# Patient Record
Sex: Female | Born: 1945
Health system: Southern US, Community
[De-identification: ages and names within clinical notes are randomized; demographics above are authoritative.]

## PROBLEM LIST (undated history)

## (undated) DIAGNOSIS — R059 Cough, unspecified: Secondary | ICD-10-CM

## (undated) DIAGNOSIS — Z86718 Personal history of other venous thrombosis and embolism: Secondary | ICD-10-CM

## (undated) DIAGNOSIS — K219 Gastro-esophageal reflux disease without esophagitis: Secondary | ICD-10-CM

## (undated) DIAGNOSIS — I214 Non-ST elevation (NSTEMI) myocardial infarction: Secondary | ICD-10-CM

## (undated) DIAGNOSIS — I2510 Atherosclerotic heart disease of native coronary artery without angina pectoris: Secondary | ICD-10-CM

## (undated) DIAGNOSIS — I200 Unstable angina: Secondary | ICD-10-CM

## (undated) DIAGNOSIS — I10 Essential (primary) hypertension: Secondary | ICD-10-CM

## (undated) DIAGNOSIS — R0600 Dyspnea, unspecified: Secondary | ICD-10-CM

## (undated) DIAGNOSIS — J45909 Unspecified asthma, uncomplicated: Secondary | ICD-10-CM

## (undated) DIAGNOSIS — J449 Chronic obstructive pulmonary disease, unspecified: Secondary | ICD-10-CM

## (undated) DIAGNOSIS — R05 Cough: Secondary | ICD-10-CM

## (undated) HISTORY — PX: ABDOMINAL HYSTERECTOMY: SHX81

---

## 2009-09-26 ENCOUNTER — Emergency Department (HOSPITAL_COMMUNITY): Admission: EM | Admit: 2009-09-26 | Discharge: 2009-09-26 | Payer: Self-pay | Admitting: Emergency Medicine

## 2010-05-15 LAB — CBC
HCT: 46.3 % — ABNORMAL HIGH (ref 36.0–46.0)
Hemoglobin: 15.9 g/dL — ABNORMAL HIGH (ref 12.0–15.0)
MCH: 33.3 pg (ref 26.0–34.0)
MCHC: 34.2 g/dL (ref 30.0–36.0)
MCV: 97.2 fL (ref 78.0–100.0)
Platelets: 217 K/uL (ref 150–400)
RBC: 4.77 MIL/uL (ref 3.87–5.11)
RDW: 13.2 % (ref 11.5–15.5)
WBC: 9.5 10*3/uL (ref 4.0–10.5)

## 2010-05-15 LAB — URINALYSIS, ROUTINE W REFLEX MICROSCOPIC
Bilirubin Urine: NEGATIVE
Glucose, UA: NEGATIVE mg/dL
Nitrite: POSITIVE — AB
Specific Gravity, Urine: >1.03 — ABNORMAL HIGH (ref 1.005–1.030)
Urobilinogen, UA: 0.2 mg/dL (ref 0.0–1.0)
pH: 6 (ref 5.0–8.0)

## 2010-05-15 LAB — DIFFERENTIAL
Basophils Absolute: 0 10*3/uL (ref 0.0–0.1)
Basophils Relative: 0 % (ref 0–1)
Eosinophils Absolute: 0.2 K/uL (ref 0.0–0.7)
Eosinophils Relative: 2 % (ref 0–5)
Lymphocytes Relative: 24 % (ref 12–46)
Lymphs Abs: 2.2 K/uL (ref 0.7–4.0)
Monocytes Absolute: 0.8 10*3/uL (ref 0.1–1.0)
Monocytes Relative: 8 % (ref 3–12)
Neutro Abs: 6.3 K/uL (ref 1.7–7.7)
Neutrophils Relative %: 66 % (ref 43–77)

## 2010-05-15 LAB — POCT I-STAT, CHEM 8
BUN: 14 mg/dL (ref 6–23)
Calcium, Ion: 1.08 mmol/L — ABNORMAL LOW (ref 1.12–1.32)
Chloride: 110 mEq/L (ref 96–112)
Creatinine, Ser: 0.9 mg/dL (ref 0.4–1.2)
Glucose, Bld: 92 mg/dL (ref 70–99)
HCT: 49 % — ABNORMAL HIGH (ref 36.0–46.0)
Hemoglobin: 16.7 g/dL — ABNORMAL HIGH (ref 12.0–15.0)
Potassium: 4 meq/L (ref 3.5–5.1)
Sodium: 143 mEq/L (ref 135–145)
TCO2: 25 mmol/L (ref 0–100)

## 2010-05-15 LAB — URINE CULTURE
Colony Count: >=100000
Culture  Setup Time: 201107302118

## 2010-05-15 LAB — URINE MICROSCOPIC-ADD ON

## 2015-04-20 DIAGNOSIS — Z6828 Body mass index (BMI) 28.0-28.9, adult: Secondary | ICD-10-CM | POA: Diagnosis not present

## 2015-04-20 DIAGNOSIS — K219 Gastro-esophageal reflux disease without esophagitis: Secondary | ICD-10-CM | POA: Diagnosis not present

## 2015-04-20 DIAGNOSIS — Z720 Tobacco use: Secondary | ICD-10-CM | POA: Diagnosis not present

## 2015-04-20 DIAGNOSIS — J45909 Unspecified asthma, uncomplicated: Secondary | ICD-10-CM | POA: Diagnosis not present

## 2015-07-09 DIAGNOSIS — Z7189 Other specified counseling: Secondary | ICD-10-CM | POA: Diagnosis not present

## 2015-07-09 DIAGNOSIS — Z0000 Encounter for general adult medical examination without abnormal findings: Secondary | ICD-10-CM | POA: Diagnosis not present

## 2015-07-09 DIAGNOSIS — F17210 Nicotine dependence, cigarettes, uncomplicated: Secondary | ICD-10-CM | POA: Diagnosis not present

## 2015-07-09 DIAGNOSIS — Z6827 Body mass index (BMI) 27.0-27.9, adult: Secondary | ICD-10-CM | POA: Diagnosis not present

## 2015-07-09 DIAGNOSIS — Z720 Tobacco use: Secondary | ICD-10-CM | POA: Diagnosis not present

## 2015-07-09 DIAGNOSIS — R5383 Other fatigue: Secondary | ICD-10-CM | POA: Diagnosis not present

## 2015-07-09 DIAGNOSIS — Z299 Encounter for prophylactic measures, unspecified: Secondary | ICD-10-CM | POA: Diagnosis not present

## 2015-07-09 DIAGNOSIS — Z1389 Encounter for screening for other disorder: Secondary | ICD-10-CM | POA: Diagnosis not present

## 2015-07-09 DIAGNOSIS — Z79899 Other long term (current) drug therapy: Secondary | ICD-10-CM | POA: Diagnosis not present

## 2015-07-15 DIAGNOSIS — Z299 Encounter for prophylactic measures, unspecified: Secondary | ICD-10-CM | POA: Diagnosis not present

## 2015-07-15 DIAGNOSIS — J449 Chronic obstructive pulmonary disease, unspecified: Secondary | ICD-10-CM | POA: Diagnosis not present

## 2015-07-15 DIAGNOSIS — F17210 Nicotine dependence, cigarettes, uncomplicated: Secondary | ICD-10-CM | POA: Diagnosis not present

## 2015-07-15 DIAGNOSIS — Z720 Tobacco use: Secondary | ICD-10-CM | POA: Diagnosis not present

## 2015-07-15 DIAGNOSIS — E8809 Other disorders of plasma-protein metabolism, not elsewhere classified: Secondary | ICD-10-CM | POA: Diagnosis not present

## 2015-08-17 DIAGNOSIS — J441 Chronic obstructive pulmonary disease with (acute) exacerbation: Secondary | ICD-10-CM | POA: Diagnosis not present

## 2015-08-17 DIAGNOSIS — Z299 Encounter for prophylactic measures, unspecified: Secondary | ICD-10-CM | POA: Diagnosis not present

## 2015-08-17 DIAGNOSIS — B3781 Candidal esophagitis: Secondary | ICD-10-CM | POA: Diagnosis not present

## 2015-08-17 DIAGNOSIS — J449 Chronic obstructive pulmonary disease, unspecified: Secondary | ICD-10-CM | POA: Diagnosis not present

## 2015-08-17 DIAGNOSIS — Z720 Tobacco use: Secondary | ICD-10-CM | POA: Diagnosis not present

## 2015-09-16 DIAGNOSIS — F17210 Nicotine dependence, cigarettes, uncomplicated: Secondary | ICD-10-CM | POA: Diagnosis not present

## 2015-09-16 DIAGNOSIS — Z720 Tobacco use: Secondary | ICD-10-CM | POA: Diagnosis not present

## 2015-09-16 DIAGNOSIS — Z299 Encounter for prophylactic measures, unspecified: Secondary | ICD-10-CM | POA: Diagnosis not present

## 2015-09-16 DIAGNOSIS — J449 Chronic obstructive pulmonary disease, unspecified: Secondary | ICD-10-CM | POA: Diagnosis not present

## 2015-11-27 ENCOUNTER — Encounter (HOSPITAL_COMMUNITY): Payer: Self-pay | Admitting: Emergency Medicine

## 2015-11-27 ENCOUNTER — Emergency Department (HOSPITAL_COMMUNITY)
Admission: EM | Admit: 2015-11-27 | Discharge: 2015-11-27 | Disposition: A | Discharge status: Home / Self Care | Payer: MEDICARE | Attending: Emergency Medicine | Admitting: Emergency Medicine

## 2015-11-27 DIAGNOSIS — T63441A Toxic effect of venom of bees, accidental (unintentional), initial encounter: Secondary | ICD-10-CM | POA: Diagnosis not present

## 2015-11-27 DIAGNOSIS — F17210 Nicotine dependence, cigarettes, uncomplicated: Secondary | ICD-10-CM | POA: Diagnosis not present

## 2015-11-27 DIAGNOSIS — T63481A Toxic effect of venom of other arthropod, accidental (unintentional), initial encounter: Secondary | ICD-10-CM

## 2015-11-27 MED ORDER — HYDROXYZINE HCL 25 MG PO TABS: 25.0000 mg | ORAL_TABLET | Freq: Four times a day (QID) | ORAL | 0 refills | Status: DC | PRN

## 2015-11-27 NOTE — ED Triage Notes (Signed)
Pt states she got an insect bite while working in the garden yesterday. Area was approx quarter size last night. Redness has spread today and pt is c/o lightheadedness and nausea.

## 2015-11-27 NOTE — ED Provider Notes (Signed)
[facility]-EM[facility]Y DEPT Provider Note   CSN: [id number] Arrival date & time: 11/27/15  2300  By signing my name below, I, Linna Darner, attest that this documentation has been prepared under the direction and in the presence of physician practitioner, Gilda Crease, MD. Electronically Signed: Linna Darner, Scribe. 11/27/2015. 11:16 PM.  History   Chief Complaint Chief Complaint  Patient presents with  . Insect Bite    The history is provided by the patient. No language interpreter was used.     HPI Comments: Lisa [name] is a 70 y.o. female who presents to the Emergency Department complaining of insect bite sustained to her right lower abdomen yesterday. Pt reports she was working in her garden and felt an insect bite on her right lower abdomen. She states she did not see the insect. Pt reports she initially had redness that was about the size of a quarter that has spread significantly since onset. She states the area is painful to the touch. Pt reports she has tried benadryl, peroxide, and an antibiotic cream with no relief of her symptoms. She reports a h/o swelling after being stung by yellow jackets in the past but has no other known allergic reactions to insects. Pt denies SOB, numbness, weakness, or any other associated symptoms.  History reviewed. No pertinent past medical history.  There are no active problems to display for this patient.   Past Surgical History:  Procedure Laterality Date  . ABDOMINAL HYSTERECTOMY      OB History    Gravida Para Term Preterm AB Living   7 6 6   1 4    SAB TAB Ectopic Multiple Live Births   1               Home Medications    Prior to Admission medications   Medication Sig Start Date End Date Taking? Authorizing Provider  hydrOXYzine (ATARAX/VISTARIL) 25 MG tablet Take 1 tablet (25 mg total) by mouth every 6 (six) hours as needed for itching. 11/27/15   Gilda Crease, MD    Family History Family History    Problem Relation Age of Onset  . Asthma Mother   . Heart attack Father   . Cancer Other     Social History Social History  Substance Use Topics  . Smoking status: Current Every Day Smoker    Packs/day: 0.50    Types: Cigarettes  . Smokeless tobacco: Never Used  . Alcohol use Yes     Comment: occas     Allergies   Penicillins and Sulfa antibiotics   Review of Systems Review of Systems  Respiratory: Negative for shortness of breath.   Skin: Positive for color change (redness) and wound (insect bite to right abdomen).  Neurological: Negative for weakness and numbness.  All other systems reviewed and are negative.   Physical Exam Updated Vital Signs BP (!) 208/98 (BP Location: Right Arm)   Pulse 68   Temp 97.7 F (36.5 C) (Oral)   Resp 18   Ht 5\' 7"  (1.702 m)   Wt 171 lb (77.6 kg)   SpO2 96%   BMI 26.78 kg/m   Physical Exam  Constitutional: She is oriented to person, place, and time. She appears well-developed and well-nourished. No distress.  HENT:  Head: Normocephalic and atraumatic.  Right Ear: Hearing normal.  Left Ear: Hearing normal.  Nose: Nose normal.  Mouth/Throat: Oropharynx is clear and moist and mucous membranes are normal.  Eyes: Conjunctivae and EOM are normal. Pupils are equal,  round, and reactive to light.  Neck: Normal range of motion. Neck supple.  Cardiovascular: Regular rhythm, S1 normal and S2 normal.  Exam reveals no gallop and no friction rub.   No murmur heard. Pulmonary/Chest: Effort normal and breath sounds normal. No respiratory distress. She exhibits no tenderness.  Abdominal: Soft. Normal appearance and bowel sounds are normal. There is no hepatosplenomegaly. There is no tenderness. There is no rebound, no guarding, no tenderness at McBurney's point and negative Murphy's sign. No hernia.  Musculoskeletal: Normal range of motion.  Neurological: She is alert and oriented to person, place, and time. She has normal strength. No cranial  nerve deficit or sensory deficit. Coordination normal. GCS eye subscore is 4. GCS verbal subscore is 5. GCS motor subscore is 6.  Skin: Skin is warm, dry and intact. No rash noted. No cyanosis.  5 x 8 cm oval-shaped, well circumscribed, non-raised area of erythema and warmth to the right mid-abdomen.  Psychiatric: She has a normal mood and affect. Her speech is normal and behavior is normal. Thought content normal.  Nursing note and vitals reviewed.    ED Treatments / Results  Labs (all labs ordered are listed, but only abnormal results are displayed) Labs Reviewed - No data to display  EKG  EKG Interpretation None       Radiology No results found.  Procedures Procedures (including critical care time)  DIAGNOSTIC STUDIES: Oxygen Saturation is 96% on RA, adequate by my interpretation.    COORDINATION OF CARE: 11:20 PM Discussed treatment plan with pt at bedside and pt agreed to plan.  Medications Ordered in ED Medications - No data to display   Initial Impression / Assessment and Plan / ED Course  I have reviewed the triage vital signs and the nursing notes.  Pertinent labs & imaging results that were available during my care of the patient were reviewed by me and considered in my medical decision making (see chart for details).  Clinical Course   Patient presents to the ER for evaluation of possible bug bite or sting. She reports that she was working in the garden felt a sudden burning pain in her right side. There was a small bump there and immediately but since then she has had expanding redness around the site. Examination reveals some erythema and warmth to the site that is consistent with a local reaction. This does not resemble cellulitis. Patient reassured, topical care and Benadryl or Vistaril for itching.  Patient was noted to be hypertensive here. She reports that her primary doctor is watching this. She is keeping a daily journal of her blood pressures. She  reports that her pressure goes up when she is under stress. She is stressed about this issue today, but also lost her grandson earlier in the week and this is causing her great stress as well. She is not symptomatically therefore no initiation of treatment will be made. She is to follow-up with her primary doctor as scheduled for recheck of her pressures.  I personally performed the services described in this documentation, which was scribed in my presence. The recorded information has been reviewed and is accurate.   Final Clinical Impressions(s) / ED Diagnoses   Final diagnoses:  Insect sting, accidental or unintentional, initial encounter    New Prescriptions New Prescriptions   HYDROXYZINE (ATARAX/VISTARIL) 25 MG TABLET    Take 1 tablet (25 mg total) by mouth every 6 (six) hours as needed for itching.     Gilda Creasehristopher J Jahsir Rama, MD  11/27/15 2334  

## 2015-12-25 DIAGNOSIS — J449 Chronic obstructive pulmonary disease, unspecified: Secondary | ICD-10-CM | POA: Diagnosis not present

## 2015-12-25 DIAGNOSIS — Z23 Encounter for immunization: Secondary | ICD-10-CM | POA: Diagnosis not present

## 2015-12-25 DIAGNOSIS — Z720 Tobacco use: Secondary | ICD-10-CM | POA: Diagnosis not present

## 2015-12-25 DIAGNOSIS — F17200 Nicotine dependence, unspecified, uncomplicated: Secondary | ICD-10-CM | POA: Diagnosis not present

## 2016-01-13 DIAGNOSIS — Z299 Encounter for prophylactic measures, unspecified: Secondary | ICD-10-CM | POA: Diagnosis not present

## 2016-01-13 DIAGNOSIS — J449 Chronic obstructive pulmonary disease, unspecified: Secondary | ICD-10-CM | POA: Diagnosis not present

## 2016-01-13 DIAGNOSIS — J441 Chronic obstructive pulmonary disease with (acute) exacerbation: Secondary | ICD-10-CM | POA: Diagnosis not present

## 2016-08-17 DIAGNOSIS — F17210 Nicotine dependence, cigarettes, uncomplicated: Secondary | ICD-10-CM | POA: Diagnosis not present

## 2016-08-17 DIAGNOSIS — J449 Chronic obstructive pulmonary disease, unspecified: Secondary | ICD-10-CM | POA: Diagnosis not present

## 2016-08-17 DIAGNOSIS — Z6828 Body mass index (BMI) 28.0-28.9, adult: Secondary | ICD-10-CM | POA: Diagnosis not present

## 2016-08-17 DIAGNOSIS — Z299 Encounter for prophylactic measures, unspecified: Secondary | ICD-10-CM | POA: Diagnosis not present

## 2016-08-17 DIAGNOSIS — K589 Irritable bowel syndrome without diarrhea: Secondary | ICD-10-CM | POA: Diagnosis not present

## 2016-08-23 DIAGNOSIS — M549 Dorsalgia, unspecified: Secondary | ICD-10-CM | POA: Diagnosis not present

## 2016-08-23 DIAGNOSIS — F17200 Nicotine dependence, unspecified, uncomplicated: Secondary | ICD-10-CM | POA: Diagnosis not present

## 2016-08-23 DIAGNOSIS — Z79899 Other long term (current) drug therapy: Secondary | ICD-10-CM | POA: Diagnosis not present

## 2016-08-23 DIAGNOSIS — Z801 Family history of malignant neoplasm of trachea, bronchus and lung: Secondary | ICD-10-CM | POA: Diagnosis not present

## 2016-08-23 DIAGNOSIS — K589 Irritable bowel syndrome without diarrhea: Secondary | ICD-10-CM | POA: Diagnosis not present

## 2016-08-23 DIAGNOSIS — R202 Paresthesia of skin: Secondary | ICD-10-CM | POA: Diagnosis not present

## 2016-08-23 DIAGNOSIS — Z880 Allergy status to penicillin: Secondary | ICD-10-CM | POA: Diagnosis not present

## 2016-08-23 DIAGNOSIS — Z883 Allergy status to other anti-infective agents status: Secondary | ICD-10-CM | POA: Diagnosis not present

## 2016-08-23 DIAGNOSIS — R0789 Other chest pain: Secondary | ICD-10-CM | POA: Diagnosis not present

## 2016-08-23 DIAGNOSIS — Z882 Allergy status to sulfonamides status: Secondary | ICD-10-CM | POA: Diagnosis not present

## 2016-08-23 DIAGNOSIS — K219 Gastro-esophageal reflux disease without esophagitis: Secondary | ICD-10-CM | POA: Diagnosis not present

## 2016-08-23 DIAGNOSIS — R079 Chest pain, unspecified: Secondary | ICD-10-CM | POA: Diagnosis not present

## 2016-08-23 DIAGNOSIS — Z8249 Family history of ischemic heart disease and other diseases of the circulatory system: Secondary | ICD-10-CM | POA: Diagnosis not present

## 2016-08-23 DIAGNOSIS — Z299 Encounter for prophylactic measures, unspecified: Secondary | ICD-10-CM | POA: Diagnosis not present

## 2016-08-23 DIAGNOSIS — R61 Generalized hyperhidrosis: Secondary | ICD-10-CM | POA: Diagnosis not present

## 2016-08-23 DIAGNOSIS — J449 Chronic obstructive pulmonary disease, unspecified: Secondary | ICD-10-CM | POA: Diagnosis not present

## 2016-08-23 DIAGNOSIS — R110 Nausea: Secondary | ICD-10-CM | POA: Diagnosis not present

## 2016-08-23 DIAGNOSIS — Z7951 Long term (current) use of inhaled steroids: Secondary | ICD-10-CM | POA: Diagnosis not present

## 2016-08-23 DIAGNOSIS — Z6828 Body mass index (BMI) 28.0-28.9, adult: Secondary | ICD-10-CM | POA: Diagnosis not present

## 2016-08-24 DIAGNOSIS — R0789 Other chest pain: Secondary | ICD-10-CM | POA: Diagnosis not present

## 2016-08-24 DIAGNOSIS — R110 Nausea: Secondary | ICD-10-CM | POA: Diagnosis not present

## 2016-08-24 DIAGNOSIS — R202 Paresthesia of skin: Secondary | ICD-10-CM | POA: Diagnosis not present

## 2016-08-24 DIAGNOSIS — M549 Dorsalgia, unspecified: Secondary | ICD-10-CM | POA: Diagnosis not present

## 2016-08-24 DIAGNOSIS — R61 Generalized hyperhidrosis: Secondary | ICD-10-CM | POA: Diagnosis not present

## 2016-08-24 DIAGNOSIS — F17200 Nicotine dependence, unspecified, uncomplicated: Secondary | ICD-10-CM | POA: Diagnosis not present

## 2016-08-25 DIAGNOSIS — M549 Dorsalgia, unspecified: Secondary | ICD-10-CM | POA: Diagnosis not present

## 2016-08-25 DIAGNOSIS — R202 Paresthesia of skin: Secondary | ICD-10-CM | POA: Diagnosis not present

## 2016-08-25 DIAGNOSIS — R0789 Other chest pain: Secondary | ICD-10-CM | POA: Diagnosis not present

## 2016-08-25 DIAGNOSIS — R110 Nausea: Secondary | ICD-10-CM | POA: Diagnosis not present

## 2016-08-25 DIAGNOSIS — R61 Generalized hyperhidrosis: Secondary | ICD-10-CM | POA: Diagnosis not present

## 2016-08-25 DIAGNOSIS — F17200 Nicotine dependence, unspecified, uncomplicated: Secondary | ICD-10-CM | POA: Diagnosis not present

## 2016-08-28 DIAGNOSIS — I200 Unstable angina: Secondary | ICD-10-CM

## 2016-08-28 HISTORY — DX: Unstable angina: I20.0

## 2016-08-31 ENCOUNTER — Inpatient Hospital Stay (HOSPITAL_COMMUNITY)
Admission: AD | Admit: 2016-08-31 | Discharge: 2016-09-06 | DRG: 247 | Disposition: A | Discharge status: Home / Self Care | Payer: MEDICARE | Source: Other Acute Inpatient Hospital | Attending: Internal Medicine | Admitting: Internal Medicine

## 2016-08-31 DIAGNOSIS — F17210 Nicotine dependence, cigarettes, uncomplicated: Secondary | ICD-10-CM | POA: Diagnosis not present

## 2016-08-31 DIAGNOSIS — Z882 Allergy status to sulfonamides status: Secondary | ICD-10-CM

## 2016-08-31 DIAGNOSIS — F17200 Nicotine dependence, unspecified, uncomplicated: Secondary | ICD-10-CM | POA: Diagnosis not present

## 2016-08-31 DIAGNOSIS — I25110 Atherosclerotic heart disease of native coronary artery with unstable angina pectoris: Secondary | ICD-10-CM | POA: Diagnosis present

## 2016-08-31 DIAGNOSIS — Z720 Tobacco use: Secondary | ICD-10-CM | POA: Diagnosis not present

## 2016-08-31 DIAGNOSIS — I200 Unstable angina: Secondary | ICD-10-CM

## 2016-08-31 DIAGNOSIS — Z955 Presence of coronary angioplasty implant and graft: Secondary | ICD-10-CM

## 2016-08-31 DIAGNOSIS — R053 Chronic cough: Secondary | ICD-10-CM | POA: Diagnosis present

## 2016-08-31 DIAGNOSIS — R0789 Other chest pain: Secondary | ICD-10-CM | POA: Diagnosis not present

## 2016-08-31 DIAGNOSIS — K219 Gastro-esophageal reflux disease without esophagitis: Secondary | ICD-10-CM | POA: Diagnosis not present

## 2016-08-31 DIAGNOSIS — Z7982 Long term (current) use of aspirin: Secondary | ICD-10-CM

## 2016-08-31 DIAGNOSIS — J439 Emphysema, unspecified: Secondary | ICD-10-CM | POA: Diagnosis not present

## 2016-08-31 DIAGNOSIS — R079 Chest pain, unspecified: Secondary | ICD-10-CM

## 2016-08-31 DIAGNOSIS — I214 Non-ST elevation (NSTEMI) myocardial infarction: Secondary | ICD-10-CM | POA: Diagnosis not present

## 2016-08-31 DIAGNOSIS — R0602 Shortness of breath: Secondary | ICD-10-CM | POA: Diagnosis not present

## 2016-08-31 DIAGNOSIS — J449 Chronic obstructive pulmonary disease, unspecified: Secondary | ICD-10-CM | POA: Diagnosis not present

## 2016-08-31 DIAGNOSIS — E876 Hypokalemia: Secondary | ICD-10-CM

## 2016-08-31 DIAGNOSIS — R062 Wheezing: Secondary | ICD-10-CM | POA: Diagnosis not present

## 2016-08-31 DIAGNOSIS — Z79899 Other long term (current) drug therapy: Secondary | ICD-10-CM | POA: Diagnosis not present

## 2016-08-31 DIAGNOSIS — R05 Cough: Secondary | ICD-10-CM | POA: Diagnosis present

## 2016-08-31 DIAGNOSIS — Z880 Allergy status to penicillin: Secondary | ICD-10-CM

## 2016-08-31 DIAGNOSIS — E785 Hyperlipidemia, unspecified: Secondary | ICD-10-CM | POA: Diagnosis not present

## 2016-08-31 HISTORY — DX: Dyspnea, unspecified: R06.00

## 2016-08-31 HISTORY — DX: Cough: R05

## 2016-08-31 HISTORY — DX: Unstable angina: I20.0

## 2016-08-31 HISTORY — DX: Chronic obstructive pulmonary disease, unspecified: J44.9

## 2016-08-31 HISTORY — DX: Non-ST elevation (NSTEMI) myocardial infarction: I21.4

## 2016-08-31 HISTORY — DX: Cough, unspecified: R05.9

## 2016-08-31 HISTORY — DX: Atherosclerotic heart disease of native coronary artery without angina pectoris: I25.10

## 2016-08-31 HISTORY — DX: Gastro-esophageal reflux disease without esophagitis: K21.9

## 2016-08-31 MED ORDER — ASPIRIN EC 81 MG PO TBEC: 81.0000 mg | DELAYED_RELEASE_TABLET | Freq: Every day | ORAL | Status: DC

## 2016-08-31 MED ORDER — ATORVASTATIN CALCIUM 80 MG PO TABS
80.0000 mg | ORAL_TABLET | Freq: Every day | ORAL | Status: DC
  Administered 2016-09-01 – 2016-09-05 (×5): 80 mg via ORAL
  Filled 2016-08-31 (×5): qty 1

## 2016-08-31 MED ORDER — SODIUM CHLORIDE 0.9% FLUSH
3.0000 mL | Freq: Two times a day (BID) | INTRAVENOUS | Status: DC
  Administered 2016-09-01 – 2016-09-04 (×5): 3 mL via INTRAVENOUS

## 2016-08-31 NOTE — H&P (Signed)
CARDIOLOGY INPATIENT HISTORY AND PHYSICAL EXAMINATION NOTE  Patient ID: Lisa [name] MRN: [id number], DOB/AGE: January 25, 1946   Admit date: 08/31/2016   Primary Physician: Ignatius Specking, MD Primary Cardiologist: new  Reason for admission: chest pain  HPI: This is a 72 y.o. Caucasian female with no known history of CAD but has h/o COPD and tobacco use 60 pack years who presented with chest pain to [facility] hospital. Patient was in his usual state of health when she developed chest pain while she was working outside in the year 1 week ago. She was sent by her PCP to ED where she was ruled out for ACS. She was scheduled for stress test on coming Monday however she had 2 more episodes of chest pains at home and today she presented again to [facility]. The stress machine at [facility] is out of work so they transferred the patient to [facility] for stress test in the AM.  CP lasted for 45 minutes.substernal in location, no radiation, 10/10 in intensity, occurred at rest this time right after eating lunch around 2 pm today. Initial trop was negative. ECG showed T wave inversions in the V1-V3. Pt was hemodynamically stable. No h/o PE/DVT, nausea/vomiting, abdominal pain, CHF symptoms.   Problem List: No past medical history on file.  Past Surgical History:  Procedure Laterality Date  . ABDOMINAL HYSTERECTOMY       Allergies:  Allergies  Allergen Reactions  . Penicillins Anaphylaxis  . Sulfa Antibiotics Other (See Comments)    Hands and feet blistered     Home Medications Current Facility-Administered Medications  Medication Dose Route Frequency Provider Last Rate Last Dose  . albuterol (PROVENTIL) (2.5 MG/3ML) 0.083% nebulizer solution 2.5 mg  2.5 mg Nebulization Q4H PRN Timoteo Expose T, MD      . aspirin EC tablet 81 mg  81 mg Oral Daily Timoteo Expose T, MD      . atorvastatin (LIPITOR) tablet 80 mg  80 mg Oral q1800 Timoteo Expose T, MD      . heparin ADULT infusion 100  units/mL (25000 units/234mL sodium chloride 0.45%)  1,000 Units/hr Intravenous Continuous Juliette Mangle, RPH 10 mL/hr at 09/01/16 0019 1,000 Units/hr at 09/01/16 0019  . metoprolol tartrate (LOPRESSOR) tablet 12.5 mg  12.5 mg Oral BID Timoteo Expose T, MD      . sodium chloride flush (NS) 0.9 % injection 3 mL  3 mL Intravenous Q12H Timoteo Expose T, MD   3 mL at 09/01/16 9562     Family History  Problem Relation Age of Onset  . Asthma Mother   . Heart attack Father   . Cancer Other      Social History   Social History  . Marital status: Divorced    Spouse name: N/A  . Number of children: N/A  . Years of education: N/A   Occupational History  . Not on file.   Social History Main Topics  . Smoking status: Current Every Day Smoker    Packs/day: 0.50    Types: Cigarettes  . Smokeless tobacco: Never Used  . Alcohol use Yes     Comment: occas  . Drug use: No  . Sexual activity: Not on file   Other Topics Concern  . Not on file   Social History Narrative  . No narrative on file     Review of Systems: General: negative for chills, fever, night sweats or weight changes.  Cardiovascular: chest pain, dyspnea negative for dyspnea on exertion, edema, orthopnea,  palpitations, paroxysmal nocturnal dyspnea  Dermatological: negative for rash Respiratory: negative for cough or wheezing Urologic: negative for hematuria Abdominal: negative for nausea, vomiting, diarrhea, bright red blood per rectum, melena, or hematemesis Neurologic: negative for visual changes, syncope, or dizziness Endocrine: no diabetes, no hypothyroidism Immunological: no lymph adenopathy Psych: non homicidal/suicidal  Physical Exam: Vitals: BP 137/63 (BP Location: Left Arm)   Pulse (!) 57   Temp 98.2 F (36.8 C) (Oral)   Resp 18   Ht 5\' 7"  (1.702 m)   Wt 79.6 kg (175 lb 9.5 oz)   LMP 09/01/2016   SpO2 91%   BMI 27.50 kg/m  General: not in acute distress Neck: JVP flat, neck supple Heart: regular  rate and rhythm, S1, S2, no murmurs  Lungs: prolonged expiratory phase, barrel shaped chest from COPD changes GI: non tender, non distended, bowel sounds present Extremities: no edema Neuro: AAO x 3  Psych: normal affect, no anxiety   Labs:   Results for orders placed or performed during the hospital encounter of 08/31/16 (from the past 24 hour(s))  Brain natriuretic peptide     Status: None   Collection Time: 09/01/16 12:24 AM  Result Value Ref Range   B Natriuretic Peptide 86.2 0.0 - 100.0 pg/mL  TSH     Status: Abnormal   Collection Time: 09/01/16 12:24 AM  Result Value Ref Range   TSH 4.634 (H) 0.350 - 4.500 uIU/mL  Troponin I     Status: Abnormal   Collection Time: 09/01/16 12:24 AM  Result Value Ref Range   Troponin I 0.05 (HH) <0.03 ng/mL  Lipid panel     Status: None   Collection Time: 09/01/16 12:24 AM  Result Value Ref Range   Cholesterol 148 0 - 200 mg/dL   Triglycerides 88 <086 mg/dL   HDL 41 >57 mg/dL   Total CHOL/HDL Ratio 3.6 RATIO   VLDL 18 0 - 40 mg/dL   LDL Cholesterol 89 0 - 99 mg/dL  Troponin I     Status: Abnormal   Collection Time: 09/01/16  5:36 AM  Result Value Ref Range   Troponin I 0.07 (HH) <0.03 ng/mL  Comprehensive metabolic panel     Status: Abnormal   Collection Time: 09/01/16  5:36 AM  Result Value Ref Range   Sodium 142 135 - 145 mmol/L   Potassium 3.8 3.5 - 5.1 mmol/L   Chloride 108 101 - 111 mmol/L   CO2 26 22 - 32 mmol/L   Glucose, Bld 97 65 - 99 mg/dL   BUN 15 6 - 20 mg/dL   Creatinine, Ser 8.46 0.44 - 1.00 mg/dL   Calcium 8.5 (L) 8.9 - 10.3 mg/dL   Total Protein 6.2 (L) 6.5 - 8.1 g/dL   Albumin 3.4 (L) 3.5 - 5.0 g/dL   AST 23 15 - 41 U/L   ALT 25 14 - 54 U/L   Alkaline Phosphatase 74 38 - 126 U/L   Total Bilirubin 0.8 0.3 - 1.2 mg/dL   GFR calc non Af Amer >60 >60 mL/min   GFR calc Af Amer >60 >60 mL/min   Anion gap 8 5 - 15  CBC     Status: None   Collection Time: 09/01/16  5:36 AM  Result Value Ref Range   WBC 8.2 4.0  - 10.5 K/uL   RBC 4.73 3.87 - 5.11 MIL/uL   Hemoglobin 14.6 12.0 - 15.0 g/dL   HCT 96.2 95.2 - 84.1 %   MCV 97.0 78.0 - 100.0  fL   MCH 30.9 26.0 - 34.0 pg   MCHC 31.8 30.0 - 36.0 g/dL   RDW 16.114.0 09.611.5 - 04.515.5 %   Platelets 184 150 - 400 K/uL  Protime-INR     Status: None   Collection Time: 09/01/16  5:36 AM  Result Value Ref Range   Prothrombin Time 13.9 11.4 - 15.2 seconds   INR 1.07   Heparin level (unfractionated)     Status: None   Collection Time: 09/01/16  8:18 AM  Result Value Ref Range   Heparin Unfractionated 0.33 0.30 - 0.70 IU/mL     Radiology/Studies: No results found.    Medical decision making:  Discussed care with the patient and daughter Discussed care with the ED physician assistant on the phone Reviewed labs and imaging personally Reviewed prior records  ASSESSMENT AND PLAN:  This is a 71 y.o. female without known CAD who presented with chest pain.   Principal Problem:   Unstable angina (HCC) Active Problems:   GERD (gastroesophageal reflux disease)   COPD (chronic obstructive pulmonary disease) (HCC)   Tobacco abuse   Chronic cough   Unstable angina  increasing episodes of chest pain, recent at rest pain, EKG with T wave inversions - admit to telemetry floor  - recommend IV heparin - cycle troponin - echocardiogram in the AM - NPO post midnight - CBC, CMP, INR/PTT - TSH, HbA1c, lipid panel - consider cath vs. Stress test in AM - started on metoprolol 12.5 mg BID    Tobacco abuse Counseled for tobacco use cessation  COPD, not in exacerbation emphysema - inhalers prn not on home inhalers  Chronic cough - treat as needed  GERD - protonix     Signed, Joellyn RuedQURESHI, WAQAS T, MD MS 09/01/2016, 12:19 PM

## 2016-09-01 ENCOUNTER — Observation Stay (HOSPITAL_COMMUNITY): Payer: MEDICARE

## 2016-09-01 ENCOUNTER — Encounter (HOSPITAL_COMMUNITY): Payer: Self-pay | Admitting: General Practice

## 2016-09-01 DIAGNOSIS — E785 Hyperlipidemia, unspecified: Secondary | ICD-10-CM | POA: Diagnosis not present

## 2016-09-01 DIAGNOSIS — Z882 Allergy status to sulfonamides status: Secondary | ICD-10-CM | POA: Diagnosis not present

## 2016-09-01 DIAGNOSIS — Z7982 Long term (current) use of aspirin: Secondary | ICD-10-CM | POA: Diagnosis not present

## 2016-09-01 DIAGNOSIS — I214 Non-ST elevation (NSTEMI) myocardial infarction: Secondary | ICD-10-CM | POA: Diagnosis not present

## 2016-09-01 DIAGNOSIS — J449 Chronic obstructive pulmonary disease, unspecified: Secondary | ICD-10-CM | POA: Diagnosis present

## 2016-09-01 DIAGNOSIS — E876 Hypokalemia: Secondary | ICD-10-CM | POA: Diagnosis not present

## 2016-09-01 DIAGNOSIS — F17210 Nicotine dependence, cigarettes, uncomplicated: Secondary | ICD-10-CM | POA: Diagnosis present

## 2016-09-01 DIAGNOSIS — R079 Chest pain, unspecified: Secondary | ICD-10-CM | POA: Diagnosis not present

## 2016-09-01 DIAGNOSIS — I2510 Atherosclerotic heart disease of native coronary artery without angina pectoris: Secondary | ICD-10-CM | POA: Diagnosis not present

## 2016-09-01 DIAGNOSIS — I200 Unstable angina: Secondary | ICD-10-CM | POA: Diagnosis not present

## 2016-09-01 DIAGNOSIS — I340 Nonrheumatic mitral (valve) insufficiency: Secondary | ICD-10-CM | POA: Diagnosis not present

## 2016-09-01 DIAGNOSIS — Z880 Allergy status to penicillin: Secondary | ICD-10-CM | POA: Diagnosis not present

## 2016-09-01 DIAGNOSIS — I25110 Atherosclerotic heart disease of native coronary artery with unstable angina pectoris: Secondary | ICD-10-CM | POA: Diagnosis not present

## 2016-09-01 DIAGNOSIS — Z720 Tobacco use: Secondary | ICD-10-CM | POA: Diagnosis not present

## 2016-09-01 DIAGNOSIS — K219 Gastro-esophageal reflux disease without esophagitis: Secondary | ICD-10-CM | POA: Diagnosis present

## 2016-09-01 DIAGNOSIS — I2583 Coronary atherosclerosis due to lipid rich plaque: Secondary | ICD-10-CM | POA: Diagnosis not present

## 2016-09-01 HISTORY — DX: Non-ST elevation (NSTEMI) myocardial infarction: I21.4

## 2016-09-01 LAB — CBC
HCT: 45.9 % (ref 36.0–46.0)
Hemoglobin: 14.6 g/dL (ref 12.0–15.0)
MCH: 30.9 pg (ref 26.0–34.0)
MCHC: 31.8 g/dL (ref 30.0–36.0)
MCV: 97 fL (ref 78.0–100.0)
PLATELETS: 184 10*3/uL (ref 150–400)
RBC: 4.73 MIL/uL (ref 3.87–5.11)
RDW: 14 % (ref 11.5–15.5)
WBC: 8.2 10*3/uL (ref 4.0–10.5)

## 2016-09-01 LAB — NM MYOCAR MULTI W/SPECT W/WALL MOTION / EF
CHL CUP RESTING HR STRESS: 57 {beats}/min
CSEPEW: 1 METS
Exercise duration (min): 5 min
Exercise duration (sec): 1 s
Peak HR: 95 {beats}/min

## 2016-09-01 LAB — COMPREHENSIVE METABOLIC PANEL
ALK PHOS: 74 U/L (ref 38–126)
ALT: 25 U/L (ref 14–54)
AST: 23 U/L (ref 15–41)
Albumin: 3.4 g/dL — ABNORMAL LOW (ref 3.5–5.0)
Anion gap: 8 (ref 5–15)
BILIRUBIN TOTAL: 0.8 mg/dL (ref 0.3–1.2)
BUN: 15 mg/dL (ref 6–20)
CALCIUM: 8.5 mg/dL — AB (ref 8.9–10.3)
CO2: 26 mmol/L (ref 22–32)
CREATININE: 0.82 mg/dL (ref 0.44–1.00)
Chloride: 108 mmol/L (ref 101–111)
GFR calc Af Amer: >60 mL/min (ref >60)
Glucose, Bld: 97 mg/dL (ref 65–99)
Potassium: 3.8 mmol/L (ref 3.5–5.1)
Sodium: 142 mmol/L (ref 135–145)
TOTAL PROTEIN: 6.2 g/dL — AB (ref 6.5–8.1)

## 2016-09-01 LAB — HEPARIN LEVEL (UNFRACTIONATED)
HEPARIN UNFRACTIONATED: 0.33 [IU]/mL (ref 0.30–0.70)
Heparin Unfractionated: 0.33 IU/mL (ref 0.30–0.70)

## 2016-09-01 LAB — PROTIME-INR
INR: 1.07
PROTHROMBIN TIME: 13.9 s (ref 11.4–15.2)

## 2016-09-01 LAB — LIPID PANEL
CHOLESTEROL: 148 mg/dL (ref 0–200)
HDL: 41 mg/dL (ref >40)
LDL Cholesterol: 89 mg/dL (ref 0–99)
Total CHOL/HDL Ratio: 3.6 RATIO
Triglycerides: 88 mg/dL (ref <150)
VLDL: 18 mg/dL (ref 0–40)

## 2016-09-01 LAB — TROPONIN I
Troponin I: 0.05 ng/mL (ref <0.03)
Troponin I: 0.07 ng/mL (ref <0.03)
Troponin I: 0.06 ng/mL (ref <0.03)

## 2016-09-01 LAB — BRAIN NATRIURETIC PEPTIDE: B NATRIURETIC PEPTIDE 5: 86.2 pg/mL (ref 0.0–100.0)

## 2016-09-01 LAB — TSH: TSH: 4.634 u[IU]/mL — AB (ref 0.350–4.500)

## 2016-09-01 MED ORDER — SODIUM CHLORIDE 0.9% FLUSH: 3.0000 mL | INTRAVENOUS | Status: DC | PRN

## 2016-09-01 MED ORDER — ASPIRIN 81 MG PO CHEW
81.0000 mg | CHEWABLE_TABLET | ORAL | Status: AC
  Administered 2016-09-02: 81 mg via ORAL
  Filled 2016-09-01: qty 1

## 2016-09-01 MED ORDER — ASPIRIN EC 81 MG PO TBEC
81.0000 mg | DELAYED_RELEASE_TABLET | Freq: Every day | ORAL | Status: DC
  Administered 2016-09-04 – 2016-09-06 (×2): 81 mg via ORAL
  Filled 2016-09-01 (×2): qty 1

## 2016-09-01 MED ORDER — METOPROLOL TARTRATE 12.5 MG HALF TABLET
12.5000 mg | ORAL_TABLET | Freq: Two times a day (BID) | ORAL | Status: DC
  Administered 2016-09-01 – 2016-09-06 (×3): 12.5 mg via ORAL
  Filled 2016-09-01 (×7): qty 1

## 2016-09-01 MED ORDER — SODIUM CHLORIDE 0.9 % WEIGHT BASED INFUSION: 1.0000 mL/kg/h | INTRAVENOUS | Status: DC

## 2016-09-01 MED ORDER — HEPARIN BOLUS VIA INFUSION
4000.0000 [IU] | Freq: Once | INTRAVENOUS | Status: AC
  Administered 2016-09-01: 4000 [IU] via INTRAVENOUS
  Filled 2016-09-01: qty 4000

## 2016-09-01 MED ORDER — PANTOPRAZOLE SODIUM 40 MG PO TBEC
40.0000 mg | DELAYED_RELEASE_TABLET | Freq: Every day | ORAL | Status: DC
  Administered 2016-09-01 – 2016-09-06 (×6): 40 mg via ORAL
  Filled 2016-09-01 (×6): qty 1

## 2016-09-01 MED ORDER — SODIUM CHLORIDE 0.9 % IV SOLN: 250.0000 mL | INTRAVENOUS | Status: DC | PRN

## 2016-09-01 MED ORDER — SODIUM CHLORIDE 0.9% FLUSH: 3.0000 mL | Freq: Two times a day (BID) | INTRAVENOUS | Status: DC

## 2016-09-01 MED ORDER — REGADENOSON 0.4 MG/5ML IV SOLN
INTRAVENOUS | Status: AC
  Filled 2016-09-01: qty 5

## 2016-09-01 MED ORDER — ALBUTEROL SULFATE (2.5 MG/3ML) 0.083% IN NEBU
2.5000 mg | INHALATION_SOLUTION | RESPIRATORY_TRACT | Status: DC | PRN
  Administered 2016-09-01 – 2016-09-02 (×2): 2.5 mg via RESPIRATORY_TRACT
  Filled 2016-09-01 (×3): qty 3

## 2016-09-01 MED ORDER — SODIUM CHLORIDE 0.9 % WEIGHT BASED INFUSION
3.0000 mL/kg/h | INTRAVENOUS | Status: DC
  Administered 2016-09-02: 3 mL/kg/h via INTRAVENOUS

## 2016-09-01 MED ORDER — HEPARIN (PORCINE) IN NACL 100-0.45 UNIT/ML-% IJ SOLN
1000.0000 [IU]/h | INTRAMUSCULAR | Status: DC
  Administered 2016-09-01 (×2): 1000 [IU]/h via INTRAVENOUS
  Filled 2016-09-01 (×2): qty 250

## 2016-09-01 MED ORDER — TECHNETIUM TC 99M TETROFOSMIN IV KIT
30.0000 | PACK | Freq: Once | INTRAVENOUS | Status: AC | PRN
  Administered 2016-09-01: 30 via INTRAVENOUS

## 2016-09-01 MED ORDER — AMINOPHYLLINE 25 MG/ML IV SOLN
INTRAVENOUS | Status: AC
  Filled 2016-09-01: qty 10

## 2016-09-01 MED ORDER — REGADENOSON 0.4 MG/5ML IV SOLN
0.4000 mg | Freq: Once | INTRAVENOUS | Status: AC
  Administered 2016-09-01: 0.4 mg via INTRAVENOUS
  Filled 2016-09-01: qty 5

## 2016-09-01 MED ORDER — TECHNETIUM TC 99M TETROFOSMIN IV KIT
10.0000 | PACK | Freq: Once | INTRAVENOUS | Status: AC | PRN
  Administered 2016-09-01: 10 via INTRAVENOUS

## 2016-09-01 MED ORDER — BENZONATATE 100 MG PO CAPS
100.0000 mg | ORAL_CAPSULE | Freq: Three times a day (TID) | ORAL | Status: DC | PRN
  Administered 2016-09-01: 100 mg via ORAL
  Filled 2016-09-01: qty 1

## 2016-09-01 MED ORDER — MOMETASONE FURO-FORMOTEROL FUM 200-5 MCG/ACT IN AERO
2.0000 | INHALATION_SPRAY | Freq: Two times a day (BID) | RESPIRATORY_TRACT | Status: DC
  Administered 2016-09-01 – 2016-09-06 (×9): 2 via RESPIRATORY_TRACT
  Filled 2016-09-01 (×2): qty 8.8

## 2016-09-01 NOTE — H&P (Signed)
History & Physical    Patient ID: Lisa [name] MRN: [id number], DOB/AGE: Sep 23, 1945   Admit date: 08/31/2016   Primary Physician: Ignatius Specking, MD Primary Cardiologist: new - Dr. Royann Shivers  Patient Profile    Lisa [name] is  71 yo female with a PMH significant for COPD, current tobacco use, and GERD. She presented to [facility] with recurrent chest pain. She was transferred here for further evaluation.   Past Medical History    No past medical history on file.  Past Surgical History:  Procedure Laterality Date  . ABDOMINAL HYSTERECTOMY       Allergies  Allergies  Allergen Reactions  . Penicillins Anaphylaxis  . Sulfa Antibiotics Other (See Comments)    Hands and feet blistered    History of Present Illness    Lisa [name] states she had recurrent chest pain yesterday after eating a fatty meal. The pain was located substernally, in her central chest, and radiated to her back. The chest pain lasted about 30 min and was rated as a 10/10. She denies nausea/vomiting, dizziness, and feelings of syncope. She was originally scheduled for a stress test next week, but reported to the ED for recurrence of her chest pain. Upon arrival, troponins were cycled and she was started on heparin drip. She is currently chest-pain free.  She has a history of smoking and a family history of early-onset heart disease (father and brother died in 66s).  Home Medications    Prior to Admission medications   Medication Sig Start Date End Date Taking? Authorizing Provider  ADVAIR DISKUS 250-50 MCG/DOSE AEPB Inhale 1 puff into the lungs at bedtime as needed (for shortness of breath).  08/11/16  Yes [provider]  Chlorpheniramine Maleate (ALLERGY PO) Take 1 tablet by mouth daily. Equate brand   Yes [provider]  Cholecalciferol (VITAMIN D3) 2000 units TABS Take 2,000 Units by mouth daily.   Yes [provider]  esomeprazole (NEXIUM) 40 MG capsule Take 40 mg  by mouth daily. 08/17/16  Yes [provider]  Glucosamine-Chondroitin (MOVE FREE PO) Take 1 tablet by mouth daily.   Yes [provider]  Omega-3 Fatty Acids (FISH OIL OMEGA-3 PO) Take 1 capsule by mouth daily.   Yes [provider]  Probiotic Product (PROBIOTIC PO) Take 1 capsule by mouth daily.   Yes [provider]  VENTOLIN HFA 108 (90 Base) MCG/ACT inhaler Inhale 2 puffs into the lungs every 6 (six) hours as needed. 08/17/16  Yes [provider]  vitamin B-12 (CYANOCOBALAMIN) 500 MCG tablet Take 500 mcg by mouth daily.   Yes [provider]  hydrOXYzine (ATARAX/VISTARIL) 25 MG tablet Take 1 tablet (25 mg total) by mouth every 6 (six) hours as needed for itching. Patient not taking: Reported on 09/01/2016 11/27/15   Gilda Crease, MD    Family History     Family History  Problem Relation Age of Onset  . Asthma Mother   . Heart attack Father   . Cancer Other     Social History    Social History   Social History  . Marital status: Divorced    Spouse name: N/A  . Number of children: N/A  . Years of education: N/A   Occupational History  . Not on file.   Social History Main Topics  . Smoking status: Current Every Day Smoker    Packs/day: 0.50    Types: Cigarettes  . Smokeless tobacco: Never Used  . Alcohol use  Yes     Comment: occas  . Drug use: No  . Sexual activity: Not on file   Other Topics Concern  . Not on file   Social History Narrative  . No narrative on file     Review of Systems    General:  No chills, fever, night sweats or weight changes.  Cardiovascular:  No chest pain, dyspnea on exertion, edema, orthopnea, palpitations, paroxysmal nocturnal dyspnea. Dermatological: No rash, lesions/masses Respiratory: No cough, dyspnea Urologic: No hematuria, dysuria Abdominal:   No nausea, vomiting, diarrhea, bright red blood per rectum, melena, or hematemesis Neurologic:  No visual changes, wkns,  changes in mental status. All other systems reviewed and are otherwise negative except as noted above.  Physical Exam    Blood pressure 137/63, pulse (!) 57, temperature 98.2 F (36.8 C), temperature source Oral, resp. rate 18, height 5\' 7"  (1.702 m), weight 175 lb 9.5 oz (79.6 kg), SpO2 91 %.  General: Pleasant, NAD Psych: Normal affect. Neuro: Alert and oriented X 3. Moves all extremities spontaneously. HEENT: Normal  Neck: Supple without bruits or JVD. Lungs:  Resp regular and unlabored, CTA. Heart: RRR no s3, s4, or murmurs. Abdomen: Soft, non-tender, non-distended, BS + x 4.  Extremities: No clubbing, cyanosis, no edema. DP/PT/Radials 1+ and equal bilaterally.  Labs    Troponin (Point of Care Test) No results for input(s): TROPIPOC in the last 72 hours.  Recent Labs  09/01/16 0024 09/01/16 0536  TROPONINI 0.05* 0.07*   Lab Results  Component Value Date   WBC 8.2 09/01/2016   HGB 14.6 09/01/2016   HCT 45.9 09/01/2016   MCV 97.0 09/01/2016   PLT 184 09/01/2016    Recent Labs Lab 09/01/16 0536  NA 142  K 3.8  CL 108  CO2 26  BUN 15  CREATININE 0.82  CALCIUM 8.5*  PROT 6.2*  BILITOT 0.8  ALKPHOS 74  ALT 25  AST 23  GLUCOSE 97   Lab Results  Component Value Date   CHOL 148 09/01/2016   HDL 41 09/01/2016   LDLCALC 89 09/01/2016   TRIG 88 09/01/2016   No results found for: [facility] [employer]   Radiology Studies    No results found.  ECG & Cardiac Imaging    EKG 08/31/16:   Echocardiogram pending  Assessment & Plan    1. Unstable angina - troponin 0.05 -> 0.07 - EKG without clear signs of ischemia - her chest pain has typical and atypical features. Will proceed with the original plan and perform a lexiscan myoview to evaluate possible ischemia. Please keep pt NPO. Heparin drip running.   2. Tobacco use - will discuss tobacco cessation   3. COPD and chronic cough - does not appear to be in an exacerbation   4. GERD - protonix   Signed, Marcelino Dusterngela  Nicole Duke, PA-C 09/01/2016, 10:05 AM   I have seen and examined the patient along with Marcelino DusterAngela Nicole Duke, PA-C.  I have reviewed the chart, notes and new data.  I agree with PA's note.  Key new complaints: symptoms are compatible with angina, but were not associated with exertion, but rather occurred postprandially. Key examination changes: normal CV exam Key new findings / data: normal ECG (after resolution of symptoms) and borderline abnormal troponin.  PLAN: Lexiscan Myoview today. If abnormal cardiac cath tomorrow, if normal, evaluate for GI cause of symptoms.  Thurmon FairMihai Jeran Hiltz, MD, [facility] [phone] 09/01/2016, 11:12 AM

## 2016-09-01 NOTE — Progress Notes (Signed)
ANTICOAGULATION CONSULT NOTE - Initial Consult  Pharmacy Consult for heparin Indication: USAP  Allergies  Allergen Reactions  . Penicillins Anaphylaxis  . Sulfa Antibiotics Other (See Comments)    Hands and feet blistered    Patient Measurements: Height: 5\' 7"  (170.2 cm) Weight: 175 lb 9.6 oz (79.7 kg) IBW/kg (Calculated) : 61.6 Heparin Dosing Weight: 75kg  Vital Signs: Temp: 98.2 F (36.8 C) (07/04 2328) Temp Source: Oral (07/04 2328) BP: 138/66 (07/04 2328) Pulse Rate: 57 (07/04 2328)    Assessment: 71yo female tx'd from OSH for stress test, to begin heparin; not started on anticoag at OSH.  Goal of Therapy:  Heparin level 0.3-0.7 units/ml Monitor platelets by anticoagulation protocol: Yes   Plan:  Will give heparin 4000 units IV bolus x1 followed by gtt at 1000 units/hr and monitor heparin levels and CBC.  Vernard GamblesVeronda Yariah Selvey, PharmD, BCPS  09/01/2016,12:04 AM

## 2016-09-01 NOTE — Progress Notes (Signed)
Pt is scheduled for left heart catheterization tomorrow, 09/02/16, at 1630 with Dr. Eldridge DaceVaranasi. NPO at MN, no liquid breakfast - patient may go early (per cath lab).    Roe Rutherfordngela Nicole Martyn Timme, PA-C 09/01/2016, 4:51 PM [phone] [facility]

## 2016-09-01 NOTE — Progress Notes (Signed)
Patient presented for Lexiscan. Tolerated procedure well. Pending final stress imaging result.  

## 2016-09-01 NOTE — Progress Notes (Signed)
ANTICOAGULATION CONSULT NOTE  Pharmacy Consult for heparin Indication: USAP  Allergies  Allergen Reactions  . Penicillins Anaphylaxis  . Sulfa Antibiotics Other (See Comments)    Hands and feet blistered    Patient Measurements: Height: 5\' 7"  (170.2 cm) Weight: 175 lb 9.5 oz (79.6 kg) IBW/kg (Calculated) : 61.6 Heparin Dosing Weight: 75kg  Vital Signs: Temp: 98.2 F (36.8 C) (07/05 0453) Temp Source: Oral (07/05 0453) BP: 159/87 (07/05 1252) Pulse Rate: 57 (07/05 0453)    Assessment: 71yo female tx'd from OSH s/p stress test on heparin. Heparin level is at goal and test results pending  Goal of Therapy:  Heparin level 0.3-0.7 units/ml Monitor platelets by anticoagulation protocol: Yes   Plan:  -No heparin changes needed -Daily heparin level and CBC  Harland GermanAndrew Phillis Thackeray, Pharm D 09/01/2016 3:50 PM

## 2016-09-02 ENCOUNTER — Encounter (HOSPITAL_COMMUNITY)
Admission: AD | Disposition: A | Discharge status: Home / Self Care | Payer: Self-pay | Source: Other Acute Inpatient Hospital | Attending: Internal Medicine

## 2016-09-02 ENCOUNTER — Inpatient Hospital Stay (HOSPITAL_COMMUNITY): Payer: MEDICARE

## 2016-09-02 DIAGNOSIS — I25110 Atherosclerotic heart disease of native coronary artery with unstable angina pectoris: Secondary | ICD-10-CM

## 2016-09-02 DIAGNOSIS — I340 Nonrheumatic mitral (valve) insufficiency: Secondary | ICD-10-CM

## 2016-09-02 HISTORY — PX: LEFT HEART CATH AND CORONARY ANGIOGRAPHY: CATH118249

## 2016-09-02 LAB — CBC
HCT: 44.9 % (ref 36.0–46.0)
Hemoglobin: 14.4 g/dL (ref 12.0–15.0)
MCH: 31.1 pg (ref 26.0–34.0)
MCHC: 32.1 g/dL (ref 30.0–36.0)
MCV: 97 fL (ref 78.0–100.0)
PLATELETS: 185 10*3/uL (ref 150–400)
RBC: 4.63 MIL/uL (ref 3.87–5.11)
RDW: 14 % (ref 11.5–15.5)
WBC: 8.2 10*3/uL (ref 4.0–10.5)

## 2016-09-02 LAB — ECHOCARDIOGRAM COMPLETE
Height: 67 in
WEIGHTICAEL: 2809.54 [oz_av]

## 2016-09-02 LAB — HEMOGLOBIN A1C
Hgb A1c MFr Bld: 5.9 % — ABNORMAL HIGH (ref 4.8–5.6)
MEAN PLASMA GLUCOSE: 123 mg/dL

## 2016-09-02 LAB — HEPARIN LEVEL (UNFRACTIONATED): Heparin Unfractionated: 0.33 IU/mL (ref 0.30–0.70)

## 2016-09-02 SURGERY — LEFT HEART CATH AND CORONARY ANGIOGRAPHY
Anesthesia: LOCAL

## 2016-09-02 MED ORDER — MIDAZOLAM HCL 2 MG/2ML IJ SOLN
INTRAMUSCULAR | Status: AC
  Filled 2016-09-02: qty 2

## 2016-09-02 MED ORDER — DIPHENHYDRAMINE HCL 25 MG PO CAPS
25.0000 mg | ORAL_CAPSULE | Freq: Once | ORAL | Status: AC
  Administered 2016-09-02: 25 mg via ORAL
  Filled 2016-09-02: qty 1

## 2016-09-02 MED ORDER — HEPARIN (PORCINE) IN NACL 2-0.9 UNIT/ML-% IJ SOLN
INTRAMUSCULAR | Status: AC
  Filled 2016-09-02: qty 1000

## 2016-09-02 MED ORDER — ASPIRIN 81 MG PO CHEW
81.0000 mg | CHEWABLE_TABLET | Freq: Every day | ORAL | Status: DC
  Administered 2016-09-03: 81 mg via ORAL
  Filled 2016-09-02: qty 1

## 2016-09-02 MED ORDER — LIDOCAINE HCL (PF) 1 % IJ SOLN
INTRAMUSCULAR | Status: DC | PRN
  Administered 2016-09-02: 2 mL

## 2016-09-02 MED ORDER — NITROGLYCERIN 1 MG/10 ML FOR IR/CATH LAB
INTRA_ARTERIAL | Status: DC | PRN
  Administered 2016-09-02: 200 ug via INTRA_ARTERIAL
  Administered 2016-09-02: 400 ug via INTRA_ARTERIAL

## 2016-09-02 MED ORDER — HEPARIN SODIUM (PORCINE) 1000 UNIT/ML IJ SOLN
INTRAMUSCULAR | Status: DC | PRN
  Administered 2016-09-02: 5000 [IU] via INTRAVENOUS
  Administered 2016-09-02: 4000 [IU] via INTRAVENOUS

## 2016-09-02 MED ORDER — VERAPAMIL HCL 2.5 MG/ML IV SOLN
INTRAVENOUS | Status: DC | PRN
  Administered 2016-09-02: 10 mL via INTRA_ARTERIAL

## 2016-09-02 MED ORDER — HEPARIN (PORCINE) IN NACL 2-0.9 UNIT/ML-% IJ SOLN
INTRAMUSCULAR | Status: AC | PRN
  Administered 2016-09-02: 1000 mL

## 2016-09-02 MED ORDER — TICAGRELOR 90 MG PO TABS
90.0000 mg | ORAL_TABLET | Freq: Two times a day (BID) | ORAL | Status: DC
  Administered 2016-09-03 – 2016-09-06 (×7): 90 mg via ORAL
  Filled 2016-09-02 (×7): qty 1

## 2016-09-02 MED ORDER — FENTANYL CITRATE (PF) 100 MCG/2ML IJ SOLN
INTRAMUSCULAR | Status: DC | PRN
  Administered 2016-09-02: 25 ug via INTRAVENOUS

## 2016-09-02 MED ORDER — ALBUTEROL SULFATE HFA 108 (90 BASE) MCG/ACT IN AERS: 2.0000 | INHALATION_SPRAY | Freq: Four times a day (QID) | RESPIRATORY_TRACT | Status: DC | PRN

## 2016-09-02 MED ORDER — TIROFIBAN (AGGRASTAT) BOLUS VIA INFUSION: INTRAVENOUS | Status: DC | PRN

## 2016-09-02 MED ORDER — TIROFIBAN HCL IN NACL 5-0.9 MG/100ML-% IV SOLN: INTRAVENOUS | Status: DC | PRN

## 2016-09-02 MED ORDER — SODIUM CHLORIDE 0.9% FLUSH: 3.0000 mL | INTRAVENOUS | Status: DC | PRN

## 2016-09-02 MED ORDER — LABETALOL HCL 5 MG/ML IV SOLN: 10.0000 mg | INTRAVENOUS | Status: AC | PRN

## 2016-09-02 MED ORDER — IOPAMIDOL (ISOVUE-370) INJECTION 76%
INTRAVENOUS | Status: DC | PRN
  Administered 2016-09-02: 60 mL via INTRA_ARTERIAL

## 2016-09-02 MED ORDER — HEPARIN SODIUM (PORCINE) 1000 UNIT/ML IJ SOLN
INTRAMUSCULAR | Status: AC
  Filled 2016-09-02: qty 1

## 2016-09-02 MED ORDER — LIDOCAINE HCL 1 % IJ SOLN
INTRAMUSCULAR | Status: AC
  Filled 2016-09-02: qty 20

## 2016-09-02 MED ORDER — VERAPAMIL HCL 2.5 MG/ML IV SOLN
INTRAVENOUS | Status: AC
  Filled 2016-09-02: qty 2

## 2016-09-02 MED ORDER — SODIUM CHLORIDE 0.9 % IV BOLUS (SEPSIS)
500.0000 mL | Freq: Once | INTRAVENOUS | Status: AC
Start: 2016-09-02 — End: 2016-09-02
  Administered 2016-09-02: 500 mL via INTRAVENOUS

## 2016-09-02 MED ORDER — HYDRALAZINE HCL 20 MG/ML IJ SOLN: 5.0000 mg | INTRAMUSCULAR | Status: AC | PRN

## 2016-09-02 MED ORDER — ONDANSETRON HCL 4 MG/2ML IJ SOLN: 4.0000 mg | Freq: Four times a day (QID) | INTRAMUSCULAR | Status: DC | PRN

## 2016-09-02 MED ORDER — HEPARIN (PORCINE) IN NACL 100-0.45 UNIT/ML-% IJ SOLN
1200.0000 [IU]/h | INTRAMUSCULAR | Status: DC
  Administered 2016-09-02: 1100 [IU]/h via INTRAVENOUS
  Administered 2016-09-03 – 2016-09-04 (×2): 1200 [IU]/h via INTRAVENOUS
  Filled 2016-09-02 (×3): qty 250

## 2016-09-02 MED ORDER — IOPAMIDOL (ISOVUE-370) INJECTION 76%
INTRAVENOUS | Status: AC
  Filled 2016-09-02: qty 100

## 2016-09-02 MED ORDER — HYDROXYZINE HCL 25 MG PO TABS
25.0000 mg | ORAL_TABLET | Freq: Four times a day (QID) | ORAL | Status: DC | PRN
  Filled 2016-09-02 (×2): qty 1

## 2016-09-02 MED ORDER — TIROFIBAN HCL IN NACL 5-0.9 MG/100ML-% IV SOLN
INTRAVENOUS | Status: AC
  Filled 2016-09-02: qty 100

## 2016-09-02 MED ORDER — SODIUM CHLORIDE 0.9 % IV SOLN: INTRAVENOUS | Status: AC

## 2016-09-02 MED ORDER — MIDAZOLAM HCL 2 MG/2ML IJ SOLN
INTRAMUSCULAR | Status: DC | PRN
  Administered 2016-09-02: 2 mg via INTRAVENOUS

## 2016-09-02 MED ORDER — MOMETASONE FURO-FORMOTEROL FUM 200-5 MCG/ACT IN AERO: 2.0000 | INHALATION_SPRAY | Freq: Two times a day (BID) | RESPIRATORY_TRACT | Status: DC

## 2016-09-02 MED ORDER — PANTOPRAZOLE SODIUM 40 MG PO TBEC: 40.0000 mg | DELAYED_RELEASE_TABLET | Freq: Every day | ORAL | Status: DC

## 2016-09-02 MED ORDER — TICAGRELOR 90 MG PO TABS
180.0000 mg | ORAL_TABLET | Freq: Once | ORAL | Status: AC
  Administered 2016-09-02: 180 mg via ORAL
  Filled 2016-09-02: qty 2

## 2016-09-02 MED ORDER — ACETAMINOPHEN 325 MG PO TABS
650.0000 mg | ORAL_TABLET | ORAL | Status: DC | PRN
  Administered 2016-09-02: 650 mg via ORAL
  Filled 2016-09-02: qty 2

## 2016-09-02 MED ORDER — SODIUM CHLORIDE 0.9% FLUSH
3.0000 mL | Freq: Two times a day (BID) | INTRAVENOUS | Status: DC
  Administered 2016-09-03 – 2016-09-05 (×3): 3 mL via INTRAVENOUS

## 2016-09-02 MED ORDER — FENTANYL CITRATE (PF) 100 MCG/2ML IJ SOLN
INTRAMUSCULAR | Status: AC
  Filled 2016-09-02: qty 2

## 2016-09-02 MED ORDER — NITROGLYCERIN 1 MG/10 ML FOR IR/CATH LAB
INTRA_ARTERIAL | Status: AC
  Filled 2016-09-02: qty 10

## 2016-09-02 MED ORDER — SODIUM CHLORIDE 0.9 % IV SOLN: 250.0000 mL | INTRAVENOUS | Status: DC | PRN

## 2016-09-02 SURGICAL SUPPLY — 16 items
CATH INFINITI 5 FR JL3.5 (CATHETERS) ×2 IMPLANT
CATH INFINITI JR4 5F (CATHETERS) ×2 IMPLANT
CATH LAUNCHER 6FR EBU 3 (CATHETERS) ×2 IMPLANT
DEVICE RAD COMP TR BAND LRG (VASCULAR PRODUCTS) ×2 IMPLANT
GLIDESHEATH SLEND SS 6F .021 (SHEATH) ×2 IMPLANT
GUIDEWIRE INQWIRE 1.5J.035X260 (WIRE) ×1 IMPLANT
INQWIRE 1.5J .035X260CM (WIRE) ×2
KIT ENCORE 26 ADVANTAGE (KITS) ×2 IMPLANT
KIT HEART LEFT (KITS) ×2 IMPLANT
PACK CARDIAC CATHETERIZATION (CUSTOM PROCEDURE TRAY) ×2 IMPLANT
TRANSDUCER W/STOPCOCK (MISCELLANEOUS) ×2 IMPLANT
TUBING CIL FLEX 10 FLL-RA (TUBING) ×2 IMPLANT
VALVE GUARDIAN II ~~LOC~~ HEMO (MISCELLANEOUS) ×2 IMPLANT
WIRE ASAHI PROWATER 180CM (WIRE) ×2 IMPLANT
WIRE HI TORQ BMW 190CM (WIRE) ×2 IMPLANT
WIRE HI TORQ VERSACORE-J 145CM (WIRE) ×2 IMPLANT

## 2016-09-02 NOTE — Progress Notes (Signed)
ANTICOAGULATION CONSULT NOTE  Pharmacy Consult for heparin Indication: USAP  Allergies  Allergen Reactions  . Penicillins Anaphylaxis  . Sulfa Antibiotics Other (See Comments)    Hands and feet blistered    Patient Measurements: Height: 5\' 7"  (170.2 cm) Weight: 175 lb 9.5 oz (79.6 kg) IBW/kg (Calculated) : 61.6 Heparin Dosing Weight: 75kg  Vital Signs: Temp: 98.3 F (36.8 C) (07/06 0428) Temp Source: Oral (07/06 0428) BP: 138/77 (07/06 1459) Pulse Rate: 56 (07/06 1504)    Assessment: 71yo female tx'd from OSH s/p stress test on heparin. She is now s/p cath with L circ disease and for PCI on Monday. Heparin to resart 6 hours post sheath (removed at ~ 3pm) -last heparin level was 0.33 on heparin 1000 units/hr  Goal of Therapy:  Heparin level 0.3-0.7 units/ml Monitor platelets by anticoagulation protocol: Yes   Plan:  -Restart heparin at 1100 units/hr at 9pm -heparin level and CBC in am  Harland GermanAndrew Catalaya Garr, Pharm D 09/02/2016 3:27 PM

## 2016-09-02 NOTE — Progress Notes (Signed)
Progress Note  Patient Name: Lisa SimsCarolyn [name] Date of Encounter: 09/02/2016  Primary Cardiologist: Cr. Shelitha Magley  Subjective   Patient is feeling well; denies chest pain, SOB, and palpitations. Awaiting heart cath.  Inpatient Medications    Scheduled Meds: . [START ON 09/03/2016] aspirin EC  81 mg Oral Daily  . atorvastatin  80 mg Oral q1800  . metoprolol tartrate  12.5 mg Oral BID  . mometasone-formoterol  2 puff Inhalation BID  . pantoprazole  40 mg Oral Daily  . sodium chloride flush  3 mL Intravenous Q12H  . sodium chloride flush  3 mL Intravenous Q12H   Continuous Infusions: . sodium chloride    . sodium chloride    . heparin 1,000 Units/hr (09/01/16 1717)   PRN Meds: sodium chloride, albuterol, benzonatate, sodium chloride flush   Vital Signs    Vitals:   09/01/16 1252 09/01/16 2227 09/02/16 0428 09/02/16 0910  BP: (!) 159/87 134/72 (!) 123/57   Pulse:  61 (!) 59   Resp:  18 19   Temp:  98.2 F (36.8 C) 98.3 F (36.8 C)   TempSrc:  Oral Oral   SpO2:  92% 92% 92%  Weight:      Height:        Intake/Output Summary (Last 24 hours) at 09/02/16 0949 Last data filed at 09/02/16 0730  Gross per 24 hour  Intake             1262 ml  Output                0 ml  Net             1262 ml   Filed Weights   08/31/16 2328 09/01/16 0453  Weight: 175 lb 9.6 oz (79.7 kg) 175 lb 9.5 oz (79.6 kg)     Physical Exam   General: Well developed, well nourished, female appearing in no acute distress. Head: Normocephalic, atraumatic.  Neck: Supple without bruits, no JVD Lungs:  Resp regular and unlabored, CTA. Heart: RRR, S1, S2, no S3, S4, or murmur; no rub. Abdomen: Soft, non-tender, non-distended with normoactive bowel sounds. No hepatomegaly. No rebound/guarding. No obvious abdominal masses. Extremities: No clubbing, cyanosis, no edema. Distal pedal pulses are 2+ bilaterally. Neuro: Alert and oriented X 3. Moves all extremities spontaneously. Psych: Normal  affect.  Labs    Chemistry Recent Labs Lab 09/01/16 0536  NA 142  K 3.8  CL 108  CO2 26  GLUCOSE 97  BUN 15  CREATININE 0.82  CALCIUM 8.5*  PROT 6.2*  ALBUMIN 3.4*  AST 23  ALT 25  ALKPHOS 74  BILITOT 0.8  GFRNONAA >60  GFRAA >60  ANIONGAP 8     Hematology Recent Labs Lab 09/01/16 0536 09/02/16 0214  WBC 8.2 8.2  RBC 4.73 4.63  HGB 14.6 14.4  HCT 45.9 44.9  MCV 97.0 97.0  MCH 30.9 31.1  MCHC 31.8 32.1  RDW 14.0 14.0  PLT 184 185    Cardiac Enzymes Recent Labs Lab 09/01/16 0024 09/01/16 0536 09/01/16 1428  TROPONINI 0.05* 0.07* 0.06*   No results for input(s): TROPIPOC in the last 168 hours.   BNP Recent Labs Lab 09/01/16 0024  BNP 86.2     DDimer No results for input(s): DDIMER in the last 168 hours.   Radiology    Nm Myocar Multi W/spect W/wall Motion / Ef  Result Date: 09/01/2016  There was no ST segment deviation noted during stress.  Findings consistent with ischemia.  This is an intermediate risk study.  The left ventricular ejection fraction is normal (55-65%).  1. EF 65%, normal wall motion. 2. There was a primarily reversible, medium-sized, mild basal to mid inferolateral perfusion defect.  This is concerning for ischemia. Intermediate risk study.      Telemetry    Sinus rhythm with PVCs - Personally Reviewed  ECG    No new tracings - Personally Reviewed   Cardiac Studies   Left heart catheterization 09/02/16: pending  Myoview 09/01/16:  There was no ST segment deviation noted during stress.  Findings consistent with ischemia.  This is an intermediate risk study.  The left ventricular ejection fraction is normal (55-65%).   1. EF 65%, normal wall motion.  2. There was a primarily reversible, medium-sized, mild basal to mid inferolateral perfusion defect.  This is concerning for ischemia.   Intermediate risk study.    Patient Profile     71 y.o. female with a PMH significant for COPD, current tobacco use, and  GERD. She presented to [facility] with recurrent chest pain. She was transferred here for further evaluation.   Assessment & Plan    1. Chest pain - myoview yesterday with evidence of reversible ischemia - scheduled for left heart cath today - heparin drip running - she denies further chest pain   2. COPD, tobacco use - not in an exacerbation - discussed smoking cessation   Signed, Marcelino Duster , PA-C 9:49 AM 09/02/2016 Pager: [phone]  I have seen and examined the patient along with Marcelino Duster , PA-C.  I have reviewed the chart, notes and new data.  I agree with PA's note.  Key new complaints: no chest pain overnight Key examination changes: no arrhythmia or signs of CHF Key new findings / data: nuclear study suggests single vessel, probably LCX disease.  PLAN: Cardiac cath and possible PCI stent today. This procedure has been fully reviewed with the patient and written informed consent has been obtained.   Thurmon Fair, MD, [facility] [phone] 09/02/2016, 10:21 AM

## 2016-09-02 NOTE — Progress Notes (Signed)
  Echocardiogram 2D Echocardiogram has been performed.  Lisa [name] 09/02/2016, 12:34 PM

## 2016-09-02 NOTE — Interval H&P Note (Signed)
Cath Lab Visit (complete for each Cath Lab visit)  Clinical Evaluation Leading to the Procedure:    ACS: Yes.    Non-ACS:    Anginal Classification: CCS IV  Anti-ischemic medical therapy: Minimal Therapy (1 class of medications)  Non-Invasive Test Results: Intermediate-risk stress test findings: cardiac mortality 1-3%/year  Prior CABG: No previous CABG      History and Physical Interval Note:  09/02/2016 2:13 PM  Lisa [name]  has presented today for surgery, with the diagnosis of cp  The various methods of treatment have been discussed with the patient and family. After consideration of risks, benefits and other options for treatment, the patient has consented to  Procedure(s): Left Heart Cath and Coronary Angiography (N/A) as a surgical intervention .  The patient's history has been reviewed, patient examined, no change in status, stable for surgery.  I have reviewed the patient's chart and labs.  Questions were answered to the patient's satisfaction.     Lance MussJayadeep James Senn

## 2016-09-02 NOTE — Progress Notes (Signed)
Patient left unit to cath lab; left unit via bed, accompanied by two daughters and cath lab transportation technician. Consents signed and in chart. No signs of distress

## 2016-09-02 NOTE — Progress Notes (Signed)
Watched PCI video as ordered. No questions at this time.On call cardiologist fellow aware of need for cough med and inhaler. New orders obtained. Pt inhaler in closet instructed to send home. Groins clipped as ordered also by NA Cordelia.

## 2016-09-02 NOTE — Progress Notes (Signed)
ANTICOAGULATION CONSULT NOTE  Pharmacy Consult for heparin Indication: USAP  Allergies  Allergen Reactions  . Penicillins Anaphylaxis  . Sulfa Antibiotics Other (See Comments)    Hands and feet blistered    Patient Measurements: Height: 5\' 7"  (170.2 cm) Weight: 175 lb 9.5 oz (79.6 kg) IBW/kg (Calculated) : 61.6 Heparin Dosing Weight: 75kg  Vital Signs: Temp: 98.3 F (36.8 C) (07/06 0428) Temp Source: Oral (07/06 0428) BP: 123/57 (07/06 0428) Pulse Rate: 59 (07/06 0428)    Assessment: 71yo female tx'd from OSH s/p stress test on heparin. Stress test showed ischemia and plans for cath today -Heparin level is at gol  Goal of Therapy:  Heparin level 0.3-0.7 units/ml Monitor platelets by anticoagulation protocol: Yes   Plan:  -No heparin changes needed -Will follow plans post cath  Harland GermanAndrew Kammi Hechler, Pharm D 09/02/2016 11:13 AM

## 2016-09-02 NOTE — Progress Notes (Signed)
Patient did not come back to unit, belongings in room bagged and  Left at nurses station. Will send to which ever room patient will be going to, waiting on call.

## 2016-09-02 NOTE — H&P (View-Only) (Signed)
Progress Note  Patient Name: Lisa SimsCarolyn Kirschenbaum Date of Encounter: 09/02/2016  Primary Cardiologist: Cr. Khaliah Barnick  Subjective   Patient is feeling well; denies chest pain, SOB, and palpitations. Awaiting heart cath.  Inpatient Medications    Scheduled Meds: . [START ON 09/03/2016] aspirin EC  81 mg Oral Daily  . atorvastatin  80 mg Oral q1800  . metoprolol tartrate  12.5 mg Oral BID  . mometasone-formoterol  2 puff Inhalation BID  . pantoprazole  40 mg Oral Daily  . sodium chloride flush  3 mL Intravenous Q12H  . sodium chloride flush  3 mL Intravenous Q12H   Continuous Infusions: . sodium chloride    . sodium chloride    . heparin 1,000 Units/hr (09/01/16 1717)   PRN Meds: sodium chloride, albuterol, benzonatate, sodium chloride flush   Vital Signs    Vitals:   09/01/16 1252 09/01/16 2227 09/02/16 0428 09/02/16 0910  BP: (!) 159/87 134/72 (!) 123/57   Pulse:  61 (!) 59   Resp:  18 19   Temp:  98.2 F (36.8 C) 98.3 F (36.8 C)   TempSrc:  Oral Oral   SpO2:  92% 92% 92%  Weight:      Height:        Intake/Output Summary (Last 24 hours) at 09/02/16 0949 Last data filed at 09/02/16 0730  Gross per 24 hour  Intake             1262 ml  Output                0 ml  Net             1262 ml   Filed Weights   08/31/16 2328 09/01/16 0453  Weight: 175 lb 9.6 oz (79.7 kg) 175 lb 9.5 oz (79.6 kg)     Physical Exam   General: Well developed, well nourished, female appearing in no acute distress. Head: Normocephalic, atraumatic.  Neck: Supple without bruits, no JVD Lungs:  Resp regular and unlabored, CTA. Heart: RRR, S1, S2, no S3, S4, or murmur; no rub. Abdomen: Soft, non-tender, non-distended with normoactive bowel sounds. No hepatomegaly. No rebound/guarding. No obvious abdominal masses. Extremities: No clubbing, cyanosis, no edema. Distal pedal pulses are 2+ bilaterally. Neuro: Alert and oriented X 3. Moves all extremities spontaneously. Psych: Normal  affect.  Labs    Chemistry Recent Labs Lab 09/01/16 0536  NA 142  K 3.8  CL 108  CO2 26  GLUCOSE 97  BUN 15  CREATININE 0.82  CALCIUM 8.5*  PROT 6.2*  ALBUMIN 3.4*  AST 23  ALT 25  ALKPHOS 74  BILITOT 0.8  GFRNONAA >60  GFRAA >60  ANIONGAP 8     Hematology Recent Labs Lab 09/01/16 0536 09/02/16 0214  WBC 8.2 8.2  RBC 4.73 4.63  HGB 14.6 14.4  HCT 45.9 44.9  MCV 97.0 97.0  MCH 30.9 31.1  MCHC 31.8 32.1  RDW 14.0 14.0  PLT 184 185    Cardiac Enzymes Recent Labs Lab 09/01/16 0024 09/01/16 0536 09/01/16 1428  TROPONINI 0.05* 0.07* 0.06*   No results for input(s): TROPIPOC in the last 168 hours.   BNP Recent Labs Lab 09/01/16 0024  BNP 86.2     DDimer No results for input(s): DDIMER in the last 168 hours.   Radiology    Nm Myocar Multi W/spect W/wall Motion / Ef  Result Date: 09/01/2016  There was no ST segment deviation noted during stress.  Findings consistent with ischemia.  This is an intermediate risk study.  The left ventricular ejection fraction is normal (55-65%).  1. EF 65%, normal wall motion. 2. There was a primarily reversible, medium-sized, mild basal to mid inferolateral perfusion defect.  This is concerning for ischemia. Intermediate risk study.      Telemetry    Sinus rhythm with PVCs - Personally Reviewed  ECG    No new tracings - Personally Reviewed   Cardiac Studies   Left heart catheterization 09/02/16: pending  Myoview 09/01/16:  There was no ST segment deviation noted during stress.  Findings consistent with ischemia.  This is an intermediate risk study.  The left ventricular ejection fraction is normal (55-65%).   1. EF 65%, normal wall motion.  2. There was a primarily reversible, medium-sized, mild basal to mid inferolateral perfusion defect.  This is concerning for ischemia.   Intermediate risk study.    Patient Profile     71 y.o. female with a PMH significant for COPD, current tobacco use, and  GERD. She presented to [facility] with recurrent chest pain. She was transferred here for further evaluation.   Assessment & Plan    1. Chest pain - myoview yesterday with evidence of reversible ischemia - scheduled for left heart cath today - heparin drip running - she denies further chest pain   2. COPD, tobacco use - not in an exacerbation - discussed smoking cessation   Signed, Marcelino Duster , PA-C 9:49 AM 09/02/2016 Pager: [phone]  I have seen and examined the patient along with Marcelino Duster , PA-C.  I have reviewed the chart, notes and new data.  I agree with PA's note.  Key new complaints: no chest pain overnight Key examination changes: no arrhythmia or signs of CHF Key new findings / data: nuclear study suggests single vessel, probably LCX disease.  PLAN: Cardiac cath and possible PCI stent today. This procedure has been fully reviewed with the patient and written informed consent has been obtained.   Thurmon Fair, MD, [facility] [phone] 09/02/2016, 10:21 AM

## 2016-09-02 NOTE — Progress Notes (Signed)
Pt returned from cath lab. Pt belongings returned. Site stable, level 0, 12 cc of air in band. VSS. Pt assisted to restroom.   Leonidas Rombergaitlin S Bumbledare, RN

## 2016-09-03 LAB — CBC
HEMATOCRIT: 43.1 % (ref 36.0–46.0)
HEMOGLOBIN: 13.9 g/dL (ref 12.0–15.0)
MCH: 30.8 pg (ref 26.0–34.0)
MCHC: 32.3 g/dL (ref 30.0–36.0)
MCV: 95.6 fL (ref 78.0–100.0)
Platelets: 164 10*3/uL (ref 150–400)
RBC: 4.51 MIL/uL (ref 3.87–5.11)
RDW: 14 % (ref 11.5–15.5)
WBC: 6.9 10*3/uL (ref 4.0–10.5)

## 2016-09-03 LAB — HEPARIN LEVEL (UNFRACTIONATED)
HEPARIN UNFRACTIONATED: 0.5 [IU]/mL (ref 0.30–0.70)
Heparin Unfractionated: 0.26 IU/mL — ABNORMAL LOW (ref 0.30–0.70)

## 2016-09-03 LAB — BASIC METABOLIC PANEL
ANION GAP: 8 (ref 5–15)
BUN: 10 mg/dL (ref 6–20)
CO2: 21 mmol/L — AB (ref 22–32)
CREATININE: 0.93 mg/dL (ref 0.44–1.00)
Calcium: 8.6 mg/dL — ABNORMAL LOW (ref 8.9–10.3)
Chloride: 112 mmol/L — ABNORMAL HIGH (ref 101–111)
GFR calc non Af Amer: 60 mL/min — ABNORMAL LOW (ref >60)
Glucose, Bld: 109 mg/dL — ABNORMAL HIGH (ref 65–99)
Potassium: 3.8 mmol/L (ref 3.5–5.1)
SODIUM: 141 mmol/L (ref 135–145)

## 2016-09-03 LAB — GLUCOSE, CAPILLARY: GLUCOSE-CAPILLARY: 111 mg/dL — AB (ref 65–99)

## 2016-09-03 NOTE — Progress Notes (Signed)
Dr. Donnie Ahoilley on floor and made aware of patient heart rate dipping into 30s and currently in 50s on monitor. No new orders received will monitor patient. Fariha Goto, Randall AnKristin Jessup RN

## 2016-09-03 NOTE — Progress Notes (Signed)
500 cc bolus has been given. Pt states she feels a little less lightheaded. HR still going from the 40s to 60s. She wants to try a Benadryl to help get to sleep since she feel restless.  On call cardiologist was informed and ordered a one time benadryl 25 mg oral.  Med has been given.  Will continue to monitor pt.  Harriet Massonavidson, Joseline Mccampbell E, RN

## 2016-09-03 NOTE — Progress Notes (Signed)
Pt's heart rate dropped down to the 40s to 50s. Stated she started feeling lightheaded and "like she was about to faint. BP was normal at 147/80. An EKG was done and said she was in sinus bradycardia with atrial arrhythmia. On call cardiologist informed & came to check on pt. MD believes rhythm a vagal response.  Ordered a 500 cc bolus of saline IV once.  Will continue to monitor pt.  Harriet Massonavidson, Monnie Anspach E, RN

## 2016-09-03 NOTE — Progress Notes (Signed)
ANTICOAGULATION CONSULT NOTE - Follow Up Consult  Pharmacy Consult for Heparin Indication: CAD awaiting PCI  Allergies  Allergen Reactions  . Penicillins Anaphylaxis  . Sulfa Antibiotics Other (See Comments)    Hands and feet blistered    Patient Measurements: Height: 5\' 7"  (170.2 cm) Weight: 176 lb 9.6 oz (80.1 kg) IBW/kg (Calculated) : 61.6  Vital Signs: Temp: 97.9 F (36.6 C) (07/07 0439) Temp Source: Oral (07/07 0439) BP: 137/73 (07/07 0935) Pulse Rate: 58 (07/07 0935)  Labs:  Recent Labs  09/01/16 0024  09/01/16 0536  09/01/16 1428 09/02/16 0214 09/03/16 0146 09/03/16 1207  HGB  --   < > 14.6  --   --  14.4 13.9  --   HCT  --   --  45.9  --   --  44.9 43.1  --   PLT  --   --  184  --   --  185 164  --   LABPROT  --   --  13.9  --   --   --   --   --   INR  --   --  1.07  --   --   --   --   --   HEPARINUNFRC  --   --   --   < > 0.33 0.33 0.26* 0.50  CREATININE  --   --  0.82  --   --   --  0.93  --   TROPONINI 0.05*  --  0.07*  --  0.06*  --   --   --   < > = values in this interval not displayed.  Estimated Creatinine Clearance: 58.7 mL/min (by C-G formula based on SCr of 0.93 mg/dL).   Medications:  Heparin @ 1200 units/hr  Assessment: 71yof continues on heparin for CAD with complex circumflex disease awaiting PCI on Monday. Heparin level is therapeutic at 0.51. CBC stable. No bleeding.  Goal of Therapy:  Heparin level 0.3-0.7 units/ml Monitor platelets by anticoagulation protocol: Yes   Plan:  1) Continue heparin at 1200 units/hr 2) Daily heparin level and CBC  Fredrik RiggerMarkle, Vani Gunner Sue 09/03/2016,12:52 PM

## 2016-09-03 NOTE — Progress Notes (Signed)
CARDIAC REHAB PHASE I   PRE:  Rate/Rhythm: 54SB  BP:   Sitting: 104/70     SaO2: 96% RA  MODE:  Ambulation: 150 ft   POST:  Rate/Rhythm: 66 SR  BP:   Sitting: 114/64      SaO2: 97% RA [phone]   Pt ambulated 18150ft with one person assist and IV pole. Pt maintained a somewhat steady gait and took a 30 second standing rest break. Pt SpO2 remained over 90% with ambulation. Pt denies SOB, chest pain, dizziness with walking. Returned pt to beside with VSS. Discussed smoking cessation, HH diet and risk factors. Pt is plan for cath/PCI on Monday July 9th. Pt is adamant about not smoking.  Nera Haworth D Apostolos Blagg,MS,ACSM-RCEP 09/03/2016 10:30 AM

## 2016-09-03 NOTE — Progress Notes (Signed)
Subjective:  She feels fine this morning but stated that she had lightheadedness and dizziness and shortness of breath last night.  She was treated with a 500 cc saline bolus and feels better now.  Sinus bradycardia and her rate is 30s was noted and it was thought to be vasovagal.  No chest pain or shortness of breath.  Objective:  Vital Signs in the last 24 hours: BP 137/73   Pulse (!) 58   Temp 97.9 F (36.6 C) (Oral)   Resp 20   Ht 5\' 7"  (1.702 m)   Wt 80.1 kg (176 lb 9.6 oz)   LMP 09/01/2016   SpO2 96%   BMI 27.66 kg/m   Physical Exam: Pleasant female in no acute distress Lungs:  Clear Cardiac:  Regular rhythm, normal S1 and S2, no S3 Extremities:  Radial cath site clean and dry with very minimal ecchymoses.    Intake/Output from previous day: 07/06 0701 - 07/07 0700 In: 601.5 [I.V.:601.5] Out: -   Weight Filed Weights   08/31/16 2328 09/01/16 0453 09/03/16 0443  Weight: 79.7 kg (175 lb 9.6 oz) 79.6 kg (175 lb 9.5 oz) 80.1 kg (176 lb 9.6 oz)    Lab Results: Basic Metabolic Panel:  Recent Labs  16/11/9605/05/18 0536 09/03/16 0146  NA 142 141  K 3.8 3.8  CL 108 112*  CO2 26 21*  GLUCOSE 97 109*  BUN 15 10  CREATININE 0.82 0.93   CBC:  Recent Labs  09/02/16 0214 09/03/16 0146  WBC 8.2 6.9  HGB 14.4 13.9  HCT 44.9 43.1  MCV 97.0 95.6  PLT 185 164   Cardiac Panel (last 3 results)  Recent Labs  09/01/16 0024 09/01/16 0536 09/01/16 1428  TROPONINI 0.05* 0.07* 0.06*    Telemetry: Personally reviewed.  Sinus rhythm with some sinus bradycardia noted last night.  Assessment/Plan:  1.  CAD with complex bifurcation disease of the circumflex awaiting stenting from the femoral approach on Monday 2.  COPD 3.  Probable vagal reaction last night  Recommendations:  Plan stenting from the femoral approach on Monday.     Darden PalmerW. Spencer Sherryll Skoczylas, Jr.  MD [facility]  09/03/2016, 9:42 AM

## 2016-09-03 NOTE — Progress Notes (Signed)
ANTICOAGULATION CONSULT NOTE - Follow Up Consult  Pharmacy Consult for Heparin  Indication: CAD awaiting PCI  Allergies  Allergen Reactions  . Penicillins Anaphylaxis  . Sulfa Antibiotics Other (See Comments)    Hands and feet blistered   Patient Measurements: Height: 5\' 7"  (170.2 cm) Weight: 175 lb 9.5 oz (79.6 kg) IBW/kg (Calculated) : 61.6  Vital Signs: Temp: 98.1 F (36.7 C) (07/06 2008) Temp Source: Oral (07/06 2008) BP: 147/80 (07/06 2137) Pulse Rate: 56 (07/06 2137)  Labs:  Recent Labs  09/01/16 0024  09/01/16 0536  09/01/16 1428 09/02/16 0214 09/03/16 0146  HGB  --   < > 14.6  --   --  14.4 13.9  HCT  --   --  45.9  --   --  44.9 43.1  PLT  --   --  184  --   --  185 164  LABPROT  --   --  13.9  --   --   --   --   INR  --   --  1.07  --   --   --   --   HEPARINUNFRC  --   --   --   < > 0.33 0.33 0.26*  CREATININE  --   --  0.82  --   --   --  0.93  TROPONINI 0.05*  --  0.07*  --  0.06*  --   --   < > = values in this interval not displayed.  Estimated Creatinine Clearance: 58.5 mL/min (by C-G formula based on SCr of 0.93 mg/dL).  Assessment: Sub-therapeutic heparin level after heparin re-start s/p cath, plan for cath/PCI on Monday, no issues per RN.   Goal of Therapy:  Heparin level 0.3-0.7 units/ml Monitor platelets by anticoagulation protocol: Yes   Plan:  -Inc heparin to 1200 units/hr -1200 HL  Dezzie Badilla 09/03/2016,3:56 AM

## 2016-09-03 NOTE — Progress Notes (Signed)
Pt with heart rate dipping into 30s with pauses. Patient currently 5252. Sitting in bed eating lunch will monitor patient. Meryl Hubers, Randall AnKristin Jessup RN

## 2016-09-04 LAB — CBC
HCT: 44.5 % (ref 36.0–46.0)
HEMOGLOBIN: 14.4 g/dL (ref 12.0–15.0)
MCH: 31 pg (ref 26.0–34.0)
MCHC: 32.4 g/dL (ref 30.0–36.0)
MCV: 95.9 fL (ref 78.0–100.0)
PLATELETS: 173 10*3/uL (ref 150–400)
RBC: 4.64 MIL/uL (ref 3.87–5.11)
RDW: 14 % (ref 11.5–15.5)
WBC: 7.3 10*3/uL (ref 4.0–10.5)

## 2016-09-04 LAB — HEPARIN LEVEL (UNFRACTIONATED): Heparin Unfractionated: 0.63 IU/mL (ref 0.30–0.70)

## 2016-09-04 LAB — GLUCOSE, CAPILLARY: GLUCOSE-CAPILLARY: 94 mg/dL (ref 65–99)

## 2016-09-04 MED ORDER — ASPIRIN 81 MG PO CHEW
81.0000 mg | CHEWABLE_TABLET | ORAL | Status: AC
  Administered 2016-09-05: 81 mg via ORAL
  Filled 2016-09-04: qty 1

## 2016-09-04 MED ORDER — SODIUM CHLORIDE 0.9% FLUSH: 3.0000 mL | INTRAVENOUS | Status: DC | PRN

## 2016-09-04 MED ORDER — SODIUM CHLORIDE 0.9 % IV SOLN: 250.0000 mL | INTRAVENOUS | Status: DC | PRN

## 2016-09-04 MED ORDER — SODIUM CHLORIDE 0.9% FLUSH
3.0000 mL | Freq: Two times a day (BID) | INTRAVENOUS | Status: DC
  Administered 2016-09-05: 3 mL via INTRAVENOUS

## 2016-09-04 MED ORDER — SODIUM CHLORIDE 0.9 % WEIGHT BASED INFUSION
3.0000 mL/kg/h | INTRAVENOUS | Status: DC
  Administered 2016-09-05: 3 mL/kg/h via INTRAVENOUS

## 2016-09-04 MED ORDER — SODIUM CHLORIDE 0.9 % WEIGHT BASED INFUSION
1.0000 mL/kg/h | INTRAVENOUS | Status: DC
  Administered 2016-09-05: 1 mL/kg/h via INTRAVENOUS

## 2016-09-04 MED ORDER — NICOTINE 21 MG/24HR TD PT24
21.0000 mg | MEDICATED_PATCH | Freq: Every day | TRANSDERMAL | Status: DC
  Administered 2016-09-04: 21 mg via TRANSDERMAL
  Filled 2016-09-04 (×2): qty 1

## 2016-09-04 NOTE — Progress Notes (Signed)
ANTICOAGULATION CONSULT NOTE - Follow Up Consult  Pharmacy Consult for Heparin Indication: CAD awaiting PCI  Allergies  Allergen Reactions  . Penicillins Anaphylaxis  . Sulfa Antibiotics Other (See Comments)    Hands and feet blistered    Patient Measurements: Height: 5\' 7"  (170.2 cm) Weight: 175 lb 9.6 oz (79.7 kg) IBW/kg (Calculated) : 61.6  Vital Signs: Temp: 98 F (36.7 C) (07/08 0530) Temp Source: Oral (07/08 0530) BP: 133/73 (07/08 0927) Pulse Rate: 55 (07/08 0927)  Labs:  Recent Labs  09/01/16 1428  09/02/16 0214 09/03/16 0146 09/03/16 1207 09/04/16 0211  HGB  --   < > 14.4 13.9  --  14.4  HCT  --   --  44.9 43.1  --  44.5  PLT  --   --  185 164  --  173  HEPARINUNFRC 0.33  --  0.33 0.26* 0.50 0.63  CREATININE  --   --   --  0.93  --   --   TROPONINI 0.06*  --   --   --   --   --   < > = values in this interval not displayed.  Estimated Creatinine Clearance: 58.5 mL/min (by C-G formula based on SCr of 0.93 mg/dL).   Medications:  Heparin @ 1200 units/hr  Assessment: 71yof continues on heparin for CAD with complex circumflex disease awaiting PCI tomorrow. Heparin level is therapeutic at 0.63. CBC stable. No bleeding.  Goal of Therapy:  Heparin level 0.3-0.7 units/ml Monitor platelets by anticoagulation protocol: Yes   Plan:  1) Continue heparin at 1200 units/hr 2) Daily heparin level and CBC  Lisa [name], Lisa [name] 09/04/2016,11:04 AM

## 2016-09-04 NOTE — Progress Notes (Signed)
Patient complained of itching on left upper arm at site of nicotine patch. Skin appeared red under the patch. Patch removed and sited cleaned with cool water. Pt reported improvement in itching.

## 2016-09-04 NOTE — Progress Notes (Signed)
Subjective:  Had some mild sinus bradycardia yesterday.  I stopped her beta blocker and she is feeling better today.  Plan for cath through femoral approach tomorrow.  Questions answered about her cath.  Objective:  Vital Signs in the last 24 hours: BP 133/73   Pulse (!) 55   Temp 98 F (36.7 C) (Oral)   Resp 20   Ht 5\' 7"  (1.702 m)   Wt 79.7 kg (175 lb 9.6 oz)   LMP 09/01/2016   SpO2 91%   BMI 27.50 kg/m   Physical Exam: Pleasant female in no acute distress Lungs:  Clear Cardiac:  Regular rhythm, normal S1 and S2, no S3 Extremities:  Radial cath site clean and dry with very minimal ecchymoses.    Intake/Output from previous day: 07/07 0701 - 07/08 0700 In: 61.2 [I.V.:61.2] Out: -   Weight Filed Weights   09/01/16 0453 09/03/16 0443 09/04/16 0530  Weight: 79.6 kg (175 lb 9.5 oz) 80.1 kg (176 lb 9.6 oz) 79.7 kg (175 lb 9.6 oz)    Lab Results: Basic Metabolic Panel:  Recent Labs  16/11/9605/07/18 0146  NA 141  K 3.8  CL 112*  CO2 21*  GLUCOSE 109*  BUN 10  CREATININE 0.93   CBC:  Recent Labs  09/03/16 0146 09/04/16 0211  WBC 6.9 7.3  HGB 13.9 14.4  HCT 43.1 44.5  MCV 95.6 95.9  PLT 164 173   Cardiac Panel (last 3 results)  Recent Labs  09/01/16 1428  TROPONINI 0.06*    Telemetry: Personally reviewed.  Sinus rhythm with some sinus bradycardia noted last night.  Assessment/Plan:  1.  CAD with complex bifurcation disease of the circumflex awaiting stenting from the femoral approach on Monday 2.  COPD 3.  Sinus bradycardia off of beta blockers now  Recommendations:  Plan stenting from the femoral approach on Monday.  Questions answered regarding procedure.     Darden PalmerW. Spencer Meyah Corle, Jr.  MD [facility]  09/04/2016, 11:38 AM

## 2016-09-05 ENCOUNTER — Encounter (HOSPITAL_COMMUNITY): Payer: Self-pay | Admitting: Interventional Cardiology

## 2016-09-05 ENCOUNTER — Inpatient Hospital Stay (HOSPITAL_COMMUNITY)
Admission: AD | Disposition: A | Discharge status: Home / Self Care | Payer: Self-pay | Source: Other Acute Inpatient Hospital | Attending: Internal Medicine

## 2016-09-05 DIAGNOSIS — I2583 Coronary atherosclerosis due to lipid rich plaque: Secondary | ICD-10-CM

## 2016-09-05 DIAGNOSIS — I214 Non-ST elevation (NSTEMI) myocardial infarction: Principal | ICD-10-CM

## 2016-09-05 DIAGNOSIS — I2510 Atherosclerotic heart disease of native coronary artery without angina pectoris: Secondary | ICD-10-CM

## 2016-09-05 DIAGNOSIS — E785 Hyperlipidemia, unspecified: Secondary | ICD-10-CM

## 2016-09-05 HISTORY — PX: CORONARY STENT INTERVENTION: CATH118234

## 2016-09-05 LAB — CBC
HCT: 43.4 % (ref 36.0–46.0)
HEMOGLOBIN: 13.9 g/dL (ref 12.0–15.0)
MCH: 30.8 pg (ref 26.0–34.0)
MCHC: 32 g/dL (ref 30.0–36.0)
MCV: 96.2 fL (ref 78.0–100.0)
PLATELETS: 174 10*3/uL (ref 150–400)
RBC: 4.51 MIL/uL (ref 3.87–5.11)
RDW: 13.9 % (ref 11.5–15.5)
WBC: 7.8 10*3/uL (ref 4.0–10.5)

## 2016-09-05 LAB — GLUCOSE, CAPILLARY: Glucose-Capillary: 99 mg/dL (ref 65–99)

## 2016-09-05 LAB — POCT ACTIVATED CLOTTING TIME
ACTIVATED CLOTTING TIME: 296 s
ACTIVATED CLOTTING TIME: 445 s

## 2016-09-05 LAB — HEPARIN LEVEL (UNFRACTIONATED): HEPARIN UNFRACTIONATED: 0.54 [IU]/mL (ref 0.30–0.70)

## 2016-09-05 SURGERY — CORONARY STENT INTERVENTION
Anesthesia: LOCAL

## 2016-09-05 MED ORDER — IOPAMIDOL (ISOVUE-370) INJECTION 76%
INTRAVENOUS | Status: AC
  Filled 2016-09-05: qty 50

## 2016-09-05 MED ORDER — BIVALIRUDIN TRIFLUOROACETATE 250 MG IV SOLR
INTRAVENOUS | Status: AC
  Filled 2016-09-05: qty 250

## 2016-09-05 MED ORDER — SODIUM CHLORIDE 0.9 % IV SOLN
INTRAVENOUS | Status: DC | PRN
  Administered 2016-09-05: 1.75 mg/kg/h via INTRAVENOUS

## 2016-09-05 MED ORDER — ANGIOPLASTY BOOK
Freq: Once | Status: AC
  Administered 2016-09-05: 21:00:00
  Filled 2016-09-05 (×2): qty 1

## 2016-09-05 MED ORDER — SODIUM CHLORIDE 0.9 % IV SOLN
INTRAVENOUS | Status: AC | PRN
  Administered 2016-09-05: 75 mL/h via INTRAVENOUS

## 2016-09-05 MED ORDER — NITROGLYCERIN 1 MG/10 ML FOR IR/CATH LAB
INTRA_ARTERIAL | Status: AC
  Filled 2016-09-05: qty 10

## 2016-09-05 MED ORDER — IOPAMIDOL (ISOVUE-370) INJECTION 76%
INTRAVENOUS | Status: DC | PRN
  Administered 2016-09-05: 100 mL via INTRA_ARTERIAL

## 2016-09-05 MED ORDER — MIDAZOLAM HCL 2 MG/2ML IJ SOLN
INTRAMUSCULAR | Status: AC
  Filled 2016-09-05: qty 2

## 2016-09-05 MED ORDER — LABETALOL HCL 5 MG/ML IV SOLN: 10.0000 mg | INTRAVENOUS | Status: AC | PRN

## 2016-09-05 MED ORDER — NITROGLYCERIN 1 MG/10 ML FOR IR/CATH LAB
INTRA_ARTERIAL | Status: DC | PRN
  Administered 2016-09-05 (×2): 150 ug via INTRACORONARY
  Administered 2016-09-05: 200 ug via INTRACORONARY

## 2016-09-05 MED ORDER — FENTANYL CITRATE (PF) 100 MCG/2ML IJ SOLN
INTRAMUSCULAR | Status: AC
Start: 2016-09-05 — End: 2016-09-05
  Filled 2016-09-05: qty 2

## 2016-09-05 MED ORDER — SODIUM CHLORIDE 0.9 % WEIGHT BASED INFUSION: 1.0000 mL/kg/h | INTRAVENOUS | Status: AC

## 2016-09-05 MED ORDER — SODIUM CHLORIDE 0.9% FLUSH: 3.0000 mL | INTRAVENOUS | Status: DC | PRN

## 2016-09-05 MED ORDER — HYDRALAZINE HCL 20 MG/ML IJ SOLN: 5.0000 mg | INTRAMUSCULAR | Status: AC | PRN

## 2016-09-05 MED ORDER — ACTIVE PARTNERSHIP FOR HEALTH OF YOUR HEART BOOK
Freq: Once | Status: AC
  Administered 2016-09-05: 21:00:00
  Filled 2016-09-05 (×2): qty 1

## 2016-09-05 MED ORDER — SODIUM CHLORIDE 0.9 % IV SOLN: 250.0000 mL | INTRAVENOUS | Status: DC | PRN

## 2016-09-05 MED ORDER — HYDRALAZINE HCL 20 MG/ML IJ SOLN
INTRAMUSCULAR | Status: DC | PRN
  Administered 2016-09-05: 10 mg via INTRAVENOUS

## 2016-09-05 MED ORDER — BIVALIRUDIN BOLUS VIA INFUSION - CUPID
INTRAVENOUS | Status: DC | PRN
  Administered 2016-09-05: 59.775 mg via INTRAVENOUS

## 2016-09-05 MED ORDER — LIDOCAINE HCL (PF) 1 % IJ SOLN
INTRAMUSCULAR | Status: AC
  Filled 2016-09-05: qty 30

## 2016-09-05 MED ORDER — DIPHENHYDRAMINE HCL 25 MG PO CAPS
25.0000 mg | ORAL_CAPSULE | ORAL | Status: AC
  Administered 2016-09-06: 25 mg via ORAL
  Filled 2016-09-05: qty 1

## 2016-09-05 MED ORDER — HEART ATTACK BOUNCING BOOK
Freq: Once | Status: DC
  Filled 2016-09-05 (×2): qty 1

## 2016-09-05 MED ORDER — HYDRALAZINE HCL 20 MG/ML IJ SOLN
INTRAMUSCULAR | Status: AC
  Filled 2016-09-05: qty 1

## 2016-09-05 MED ORDER — FENTANYL CITRATE (PF) 100 MCG/2ML IJ SOLN
INTRAMUSCULAR | Status: DC | PRN
  Administered 2016-09-05 (×2): 25 ug via INTRAVENOUS

## 2016-09-05 MED ORDER — IOPAMIDOL (ISOVUE-370) INJECTION 76%
INTRAVENOUS | Status: AC
  Filled 2016-09-05: qty 100

## 2016-09-05 MED ORDER — MIDAZOLAM HCL 2 MG/2ML IJ SOLN
INTRAMUSCULAR | Status: DC | PRN
  Administered 2016-09-05: 2 mg via INTRAVENOUS
  Administered 2016-09-05: 1 mg via INTRAVENOUS

## 2016-09-05 MED ORDER — SODIUM CHLORIDE 0.9% FLUSH
3.0000 mL | Freq: Two times a day (BID) | INTRAVENOUS | Status: DC
  Administered 2016-09-06: 3 mL via INTRAVENOUS

## 2016-09-05 MED ORDER — HEPARIN (PORCINE) IN NACL 2-0.9 UNIT/ML-% IJ SOLN
INTRAMUSCULAR | Status: AC
  Filled 2016-09-05: qty 1000

## 2016-09-05 MED ORDER — LIDOCAINE HCL (PF) 1 % IJ SOLN
INTRAMUSCULAR | Status: DC | PRN
  Administered 2016-09-05: 15 mL via INTRADERMAL

## 2016-09-05 SURGICAL SUPPLY — 20 items
BALLN EMERGE MR 2.0X12 (BALLOONS) ×2
BALLN SAPPHIRE 2.5X12 (BALLOONS) ×2
BALLN SAPPHIRE ~~LOC~~ 2.75X12 (BALLOONS) ×2 IMPLANT
BALLOON EMERGE MR 2.0X12 (BALLOONS) ×1 IMPLANT
BALLOON SAPPHIRE 2.5X12 (BALLOONS) ×1 IMPLANT
CATH LAUNCHER 6FR EBU3.5 (CATHETERS) ×2 IMPLANT
COVER PRB 48X5XTLSCP FOLD TPE (BAG) ×1 IMPLANT
COVER PROBE 5X48 (BAG) ×1
DEVICE CLOSURE PERCLS PRGLD 6F (VASCULAR PRODUCTS) ×1 IMPLANT
KIT ENCORE 26 ADVANTAGE (KITS) ×4 IMPLANT
KIT HEART LEFT (KITS) ×2 IMPLANT
PACK CARDIAC CATHETERIZATION (CUSTOM PROCEDURE TRAY) ×2 IMPLANT
PERCLOSE PROGLIDE 6F (VASCULAR PRODUCTS) ×2
SHEATH PINNACLE 6F 10CM (SHEATH) ×2 IMPLANT
STENT SYNERGY DES 2.75X16 (Permanent Stent) ×2 IMPLANT
TRANSDUCER W/STOPCOCK (MISCELLANEOUS) ×2 IMPLANT
TUBING CIL FLEX 10 FLL-RA (TUBING) ×2 IMPLANT
WIRE COUGAR XT STRL 190CM (WIRE) ×2 IMPLANT
WIRE EMERALD 3MM-J .035X150CM (WIRE) ×2 IMPLANT
WIRE HI TORQ WHISPER MS 190CM (WIRE) ×2 IMPLANT

## 2016-09-05 NOTE — Care Management Note (Signed)
Case Management Note  Patient Details  Name: Lisa [name] MRN: [id number] Date of Birth: [birth year]  Subjective/Objective:  From home with spouse, pta indep, s/p coronary stent intervention, will be on brilinta, co pay is 3.70.  [facility] will go to [facility] [place], and they do have in stock.  NCM gave patient the 30 day savings card for brilinta.                    Action/Plan: NCM will follow for dc needs.   Expected Discharge Date:                  Expected Discharge Plan:  Home/Self Care  In-House Referral:     Discharge planning Services  CM Consult  Post Acute Care Choice:    Choice offered to:     DME Arranged:    DME Agency:     HH Arranged:    HH Agency:     Status of Service:  Completed, signed off  If discussed at [employer] Length of Stay Meetings, dates discussed:    Additional Comments:  Leone Havenaylor, Harvest Deist Clinton, RN 09/05/2016, 4:03 PM

## 2016-09-05 NOTE — Progress Notes (Signed)
Per insurance check for Brilinta # 1. S/W [employer] @ [employer] # [id number]   BRILINTA 90 MG BID   COVER- YES  CO-PAY- $ 3.70  TIER- 3 DRUG  PRIOR APPROVAL- NO   PREFERRED PHARMACY : [facility]   PATIENT ALSO HAS MEDICAID EFF-DATE 10-30-2015  CO-PAY- $ 3.70 FOR EACH RX

## 2016-09-05 NOTE — Progress Notes (Signed)
CARDIAC REHAB PHASE I   PRE:  Rate/Rhythm: 62 SR    BP: sitting 140/80    SaO2:   MODE:  Ambulation: 350 ft   POST:  Rate/Rhythm: 67 SR    BP: sitting 136/86     SaO2: 95 RA  Tolerated well. Had to rest x1 due to DOE. Slow pace. Pt is resolved to quit smoking. Will f/u tomorrow after stent. [id number]   Lisa [name] CES, ACSM 09/05/2016 9:51 AM

## 2016-09-05 NOTE — Progress Notes (Signed)
ANTICOAGULATION CONSULT NOTE - Follow Up Consult  Pharmacy Consult for Heparin Indication: CAD awaiting PCI  Allergies  Allergen Reactions  . Penicillins Anaphylaxis  . Sulfa Antibiotics Other (See Comments)    Hands and feet blistered    Patient Measurements: Height: 5\' 7"  (170.2 cm) Weight: 175 lb 9.6 oz (79.7 kg) IBW/kg (Calculated) : 61.6  Vital Signs: Temp: 98.4 F (36.9 C) (07/09 0623) Temp Source: Oral (07/09 0623) BP: 133/66 (07/09 0623) Pulse Rate: 58 (07/09 0623)  Labs:  Recent Labs  09/03/16 0146 09/03/16 1207 09/04/16 0211 09/05/16 0126  HGB 13.9  --  14.4 13.9  HCT 43.1  --  44.5 43.4  PLT 164  --  173 174  HEPARINUNFRC 0.26* 0.50 0.63 0.54  CREATININE 0.93  --   --   --     Estimated Creatinine Clearance: 58.5 mL/min (by C-G formula based on SCr of 0.93 mg/dL).   Medications:  Heparin @ 1200 units/hr  Assessment: 71yof continues on heparin for CAD with complex circumflex disease awaiting PCI later today. Heparin level is therapeutic at 0.54. CBC stable. No bleeding.  Goal of Therapy:  Heparin level 0.3-0.7 units/ml Monitor platelets by anticoagulation protocol: Yes   Plan:  1) Continue heparin at 1200 units/hr 2) Daily heparin level and CBC  Pollyann SamplesAndy Amee Boothe, PharmD, BCPS 09/05/2016, 8:51 AM

## 2016-09-05 NOTE — H&P (View-Only) (Signed)
 Progress Note  [name] Date of Encounter: 09/05/2016  Primary Cardiologist: Dr. Croitoru  Subjective   Patient doing well and denies any chest pain or SOB.  Inpatient Medications    Scheduled Meds: . aspirin EC  81 mg Oral Daily  . atorvastatin  80 mg Oral q1800  . metoprolol tartrate  12.5 mg Oral BID  . mometasone-formoterol  2 puff Inhalation BID  . nicotine  21 mg Transdermal Daily  . pantoprazole  40 mg Oral Daily  . sodium chloride flush  3 mL Intravenous Q12H  . sodium chloride flush  3 mL Intravenous Q12H  . sodium chloride flush  3 mL Intravenous Q12H  . ticagrelor  90 mg Oral BID   Continuous Infusions: . sodium chloride    . sodium chloride    . sodium chloride 1 mL/kg/hr (09/05/16 0701)  . heparin 1,200 Units/hr (09/04/16 1734)   PRN Meds: sodium chloride, sodium chloride, acetaminophen, albuterol, benzonatate, hydrOXYzine, ondansetron (ZOFRAN) IV, sodium chloride flush, sodium chloride flush   Vital Signs    Vitals:   09/04/16 1935 09/04/16 1941 09/05/16 0623 09/05/16 0832  BP:  (!) 142/79 133/66   Pulse:  (!) 51 (!) 58   Resp:  18 17   Temp:  98.4 F (36.9 C) 98.4 F (36.9 C)   TempSrc:  Oral Oral   SpO2: 95% 96% 98% 93%  Weight:      Height:        Intake/Output Summary (Last 24 hours) at 09/05/16 0849 Last data filed at 09/04/16 1700  Gross per 24 hour  Intake              240 ml  Output                0 ml  Net              240 ml   Filed Weights   09/01/16 0453 09/03/16 0443 09/04/16 0530  Weight: 175 lb 9.5 oz (79.6 kg) 176 lb 9.6 oz (80.1 kg) 175 lb 9.6 oz (79.7 kg)    Telemetry    NSR - Personally Reviewed  ECG    NSR - Personally Reviewed  Physical Exam   GEN: No acute distress.   Neck: No JVD Cardiac: RRR, no murmurs, rubs, or gallops.  Respiratory: Clear to auscultation bilaterally. GI: Soft, nontender, non-distended  MS: No edema; No deformity. Neuro:  Nonfocal  Psych: Normal affect    Labs    Chemistry Recent Labs Lab 09/01/16 0536 09/03/16 0146  NA 142 141  K 3.8 3.8  CL 108 112*  CO2 26 21*  GLUCOSE 97 109*  BUN 15 10  CREATININE 0.82 0.93  CALCIUM 8.5* 8.6*  PROT 6.2*  --   ALBUMIN 3.4*  --   AST 23  --   ALT 25  --   ALKPHOS 74  --   BILITOT 0.8  --   GFRNONAA >60 60*  GFRAA >60 >60  ANIONGAP 8 8     Hematology Recent Labs Lab 09/03/16 0146 09/04/16 0211 09/05/16 0126  WBC 6.9 7.3 7.8  RBC 4.51 4.64 4.51  HGB 13.9 14.4 13.9  HCT 43.1 44.5 43.4  MCV 95.6 95.9 96.2  MCH 30.8 31.0 30.8  MCHC 32.3 32.4 32.0  RDW 14.0 14.0 13.9  PLT 164 173 174    Cardiac Enzymes Recent Labs Lab 09/01/16 0024 09/01/16 0536 09/01/16 1428  TROPONINI 0.05* 0.07* 0.06*   No results   for input(s): TROPIPOC in the last 168 hours.   BNP Recent Labs Lab 09/01/16 0024  BNP 86.2     DDimer No results for input(s): DDIMER in the last 168 hours.   Radiology    No results found.  Cardiac Studies   2D echo 09/02/2016 Study Conclusions  - Left ventricle: The cavity size was normal. Wall thickness was   increased in a pattern of mild LVH. Systolic function was normal.   The estimated ejection fraction was in the range of 60% to 65%.   Doppler parameters are consistent with abnormal left ventricular   relaxation (grade 1 diastolic dysfunction). - Mitral valve: There was mild regurgitation. - Pulmonary arteries: PA peak pressure: 37 mm Hg (S).  Cath Conclusion 09/02/2016    Prox RCA to Mid RCA lesion, 25 %stenosed.  Ost 2nd Mrg to 2nd Mrg lesion, 90 %stenosed. Dist Cx lesion, 90 %stenosed. Complex bifurcation lesion.  The left ventricular systolic function is normal.  LV end diastolic pressure is normal.  The left ventricular ejection fraction is 55-65% by visual estimate.  There is no aortic valve stenosis.       Patient Profile     71 y.o. female with a PMH significant for COPD, current tobacco use, and GERD. She presented to  [facility] with recurrent chest pain. She was transferred here for further evaluation  Assessment & Plan    1.  ASCAD with NSTEMI - mildly elevated trop (peak 0.07) and myoview with reversible ischemia.  Cath with severe single vessel ASCAD with 90% OM2 with complex bifurcation lesion.   - Plan for PCI today.  - continue ASA/IV Heparin gtt/BB/Brilinta and statin.  2.  COPD with tobacco use - discussed smoking cessation.    3.  Hyperlipidemia - LDL goal < 70 and LDL this admit 89. - now on high dose statin.  - will need FLP and ALT in 6 weeks.   Signed, Baker Kogler, MD  09/05/2016, 8:49 AM   

## 2016-09-05 NOTE — Interval H&P Note (Signed)
Cath Lab Visit (complete for each Cath Lab visit)  Clinical Evaluation Leading to the Procedure:   ACS: Yes.    Non-ACS:    Anginal Classification: CCS IV  Anti-ischemic medical therapy: Minimal Therapy (1 class of medications)  Non-Invasive Test Results: No non-invasive testing performed  Prior CABG: No previous CABG  History and Physical Interval Note:  09/05/2016 1:31 PM  Dora Simsarolyn Mclouth  has presented today for surgery, with the diagnosis of CAD  The various methods of treatment have been discussed with the patient and family. After consideration of risks, benefits and other options for treatment, the patient has consented to  Procedure(s): Coronary Stent Intervention (N/A) as a surgical intervention .  The patient's history has been reviewed, patient examined, no change in status, stable for surgery.  I have reviewed the patient's chart and labs.  Questions were answered to the patient's satisfaction.     Tonny Bollmanooper, Maris Abascal

## 2016-09-05 NOTE — Progress Notes (Signed)
Progress Note  [name] Date of Encounter: 09/05/2016  Primary Cardiologist: Dr. Royann Shivers  Subjective   Patient doing well and denies any chest pain or SOB.  Inpatient Medications    Scheduled Meds: . aspirin EC  81 mg Oral Daily  . atorvastatin  80 mg Oral q1800  . metoprolol tartrate  12.5 mg Oral BID  . mometasone-formoterol  2 puff Inhalation BID  . nicotine  21 mg Transdermal Daily  . pantoprazole  40 mg Oral Daily  . sodium chloride flush  3 mL Intravenous Q12H  . sodium chloride flush  3 mL Intravenous Q12H  . sodium chloride flush  3 mL Intravenous Q12H  . ticagrelor  90 mg Oral BID   Continuous Infusions: . sodium chloride    . sodium chloride    . sodium chloride 1 mL/kg/hr (09/05/16 0701)  . heparin 1,200 Units/hr (09/04/16 1734)   PRN Meds: sodium chloride, sodium chloride, acetaminophen, albuterol, benzonatate, hydrOXYzine, ondansetron (ZOFRAN) IV, sodium chloride flush, sodium chloride flush   Vital Signs    Vitals:   09/04/16 1935 09/04/16 1941 09/05/16 0623 09/05/16 0832  BP:  (!) 142/79 133/66   Pulse:  (!) 51 (!) 58   Resp:  18 17   Temp:  98.4 F (36.9 C) 98.4 F (36.9 C)   TempSrc:  Oral Oral   SpO2: 95% 96% 98% 93%  Weight:      Height:        Intake/Output Summary (Last 24 hours) at 09/05/16 0849 Last data filed at 09/04/16 1700  Gross per 24 hour  Intake              240 ml  Output                0 ml  Net              240 ml   Filed Weights   09/01/16 0453 09/03/16 0443 09/04/16 0530  Weight: 175 lb 9.5 oz (79.6 kg) 176 lb 9.6 oz (80.1 kg) 175 lb 9.6 oz (79.7 kg)    Telemetry    NSR - Personally Reviewed  ECG    NSR - Personally Reviewed  Physical Exam   GEN: No acute distress.   Neck: No JVD Cardiac: RRR, no murmurs, rubs, or gallops.  Respiratory: Clear to auscultation bilaterally. GI: Soft, nontender, non-distended  MS: No edema; No deformity. Neuro:  Nonfocal  Psych: Normal affect    Labs    Chemistry Recent Labs Lab 09/01/16 0536 09/03/16 0146  NA 142 141  K 3.8 3.8  CL 108 112*  CO2 26 21*  GLUCOSE 97 109*  BUN 15 10  CREATININE 0.82 0.93  CALCIUM 8.5* 8.6*  PROT 6.2*  --   ALBUMIN 3.4*  --   AST 23  --   ALT 25  --   ALKPHOS 74  --   BILITOT 0.8  --   GFRNONAA >60 60*  GFRAA >60 >60  ANIONGAP 8 8     Hematology Recent Labs Lab 09/03/16 0146 09/04/16 0211 09/05/16 0126  WBC 6.9 7.3 7.8  RBC 4.51 4.64 4.51  HGB 13.9 14.4 13.9  HCT 43.1 44.5 43.4  MCV 95.6 95.9 96.2  MCH 30.8 31.0 30.8  MCHC 32.3 32.4 32.0  RDW 14.0 14.0 13.9  PLT 164 173 174    Cardiac Enzymes Recent Labs Lab 09/01/16 0024 09/01/16 0536 09/01/16 1428  TROPONINI 0.05* 0.07* 0.06*   No results  for input(s): TROPIPOC in the last 168 hours.   BNP Recent Labs Lab 09/01/16 0024  BNP 86.2     DDimer No results for input(s): DDIMER in the last 168 hours.   Radiology    No results found.  Cardiac Studies   2D echo 09/02/2016 Study Conclusions  - Left ventricle: The cavity size was normal. Wall thickness was   increased in a pattern of mild LVH. Systolic function was normal.   The estimated ejection fraction was in the range of 60% to 65%.   Doppler parameters are consistent with abnormal left ventricular   relaxation (grade 1 diastolic dysfunction). - Mitral valve: There was mild regurgitation. - Pulmonary arteries: PA peak pressure: 37 mm Hg (S).  Cath Conclusion 09/02/2016    Prox RCA to Mid RCA lesion, 25 %stenosed.  Ost 2nd Mrg to 2nd Mrg lesion, 90 %stenosed. Dist Cx lesion, 90 %stenosed. Complex bifurcation lesion.  The left ventricular systolic function is normal.  LV end diastolic pressure is normal.  The left ventricular ejection fraction is 55-65% by visual estimate.  There is no aortic valve stenosis.       Patient Profile     71 y.o. female with a PMH significant for COPD, current tobacco use, and GERD. She presented to  [facility] with recurrent chest pain. She was transferred here for further evaluation  Assessment & Plan    1.  ASCAD with NSTEMI - mildly elevated trop (peak 0.07) and myoview with reversible ischemia.  Cath with severe single vessel ASCAD with 90% OM2 with complex bifurcation lesion.   - Plan for PCI today.  - continue ASA/IV Heparin gtt/BB/Brilinta and statin.  2.  COPD with tobacco use - discussed smoking cessation.    3.  Hyperlipidemia - LDL goal < 70 and LDL this admit 89. - now on high dose statin.  - will need FLP and ALT in 6 weeks.   Signed, Armanda Magicraci Marysue Fait, MD  09/05/2016, 8:49 AM

## 2016-09-06 ENCOUNTER — Encounter (HOSPITAL_COMMUNITY): Payer: Self-pay | Admitting: Cardiovascular Disease

## 2016-09-06 DIAGNOSIS — I25110 Atherosclerotic heart disease of native coronary artery with unstable angina pectoris: Secondary | ICD-10-CM

## 2016-09-06 DIAGNOSIS — I214 Non-ST elevation (NSTEMI) myocardial infarction: Secondary | ICD-10-CM

## 2016-09-06 DIAGNOSIS — E785 Hyperlipidemia, unspecified: Secondary | ICD-10-CM

## 2016-09-06 DIAGNOSIS — E876 Hypokalemia: Secondary | ICD-10-CM

## 2016-09-06 LAB — BASIC METABOLIC PANEL
Anion gap: 6 (ref 5–15)
BUN: 11 mg/dL (ref 6–20)
CALCIUM: 9.1 mg/dL (ref 8.9–10.3)
CO2: 27 mmol/L (ref 22–32)
CREATININE: 0.79 mg/dL (ref 0.44–1.00)
Chloride: 109 mmol/L (ref 101–111)
Glucose, Bld: 112 mg/dL — ABNORMAL HIGH (ref 65–99)
Potassium: 3.4 mmol/L — ABNORMAL LOW (ref 3.5–5.1)
SODIUM: 142 mmol/L (ref 135–145)

## 2016-09-06 LAB — CBC
HCT: 43.9 % (ref 36.0–46.0)
Hemoglobin: 13.9 g/dL (ref 12.0–15.0)
MCH: 30.5 pg (ref 26.0–34.0)
MCHC: 31.7 g/dL (ref 30.0–36.0)
MCV: 96.3 fL (ref 78.0–100.0)
Platelets: 158 10*3/uL (ref 150–400)
RBC: 4.56 MIL/uL (ref 3.87–5.11)
RDW: 14.2 % (ref 11.5–15.5)
WBC: 7.4 10*3/uL (ref 4.0–10.5)

## 2016-09-06 MED ORDER — ASPIRIN 81 MG PO TBEC: 81.0000 mg | DELAYED_RELEASE_TABLET | Freq: Every day | ORAL | 3 refills | Status: DC

## 2016-09-06 MED ORDER — NITROGLYCERIN 0.4 MG SL SUBL: 0.4000 mg | SUBLINGUAL_TABLET | SUBLINGUAL | Status: DC | PRN

## 2016-09-06 MED ORDER — POTASSIUM CHLORIDE CRYS ER 20 MEQ PO TBCR
40.0000 meq | EXTENDED_RELEASE_TABLET | Freq: Once | ORAL | Status: AC
  Administered 2016-09-06: 40 meq via ORAL
  Filled 2016-09-06: qty 2

## 2016-09-06 MED ORDER — TICAGRELOR 90 MG PO TABS: 90.0000 mg | ORAL_TABLET | Freq: Two times a day (BID) | ORAL | 3 refills | Status: DC

## 2016-09-06 MED ORDER — NITROGLYCERIN 0.4 MG SL SUBL: 0.4000 mg | SUBLINGUAL_TABLET | SUBLINGUAL | 1 refills | Status: DC | PRN

## 2016-09-06 MED ORDER — ATORVASTATIN CALCIUM 80 MG PO TABS: 80.0000 mg | ORAL_TABLET | Freq: Every day | ORAL | 3 refills | Status: DC

## 2016-09-06 MED ORDER — TICAGRELOR 90 MG PO TABS: 90.0000 mg | ORAL_TABLET | Freq: Two times a day (BID) | ORAL | 0 refills | Status: DC

## 2016-09-06 NOTE — Care Management Important Message (Signed)
Important Message  Patient Details  Name: Lisa [name] MRN: [id number] Date of Birth: 12/12/1945   Medicare Important Message Given:  Yes    Lisa [name] 09/06/2016, 1:29 PM

## 2016-09-06 NOTE — Progress Notes (Signed)
CARDIAC REHAB PHASE I   PRE:  Rate/Rhythm: 63 SR  BP:  Supine:   Sitting: 159/79  Standing:    SaO2: 95%RA  MODE:  Ambulation: 500 ft   POST:  Rate/Rhythm: 80 SR  BP:  Supine:   Sitting: 142/57  Standing:    SaO2: 95%RA 0750-0900 Pt walked 500 ft with slow steady gait. Stated breathing better. No CP. Education completed re risk factors, heart healthy diet, ex ed, NTG use, not smoking and restrictions. Gave pt smoking cessation handout and she stated she had quit cold [place]. Since question as if pt had very small MI or not, gave MI booklet for information. Discussed CRP 2 and [facility]. Discussed importance of brilinta with stent. Pt stated she had brilinta card.   Lisa Nuttingharlene Cable [name], RN BSN  09/06/2016 8:59 AM

## 2016-09-06 NOTE — Progress Notes (Signed)
Progress Note  Patient Name: Lisa SimsCarolyn [name] Date of Encounter: 09/06/2016  Primary Cardiologist: Dr. Tenny Crawoss  Subjective   No complaints of CP or SOB  Inpatient Medications    Scheduled Meds: . aspirin EC  81 mg Oral Daily  . atorvastatin  80 mg Oral q1800  . heart attack bouncing book   Does not apply Once  . metoprolol tartrate  12.5 mg Oral BID  . mometasone-formoterol  2 puff Inhalation BID  . nicotine  21 mg Transdermal Daily  . pantoprazole  40 mg Oral Daily  . sodium chloride flush  3 mL Intravenous Q12H  . sodium chloride flush  3 mL Intravenous Q12H  . sodium chloride flush  3 mL Intravenous Q12H  . ticagrelor  90 mg Oral BID   Continuous Infusions: . sodium chloride    . sodium chloride     PRN Meds: sodium chloride, sodium chloride, acetaminophen, albuterol, benzonatate, hydrOXYzine, ondansetron (ZOFRAN) IV, sodium chloride flush, sodium chloride flush   Vital Signs    Vitals:   09/05/16 1530 09/05/16 1954 09/05/16 2000 09/06/16 0605  BP: (!) 149/81 (!) 110/58  (!) 143/59  Pulse: (!) 53 (!) 56  (!) 50  Resp: 17 17 14 17   Temp: 97.6 F (36.4 C) 98.1 F (36.7 C)  (!) 97.5 F (36.4 C)  TempSrc: Axillary Oral  Oral  SpO2: 98% 96%  96%  Weight:    171 lb 15.3 oz (78 kg)  Height:        Intake/Output Summary (Last 24 hours) at 09/06/16 0825 Last data filed at 09/06/16 0100  Gross per 24 hour  Intake           737.23 ml  Output              600 ml  Net           137.23 ml   Filed Weights   09/03/16 0443 09/04/16 0530 09/06/16 0605  Weight: 176 lb 9.6 oz (80.1 kg) 175 lb 9.6 oz (79.7 kg) 171 lb 15.3 oz (78 kg)    Telemetry    NSR - Personally Reviewed  ECG    NSR - Personally Reviewed  Physical Exam   GEN: No acute distress.   Neck: No JVD Cardiac: RRR, no murmurs, rubs, or gallops.  Respiratory: Clear to auscultation bilaterally. GI: Soft, nontender, non-distended  MS: No edema; No deformity. Neuro:  Nonfocal  Psych: Normal affect     Labs    Chemistry Recent Labs Lab 09/01/16 0536 09/03/16 0146 09/06/16 0221  NA 142 141 142  K 3.8 3.8 3.4*  CL 108 112* 109  CO2 26 21* 27  GLUCOSE 97 109* 112*  BUN 15 10 11   CREATININE 0.82 0.93 0.79  CALCIUM 8.5* 8.6* 9.1  PROT 6.2*  --   --   ALBUMIN 3.4*  --   --   AST 23  --   --   ALT 25  --   --   ALKPHOS 74  --   --   BILITOT 0.8  --   --   GFRNONAA >60 60* >60  GFRAA >60 >60 >60  ANIONGAP 8 8 6      Hematology Recent Labs Lab 09/04/16 0211 09/05/16 0126 09/06/16 0221  WBC 7.3 7.8 7.4  RBC 4.64 4.51 4.56  HGB 14.4 13.9 13.9  HCT 44.5 43.4 43.9  MCV 95.9 96.2 96.3  MCH 31.0 30.8 30.5  MCHC 32.4 32.0 31.7  RDW 14.0 13.9  14.2  PLT 173 174 158    Cardiac Enzymes Recent Labs Lab 09/01/16 0024 09/01/16 0536 09/01/16 1428  TROPONINI 0.05* 0.07* 0.06*   No results for input(s): TROPIPOC in the last 168 hours.   BNP Recent Labs Lab 09/01/16 0024  BNP 86.2     DDimer No results for input(s): DDIMER in the last 168 hours.   Radiology    No results found.  Cardiac Studies   2D echo 09/02/2016 Study Conclusions  - Left ventricle: The cavity size was normal. Wall thickness was increased in a pattern of mild LVH. Systolic function was normal. The estimated ejection fraction was in the range of 60% to 65%. Doppler parameters are consistent with abnormal left ventricular relaxation (grade 1 diastolic dysfunction). - Mitral valve: There was mild regurgitation. - Pulmonary arteries: PA peak pressure: 37 mm Hg (S).  Cath Conclusion 09/02/2016    Prox RCA to Mid RCA lesion, 25 %stenosed.  Ost 2nd Mrg to 2nd Mrg lesion, 90 %stenosed. Dist Cx lesion, 90 %stenosed. Complex bifurcation lesion.  The left ventricular systolic function is normal.  LV end diastolic pressure is normal.  The left ventricular ejection fraction is 55-65% by visual estimate.  There is no aortic valve stenosis.     Patient Profile     71 y.o. female  with a PMH significant for COPD, current tobacco use, and GERD. She presented to [facility] with recurrent chest pain. She was transferred here for further evaluation  Assessment & Plan    1.  ASCAD with NSTEMI - mildly elevated trop (peak 0.07) and myoview with reversible ischemia.  Cath with severe single vessel ASCAD with 90% OM2 with complex bifurcation lesion.   - s/p PCI of LCx/OM2 yesterday.   - creatinine stable post cath at 0.79 - continue ASA/BB/Brilinta and statin.  2.  COPD with tobacco use - discussed smoking cessation.    3.  Hyperlipidemia - LDL goal < 70 and LDL this admit 89. - now on high dose statin.  - will need FLP and ALT in 6 weeks.   4.  Hypokalemia  - replete with Kdur this am  Patient is stable from a cardiac standpoint post PCI.  He is stable for discharge today. He will have TOC followup in 7-10 days.   Signed, Armanda Magic, MD  09/06/2016, 8:25 AM

## 2016-09-06 NOTE — Discharge Summary (Signed)
Discharge Summary    Patient ID: Lisa [name],  MRN: [id number], DOB/AGE: [birth year] 71 y.o.  Admit date: 08/31/2016 Discharge date: 09/06/2016   Primary Care Provider: Ignatius Specking Primary Cardiologist: Dr. Tenny Craw  Discharge Diagnoses    Principal Problem:   Non-ST elevation (NSTEMI) myocardial infarction [facility]) Active Problems:   Unstable angina (HCC)   GERD (gastroesophageal reflux disease)   COPD (chronic obstructive pulmonary disease) (HCC)   Tobacco abuse   Chronic cough   Coronary artery disease involving native coronary artery of native heart with unstable angina pectoris (HCC)   Hyperlipidemia LDL goal <70   Hypokalemia   Allergies Allergies  Allergen Reactions  . Penicillins Anaphylaxis  . Sulfa Antibiotics Other (See Comments)    Hands and feet blistered     History of Present Illness    Lisa [name] is a 71 y.o. female with a PMH significant for COPD, current tobacco use, and GERD. She presented to [facility] with recurrent chest pain. She was transferred to [facility] for further evaluation  Ms. Dowdy states she had recurrent chest pain yesterday 08/31/16 after eating a fatty meal. The pain was located substernally, in her central chest, and radiated to her back. The chest pain lasted about 30 min and was rated as a 10/10. She denied nausea/vomiting, dizziness, and feelings of syncope. She was originally scheduled for a stress test next week, but reported to the ED for recurrence of her chest pain. Upon arrival, troponins were cycled and she was started on heparin drip. She was chest pain-free on exam.  She has a history of smoking and a family history of early-onset heart disease (father and brother died in 59s).  Hospital Course     Consultants: None  NSTEMI Ms Potenza was admitted and underwent a stress myoview that showed reversible ischemia. She subsequently underwent heart catheterization which showed 90% occlusion of the circumflex/OM  bifurcation requiring complex PCI and stent placement. She tolerated the procedures well. Overnight, following second cath, she had a 3 sec pause and bradycardia in the 30s while sleeping. She was sleeping and asymptomatic. Lopressor was held at discharge for this nocturnal bradycardia. She is currently chest pain-free.  HLD Discharged on high dose statin. Will repeat lipid panel and LFTs in 6 weeks  Hypokalemia K 3.4, replaced with Kdur PO  Patient seen and examined by Dr. Mayford Knife today and was stable for discharge. All follow up has been arranged.  _____________  Discharge Vitals Blood pressure (!) 156/72, pulse 62, temperature 97.8 F (36.6 C), temperature source Oral, resp. rate (!) 22, height 5\' 7"  (1.702 m), weight 171 lb 15.3 oz (78 kg), last menstrual period 09/01/2016, SpO2 93 %.  Filed Weights   09/03/16 0443 09/04/16 0530 09/06/16 0605  Weight: 176 lb 9.6 oz (80.1 kg) 175 lb 9.6 oz (79.7 kg) 171 lb 15.3 oz (78 kg)    Labs & Radiologic Studies    CBC  Recent Labs  09/05/16 0126 09/06/16 0221  WBC 7.8 7.4  HGB 13.9 13.9  HCT 43.4 43.9  MCV 96.2 96.3  PLT 174 158   Basic Metabolic Panel  Recent Labs  09/06/16 0221  NA 142  K 3.4*  CL 109  CO2 27  GLUCOSE 112*  BUN 11  CREATININE 0.79  CALCIUM 9.1   Liver Function Tests No results for input(s): AST, ALT, ALKPHOS, BILITOT, PROT, ALBUMIN in the last 72 hours. No results for input(s): LIPASE, AMYLASE in the last 72 hours. Cardiac Enzymes  No results for input(s): CKTOTAL, CKMB, CKMBINDEX, TROPONINI in the last 72 hours. BNP Invalid input(s): POCBNP D-Dimer No results for input(s): DDIMER in the last 72 hours. Hemoglobin A1C No results for input(s): HGBA1C in the last 72 hours. Fasting Lipid Panel No results for input(s): CHOL, HDL, LDLCALC, TRIG, CHOLHDL, LDLDIRECT in the last 72 hours. Thyroid Function Tests No results for input(s): TSH, T4TOTAL, T3FREE, THYROIDAB in the last 72 hours.  Invalid  input(s): FREET3 _____________  Nm Myocar Multi W/spect W/wall Motion / Ef  Result Date: 09/01/2016  There was no ST segment deviation noted during stress.  Findings consistent with ischemia.  This is an intermediate risk study.  The left ventricular ejection fraction is normal (55-65%).  1. EF 65%, normal wall motion. 2. There was a primarily reversible, medium-sized, mild basal to mid inferolateral perfusion defect.  This is concerning for ischemia. Intermediate risk study.      Diagnostic Studies/Procedures    Heart catheterization 09/05/16: Successful complex PCI of the circumflex/OM bifurcation using a Synergy DES  Recommend: ASA/Brilinta at least 12 months. Should be ok for discharge tomorrow if no complications arise.   Heart catheterization 09/02/16:  Prox RCA to Mid RCA lesion, 25 %stenosed.  Ost 2nd Mrg to 2nd Mrg lesion, 90 %stenosed. Dist Cx lesion, 90 %stenosed. Complex bifurcation lesion.  The left ventricular systolic function is normal.  LV end diastolic pressure is normal.  The left ventricular ejection fraction is 55-65% by visual estimate.  There is no aortic valve stenosis.   Unable to pass 6 Fr catheter through radial approach due to anatomic reasons.    Patient loaded with Brilinta.  Plan for cath/PCI via femoral approach on Monday.     Diagnostic Diagram          Myoview 09/01/16:  There was no ST segment deviation noted during stress.  Findings consistent with ischemia.  This is an intermediate risk study.  The left ventricular ejection fraction is normal (55-65%).   1. EF 65%, normal wall motion.  2. There was a primarily reversible, medium-sized, mild basal to mid inferolateral perfusion defect.  This is concerning for ischemia.   Intermediate risk study.      Echocardiogram 09/02/16: Study Conclusions - Left ventricle: The cavity size was normal. Wall thickness was   increased in a pattern of mild LVH. Systolic function was  normal.   The estimated ejection fraction was in the range of 60% to 65%.   Doppler parameters are consistent with abnormal left ventricular   relaxation (grade 1 diastolic dysfunction). - Mitral valve: There was mild regurgitation. - Pulmonary arteries: PA peak pressure: 37 mm Hg (S).   Disposition   Pt is being discharged home today in good condition.  Follow-up Plans & Appointments    Follow-up Information    [place], Sharrell KuBhavinkumar, [place] Follow up on 09/13/2016.   Specialty:  Cardiology Why:  11:00 am for TCM and hospital follow up NSTEMI Contact information: [address] [place] [address] [phone]          Discharge Instructions    Amb Referral to Cardiac Rehabilitation    Complete by:  As directed    Diagnosis:  Coronary Stents   Diet - low sodium heart healthy    Complete by:  As directed    Discharge instructions    Complete by:  As directed    No driving for 1 week. No lifting over 10 lbs for 2 weeks. No sexual activity for  2 weeks.  Keep procedure site clean & dry. If you notice increased pain, swelling, bleeding or pus, call/return!  You may shower, but no soaking baths/hot tubs/pools for 1 week.   Increase activity slowly    Complete by:  As directed       Discharge Medications   Current Discharge Medication List    START taking these medications   Details  aspirin EC 81 MG EC tablet Take 1 tablet (81 mg total) by mouth daily. Qty: 90 tablet, Refills: 3    atorvastatin (LIPITOR) 80 MG tablet Take 1 tablet (80 mg total) by mouth daily at 6 PM. Qty: 30 tablet, Refills: 3    nitroGLYCERIN (NITROSTAT) 0.4 MG SL tablet Place 1 tablet (0.4 mg total) under the tongue every 5 (five) minutes as needed for chest pain. Qty: 25 tablet, Refills: 1    ticagrelor (BRILINTA) 90 MG TABS tablet Take 1 tablet (90 mg total) by mouth 2 (two) times daily. Qty: 180 tablet, Refills: 3      CONTINUE these medications which have NOT CHANGED   Details   ADVAIR DISKUS 250-50 MCG/DOSE AEPB Inhale 1 puff into the lungs at bedtime as needed (for shortness of breath).     Chlorpheniramine Maleate (ALLERGY PO) Take 1 tablet by mouth daily. Equate brand    Cholecalciferol (VITAMIN D3) 2000 units TABS Take 2,000 Units by mouth daily.    esomeprazole (NEXIUM) 40 MG capsule Take 40 mg by mouth daily.    Glucosamine-Chondroitin (MOVE FREE PO) Take 1 tablet by mouth daily.    Omega-3 Fatty Acids (FISH OIL OMEGA-3 PO) Take 1 capsule by mouth daily.    Probiotic Product (PROBIOTIC PO) Take 1 capsule by mouth daily.    VENTOLIN HFA 108 (90 Base) MCG/ACT inhaler Inhale 2 puffs into the lungs every 6 (six) hours as needed.    vitamin B-12 (CYANOCOBALAMIN) 500 MCG tablet Take 500 mcg by mouth daily.    hydrOXYzine (ATARAX/VISTARIL) 25 MG tablet Take 1 tablet (25 mg total) by mouth every 6 (six) hours as needed for itching. Qty: 12 tablet, Refills: 0         Aspirin prescribed at discharge?  Yes High Intensity Statin Prescribed? (Lipitor 40-80mg  or Crestor 20-40mg ): Yes Beta Blocker Prescribed? No: bradycardia For EF <40%, was ACEI/ARB Prescribed? No: EF > 40% ADP Receptor Inhibitor Prescribed? (i.e. Plavix etc.-Includes Medically Managed Patients): Yes For EF <40%, Aldosterone Inhibitor Prescribed? No: EF > 40% Was EF assessed during THIS hospitalization? Yes Was Cardiac Rehab II ordered? (Included Medically managed Patients): Yes   Outstanding Labs/Studies   Lipid panel and LFT in 6 weeks. BMP for inpatient hypokalemia  Duration of Discharge Encounter   Greater than 30 minutes including physician time.  Signed, Roe Rutherford Coumba Kellison PA-C 09/06/2016, 11:56 AM

## 2016-09-07 MED FILL — Heparin Sodium (Porcine) 2 Unit/ML in Sodium Chloride 0.9%: INTRAMUSCULAR | Qty: 1000 | Status: AC

## 2016-09-11 NOTE — Progress Notes (Signed)
Cardiology Office Note    Date:  09/13/2016   ID:  Lisa SimsCarolyn Stutz, DOB January 16, 1946, MRN [id number]  PCP:  Ignatius SpeckingVyas, Dhruv B, MD  Cardiologist:  Dr. Tenny Crawoss  Chief Complaint: Hospital follow up for NSTEMI  History of Present Illness:   Lisa [name] is a 71 y.o. female COPD, current tobacco use, GERD and new dx of CAD presents for follow up.   Admitted 08/2016 with NSTEMI. She underwent a stress myoview that showed reversible ischemia. She subsequently underwent heart catheterization which showed 90% occlusion of the circumflex/OM bifurcation requiring complex PCI and stent placement.  Overnight, following second cath, she had a 3 sec pause and bradycardia in the 30s while sleeping. She was sleeping and asymptomatic. Lopressor was held at discharge for this nocturnal bradycardia.  Patient is here for follow-up. Walking without angina. No orthopnea, PND, syncope, palpitations, lower extremity edema, melena or blood in her stool or urine. She has intermittent shortness of breath, lasts for a few seconds. This has been improved significantly. She as sore throat and mild cough. No fever or chills. Salt water gargle helps.  Past Medical History:  Diagnosis Date  . COPD (chronic obstructive pulmonary disease) (HCC)   . Coronary artery disease   . Cough    " ALL MY LIFE "  . Dyspnea   . GERD (gastroesophageal reflux disease)   . NSTEMI (non-ST elevated myocardial infarction) (HCC) 09/01/2016   DES to Cx/OM bifurcation  . Unstable angina (HCC) 08/2016    Past Surgical History:  Procedure Laterality Date  . ABDOMINAL HYSTERECTOMY    . CORONARY STENT INTERVENTION  09/05/2016   Successful complex PCI of the circumflex/OM bifurcation using a Synergy DES  . CORONARY STENT INTERVENTION N/A 09/05/2016   Procedure: Coronary Stent Intervention;  Surgeon: Tonny Bollmanooper, Michael, MD;  [facility];  Service: Cardiovascular;  Laterality: N/A;  . LEFT HEART CATH AND CORONARY ANGIOGRAPHY N/A  09/02/2016   Procedure: Left Heart Cath and Coronary Angiography;  Surgeon: Corky CraftsVaranasi, Jayadeep S, MD;  [facility];  Service: Cardiovascular;  Laterality: N/A;    Current Medications: Prior to Admission medications   Medication Sig Start Date End Date Taking? Authorizing Provider  ADVAIR DISKUS 250-50 MCG/DOSE AEPB Inhale 1 puff into the lungs at bedtime as needed (for shortness of breath).  08/11/16   [provider]  aspirin EC 81 MG EC tablet Take 1 tablet (81 mg total) by mouth daily. 09/07/16   Duke, Roe RutherfordAngela Nicole, PA  atorvastatin (LIPITOR) 80 MG tablet Take 1 tablet (80 mg total) by mouth daily at 6 PM. 09/06/16   [facility]uke, Roe RutherfordAngela Nicole, PA  Chlorpheniramine Maleate (ALLERGY PO) Take 1 tablet by mouth daily. Equate brand    [provider]  Cholecalciferol (VITAMIN D3) 2000 units TABS Take 2,000 Units by mouth daily.    [provider]  esomeprazole (NEXIUM) 40 MG capsule Take 40 mg by mouth daily. 08/17/16   [provider]  Glucosamine-Chondroitin (MOVE FREE PO) Take 1 tablet by mouth daily.    [provider]  hydrOXYzine (ATARAX/VISTARIL) 25 MG tablet Take 1 tablet (25 mg total) by mouth every 6 (six) hours as needed for itching. Patient not taking: Reported on 09/01/2016 11/27/15   Gilda CreasePollina, Christopher J, MD  nitroGLYCERIN (NITROSTAT) 0.4 MG SL tablet Place 1 tablet (0.4 mg total) under the tongue every 5 (five) minutes as needed for chest pain. 09/06/16   Marcelino Dusteruke, Angela Nicole, PA  Omega-3 Fatty Acids (FISH OIL  OMEGA-3 PO) Take 1 capsule by mouth daily.    [provider]  Probiotic Product (PROBIOTIC PO) Take 1 capsule by mouth daily.    [provider]  ticagrelor (BRILINTA) 90 MG TABS tablet Take 1 tablet (90 mg total) by mouth 2 (two) times daily. 09/06/16   Duke, Roe Rutherford, PA  VENTOLIN HFA 108 (90 Base) MCG/ACT inhaler Inhale 2 puffs into the lungs every 6 (six) hours as needed. 08/17/16   [provider]  vitamin B-12 (CYANOCOBALAMIN) 500 MCG tablet Take 500 mcg by mouth daily.    [provider]    Allergies:   Penicillins and Sulfa antibiotics   Social History   Social History  . Marital status: Divorced    Spouse name: N/A  . Number of children: N/A  . Years of education: N/A   Social History Main Topics  . Smoking status: Former Smoker    Packs/day: 0.50    Years: 50.00    Types: Cigarettes    Quit date: 08/31/2016  . Smokeless tobacco: Never Used  . Alcohol use Yes     Comment: occas  . Drug use: No  . Sexual activity: Not Asked   Other Topics Concern  . None   Social History Narrative  . None     Family History:  The patient's family history includes Asthma in her mother; Cancer in her other; Heart attack in her father.   ROS:   Please see the history of present illness.    ROS All other systems reviewed and are negative.   PHYSICAL EXAM:   VS:  BP 128/78   Pulse 72   Ht 5' 6.5" (1.689 m)   Wt 176 lb (79.8 kg)   LMP 09/01/2016   SpO2 94%   BMI 27.98 kg/m    GEN: Well nourished, well developed, in no acute distress  HEENT: redness on throat Neck: no JVD, carotid bruits, or masses Cardiac:RRR; no murmurs, rubs, or gallops,no edema  Respiratory:  clear to auscultation bilaterally, normal work of breathing GI: soft, nontender, nondistended, + BS MS: no deformity or atrophy  Skin: warm and dry, no rash Neuro:  Alert and Oriented x 3, Strength and sensation are intact Psych: euthymic mood, full affect  Wt Readings from Last 3 Encounters:  09/13/16 176 lb (79.8 kg)  09/06/16 171 lb 15.3 oz (78 kg)  11/27/15 171 lb (77.6 kg)      Studies/Labs Reviewed:   EKG:  EKG is not  ordered today.   Recent Labs: 09/01/2016: ALT 25; B Natriuretic Peptide 86.2; TSH 4.634 09/06/2016: BUN 11; Creatinine, Ser 0.79; Hemoglobin 13.9; Platelets 158; Potassium 3.4; Sodium 142   Lipid Panel    Component Value Date/Time   CHOL 148 09/01/2016 0024   TRIG  88 09/01/2016 0024   HDL 41 09/01/2016 0024   CHOLHDL 3.6 09/01/2016 0024   VLDL 18 09/01/2016 0024   LDLCALC 89 09/01/2016 0024    Additional studies/ records that were reviewed today include:   Heart catheterization 09/05/16: Successful complex PCI of the circumflex/OM bifurcation using a Synergy DES  Recommend: ASA/Brilinta at least 12 months. Should be ok for discharge tomorrow if no complications arise.   Heart catheterization 09/02/16:  Prox RCA to Mid RCA lesion, 25 %stenosed.  Ost 2nd Mrg to 2nd Mrg lesion, 90 %stenosed. Dist Cx lesion, 90 %stenosed. Complex bifurcation lesion.  The left ventricular systolic function is normal.  LV end diastolic pressure is normal.  The left ventricular  ejection fraction is 55-65% by visual estimate.  There is no aortic valve stenosis.  Unable to pass 6 Fr catheter through radial approach due to anatomic reasons.   Patient loaded with Brilinta. Plan for cath/PCI via femoral approach on Monday.    Diagnostic Diagram          Myoview 09/01/16:  There was no ST segment deviation noted during stress.  Findings consistent with ischemia.  This is an intermediate risk study.  The left ventricular ejection fraction is normal (55-65%).  1. EF 65%, normal wall motion.  2. There was a primarily reversible, medium-sized, mild basal to mid inferolateral perfusion defect. This is concerning for ischemia.   Intermediate risk study.     Echocardiogram 09/02/16: Study Conclusions - Left ventricle: The cavity size was normal. Wall thickness was increased in a pattern of mild LVH. Systolic function was normal. The estimated ejection fraction was in the range of 60% to 65%. Doppler parameters are consistent with abnormal left ventricular relaxation (grade 1 diastolic dysfunction). - Mitral valve: There was mild regurgitation. - Pulmonary arteries: PA peak pressure: 37 mm Hg (S).      ASSESSMENT & PLAN:     1. CAD s/p DES to Cx/OM bifurcation - No BB due to noctural bradycardia with 3sec pause. Continue ASA, Brillinta and statin. No angina. Her intermittent shortness of breath is likely due to Brilinta. This has been improved significantly. She will give [place] a call if worsened symptoms or no improvement.  2. HLD - 09/01/2016: Cholesterol 148; HDL 41; LDL Cholesterol 89; Triglycerides 88; VLDL 18  - Continue statin. Lipid panel and LFT in 4 weeks.   3. Tobacco smoking - She has quit after 52 years. Congratulated.   4. Post nasal drainage - Continue salt water gargles. If no improvement, follow-up with PCP.  Medication Adjustments/Labs and Tests Ordered: Current medicines are reviewed at length with the patient today.  Concerns regarding medicines are outlined above.  Medication changes, Labs and Tests ordered today are listed in the Patient Instructions below. Patient Instructions  Medication Instructions:   Your physician recommends that you continue on your current medications as directed. Please refer to the Current Medication list given to you today.   If you need a refill on your cardiac medications before your next appointment, please call your pharmacy.  Labwork: RETURN IN 6 WEEKS FOR FASTING LIPID AND LFT    Testing/Procedures: NONE ORDERED  TODAY    Follow-Up:  WITH DR ROSS IN 3 TO 4 MONTHS    Any Other Special Instructions Will Be Listed Below (If Applicable).                                                                                                                                                      Lorelei Pont, PA  09/13/2016 11:20  AM    [facility] [place], [place], [place]  [address] Phone: [phone]; Fax: [phone]

## 2016-09-13 ENCOUNTER — Ambulatory Visit (INDEPENDENT_AMBULATORY_CARE_PROVIDER_SITE_OTHER): Payer: MEDICARE | Admitting: Physician Assistant

## 2016-09-13 ENCOUNTER — Encounter: Payer: Self-pay | Admitting: Physician Assistant

## 2016-09-13 VITALS — BP 128/78 | HR 72 | Ht 66.5 in | Wt 176.0 lb

## 2016-09-13 DIAGNOSIS — E785 Hyperlipidemia, unspecified: Secondary | ICD-10-CM | POA: Diagnosis not present

## 2016-09-13 DIAGNOSIS — I2510 Atherosclerotic heart disease of native coronary artery without angina pectoris: Secondary | ICD-10-CM | POA: Diagnosis not present

## 2016-09-13 DIAGNOSIS — I200 Unstable angina: Secondary | ICD-10-CM

## 2016-09-13 DIAGNOSIS — R0982 Postnasal drip: Secondary | ICD-10-CM

## 2016-09-13 DIAGNOSIS — I214 Non-ST elevation (NSTEMI) myocardial infarction: Secondary | ICD-10-CM

## 2016-09-13 NOTE — Patient Instructions (Signed)
Medication Instructions:   Your physician recommends that you continue on your current medications as directed. Please refer to the Current Medication list given to you today.   If you need a refill on your cardiac medications before your next appointment, please call your pharmacy.  Labwork: RETURN IN 6 WEEKS FOR FASTING LIPID AND LFT    Testing/Procedures: NONE ORDERED  TODAY    Follow-Up:  WITH DR ROSS IN 3 TO 4 MONTHS    Any Other Special Instructions Will Be Listed Below (If Applicable).

## 2016-09-14 DIAGNOSIS — I2510 Atherosclerotic heart disease of native coronary artery without angina pectoris: Secondary | ICD-10-CM | POA: Diagnosis not present

## 2016-09-14 DIAGNOSIS — K219 Gastro-esophageal reflux disease without esophagitis: Secondary | ICD-10-CM | POA: Diagnosis not present

## 2016-09-14 DIAGNOSIS — J449 Chronic obstructive pulmonary disease, unspecified: Secondary | ICD-10-CM | POA: Diagnosis not present

## 2016-09-14 DIAGNOSIS — Z6828 Body mass index (BMI) 28.0-28.9, adult: Secondary | ICD-10-CM | POA: Diagnosis not present

## 2016-09-14 DIAGNOSIS — Z299 Encounter for prophylactic measures, unspecified: Secondary | ICD-10-CM | POA: Diagnosis not present

## 2016-09-14 DIAGNOSIS — E7800 Pure hypercholesterolemia, unspecified: Secondary | ICD-10-CM | POA: Diagnosis not present

## 2016-09-14 DIAGNOSIS — K1379 Other lesions of oral mucosa: Secondary | ICD-10-CM | POA: Diagnosis not present

## 2016-09-15 ENCOUNTER — Emergency Department (HOSPITAL_COMMUNITY)
Admission: EM | Admit: 2016-09-15 | Discharge: 2016-09-15 | Disposition: A | Discharge status: Home / Self Care | Payer: MEDICARE | Attending: Emergency Medicine | Admitting: Emergency Medicine

## 2016-09-15 ENCOUNTER — Emergency Department (HOSPITAL_COMMUNITY): Payer: MEDICARE

## 2016-09-15 ENCOUNTER — Encounter (HOSPITAL_COMMUNITY): Payer: Self-pay | Admitting: *Deleted

## 2016-09-15 ENCOUNTER — Telehealth: Payer: Self-pay | Admitting: Internal Medicine

## 2016-09-15 DIAGNOSIS — R1030 Lower abdominal pain, unspecified: Secondary | ICD-10-CM

## 2016-09-15 DIAGNOSIS — I2510 Atherosclerotic heart disease of native coronary artery without angina pectoris: Secondary | ICD-10-CM | POA: Insufficient documentation

## 2016-09-15 DIAGNOSIS — Z955 Presence of coronary angioplasty implant and graft: Secondary | ICD-10-CM | POA: Insufficient documentation

## 2016-09-15 DIAGNOSIS — R1031 Right lower quadrant pain: Secondary | ICD-10-CM | POA: Diagnosis not present

## 2016-09-15 DIAGNOSIS — K5732 Diverticulitis of large intestine without perforation or abscess without bleeding: Secondary | ICD-10-CM | POA: Diagnosis not present

## 2016-09-15 DIAGNOSIS — R05 Cough: Secondary | ICD-10-CM | POA: Diagnosis not present

## 2016-09-15 DIAGNOSIS — Z87891 Personal history of nicotine dependence: Secondary | ICD-10-CM | POA: Insufficient documentation

## 2016-09-15 DIAGNOSIS — E785 Hyperlipidemia, unspecified: Secondary | ICD-10-CM

## 2016-09-15 DIAGNOSIS — J449 Chronic obstructive pulmonary disease, unspecified: Secondary | ICD-10-CM | POA: Diagnosis not present

## 2016-09-15 LAB — COMPREHENSIVE METABOLIC PANEL
ALT: 22 U/L (ref 14–54)
ANION GAP: 8 (ref 5–15)
AST: 21 U/L (ref 15–41)
Albumin: 3.7 g/dL (ref 3.5–5.0)
Alkaline Phosphatase: 78 U/L (ref 38–126)
BUN: 13 mg/dL (ref 6–20)
CHLORIDE: 107 mmol/L (ref 101–111)
CO2: 24 mmol/L (ref 22–32)
Calcium: 8.8 mg/dL — ABNORMAL LOW (ref 8.9–10.3)
Creatinine, Ser: 0.81 mg/dL (ref 0.44–1.00)
Glucose, Bld: 96 mg/dL (ref 65–99)
POTASSIUM: 3.5 mmol/L (ref 3.5–5.1)
Sodium: 139 mmol/L (ref 135–145)
Total Bilirubin: 0.9 mg/dL (ref 0.3–1.2)
Total Protein: 7.5 g/dL (ref 6.5–8.1)

## 2016-09-15 LAB — CBC WITH DIFFERENTIAL/PLATELET
BASOS ABS: 0 10*3/uL (ref 0.0–0.1)
Basophils Relative: 0 %
Eosinophils Absolute: 0.2 10*3/uL (ref 0.0–0.7)
Eosinophils Relative: 2 %
HCT: 44.3 % (ref 36.0–46.0)
HEMOGLOBIN: 14.6 g/dL (ref 12.0–15.0)
LYMPHS ABS: 1.4 10*3/uL (ref 0.7–4.0)
LYMPHS PCT: 15 %
MCH: 31.2 pg (ref 26.0–34.0)
MCHC: 33 g/dL (ref 30.0–36.0)
MCV: 94.7 fL (ref 78.0–100.0)
Monocytes Absolute: 0.7 10*3/uL (ref 0.1–1.0)
Monocytes Relative: 8 %
NEUTROS PCT: 75 %
Neutro Abs: 6.8 10*3/uL (ref 1.7–7.7)
Platelets: 231 10*3/uL (ref 150–400)
RBC: 4.68 MIL/uL (ref 3.87–5.11)
RDW: 13.8 % (ref 11.5–15.5)
WBC: 9.1 10*3/uL (ref 4.0–10.5)

## 2016-09-15 LAB — LIPASE, BLOOD: LIPASE: 24 U/L (ref 11–51)

## 2016-09-15 LAB — URINALYSIS, ROUTINE W REFLEX MICROSCOPIC
Bilirubin Urine: NEGATIVE
Glucose, UA: NEGATIVE mg/dL
Hgb urine dipstick: NEGATIVE
Ketones, ur: NEGATIVE mg/dL
LEUKOCYTES UA: NEGATIVE
NITRITE: NEGATIVE
PROTEIN: NEGATIVE mg/dL
SPECIFIC GRAVITY, URINE: 1.02 (ref 1.005–1.030)
pH: 6 (ref 5.0–8.0)

## 2016-09-15 MED ORDER — SODIUM CHLORIDE 0.9 % IV BOLUS (SEPSIS)
500.0000 mL | Freq: Once | INTRAVENOUS | Status: AC
  Administered 2016-09-15: 500 mL via INTRAVENOUS

## 2016-09-15 MED ORDER — IOPAMIDOL (ISOVUE-300) INJECTION 61%
100.0000 mL | Freq: Once | INTRAVENOUS | Status: AC | PRN
  Administered 2016-09-15: 100 mL via INTRAVENOUS

## 2016-09-15 MED ORDER — KETOROLAC TROMETHAMINE 30 MG/ML IJ SOLN
30.0000 mg | Freq: Once | INTRAMUSCULAR | Status: AC
  Administered 2016-09-15: 30 mg via INTRAVENOUS
  Filled 2016-09-15: qty 1

## 2016-09-15 MED ORDER — METRONIDAZOLE 500 MG PO TABS
500.0000 mg | ORAL_TABLET | Freq: Once | ORAL | Status: AC
  Administered 2016-09-15: 500 mg via ORAL
  Filled 2016-09-15: qty 1

## 2016-09-15 MED ORDER — CIPROFLOXACIN HCL 500 MG PO TABS: 500.0000 mg | ORAL_TABLET | Freq: Two times a day (BID) | ORAL | 0 refills | Status: AC

## 2016-09-15 MED ORDER — MORPHINE SULFATE (PF) 2 MG/ML IV SOLN
2.0000 mg | INTRAVENOUS | Status: DC | PRN
  Filled 2016-09-15: qty 1

## 2016-09-15 MED ORDER — METRONIDAZOLE 500 MG PO TABS: 500.0000 mg | ORAL_TABLET | Freq: Two times a day (BID) | ORAL | 0 refills | Status: DC

## 2016-09-15 MED ORDER — CIPROFLOXACIN HCL 250 MG PO TABS
500.0000 mg | ORAL_TABLET | Freq: Once | ORAL | Status: AC
  Administered 2016-09-15: 500 mg via ORAL
  Filled 2016-09-15: qty 2

## 2016-09-15 NOTE — Telephone Encounter (Signed)
New message      Pt want to start seeing Dr Tenny Crawoss in [place] because it is closer.  I rescheduled [place] appt [place] to [place] but she is due to have labs drawn 10-25-16.  Please transfer the order to  hosp and she will go have labs drawn there on 10-25-16.

## 2016-09-15 NOTE — ED Triage Notes (Signed)
Right lower quadrant pain 

## 2016-09-15 NOTE — ED Provider Notes (Signed)
Emergency Department Provider Note   I have reviewed the triage vital signs and the nursing notes.   HISTORY  Chief Complaint Abdominal Pain   HPI Lisa [name] is a 71 y.o. female with PMH of COPD, GERD, CAD s/p DES placement during recent hospitalization presents to the emergency department for evaluation of intermittent right lower quadrant abdominal pain has worsened significantly over the past several days. Patient reports 3 total weeks of intermittent pain. No radiation of pain. No exacerbating or alleviating factors. Patient had diverticulitis on the left side that had a similar presentation. She reports some subjective fever today but notes some associated cough and runny nose. She's had some intermittent dyspnea since placement of DES during recent hospitalization and was told that some of her medications may cause this. She denies any nausea, vomiting, diarrhea. No blood in the stool. No dysuria, hesitancy, or urgency.   Past Medical History:  Diagnosis Date  . COPD (chronic obstructive pulmonary disease) (HCC)   . Coronary artery disease   . Cough    " ALL MY LIFE "  . Dyspnea   . GERD (gastroesophageal reflux disease)   . NSTEMI (non-ST elevated myocardial infarction) (HCC) 09/01/2016   DES to Cx/OM bifurcation  . Unstable angina (HCC) 08/2016    Patient Active Problem List   Diagnosis Date Noted  . Non-ST elevation (NSTEMI) myocardial infarction (HCC) 09/06/2016  . Coronary artery disease involving native coronary artery of native heart with unstable angina pectoris (HCC)   . Hyperlipidemia LDL goal <70   . Hypokalemia   . Unstable angina (HCC) 08/31/2016  . GERD (gastroesophageal reflux disease) 08/31/2016  . COPD (chronic obstructive pulmonary disease) (HCC) 08/31/2016  . Tobacco abuse 08/31/2016  . Chronic cough 08/31/2016    Past Surgical History:  Procedure Laterality Date  . ABDOMINAL HYSTERECTOMY    . CORONARY STENT INTERVENTION  09/05/2016   Successful complex PCI of the circumflex/OM bifurcation using a Synergy DES  . CORONARY STENT INTERVENTION N/A 09/05/2016   Procedure: Coronary Stent Intervention;  Surgeon: Tonny Bollman, MD;  Location: [facility] INVASIVE CV LAB;  Service: Cardiovascular;  Laterality: N/A;  . LEFT HEART CATH AND CORONARY ANGIOGRAPHY N/A 09/02/2016   Procedure: Left Heart Cath and Coronary Angiography;  Surgeon: Corky Crafts, MD;  Location: [facility] INVASIVE CV LAB;  Service: Cardiovascular;  Laterality: N/A;    Current Outpatient Rx  . Order #: [id number] Class: Historical Med  . Order #: [id number] Class: Normal  . Order #: [id number] Class: Normal  . Order #: [id number] Class: Historical Med  . Order #: [id number] Class: Historical Med  . Order #: [id number] Class: Historical Med  . Order #: [id number] Class: Historical Med  . Order #: [id number] Class: Normal  . Order #: [id number] Class: Historical Med  . Order #: [id number] Class: Historical Med  . Order #: [id number] Class: Normal  . Order #: [id number] Class: Historical Med  . Order #: [id number] Class: Historical Med  . Order #: [id number] Class: Historical Med  . Order #: [id number] Class: Print  . Order #: [id number] Class: Print  . Order #: [id number] Class: Print    Allergies Penicillins and Sulfa antibiotics  Family History  Problem Relation Age of Onset  . Asthma Mother   . Heart attack Father   . Cancer Other     Social History Social History  Substance Use Topics  . Smoking status: Former Smoker    Packs/day: 0.50    Years: 50.00    Types: Cigarettes    Quit date: 08/31/2016  . Smokeless tobacco: Never Used  .  Alcohol use Yes     Comment: occas    Review of Systems  Constitutional: No chills. Positive subjective fever.  Eyes: No visual changes. ENT: No sore throat. Cardiovascular: Denies chest pain. Respiratory: Denies shortness of breath. Gastrointestinal: Positive RLQ abdominal pain.  No nausea, no vomiting.  No diarrhea.  No  constipation. Genitourinary: Negative for dysuria. Musculoskeletal: Negative for back pain. Skin: Negative for rash. Neurological: Negative for headaches, focal weakness or numbness.  10-point ROS otherwise negative.  ____________________________________________   PHYSICAL EXAM:  VITAL SIGNS: ED Triage Vitals  Enc Vitals Group     BP 09/15/16 1653 (!) 151/77     Pulse Rate 09/15/16 1653 81     Resp 09/15/16 1653 18     Temp 09/15/16 1653 98.3 F (36.8 C)     Temp Source 09/15/16 1653 Oral     SpO2 09/15/16 1653 94 %     Pain Score 09/15/16 1652 7   Constitutional: Alert and oriented. Well appearing and in no acute distress. Eyes: Conjunctivae are normal.  Head: Atraumatic. Nose: No congestion/rhinnorhea. Mouth/Throat: Mucous membranes are moist. Neck: No stridor.   Cardiovascular: Normal rate, regular rhythm. Good peripheral circulation. Grossly normal heart sounds.   Respiratory: Normal respiratory effort.  No retractions. Lungs CTAB. Gastrointestinal: Patient with focal RLQ abdominal pain with some guarding. No rebound tenderness. No distention.  Musculoskeletal: No lower extremity tenderness nor edema. No gross deformities of extremities. Neurologic:  Normal speech and language. No gross focal neurologic deficits are appreciated.  Skin:  Skin is warm, dry and intact. No rash noted.  ____________________________________________   LABS (all labs ordered are listed, but only abnormal results are displayed)  Labs Reviewed  COMPREHENSIVE METABOLIC PANEL - Abnormal; Notable for the following:       Result Value   Calcium 8.8 (*)    All other components within normal limits  LIPASE, BLOOD  URINALYSIS, ROUTINE W REFLEX MICROSCOPIC  CBC WITH DIFFERENTIAL/PLATELET   ____________________________________________  RADIOLOGY  Dg Chest 2 View  Result Date: 09/15/2016 CLINICAL DATA:  Patient states cough and congestion since today. She was discharged from the hospital  recently after 7 days post cardiac stent placement. Hx of COPD, coronary artery disease, NSTEMI, unstable angina, left heart cath, and former smoker. EXAM: CHEST  2 VIEW COMPARISON:  08/31/2016 FINDINGS: The cardiac silhouette is normal in size and configuration. No mediastinal or hilar masses. No evidence of adenopathy. Lungs are clear.  No pleural effusion or pneumothorax. Skeletal structures are demineralized but intact. IMPRESSION: No active cardiopulmonary disease. Electronically Signed   By: Amie Portland M.D.   On: 09/15/2016 19:30   Ct Abdomen Pelvis W Contrast  Result Date: 09/15/2016 CLINICAL DATA:  Right lower quadrant pain. History of diverticulitis and hysterectomy. EXAM: CT ABDOMEN AND PELVIS WITH CONTRAST TECHNIQUE: Multidetector CT imaging of the abdomen and pelvis was performed using the standard protocol following bolus administration of intravenous contrast. CONTRAST:  ISOVUE-300 IOPAMIDOL (ISOVUE-300) INJECTION 61% COMPARISON:  09/26/2009 CT FINDINGS: Lower chest: The included heart is normal in size without pericardial effusion. Dependent bibasilar atelectasis. 4 mm stable right middle lobe nodule. Hepatobiliary: Mild hepatic steatosis. Normal appearing gallbladder. No biliary dilatation. Pancreas: Normal Spleen: Normal Adrenals/Urinary Tract: Normal bilateral adrenal glands. 3 mm interpolar left renal calculus without obstruction. Simple appearing left interpolar renal cyst measuring 16 x 12 x 10 mm. No hydroureteronephrosis. Bladder is unremarkable. Stomach/Bowel: Acute sigmoid diverticulitis without bowel obstruction, free air or abscess. Pericolonic inflammation is noted along the  mid sigmoid. Circular muscle hypertrophy and diverticulosis is otherwise noted of the distal descending through sigmoid colon. Stomach, small intestine and appendix are nonacute. Vascular/Lymphatic: Aortoiliac atherosclerosis without aneurysm. Retroaortic left renal vein. Reproductive: Status post  hysterectomy. No adnexal masses. Other: No abdominal wall hernia or abnormality. No abdominopelvic ascites. Musculoskeletal: Degenerative disc disease from L1 through L5 consistent with lumbar spondylosis. No acute nor suspicious osseous lesions. IMPRESSION: 1. Acute uncomplicated sigmoid diverticulitis. 2. Nonobstructing 3 mm left interpolar renal calculus adjacent to a simple 16 x 12 x 10 mm simple renal cyst. 3. Aortoiliac atherosclerosis. 4. Stable 4 mm right middle lobe noncalcified pulmonary nodule dating back to 2011 consistent with a benign finding. Electronically Signed   By: Tollie Eth M.D.   On: 09/15/2016 20:39    ____________________________________________   PROCEDURES  Procedure(s) performed:   Procedures  None ____________________________________________   INITIAL IMPRESSION / ASSESSMENT AND PLAN / ED COURSE  Pertinent labs & imaging results that were available during my care of the patient were reviewed by me and considered in my medical decision making (see chart for details).  Patient resents to the emergency department for evaluation of intermittent right lower quadrant pain that has become constant and more severe. No associated bloody diarrhea, nausea, vomiting. Patient does have focal right lower quadrant tenderness with no rebound. She does have some guarding in the area. Plan for labs, CT of the abdomen and pelvis. Patient also describing some subjective fevers and mild dyspnea with cough and runny nose. We'll obtain chest x-ray for further evaluation and to rule out pneumonia.  Patient with uncomplicated sigmoid diverticulitis. No right lower quadrant pathology on CT. Suspect that the diverticulitis is pain referred to the right lower quadrant. Patient is overall well-appearing. Plan for treatment of diverticulitis with Cipro and Flagyl with primary care physician follow-up. Discussed importance of follow-up colonoscopy and to discuss this with the primary care  physician especially considering the patient has never had a colonoscopy.  At this time, I do not feel there is any life-threatening condition present. I have reviewed and discussed all results (EKG, imaging, lab, urine as appropriate), exam findings with patient. I have reviewed nursing notes and appropriate previous records.  I feel the patient is safe to be discharged home without further emergent workup. Discussed usual and customary return precautions. Patient and family (if present) verbalize understanding and are comfortable with this plan.  Patient will follow-up with their primary care provider. If they do not have a primary care provider, information for follow-up has been provided to them. All questions have been answered.  ____________________________________________  FINAL CLINICAL IMPRESSION(S) / ED DIAGNOSES  Final diagnoses:  Sigmoid diverticulitis  Lower abdominal pain     MEDICATIONS GIVEN DURING THIS VISIT:  Medications  sodium chloride 0.9 % bolus 500 mL (0 mLs Intravenous Stopped 09/15/16 2116)  iopamidol (ISOVUE-300) 61 % injection 100 mL (100 mLs Intravenous Contrast Given 09/15/16 2019)  ketorolac (TORADOL) 30 MG/ML injection 30 mg (30 mg Intravenous Given 09/15/16 1927)  metroNIDAZOLE (FLAGYL) tablet 500 mg (500 mg Oral Given 09/15/16 2119)  ciprofloxacin (CIPRO) tablet 500 mg (500 mg Oral Given 09/15/16 2120)     NEW OUTPATIENT MEDICATIONS STARTED DURING THIS VISIT:  Discharge Medication List as of 09/15/2016  9:06 PM    START taking these medications   Details  ciprofloxacin (CIPRO) 500 MG tablet Take 1 tablet (500 mg total) by mouth 2 (two) times daily., Starting Thu 09/15/2016, Until Thu 09/22/2016, Print    metroNIDAZOLE (  FLAGYL) 500 MG tablet Take 1 tablet (500 mg total) by mouth 2 (two) times daily., Starting Thu 09/15/2016, Print         Note:  This document was prepared using Dragon voice recognition software and may include unintentional dictation  errors.  Alona BeneJoshua Long, MD Emergency Medicine    Long, Arlyss RepressJoshua G, MD 09/15/16 2147

## 2016-09-15 NOTE — Discharge Instructions (Signed)
We believe your symptoms are caused by diverticulitis.  Most of the time this condition (please read through the included information) can be cured with outpatient antibiotics.  Please take the full course of prescribed medication(s) and follow up with the doctors recommended above. ° °Return to the ED if your abdominal pain worsens or fails to improve, you develop bloody vomiting, bloody diarrhea, you are unable to tolerate fluids due to vomiting, fever greater than 101, or other symptoms that concern you. °  °Diverticulitis °Diverticulitis is inflammation or infection of small pouches in your colon that form when you have a condition called diverticulosis. The pouches in your colon are called diverticula. Your colon, or large intestine, is where water is absorbed and stool is formed. °Complications of diverticulitis can include: °Bleeding. °Severe infection. °Severe pain. °Perforation of your colon. °Obstruction of your colon. °CAUSES  °Diverticulitis is caused by bacteria. °Diverticulitis happens when stool becomes trapped in diverticula. This allows bacteria to grow in the diverticula, which can lead to inflammation and infection. °RISK FACTORS °People with diverticulosis are at risk for diverticulitis. Eating a diet that does not include enough fiber from fruits and vegetables may make diverticulitis more likely to develop. °SYMPTOMS  °Symptoms of diverticulitis may include: °Abdominal pain and tenderness. The pain is normally located on the left side of the abdomen, but may occur in other areas. °Fever and chills. °Bloating. °Cramping. °Nausea. °Vomiting. °Constipation. °Diarrhea. °Blood in your stool. °DIAGNOSIS  °Your health care provider will ask you about your medical history and do a physical exam. You may need to have tests done because many medical conditions can cause the same symptoms as diverticulitis. Tests may include: °Blood tests. °Urine tests. °Imaging tests of the abdomen, including X-rays and  CT scans. °When your condition is under control, your health care provider may recommend that you have a colonoscopy. A colonoscopy can show how severe your diverticula are and whether something else is causing your symptoms. °TREATMENT  °Most cases of diverticulitis are mild and can be treated at home. Treatment may include: °Taking over-the-counter pain medicines. °Following a clear liquid diet. °Taking antibiotic medicines by mouth for 7-10 days. °More severe cases may be treated at a hospital. Treatment may include: °Not eating or drinking. °Taking prescription pain medicine. °Receiving antibiotic medicines through an IV tube. °Receiving fluids and nutrition through an IV tube. °Surgery. °HOME CARE INSTRUCTIONS  °Follow your health care provider's instructions carefully. °Follow a full liquid diet or other diet as directed by your health care provider. After your symptoms improve, your health care provider may tell you to change your diet. He or she may recommend you eat a high-fiber diet. Fruits and vegetables are good sources of fiber. Fiber makes it easier to pass stool. °Take fiber supplements or probiotics as directed by your health care provider. °Only take medicines as directed by your health care provider. °Keep all your follow-up appointments. °SEEK MEDICAL CARE IF:  °Your pain does not improve. °You have a hard time eating food. °Your bowel movements do not return to normal. °SEEK IMMEDIATE MEDICAL CARE IF:  °Your pain becomes worse. °Your symptoms do not get better. °Your symptoms suddenly get worse. °You have a fever. °You have repeated vomiting. °You have bloody or black, tarry stools. °MAKE SURE YOU:  °Understand these instructions. °Will watch your condition. °Will get help right away if you are not doing well or get worse. °Document Released: 11/24/2004 Document Revised: 02/19/2013 Document Reviewed: 01/09/2013 °[employer]® Patient Information ©2015 [employer].   This information is not intended  to replace advice given to you by your health care provider. Make sure you discuss any questions you have with your health care provider. ° ° °

## 2016-09-15 NOTE — ED Notes (Signed)
Pt states she recently had stent placement and insertion site was to R groin. Denies new bruising or bleeding from site. Hx of diverticulitis. Has had this pain intermittently for 3-4 weeks. Normal BM at 1100 today. Denies blood in stool or any V/.

## 2016-09-15 NOTE — Telephone Encounter (Signed)
Spoke with pt and she will go to [facility] to have labs drawn in August.  Advised pt I will send requisition sheets to her in the mail to take with her to have labs drawn.  Pt appreciative for call.

## 2016-09-20 DIAGNOSIS — K5732 Diverticulitis of large intestine without perforation or abscess without bleeding: Secondary | ICD-10-CM | POA: Diagnosis not present

## 2016-09-20 DIAGNOSIS — R1030 Lower abdominal pain, unspecified: Secondary | ICD-10-CM | POA: Diagnosis not present

## 2016-09-30 ENCOUNTER — Encounter (HOSPITAL_COMMUNITY): Payer: Self-pay

## 2016-09-30 ENCOUNTER — Observation Stay (HOSPITAL_COMMUNITY)
Admission: EM | Admit: 2016-09-30 | Discharge: 2016-10-01 | Disposition: A | Discharge status: Home / Self Care | Payer: MEDICARE | Attending: Family Medicine | Admitting: Family Medicine

## 2016-09-30 ENCOUNTER — Emergency Department (HOSPITAL_COMMUNITY): Payer: MEDICARE

## 2016-09-30 DIAGNOSIS — I214 Non-ST elevation (NSTEMI) myocardial infarction: Secondary | ICD-10-CM | POA: Diagnosis not present

## 2016-09-30 DIAGNOSIS — I25119 Atherosclerotic heart disease of native coronary artery with unspecified angina pectoris: Secondary | ICD-10-CM | POA: Diagnosis not present

## 2016-09-30 DIAGNOSIS — I2510 Atherosclerotic heart disease of native coronary artery without angina pectoris: Secondary | ICD-10-CM | POA: Diagnosis not present

## 2016-09-30 DIAGNOSIS — R079 Chest pain, unspecified: Principal | ICD-10-CM | POA: Diagnosis present

## 2016-09-30 DIAGNOSIS — Z87891 Personal history of nicotine dependence: Secondary | ICD-10-CM | POA: Diagnosis not present

## 2016-09-30 DIAGNOSIS — Z79899 Other long term (current) drug therapy: Secondary | ICD-10-CM | POA: Diagnosis not present

## 2016-09-30 DIAGNOSIS — J449 Chronic obstructive pulmonary disease, unspecified: Secondary | ICD-10-CM | POA: Diagnosis not present

## 2016-09-30 DIAGNOSIS — Z7982 Long term (current) use of aspirin: Secondary | ICD-10-CM | POA: Diagnosis not present

## 2016-09-30 LAB — BASIC METABOLIC PANEL
Anion gap: 10 (ref 5–15)
BUN: 13 mg/dL (ref 6–20)
CALCIUM: 9.6 mg/dL (ref 8.9–10.3)
CO2: 26 mmol/L (ref 22–32)
Chloride: 103 mmol/L (ref 101–111)
Creatinine, Ser: 0.72 mg/dL (ref 0.44–1.00)
GLUCOSE: 117 mg/dL — AB (ref 65–99)
POTASSIUM: 3.7 mmol/L (ref 3.5–5.1)
SODIUM: 139 mmol/L (ref 135–145)

## 2016-09-30 LAB — BRAIN NATRIURETIC PEPTIDE: B NATRIURETIC PEPTIDE 5: 72 pg/mL (ref 0.0–100.0)

## 2016-09-30 LAB — CBC
HEMATOCRIT: 47.3 % — AB (ref 36.0–46.0)
HEMOGLOBIN: 15.6 g/dL — AB (ref 12.0–15.0)
MCH: 31.3 pg (ref 26.0–34.0)
MCHC: 33 g/dL (ref 30.0–36.0)
MCV: 94.8 fL (ref 78.0–100.0)
Platelets: 274 10*3/uL (ref 150–400)
RBC: 4.99 MIL/uL (ref 3.87–5.11)
RDW: 13.9 % (ref 11.5–15.5)
WBC: 10.2 10*3/uL (ref 4.0–10.5)

## 2016-09-30 LAB — I-STAT TROPONIN, ED: TROPONIN I, POC: 0.01 ng/mL (ref 0.00–0.08)

## 2016-09-30 LAB — TROPONIN I

## 2016-09-30 LAB — MAGNESIUM: Magnesium: 1.9 mg/dL (ref 1.7–2.4)

## 2016-09-30 MED ORDER — TICAGRELOR 90 MG PO TABS
90.0000 mg | ORAL_TABLET | Freq: Two times a day (BID) | ORAL | Status: DC
  Administered 2016-09-30 – 2016-10-01 (×2): 90 mg via ORAL
  Filled 2016-09-30 (×2): qty 1

## 2016-09-30 MED ORDER — ATORVASTATIN CALCIUM 40 MG PO TABS
80.0000 mg | ORAL_TABLET | Freq: Every day | ORAL | Status: DC
  Filled 2016-09-30: qty 2

## 2016-09-30 MED ORDER — MOMETASONE FURO-FORMOTEROL FUM 200-5 MCG/ACT IN AERO
2.0000 | INHALATION_SPRAY | Freq: Two times a day (BID) | RESPIRATORY_TRACT | Status: DC
  Administered 2016-09-30 – 2016-10-01 (×2): 2 via RESPIRATORY_TRACT
  Filled 2016-09-30: qty 8.8

## 2016-09-30 MED ORDER — FENTANYL CITRATE (PF) 100 MCG/2ML IJ SOLN
50.0000 ug | Freq: Once | INTRAMUSCULAR | Status: AC
  Administered 2016-09-30: 50 ug via INTRAVENOUS
  Filled 2016-09-30: qty 2

## 2016-09-30 MED ORDER — MORPHINE SULFATE (PF) 2 MG/ML IV SOLN: 2.0000 mg | INTRAVENOUS | Status: DC | PRN

## 2016-09-30 MED ORDER — GI COCKTAIL ~~LOC~~: 30.0000 mL | Freq: Four times a day (QID) | ORAL | Status: DC | PRN

## 2016-09-30 MED ORDER — ACETAMINOPHEN 325 MG PO TABS: 650.0000 mg | ORAL_TABLET | Freq: Four times a day (QID) | ORAL | Status: DC | PRN

## 2016-09-30 MED ORDER — NITROGLYCERIN 0.4 MG SL SUBL
0.4000 mg | SUBLINGUAL_TABLET | SUBLINGUAL | Status: DC | PRN
  Administered 2016-09-30: 0.4 mg via SUBLINGUAL
  Filled 2016-09-30: qty 1

## 2016-09-30 MED ORDER — ASPIRIN 81 MG PO CHEW
81.0000 mg | CHEWABLE_TABLET | Freq: Every day | ORAL | Status: DC
  Administered 2016-10-01: 81 mg via ORAL
  Filled 2016-09-30: qty 1

## 2016-09-30 MED ORDER — ASPIRIN 81 MG PO CHEW
324.0000 mg | CHEWABLE_TABLET | Freq: Once | ORAL | Status: AC
  Administered 2016-09-30: 324 mg via ORAL
  Filled 2016-09-30: qty 4

## 2016-09-30 MED ORDER — PANTOPRAZOLE SODIUM 40 MG PO TBEC
40.0000 mg | DELAYED_RELEASE_TABLET | Freq: Every day | ORAL | Status: DC
  Administered 2016-10-01: 40 mg via ORAL
  Filled 2016-09-30: qty 1

## 2016-09-30 NOTE — ED Notes (Signed)
a 

## 2016-09-30 NOTE — ED Notes (Signed)
Patient is resting comfortably. 

## 2016-09-30 NOTE — ED Notes (Signed)
Alerted lab of BNP

## 2016-09-30 NOTE — ED Triage Notes (Signed)
Pt is complaining of centralized chest pain that started last night. Rates pain 3/10. Took 2 Nitroglycerin before coming without relief. Pt had stent placed on September 05, 2016 due to NSTEMI

## 2016-09-30 NOTE — ED Provider Notes (Signed)
[facility]-EM[facility]Y DEPT Provider Note   CSN: [id number] Arrival date & time: 09/30/16  1050     History   Chief Complaint Chief Complaint  Patient presents with  . Chest Pain    HPI Lisa SimsCarolyn [name] is a 71 y.o. female.   Chest Pain   This is a new problem. The current episode started yesterday. The problem occurs hourly. The problem has been gradually worsening. The pain is associated with exertion and walking. The pain is present in the substernal region. The pain is at a severity of 3/10. The pain is moderate. The quality of the pain is described as pressure-like and brief. The pain does not radiate. Associated symptoms include cough, exertional chest pressure and shortness of breath (seems to be improving over last month). Pertinent negatives include no diaphoresis and no dizziness. She has tried nitroglycerin for the symptoms. The treatment provided moderate relief. Risk factors include being elderly, smoking/tobacco exposure and lack of exercise.  Her past medical history is significant for CAD.    Past Medical History:  Diagnosis Date  . COPD (chronic obstructive pulmonary disease) (HCC)   . Coronary artery disease   . Cough    " ALL MY LIFE "  . Dyspnea   . GERD (gastroesophageal reflux disease)   . NSTEMI (non-ST elevated myocardial infarction) (HCC) 09/01/2016   DES to Cx/OM bifurcation  . Unstable angina (HCC) 08/2016    Patient Active Problem List   Diagnosis Date Noted  . Non-ST elevation (NSTEMI) myocardial infarction (HCC) 09/06/2016  . Coronary artery disease involving native coronary artery of native heart with unstable angina pectoris (HCC)   . Hyperlipidemia LDL goal <70   . Hypokalemia   . Unstable angina (HCC) 08/31/2016  . GERD (gastroesophageal reflux disease) 08/31/2016  . COPD (chronic obstructive pulmonary disease) (HCC) 08/31/2016  . Tobacco abuse 08/31/2016  . Chronic cough 08/31/2016    Past Surgical History:  Procedure Laterality Date  .  ABDOMINAL HYSTERECTOMY    . CORONARY STENT INTERVENTION  09/05/2016   Successful complex PCI of the circumflex/OM bifurcation using a Synergy DES  . CORONARY STENT INTERVENTION N/A 09/05/2016   Procedure: Coronary Stent Intervention;  Surgeon: Tonny Bollmanooper, Michael, MD;  [facility];  Service: Cardiovascular;  Laterality: N/A;  . LEFT HEART CATH AND CORONARY ANGIOGRAPHY N/A 09/02/2016   Procedure: Left Heart Cath and Coronary Angiography;  Surgeon: Corky CraftsVaranasi, Jayadeep S, MD;  [facility];  Service: Cardiovascular;  Laterality: N/A;    OB History    Gravida Para Term Preterm AB Living   7 6 6   1 4    SAB TAB Ectopic Multiple Live Births   1               Home Medications    Prior to Admission medications   Medication Sig Start Date End Date Taking? Authorizing Provider  acetaminophen (TYLENOL) 500 MG tablet Take 1,000 mg by mouth every 6 (six) hours as needed for moderate pain.   Yes [provider]  ADVAIR DISKUS 250-50 MCG/DOSE AEPB Inhale 1 puff into the lungs at bedtime as needed (for shortness of breath).  08/11/16  Yes [provider]  aspirin EC 81 MG EC tablet Take 1 tablet (81 mg total) by mouth daily. 09/07/16  Yes Duke, Roe RutherfordAngela Nicole, PA  atorvastatin (LIPITOR) 80 MG tablet Take 1 tablet (80 mg total) by mouth daily at 6 PM. 09/06/16  Yes Duke, Roe RutherfordAngela Nicole, PA  Chlorpheniramine Maleate (ALLERGY PO)  Take 1 tablet by mouth daily. Equate brand   Yes [provider]  Cholecalciferol (VITAMIN D3) 2000 units TABS Take 2,000 Units by mouth daily.   Yes [provider]  Cinnamon 500 MG capsule Take 1,000 mg by mouth daily.   Yes [provider]  esomeprazole (NEXIUM) 40 MG capsule Take 40 mg by mouth daily. 08/17/16  Yes [provider]  Glucosamine-Chondroitin (MOVE FREE PO) Take 1 tablet by mouth daily.   Yes [provider]  nitroGLYCERIN (NITROSTAT) 0.4 MG SL tablet Place 1 tablet (0.4 mg total)  under the tongue every 5 (five) minutes as needed for chest pain. 09/06/16  Yes Duke, Roe Rutherford, PA  Omega-3 Fatty Acids (FISH OIL OMEGA-3 PO) Take 1 capsule by mouth daily.   Yes [provider]  Probiotic Product (PROBIOTIC PO) Take 1 capsule by mouth daily.   Yes [provider]  ticagrelor (BRILINTA) 90 MG TABS tablet Take 1 tablet (90 mg total) by mouth 2 (two) times daily. 09/06/16  Yes Duke, Roe Rutherford, PA  VENTOLIN HFA 108 (90 Base) MCG/ACT inhaler Inhale 2 puffs into the lungs every 6 (six) hours as needed for wheezing or shortness of breath.  08/17/16  Yes [provider]  vitamin B-12 (CYANOCOBALAMIN) 500 MCG tablet Take 500 mcg by mouth daily.   Yes [provider]  vitamin C (ASCORBIC ACID) 500 MG tablet Take 500 mg by mouth daily.   Yes [provider]  ciprofloxacin (CIPRO) 500 MG tablet Take 500 mg by mouth 2 (two) times daily.    [provider]  metroNIDAZOLE (FLAGYL) 500 MG tablet Take 1 tablet (500 mg total) by mouth 2 (two) times daily. Patient not taking: Reported on 09/30/2016 09/15/16   Long, Arlyss Repress, MD    Family History Family History  Problem Relation Age of Onset  . Asthma Mother   . Heart attack Father   . Cancer Other     Social History Social History  Substance Use Topics  . Smoking status: Former Smoker    Packs/day: 0.50    Years: 50.00    Types: Cigarettes    Quit date: 08/31/2016  . Smokeless tobacco: Never Used  . Alcohol use Yes     Comment: occas     Allergies   Penicillins and Sulfa antibiotics   Review of Systems Review of Systems  Constitutional: Negative for diaphoresis.  Respiratory: Positive for cough and shortness of breath (seems to be improving over last month).   Cardiovascular: Positive for chest pain.  Neurological: Negative for dizziness.  All other systems reviewed and are negative.    Physical Exam Updated Vital Signs BP 111/68 (BP Location: Left Arm)   Pulse  84   Resp 18   Ht 5\' 6"  (1.676 m)   Wt 78 kg (172 lb)   LMP 09/01/2016   SpO2 98%   BMI 27.76 kg/m   Physical Exam  Constitutional: She is oriented to person, place, and time. She appears well-developed and well-nourished.  HENT:  Head: Normocephalic and atraumatic.  Eyes: Conjunctivae and EOM are normal.  Neck: Normal range of motion.  Cardiovascular: Normal rate and regular rhythm.   Pulmonary/Chest: No stridor. No respiratory distress.  Abdominal: Soft. Bowel sounds are normal. She exhibits no distension.  Neurological: She is alert and oriented to person, place, and time. No cranial nerve deficit. Coordination normal.  Skin: Skin is warm and dry.  Nursing note and vitals reviewed.    ED Treatments /  Results  Labs (all labs ordered are listed, but only abnormal results are displayed) Labs Reviewed  BASIC METABOLIC PANEL - Abnormal; Notable for the following:       Result Value   Glucose, Bld 117 (*)    All other components within normal limits  CBC - Abnormal; Notable for the following:    Hemoglobin 15.6 (*)    HCT 47.3 (*)    All other components within normal limits  BRAIN NATRIURETIC PEPTIDE  I-STAT TROPONIN, ED    EKG  EKG Interpretation  Date/Time:  Friday September 30 2016 11:01:44 EDT Ventricular Rate:  63 PR Interval:    QRS Duration: 96 QT Interval:  428 QTC Calculation: 439 R Axis:   8 Text Interpretation:  Sinus rhythm Low voltage, precordial leads ST depression in V5/6 appears new from july 10 Confirmed by Marily MemosMesner, Sherryll Skoczylas [phone]([id number]) on 09/30/2016 12:15:24 PM       Radiology Dg Chest 2 View  Result Date: 09/30/2016 CLINICAL DATA:  Chest pain since last night. EXAM: CHEST  2 VIEW COMPARISON:  09/15/2016 FINDINGS: Normal heart size. Linear and patchy densities at the left base compatible with atelectasis versus airspace disease. No pneumothorax or pleural effusion. IMPRESSION: Left basilar atelectasis versus airspace disease. Followup PA and lateral chest  X-ray is recommended in 3-4 weeks following trial of antibiotic therapy to ensure resolution and exclude underlying malignancy. Electronically Signed   By: Jolaine ClickArthur  Hoss M.D.   On: 09/30/2016 11:28    Procedures Procedures (including critical care time)  Medications Ordered in ED Medications  nitroGLYCERIN (NITROSTAT) SL tablet 0.4 mg (not administered)  fentaNYL (SUBLIMAZE) injection 50 mcg (not administered)     Initial Impression / Assessment and Plan / ED Course  I have reviewed the triage vital signs and the nursing notes.  Pertinent labs & imaging results that were available during my care of the patient were reviewed by me and considered in my medical decision making (see chart for details).  Recently admitted for NSTEMI and stent placement, on brillinta with good compliance. Pain similar to NSTEMI pain. Worse with exertion.  High risk for ACS. Will need hospital rule out, will d/w cardiology about same.   Clinical Course as of Oct 01 1315  Fri Sep 30, 2016  1248 Cxr w/ questionable opacity. Patient without fever, productive cough, leukocytosis. Will hold on antibiotics until I discuss with medicine regarding ACS rule out.   [JM]  1250 Not 0.  Troponin i, poc: 0.01 [JM]  1255 Discussed with Dr. Tenny Crawoss. Ok with admission here for ACS rule out.   [JM]  1316 Discussed with Dr. Irene LimboGoodrich with medicine for observation/tele for ACS rule out. Will continue to hold on antibiotics until further delineated. Stable for admission.  [JM]    Clinical Course User Index [JM] Jerita Wimbush, Barbara CowerJason, MD      Final Clinical Impressions(s) / ED Diagnoses   Final diagnoses:  Nonspecific chest pain     Leon Goodnow, Barbara CowerJason, MD 09/30/16 1318

## 2016-09-30 NOTE — Consult Note (Signed)
Cardiology Consultation:   Patient ID: Lisa [name]; [id number]; May 22, 1945   Admit date: 09/30/2016 Date of Consult: 09/30/2016  Primary Care Provider: Ignatius Specking, MD Primary Cardiologist: Tenny Craw  Patient Profile:   Lisa [name] is a 71 y.o. female with a hx of CAD with recent hospitalization at [facility] for NSTEMI, cath on 09/02/2016 with complex  PCI to the CX/OM bifurcation with DES,  who is being seen today for the evaluation of chest pain  at the request of Dr. Irene Limbo, Hospitalist.   History of Present Illness:   [name] presented to the ER with recurrent chest pain. Recent stent placement on 09/02/2016 in the setting of NSTEMI. She states she awakened last night with substernal chest pain described as pressure, went away on his own and she was apical back to sleep. She awoke this morning around 6:30 and was in her kitchen doing minimal activity, when she again began to feel substernal chest pressure nonradiating and not associated with diaphoresis dizziness or dyspnea. She states the pain was like the pain she had prior to stent placement. The patient stated prior to her stent placement she did not have any associated symptoms or radiation. She took two SL NTG tablets without resolution of symptoms.   She states she's been having worsening dyspnea usually associated with taking Brilinta but that has begun to improve over the last week. However she did not have worsening dyspnea with chest pressure. She did just take her Brilinta earlier before experiencing the discomfort this morning. Of note, the patient was not sent home on beta blocker after recent discharge due to nocturnal bradycardia.  On arrival to ER, BP 129/75, HR 63 bpm, O2 Sat 93%. She was afebrile. Review of pertinent labs demonstrated potassium of 3.7, glucose of 117. Hgb of 15.6 and Hct 47.3. EKG revealed NSR with no acute ST T wave abnormalities. Troponin negative X 1. She was treated with NTG and Fentanyl along with  ASA. Pain subsided.   Past Medical History:  Diagnosis Date  . COPD (chronic obstructive pulmonary disease) (HCC)   . Coronary artery disease   . Cough    " ALL MY LIFE "  . Dyspnea   . GERD (gastroesophageal reflux disease)   . NSTEMI (non-ST elevated myocardial infarction) (HCC) 09/01/2016   DES to Cx/OM bifurcation  . Unstable angina (HCC) 08/2016    Past Surgical History:  Procedure Laterality Date  . ABDOMINAL HYSTERECTOMY    . CORONARY STENT INTERVENTION  09/05/2016   Successful complex PCI of the circumflex/OM bifurcation using a Synergy DES  . CORONARY STENT INTERVENTION N/A 09/05/2016   Procedure: Coronary Stent Intervention;  Surgeon: Tonny Bollman, MD;  [facility] INVASIVE CV LAB;  Service: Cardiovascular;  Laterality: N/A;  . LEFT HEART CATH AND CORONARY ANGIOGRAPHY N/A 09/02/2016   Procedure: Left Heart Cath and Coronary Angiography;  Surgeon: Corky Crafts, MD;  Location: [facility] INVASIVE CV LAB;  Service: Cardiovascular;  Laterality: N/A;     Inpatient Medications: Scheduled Meds: . [START ON 10/01/2016] aspirin  81 mg Oral Daily  . atorvastatin  80 mg Oral q1800  . mometasone-formoterol  2 puff Inhalation BID  . [START ON 10/01/2016] pantoprazole  40 mg Oral Daily  . ticagrelor  90 mg Oral BID   Continuous Infusions:  PRN Meds: acetaminophen, gi cocktail, morphine injection, nitroGLYCERIN  Allergies:    Allergies  Allergen Reactions  . Penicillins Anaphylaxis    Has patient had a PCN reaction causing immediate rash, facial/tongue/throat  swelling, SOB or lightheadedness with hypotension: Yes Has patient had a PCN reaction causing severe rash involving mucus membranes or skin necrosis: yes Has patient had a PCN reaction that required hospitalization: no Has patient had a PCN reaction occurring within the last 10 years: no If all of the above answers are "NO", then may proceed with Cephalosporin use.   . Sulfa Antibiotics Other (See Comments)    Hands and  feet blistered    Social History:   Social History   Social History  . Marital status: Divorced    Spouse name: N/A  . Number of children: N/A  . Years of education: N/A   Occupational History  . Not on file.   Social History Main Topics  . Smoking status: Former Smoker    Packs/day: 0.50    Years: 50.00    Types: Cigarettes    Quit date: 08/31/2016  . Smokeless tobacco: Never Used  . Alcohol use Yes     Comment: occas  . Drug use: No  . Sexual activity: Yes   Other Topics Concern  . Not on file   Social History Narrative  . No narrative on file    Family History:   Family History  Problem Relation Age of Onset  . Asthma Mother   . Heart attack Father [id number]  . Cancer Other   . Heart attack Son 48     ROS:  Please see the history of present illness.  ROS All other ROS reviewed and negative.     Physical Exam/Data:   Vitals:   09/30/16 1230 09/30/16 1300 09/30/16 1400 09/30/16 1503  BP: 122/80 122/71 (!) 141/94 (!) 149/98  Pulse: (!) 59 (!) 57 (!) 57 60  Resp: 16 20 (!) 23 18  Temp:    97.9 F (36.6 C)  TempSrc:    Oral  SpO2: 94% 92% 93% 99%  Weight:    172 lb 1.6 oz (78.1 kg)  Height:    5' 6.5" (1.689 m)   No intake or output data in the 24 hours ending 09/30/16 1652 Filed Weights   09/30/16 1105 09/30/16 1503  Weight: 172 lb (78 kg) 172 lb 1.6 oz (78.1 kg)   Body mass index is 27.36 kg/m.  General:  Well nourished, well developed, in no acute distress HEENT: normal Lymph: no adenopathy Neck: no JVD Endocrine:  No thryomegaly Vascular: No carotid bruits; FA pulses 2+ bilaterally without bruits  Cardiac:  normal S1, S2; RRR; no murmur  Lungs:  clear to auscultation bilaterally, no wheezing, rhonchi or rales  Abd: soft, nontender, no hepatomegaly  Ext: no edema Musculoskeletal:  No deformities, BUE and BLE strength normal and equal Skin: warm and dry  Neuro:  CNs 2-12 intact, no focal abnormalities noted Psych:  Normal affect   EKG:  The EKG  was personally reviewed and demonstrates: Normal sinus rhythm without acute ST-T wave changes. Telemetry:  Telemetry was personally reviewed and demonstrates:  normal sinus rhythm sinus bradycardia.  Relevant CV Studies:  Cardiac Cath 09/02/2016  Conclusion     Prox RCA to Mid RCA lesion, 25 %stenosed.  Ost 2nd Mrg to 2nd Mrg lesion, 90 %stenosed. Dist Cx lesion, 90 %stenosed. Complex bifurcation lesion.  The left ventricular systolic function is normal.  LV end diastolic pressure is normal.  The left ventricular ejection fraction is 55-65% by visual estimate.  There is no aortic valve stenosis.     PCI Data 09/05/2016  Successful complex PCI of the circumflex/OM  bifurcation using a Synergy DES  Recommend: ASA/Brilinta at least 12 months. Should be ok for discharge tomorrow if no complications arise.    Echocardiogram 09/02/2016  - Left ventricle: The cavity size was normal. Wall thickness was   increased in a pattern of mild LVH. Systolic function was normal.   The estimated ejection fraction was in the range of 60% to 65%.   Doppler parameters are consistent with abnormal left ventricular   relaxation (grade 1 diastolic dysfunction). - Mitral valve: There was mild regurgitation. - Pulmonary arteries: PA peak pressure: 37 mm Hg (S).  Laboratory Data:  Chemistry Recent Labs Lab 09/30/16 1100  NA 139  K 3.7  CL 103  CO2 26  GLUCOSE 117*  BUN 13  CREATININE 0.72  CALCIUM 9.6  GFRNONAA >60  GFRAA >60  ANIONGAP 10    Hematology Recent Labs Lab 09/30/16 1100  WBC 10.2  RBC 4.99  HGB 15.6*  HCT 47.3*  MCV 94.8  MCH 31.3  MCHC 33.0  RDW 13.9  PLT 274   Cardiac EnzymesNo results for input(s): TROPONINI in the last 168 hours.  Recent Labs Lab 09/30/16 1115  TROPIPOC 0.01    BNP Recent Labs Lab 09/30/16 1100  BNP 72.0    Radiology/Studies:  Dg Chest 2 View  Result Date: 09/30/2016 CLINICAL DATA:  Chest pain since last night. EXAM: CHEST  2 VIEW  COMPARISON:  09/15/2016 FINDINGS: Normal heart size. Linear and patchy densities at the left base compatible with atelectasis versus airspace disease. No pneumothorax or pleural effusion. IMPRESSION: Left basilar atelectasis versus airspace disease. Followup PA and lateral chest X-ray is recommended in 3-4 weeks following trial of antibiotic therapy to ensure resolution and exclude underlying malignancy. Electronically Signed   By: Jolaine ClickArthur  Hoss M.D.   On: 09/30/2016 11:28    Assessment and Plan:   1.Chest Pain: Recent complex stenting of the bifurcation of the Cx and OM approximately one month ago.She's had 2 episodes, one which awakened her in middle night, and one which occurred first thing this morning when she was in her kitchen doing minimal exertional activities.  She  states symptoms are just like what she experienced prior to her recent stenting. She  did not have associated diaphoresis dyspnea or radiation prior to having stent placement. She describes it as heaviness substernal. After treatment in the emergency room with fentanyl nitroglycerin and aspirin she has not had any further complaints.   With recent stent placement she does not appear to have stent closure  Unusual to have instent restenosis at this point  It was a difficlt intervention to distal LCx and branch  Always a risk for problmes    Currently comfortable  No CP  EKG never had changes  I would recomm following  Continue home meds  Follow troponin    If continued pain or signif bump in trop would consider transfer for relook catheterization    2. CAD; As above  .I did tell patient to watch salt intake  Fluid retention could exacerbate some of chest symptoms though overall fluid appears to be OK  Pt admits to eating extra salt    3. Hx of Pneumonia: No treatment at this time.   4. GERD: On PPI. Will check magnesium level.   5  HL  COntinue statin  6  Tob abuse  Congratulated on cessation    Signed, Joni ReiningKathryn  Lawrence, NP  09/30/2016 4:52 PM   Pt seen and examined   I have  amended not above by Harriet Pho to reflect my findings   ON exam:  Pt comfortable lying in bed   Neck  JVP normal  Lungs mild rhonchi  Cardiac   RRR  No S3  No murmurs  Ext withoug edema EKG without acute changes    Continue to follow on home meds  Follow enzymes  If OK and enzymes neg Ambulate in AM and consider D/C home   If positive or if continued symptoms would consider relook caht   Follow BP    Dietrich Pates

## 2016-09-30 NOTE — H&P (Signed)
History and Physical  Lisa SimsCarolyn [name] WUJ:[id number] DOB: 12-16-1945 DOA: 09/30/2016  PCP: Ignatius SpeckingVyas, Dhruv B, MD  Patient coming from: home  Chief Complaint: chest pain  HPI:  71 year old woman PMH NSTEMI 90% occlusion of the circumflex/OM bifurcation requiring complex PCI and stent placement 08/2016 who presented to the emergency department with chest pain. Pain was relieved in the emergency department, initial troponin was negative and EKG nonacute. She was referred for observation after telephone discussion with Dr. Tenny Crawoss.  Patient felt well yesterday with no complaints. Some time last night she awoke with central chest pain, this resolved and she was able to go back to sleep. This morning she got up and went about her usual daily activities when she developed severe 8/10 chest pain, located centrally, similar to previous NSTEMI, associated with shortness of breath, partially relieved with nitroglycerin. Fully relieved in the emergency department. Briefly 2-3 hours.  ED Course: Afebrile, vital signs stable, treated with aspirin, nitroglycerin and fentanyl   Review of Systems:  Negative for fever, new visual changes, sore throat, rash, new muscle aches, dysuria, bleeding, vomiting, abdominal pain   Past Medical History:  Diagnosis Date  . COPD (chronic obstructive pulmonary disease) (HCC)   . Coronary artery disease   . Cough    " ALL MY LIFE "  . Dyspnea   . GERD (gastroesophageal reflux disease)   . NSTEMI (non-ST elevated myocardial infarction) (HCC) 09/01/2016   DES to Cx/OM bifurcation  . Unstable angina (HCC) 08/2016    Past Surgical History:  Procedure Laterality Date  . ABDOMINAL HYSTERECTOMY    . CORONARY STENT INTERVENTION  09/05/2016   Successful complex PCI of the circumflex/OM bifurcation using a Synergy DES  . CORONARY STENT INTERVENTION N/A 09/05/2016   Procedure: Coronary Stent Intervention;  Surgeon: Tonny Bollmanooper, Michael, MD;  [facility];  Service:  Cardiovascular;  Laterality: N/A;  . LEFT HEART CATH AND CORONARY ANGIOGRAPHY N/A 09/02/2016   Procedure: Left Heart Cath and Coronary Angiography;  Surgeon: Corky CraftsVaranasi, Jayadeep S, MD;  [facility];  Service: Cardiovascular;  Laterality: N/A;     reports that she quit smoking about 4 weeks ago. Her smoking use included Cigarettes. She has a 25.00 pack-year smoking history. She has never used smokeless tobacco. She reports that she drinks alcohol. She reports that she does not use drugs. Mobility: Ambulatory  Allergies  Allergen Reactions  . Penicillins Anaphylaxis    Has patient had a PCN reaction causing immediate rash, facial/tongue/throat swelling, SOB or lightheadedness with hypotension: Yes Has patient had a PCN reaction causing severe rash involving mucus membranes or skin necrosis: yes Has patient had a PCN reaction that required hospitalization: no Has patient had a PCN reaction occurring within the last 10 years: no If all of the above answers are "NO", then may proceed with Cephalosporin use.   . Sulfa Antibiotics Other (See Comments)    Hands and feet blistered    Family History  Problem Relation Age of Onset  . Asthma Mother   . Heart attack Father 4045  . Cancer Other   . Heart attack Son 48     Prior to Admission medications   Medication Sig Start Date End Date Taking? Authorizing Provider  acetaminophen (TYLENOL) 500 MG tablet Take 1,000 mg by mouth every 6 (six) hours as needed for moderate pain.   Yes [provider]  ADVAIR DISKUS 250-50 MCG/DOSE AEPB Inhale 1 puff into the lungs at bedtime as needed (for shortness of  breath).  08/11/16  Yes [provider]  aspirin EC 81 MG EC tablet Take 1 tablet (81 mg total) by mouth daily. 09/07/16  Yes Duke, Roe Rutherford, PA  atorvastatin (LIPITOR) 80 MG tablet Take 1 tablet (80 mg total) by mouth daily at 6 PM. 09/06/16  Yes Duke, Roe Rutherford, PA  Chlorpheniramine Maleate (ALLERGY PO) Take 1  tablet by mouth daily. Equate brand   Yes [provider]  Cholecalciferol (VITAMIN D3) 2000 units TABS Take 2,000 Units by mouth daily.   Yes [provider]  Cinnamon 500 MG capsule Take 1,000 mg by mouth daily.   Yes [provider]  esomeprazole (NEXIUM) 40 MG capsule Take 40 mg by mouth daily. 08/17/16  Yes [provider]  Glucosamine-Chondroitin (MOVE FREE PO) Take 1 tablet by mouth daily.   Yes [provider]  nitroGLYCERIN (NITROSTAT) 0.4 MG SL tablet Place 1 tablet (0.4 mg total) under the tongue every 5 (five) minutes as needed for chest pain. 09/06/16  Yes Duke, Roe Rutherford, PA  Omega-3 Fatty Acids (FISH OIL OMEGA-3 PO) Take 1 capsule by mouth daily.   Yes [provider]  Probiotic Product (PROBIOTIC PO) Take 1 capsule by mouth daily.   Yes [provider]  ticagrelor (BRILINTA) 90 MG TABS tablet Take 1 tablet (90 mg total) by mouth 2 (two) times daily. 09/06/16  Yes Duke, Roe Rutherford, PA  VENTOLIN HFA 108 (90 Base) MCG/ACT inhaler Inhale 2 puffs into the lungs every 6 (six) hours as needed for wheezing or shortness of breath.  08/17/16  Yes [provider]  vitamin B-12 (CYANOCOBALAMIN) 500 MCG tablet Take 500 mcg by mouth daily.   Yes [provider]  vitamin C (ASCORBIC ACID) 500 MG tablet Take 500 mg by mouth daily.   Yes [provider]  ciprofloxacin (CIPRO) 500 MG tablet Take 500 mg by mouth 2 (two) times daily.    [provider]  metroNIDAZOLE (FLAGYL) 500 MG tablet Take 1 tablet (500 mg total) by mouth 2 (two) times daily. Patient not taking: Reported on 09/30/2016 09/15/16   Maia Plan, MD    Physical Exam: 97.9, 18, 60, 149/98, 99% on room air  Constitutional. Appears calm, comfortable.   Eyes. Pupils, irises, lids appear unremarkable.  ENT. Grossly normal hearing, lips and tongue.  Neck. No lymphadenopathy or masses. No thyromegaly.  Cardiovascular. Regular rate  and rhythm. No murmur, rub or gallop. No lower extremity edema.  Respiratory. Clear to auscultation bilaterally. No wheezes, rales or rhonchi. Normal respiratory effort.  Abdomen. Soft, nontender, nondistended. No hepatomegaly.  Skin. No rash or induration noted. Nontender to palpation.  Musculoskeletal. Grossly normal tone and strength all extremities. Digits and nails of the upper extremity is appear unremarkable.  Psychiatric. Grossly normal mood and affect. Speech fluent and appropriate.  Wt Readings from Last 3 Encounters:  09/30/16 78.1 kg (172 lb 1.6 oz)  09/13/16 79.8 kg (176 lb)  09/06/16 78 kg (171 lb 15.3 oz)    I have personally reviewed following labs and imaging studies  Labs:    Basic metabolic panel, BNP, troponin, CBC unremarkable.  Imaging studies:   Chest x-ray independently reviewed, atelectasis left base.  Medical tests:   EKG and apparently reviewed. Sinus rhythm, no acute changes.  Test discussed with performing physician:    Decision to obtain old records:     Review and summation of old records:   Cardiology outpatient visit 7/17: Coronary artery disease, status post stent  placement. No beta blocker secondary to nocturnal bradycardia. Continue aspirin, statin, and Brillinta.   Active Problems:   Chest pain   Assessment/Plan Chest pain with some typical features. Currently pain free. Initial EKG nonacute and troponin negative. -Trend troponin, monitor on telemetry -Continue aspirin, Brillinta, statin -If troponins negative and no recurrent pain, likely discharge 8/4. If condition changes, we'll discuss with cardiology.  CAD, status post NSTEMI and stent placement 08/2016 -Plan as above  Severity of Illness: The appropriate patient status for this patient is OBSERVATION. Observation status is judged to be reasonable and necessary in order to provide the required intensity of service to ensure the patient's safety. The patient's  presenting symptoms, physical exam findings, and initial radiographic and laboratory data in the context of their medical condition is felt to place them at decreased risk for further clinical deterioration. Furthermore, it is anticipated that the patient will be medically stable for discharge from the hospital within 2 midnights of admission. The following factors support the patient status of observation.   DVT prophylaxis: early ambulation Code Status: full Family Communication: fiance    Time spent: 5550 minutes  Brendia Sacksaniel Goodrich, MD  [facility] Direct contact: [phone] --Via amion app OR  --[email or web address]; password [email or web address]  7PM-7AM contact night coverage as above  09/30/2016, 4:08 PM

## 2016-10-01 DIAGNOSIS — I2510 Atherosclerotic heart disease of native coronary artery without angina pectoris: Secondary | ICD-10-CM

## 2016-10-01 DIAGNOSIS — R079 Chest pain, unspecified: Secondary | ICD-10-CM | POA: Diagnosis not present

## 2016-10-01 LAB — TROPONIN I: Troponin I: <0.03 ng/mL (ref <0.03)

## 2016-10-01 NOTE — Discharge Summary (Signed)
Physician Discharge Summary  Lisa SimsCarolyn [name] WJX:[id number] DOB: 06-26-1945 DOA: 09/30/2016  PCP: Ignatius SpeckingVyas, Dhruv B, MD  Admit date: 09/30/2016 Discharge date: 10/01/2016  Recommendations for Outpatient Follow-up:  1. Chest pain. Defer timing of follow-up to her primary cardiologist Dr. Tenny Crawoss. I have sent her a message.   Follow-up Information    Vyas, Angelina Pihhruv B, MD Follow up.   Specialty:  Internal Medicine Why:  as needed Contact information: [address] [place] [id number] [phone]            Discharge Diagnoses:  1. Chest pain 2. CAD  Discharge Condition: improved Disposition: home  Diet recommendation: heart healthy  Filed Weights   09/30/16 1105 09/30/16 1503  Weight: 78 kg (172 lb) 78.1 kg (172 lb 1.6 oz)    History of present illness:  71 year old woman PMH NSTEMI 90% occlusion of the circumflex/OM bifurcation requiring complex PCI and stent placement 08/2016 who presented to the emergency department with chest pain. Pain was relieved in the emergency department, initial troponin was negative and EKG nonacute. She was referred for observation after telephone discussion with Dr. Tenny Crawoss.  Hospital Course:  Patient was observed overnight, no further chest pain or shortness of breath. Troponins were negative, telemetry unremarkable. ACS ruled out. She was seen by cardiology who recommended continuing current medications and discharge home if enzymes are negative. She'll follow-up with her primary cardiologist as an outpatient.  Consultants: Cardiology. Procedures: None.  Today's assessment: S: Feels well. No chest pain or shortness of breath. O: Vitals: Afebrile, 97.8, 18, 66, 1:30/69, 94% on room air.   Constitutional. Appears calm, comfortable.  Cardiovascular. Regular rate and rhythm. No murmur, rub or gallop. No lower extremity edema.  Respiratory. Clear to auscultation bilaterally. No wheezes, rales or rhonchi. Normal respiratory effort.  Psychiatric.  Grossly normal mood and affect. Speech fluent and appropriate.   Troponins negative. Magnesium within normal limits.  EKG 8/4 independently reviewed. Sinus rhythm, no acute changes.  Discharge Instructions  Discharge Instructions    Activity as tolerated - No restrictions    Complete by:  As directed    Diet - low sodium heart healthy    Complete by:  As directed    Discharge instructions    Complete by:  As directed    Call your physician or seek immediate medical attention for chest pain, shortness of breath or worsening of condition.     Allergies as of 10/01/2016      Reactions   Penicillins Anaphylaxis   Has patient had a PCN reaction causing immediate rash, facial/tongue/throat swelling, SOB or lightheadedness with hypotension: Yes Has patient had a PCN reaction causing severe rash involving mucus membranes or skin necrosis: yes Has patient had a PCN reaction that required hospitalization: no Has patient had a PCN reaction occurring within the last 10 years: no If all of the above answers are "NO", then may proceed with Cephalosporin use.   Sulfa Antibiotics Other (See Comments)   Hands and feet blistered      Medication List    STOP taking these medications   ciprofloxacin 500 MG tablet Commonly known as:  CIPRO   metroNIDAZOLE 500 MG tablet Commonly known as:  FLAGYL     TAKE these medications   acetaminophen 500 MG tablet Commonly known as:  TYLENOL Take 1,000 mg by mouth every 6 (six) hours as needed for moderate pain.   ADVAIR DISKUS 250-50 MCG/DOSE Aepb Generic drug:  Fluticasone-Salmeterol Inhale 1 puff into the lungs at bedtime  as needed (for shortness of breath).   ALLERGY PO Take 1 tablet by mouth daily. Equate brand   aspirin 81 MG EC tablet Take 1 tablet (81 mg total) by mouth daily.   atorvastatin 80 MG tablet Commonly known as:  LIPITOR Take 1 tablet (80 mg total) by mouth daily at 6 PM.   Cinnamon 500 MG capsule Take 1,000 mg by mouth  daily.   esomeprazole 40 MG capsule Commonly known as:  NEXIUM Take 40 mg by mouth daily.   FISH OIL OMEGA-3 PO Take 1 capsule by mouth daily.   MOVE FREE PO Take 1 tablet by mouth daily.   nitroGLYCERIN 0.4 MG SL tablet Commonly known as:  NITROSTAT Place 1 tablet (0.4 mg total) under the tongue every 5 (five) minutes as needed for chest pain.   PROBIOTIC PO Take 1 capsule by mouth daily.   ticagrelor 90 MG Tabs tablet Commonly known as:  BRILINTA Take 1 tablet (90 mg total) by mouth 2 (two) times daily.   VENTOLIN HFA 108 (90 Base) MCG/ACT inhaler Generic drug:  albuterol Inhale 2 puffs into the lungs every 6 (six) hours as needed for wheezing or shortness of breath.   vitamin B-12 500 MCG tablet Commonly known as:  CYANOCOBALAMIN Take 500 mcg by mouth daily.   vitamin C 500 MG tablet Commonly known as:  ASCORBIC ACID Take 500 mg by mouth daily.   Vitamin D3 2000 units Tabs Take 2,000 Units by mouth daily.      Allergies  Allergen Reactions  . Penicillins Anaphylaxis    Has patient had a PCN reaction causing immediate rash, facial/tongue/throat swelling, SOB or lightheadedness with hypotension: Yes Has patient had a PCN reaction causing severe rash involving mucus membranes or skin necrosis: yes Has patient had a PCN reaction that required hospitalization: no Has patient had a PCN reaction occurring within the last 10 years: no If all of the above answers are "NO", then may proceed with Cephalosporin use.   . Sulfa Antibiotics Other (See Comments)    Hands and feet blistered    The results of significant diagnostics from this hospitalization (including imaging, microbiology, ancillary and laboratory) are listed below for reference.    Significant Diagnostic Studies: Dg Chest 2 View  Result Date: 09/30/2016 CLINICAL DATA:  Chest pain since last night. EXAM: CHEST  2 VIEW COMPARISON:  09/15/2016 FINDINGS: Normal heart size. Linear and patchy densities at the  left base compatible with atelectasis versus airspace disease. No pneumothorax or pleural effusion. IMPRESSION: Left basilar atelectasis versus airspace disease. Followup PA and lateral chest X-ray is recommended in 3-4 weeks following trial of antibiotic therapy to ensure resolution and exclude underlying malignancy. Electronically Signed   By: Jolaine ClickArthur  Hoss M.D.   On: 09/30/2016 11:28   Labs: Basic Metabolic Panel:  Recent Labs Lab 09/30/16 1100 09/30/16 1843  NA 139  --   K 3.7  --   CL 103  --   CO2 26  --   GLUCOSE 117*  --   BUN 13  --   CREATININE 0.72  --   CALCIUM 9.6  --   MG  --  1.9   CBC:  Recent Labs Lab 09/30/16 1100  WBC 10.2  HGB 15.6*  HCT 47.3*  MCV 94.8  PLT 274   Cardiac Enzymes:  Recent Labs Lab 09/30/16 1843 10/01/16 0004 10/01/16 0613  TROPONINI <0.03 <0.03 <0.03     Recent Labs  09/01/16 0024 09/30/16 1100  BNP  86.2 72.0    Active Problems:   Chest pain   Time coordinating discharge: 20 minutes  Signed:  Brendia Sacks, MD [facility] 10/01/2016, 12:16 PM

## 2016-10-01 NOTE — Progress Notes (Signed)
Discharge instructions given, verbalized understanding, out in stable condition via w/c with staff. 

## 2016-10-04 DIAGNOSIS — I2510 Atherosclerotic heart disease of native coronary artery without angina pectoris: Secondary | ICD-10-CM | POA: Diagnosis not present

## 2016-10-04 DIAGNOSIS — Z90710 Acquired absence of both cervix and uterus: Secondary | ICD-10-CM | POA: Diagnosis not present

## 2016-10-04 DIAGNOSIS — E7800 Pure hypercholesterolemia, unspecified: Secondary | ICD-10-CM | POA: Diagnosis not present

## 2016-10-04 DIAGNOSIS — B353 Tinea pedis: Secondary | ICD-10-CM | POA: Diagnosis not present

## 2016-10-04 DIAGNOSIS — Z6827 Body mass index (BMI) 27.0-27.9, adult: Secondary | ICD-10-CM | POA: Diagnosis not present

## 2016-10-04 DIAGNOSIS — J441 Chronic obstructive pulmonary disease with (acute) exacerbation: Secondary | ICD-10-CM | POA: Diagnosis not present

## 2016-10-04 DIAGNOSIS — J449 Chronic obstructive pulmonary disease, unspecified: Secondary | ICD-10-CM | POA: Diagnosis not present

## 2016-10-04 DIAGNOSIS — Z299 Encounter for prophylactic measures, unspecified: Secondary | ICD-10-CM | POA: Diagnosis not present

## 2016-10-04 DIAGNOSIS — Z713 Dietary counseling and surveillance: Secondary | ICD-10-CM | POA: Diagnosis not present

## 2016-10-06 ENCOUNTER — Telehealth: Payer: Self-pay | Admitting: Internal Medicine

## 2016-10-06 MED ORDER — CLOPIDOGREL BISULFATE 75 MG PO TABS: 300.0000 mg | ORAL_TABLET | Freq: Once | ORAL | 3 refills | Status: AC

## 2016-10-06 NOTE — Telephone Encounter (Signed)
Pt has recently been started on Brilinta. She states that since starting on it she has had trouble breathing. She said it has gotten worse over the past few nights, to where she cannot breathe at all while lying down. She has contacted her PCP, and was told it was probably the medication and she needed to contact us to get switched to a new medication. Dr. Tenny Crawoss is on vacation, so I will forward to DOD.

## 2016-10-06 NOTE — Telephone Encounter (Signed)
Patient informed and verbalized understanding of plan. 

## 2016-10-06 NOTE — Addendum Note (Signed)
Addended by: Eustace MooreANDERSON, Myeshia Fojtik M on: 10/06/2016 04:59 PM   Modules accepted: Orders

## 2016-10-06 NOTE — Telephone Encounter (Signed)
SOB is a common side effect of brillinta. We will change her to plavix. Take 300mg  x day 1, then 75mg  daily. Do not stop brillinta until she has the plavix prescription and is ready to take it. There should not be a day where she does not take either brillinta or plavix.   Dominga FerryJ Leam Madero MD

## 2016-10-11 ENCOUNTER — Emergency Department (HOSPITAL_COMMUNITY): Payer: MEDICARE

## 2016-10-11 ENCOUNTER — Encounter (HOSPITAL_COMMUNITY): Payer: Self-pay | Admitting: *Deleted

## 2016-10-11 ENCOUNTER — Observation Stay (HOSPITAL_COMMUNITY)
Admission: EM | Admit: 2016-10-11 | Discharge: 2016-10-12 | Disposition: A | Discharge status: Home / Self Care | Payer: MEDICARE | Attending: Internal Medicine | Admitting: Internal Medicine

## 2016-10-11 DIAGNOSIS — J449 Chronic obstructive pulmonary disease, unspecified: Secondary | ICD-10-CM | POA: Diagnosis not present

## 2016-10-11 DIAGNOSIS — K5792 Diverticulitis of intestine, part unspecified, without perforation or abscess without bleeding: Secondary | ICD-10-CM | POA: Diagnosis not present

## 2016-10-11 DIAGNOSIS — R109 Unspecified abdominal pain: Secondary | ICD-10-CM | POA: Diagnosis not present

## 2016-10-11 DIAGNOSIS — R1032 Left lower quadrant pain: Secondary | ICD-10-CM | POA: Diagnosis present

## 2016-10-11 DIAGNOSIS — Z7982 Long term (current) use of aspirin: Secondary | ICD-10-CM | POA: Insufficient documentation

## 2016-10-11 DIAGNOSIS — I2510 Atherosclerotic heart disease of native coronary artery without angina pectoris: Secondary | ICD-10-CM | POA: Diagnosis not present

## 2016-10-11 DIAGNOSIS — E785 Hyperlipidemia, unspecified: Secondary | ICD-10-CM | POA: Diagnosis present

## 2016-10-11 DIAGNOSIS — Z955 Presence of coronary angioplasty implant and graft: Secondary | ICD-10-CM | POA: Insufficient documentation

## 2016-10-11 DIAGNOSIS — Z79899 Other long term (current) drug therapy: Secondary | ICD-10-CM | POA: Insufficient documentation

## 2016-10-11 DIAGNOSIS — K219 Gastro-esophageal reflux disease without esophagitis: Secondary | ICD-10-CM | POA: Diagnosis present

## 2016-10-11 DIAGNOSIS — Z87891 Personal history of nicotine dependence: Secondary | ICD-10-CM | POA: Diagnosis not present

## 2016-10-11 DIAGNOSIS — K5732 Diverticulitis of large intestine without perforation or abscess without bleeding: Principal | ICD-10-CM | POA: Insufficient documentation

## 2016-10-11 DIAGNOSIS — Z720 Tobacco use: Secondary | ICD-10-CM | POA: Diagnosis present

## 2016-10-11 LAB — CBC WITH DIFFERENTIAL/PLATELET
Basophils Absolute: 0 10*3/uL (ref 0.0–0.1)
Basophils Relative: 0 %
EOS PCT: 2 %
Eosinophils Absolute: 0.2 10*3/uL (ref 0.0–0.7)
HCT: 42.7 % (ref 36.0–46.0)
Hemoglobin: 14.1 g/dL (ref 12.0–15.0)
LYMPHS ABS: 2.1 10*3/uL (ref 0.7–4.0)
LYMPHS PCT: 20 %
MCH: 31.2 pg (ref 26.0–34.0)
MCHC: 33 g/dL (ref 30.0–36.0)
MCV: 94.5 fL (ref 78.0–100.0)
MONO ABS: 0.9 10*3/uL (ref 0.1–1.0)
MONOS PCT: 9 %
Neutro Abs: 7.3 10*3/uL (ref 1.7–7.7)
Neutrophils Relative %: 69 %
PLATELETS: 190 10*3/uL (ref 150–400)
RBC: 4.52 MIL/uL (ref 3.87–5.11)
RDW: 13.9 % (ref 11.5–15.5)
WBC: 10.5 10*3/uL (ref 4.0–10.5)

## 2016-10-11 LAB — COMPREHENSIVE METABOLIC PANEL
ALT: 22 U/L (ref 14–54)
AST: 20 U/L (ref 15–41)
Albumin: 3.7 g/dL (ref 3.5–5.0)
Alkaline Phosphatase: 111 U/L (ref 38–126)
Anion gap: 7 (ref 5–15)
BUN: 15 mg/dL (ref 6–20)
CHLORIDE: 107 mmol/L (ref 101–111)
CO2: 25 mmol/L (ref 22–32)
CREATININE: 0.66 mg/dL (ref 0.44–1.00)
Calcium: 8.8 mg/dL — ABNORMAL LOW (ref 8.9–10.3)
GFR calc Af Amer: >60 mL/min (ref >60)
GFR calc non Af Amer: >60 mL/min (ref >60)
GLUCOSE: 104 mg/dL — AB (ref 65–99)
Potassium: 4 mmol/L (ref 3.5–5.1)
SODIUM: 139 mmol/L (ref 135–145)
Total Bilirubin: 0.5 mg/dL (ref 0.3–1.2)
Total Protein: 7.2 g/dL (ref 6.5–8.1)

## 2016-10-11 MED ORDER — MOMETASONE FURO-FORMOTEROL FUM 200-5 MCG/ACT IN AERO
2.0000 | INHALATION_SPRAY | Freq: Two times a day (BID) | RESPIRATORY_TRACT | Status: DC
  Administered 2016-10-11 – 2016-10-12 (×2): 2 via RESPIRATORY_TRACT
  Filled 2016-10-11: qty 8.8

## 2016-10-11 MED ORDER — ASPIRIN EC 81 MG PO TBEC
81.0000 mg | DELAYED_RELEASE_TABLET | Freq: Every day | ORAL | Status: DC
  Administered 2016-10-11 – 2016-10-12 (×2): 81 mg via ORAL
  Filled 2016-10-11 (×2): qty 1

## 2016-10-11 MED ORDER — ONDANSETRON HCL 4 MG PO TABS: 4.0000 mg | ORAL_TABLET | Freq: Four times a day (QID) | ORAL | Status: DC | PRN

## 2016-10-11 MED ORDER — OMEGA-3-ACID ETHYL ESTERS 1 G PO CAPS
1.0000 | ORAL_CAPSULE | Freq: Every day | ORAL | Status: DC
  Administered 2016-10-11 – 2016-10-12 (×2): 1 g via ORAL
  Filled 2016-10-11 (×2): qty 1

## 2016-10-11 MED ORDER — ONDANSETRON HCL 4 MG/2ML IJ SOLN
4.0000 mg | Freq: Once | INTRAMUSCULAR | Status: AC
  Administered 2016-10-11: 4 mg via INTRAVENOUS
  Filled 2016-10-11: qty 2

## 2016-10-11 MED ORDER — MORPHINE SULFATE (PF) 4 MG/ML IV SOLN
4.0000 mg | Freq: Once | INTRAVENOUS | Status: AC
  Administered 2016-10-11: 4 mg via INTRAVENOUS
  Filled 2016-10-11: qty 1

## 2016-10-11 MED ORDER — IOPAMIDOL (ISOVUE-300) INJECTION 61%
100.0000 mL | Freq: Once | INTRAVENOUS | Status: AC | PRN
  Administered 2016-10-11: 100 mL via INTRAVENOUS

## 2016-10-11 MED ORDER — CLOPIDOGREL BISULFATE 75 MG PO TABS
75.0000 mg | ORAL_TABLET | Freq: Every day | ORAL | Status: DC
  Administered 2016-10-11 – 2016-10-12 (×2): 75 mg via ORAL
  Filled 2016-10-11 (×2): qty 1

## 2016-10-11 MED ORDER — ATORVASTATIN CALCIUM 40 MG PO TABS
80.0000 mg | ORAL_TABLET | Freq: Every day | ORAL | Status: DC
  Administered 2016-10-11: 80 mg via ORAL
  Filled 2016-10-11: qty 2

## 2016-10-11 MED ORDER — ENOXAPARIN SODIUM 40 MG/0.4ML ~~LOC~~ SOLN
40.0000 mg | SUBCUTANEOUS | Status: DC
  Administered 2016-10-11: 40 mg via SUBCUTANEOUS
  Filled 2016-10-11: qty 0.4

## 2016-10-11 MED ORDER — ALBUTEROL SULFATE (2.5 MG/3ML) 0.083% IN NEBU: 2.5000 mg | INHALATION_SOLUTION | Freq: Four times a day (QID) | RESPIRATORY_TRACT | Status: DC | PRN

## 2016-10-11 MED ORDER — PANTOPRAZOLE SODIUM 40 MG PO TBEC
80.0000 mg | DELAYED_RELEASE_TABLET | Freq: Every day | ORAL | Status: DC
  Administered 2016-10-11: 80 mg via ORAL
  Filled 2016-10-11: qty 2

## 2016-10-11 MED ORDER — ACETAMINOPHEN 325 MG PO TABS: 650.0000 mg | ORAL_TABLET | Freq: Four times a day (QID) | ORAL | Status: DC | PRN

## 2016-10-11 MED ORDER — ACETAMINOPHEN 650 MG RE SUPP: 650.0000 mg | Freq: Four times a day (QID) | RECTAL | Status: DC | PRN

## 2016-10-11 MED ORDER — SODIUM CHLORIDE 0.9 % IV SOLN
1.0000 g | Freq: Three times a day (TID) | INTRAVENOUS | Status: DC
  Administered 2016-10-11 – 2016-10-12 (×3): 1 g via INTRAVENOUS
  Filled 2016-10-11 (×7): qty 1

## 2016-10-11 MED ORDER — ONDANSETRON HCL 4 MG/2ML IJ SOLN: 4.0000 mg | Freq: Four times a day (QID) | INTRAMUSCULAR | Status: DC | PRN

## 2016-10-11 MED ORDER — OXYCODONE HCL 5 MG PO TABS
5.0000 mg | ORAL_TABLET | ORAL | Status: DC | PRN
  Administered 2016-10-11 – 2016-10-12 (×3): 5 mg via ORAL
  Filled 2016-10-11 (×3): qty 1

## 2016-10-11 MED ORDER — DOCUSATE SODIUM 100 MG PO CAPS
100.0000 mg | ORAL_CAPSULE | Freq: Two times a day (BID) | ORAL | Status: DC
  Administered 2016-10-11 – 2016-10-12 (×3): 100 mg via ORAL
  Filled 2016-10-11 (×3): qty 1

## 2016-10-11 MED ORDER — NITROGLYCERIN 0.4 MG SL SUBL: 0.4000 mg | SUBLINGUAL_TABLET | SUBLINGUAL | Status: DC | PRN

## 2016-10-11 MED ORDER — SODIUM CHLORIDE 0.9 % IV SOLN
INTRAVENOUS | Status: DC
  Administered 2016-10-11: 12:00:00 via INTRAVENOUS

## 2016-10-11 NOTE — ED Provider Notes (Signed)
[facility] DEPT Provider Note   CSN: [id number] Arrival date & time: 10/11/16  0423     History   Chief Complaint Chief Complaint  Patient presents with  . Abdominal Pain    HPI Lisa [name] is a 71 y.o. female.  HPI  This is a 71 year old female with a history of coronary artery disease, COPD, and recent history of diverticulitis who presents with left lower quadrant abdominal pain. Patient reports that she was treated for diverticulitis approximately 3 weeks ago. She finished a full course of Cipro and Flagyl. She states "I don't think it never really cleared up." She's had waxing and waning left lower quadrant pain that tonight she had worsening left lower quadrant pain. It is sharp and nonradiating. Currently 7 out of 10. She denies any vomiting, diarrhea, bloody stools. She denies any fevers. Of note, patient recently had a stent placed as well. She denies any chest pain or shortness of breath.  Past Medical History:  Diagnosis Date  . COPD (chronic obstructive pulmonary disease) (HCC)   . Coronary artery disease   . Cough    " ALL MY LIFE "  . Dyspnea   . GERD (gastroesophageal reflux disease)   . NSTEMI (non-ST elevated myocardial infarction) (HCC) 09/01/2016   DES to Cx/OM bifurcation  . Unstable angina (HCC) 08/2016    Patient Active Problem List   Diagnosis Date Noted  . Coronary artery disease involving native coronary artery of native heart without angina pectoris   . Chest pain 09/30/2016  . Non-ST elevation (NSTEMI) myocardial infarction (HCC) 09/06/2016  . Coronary artery disease involving native coronary artery of native heart with unstable angina pectoris (HCC)   . Hyperlipidemia LDL goal <70   . Hypokalemia   . Unstable angina (HCC) 08/31/2016  . GERD (gastroesophageal reflux disease) 08/31/2016  . COPD (chronic obstructive pulmonary disease) (HCC) 08/31/2016  . Tobacco abuse 08/31/2016  . Chronic cough 08/31/2016    Past Surgical History:    Procedure Laterality Date  . ABDOMINAL HYSTERECTOMY    . CORONARY STENT INTERVENTION  09/05/2016   Successful complex PCI of the circumflex/OM bifurcation using a Synergy DES  . CORONARY STENT INTERVENTION N/A 09/05/2016   Procedure: Coronary Stent Intervention;  Surgeon: Tonny Bollman, MD;  Location: [facility] INVASIVE CV LAB;  Service: Cardiovascular;  Laterality: N/A;  . LEFT HEART CATH AND CORONARY ANGIOGRAPHY N/A 09/02/2016   Procedure: Left Heart Cath and Coronary Angiography;  Surgeon: Corky Crafts, MD;  Location: [facility] INVASIVE CV LAB;  Service: Cardiovascular;  Laterality: N/A;    OB History    Gravida Para Term Preterm AB Living   7 6 6   1 4    SAB TAB Ectopic Multiple Live Births   1               Home Medications    Prior to Admission medications   Medication Sig Start Date End Date Taking? Authorizing Provider  acetaminophen (TYLENOL) 500 MG tablet Take 1,000 mg by mouth every 6 (six) hours as needed for moderate pain.    [provider]  ADVAIR DISKUS 250-50 MCG/DOSE AEPB Inhale 1 puff into the lungs at bedtime as needed (for shortness of breath).  08/11/16   [provider]  aspirin EC 81 MG EC tablet Take 1 tablet (81 mg total) by mouth daily. 09/07/16   [facility], Roe Rutherford, PA  atorvastatin (LIPITOR) 80 MG tablet Take 1 tablet (80 mg total) by mouth daily at 6 PM. 09/06/16  [facility], Roe Rutherford, PA  Chlorpheniramine Maleate (ALLERGY PO) Take 1 tablet by mouth daily. Equate brand    [provider]  Cholecalciferol (VITAMIN D3) 2000 units TABS Take 2,000 Units by mouth daily.    [provider]  Cinnamon 500 MG capsule Take 1,000 mg by mouth daily.    [provider]  esomeprazole (NEXIUM) 40 MG capsule Take 40 mg by mouth daily. 08/17/16   [provider]  Glucosamine-Chondroitin (MOVE FREE PO) Take 1 tablet by mouth daily.    [provider]  nitroGLYCERIN (NITROSTAT) 0.4 MG SL tablet Place 1 tablet (0.4 mg  total) under the tongue every 5 (five) minutes as needed for chest pain. 09/06/16   [facility], Roe Rutherford, PA  Omega-3 Fatty Acids (FISH OIL OMEGA-3 PO) Take 1 capsule by mouth daily.    [provider]  Probiotic Product (PROBIOTIC PO) Take 1 capsule by mouth daily.    [provider]  VENTOLIN HFA 108 (90 Base) MCG/ACT inhaler Inhale 2 puffs into the lungs every 6 (six) hours as needed for wheezing or shortness of breath.  08/17/16   [provider]  vitamin B-12 (CYANOCOBALAMIN) 500 MCG tablet Take 500 mcg by mouth daily.    [provider]  vitamin C (ASCORBIC ACID) 500 MG tablet Take 500 mg by mouth daily.    [provider]    Family History Family History  Problem Relation Age of Onset  . Asthma Mother   . Heart attack Father 36  . Cancer Other   . Heart attack Son 75    Social History Social History  Substance Use Topics  . Smoking status: Former Smoker    Packs/day: 0.50    Years: 50.00    Types: Cigarettes    Quit date: 08/31/2016  . Smokeless tobacco: Never Used  . Alcohol use Yes     Comment: occas     Allergies   Penicillins and Sulfa antibiotics   Review of Systems Review of Systems  Constitutional: Negative for fever.  Respiratory: Negative for shortness of breath.   Cardiovascular: Negative for chest pain.  Gastrointestinal: Positive for abdominal pain. Negative for blood in stool, constipation, diarrhea and vomiting.  Genitourinary: Negative for dysuria.  All other systems reviewed and are negative.    Physical Exam Updated Vital Signs BP 117/72   Pulse 63   Temp 98.7 F (37.1 C)   Resp 19   Ht 5' 6.5" (1.689 m)   Wt 78 kg (172 lb)   LMP 09/01/2016   SpO2 96%   BMI 27.35 kg/m   Physical Exam  Constitutional: She is oriented to person, place, and time. She appears well-developed and well-nourished. No distress.  HENT:  Head: Normocephalic and atraumatic.  Neck: Neck supple.  Cardiovascular: Normal  rate, regular rhythm and normal heart sounds.   Pulmonary/Chest: Effort normal and breath sounds normal. No respiratory distress. She has no wheezes.  Abdominal: Soft. Bowel sounds are normal. There is tenderness.  Left lower quadrant and suprapubic tenderness to palpation, voluntary guarding, no mass or rebound  Neurological: She is alert and oriented to person, place, and time.  Skin: Skin is warm and dry.  Psychiatric: She has a normal mood and affect.  Nursing note and vitals reviewed.    ED Treatments / Results  Labs (all labs ordered are listed, but only abnormal results are displayed) Labs Reviewed  COMPREHENSIVE METABOLIC PANEL - Abnormal; Notable for the following:  Result Value   Glucose, Bld 104 (*)    Calcium 8.8 (*)    All other components within normal limits  CBC WITH DIFFERENTIAL/PLATELET  URINALYSIS, ROUTINE W REFLEX MICROSCOPIC    EKG  EKG Interpretation None       Radiology Ct Abdomen Pelvis W Contrast  Result Date: 10/11/2016 CLINICAL DATA:  Acute onset of left-sided abdominal pain. Initial encounter. EXAM: CT ABDOMEN AND PELVIS WITH CONTRAST TECHNIQUE: Multidetector CT imaging of the abdomen and pelvis was performed using the standard protocol following bolus administration of intravenous contrast. CONTRAST:  100mL ISOVUE-300 IOPAMIDOL (ISOVUE-300) INJECTION 61% COMPARISON:  CT of the abdomen and pelvis performed 09/15/2016 FINDINGS: Lower chest: Mild left basilar opacity likely reflects atelectasis. The visualized portions of the mediastinum are unremarkable. Hepatobiliary: Scattered small hypodensities within the liver measure up to 8 mm in size, nonspecific in appearance but likely benign. The gallbladder is unremarkable in appearance. The common bile duct remains normal in caliber. Pancreas: The pancreas is within normal limits. Spleen: The spleen is unremarkable in appearance. Adrenals/Urinary Tract: The adrenal glands are unremarkable in appearance. A  small left renal cyst is noted. There is no evidence of hydronephrosis. No renal or ureteral stones are identified. No perinephric stranding is seen. Stomach/Bowel: The stomach is unremarkable in appearance. The small bowel is within normal limits. The appendix is normal in caliber, without evidence of appendicitis. The colon is unremarkable in appearance. Mild soft tissue inflammation is noted about an inflamed diverticulum at the distal descending colon, with trace associated free fluid, compatible with acute diverticulitis. There is no evidence of perforation or abscess formation at this time. Scattered diverticulosis is noted along the descending and sigmoid colon. Vascular/Lymphatic: Scattered calcification is seen along the abdominal aorta and its branches. The abdominal aorta is otherwise grossly unremarkable. The inferior vena cava is grossly unremarkable. A retroaortic left renal vein is noted; part of the left renal vein empties into the left common iliac vein. No retroperitoneal lymphadenopathy is seen. No pelvic sidewall lymphadenopathy is identified. Reproductive: The bladder is mildly distended and grossly unremarkable. Trace air within the bladder may reflect recent Foley catheter placement. The patient is status post hysterectomy. No suspicious adnexal masses are seen. Other: No additional soft tissue abnormalities are seen. Musculoskeletal: No acute osseous abnormalities are identified. Multilevel vacuum phenomenon is noted along the lumbar spine, with endplate sclerotic change noted at the upper lumbar spine. The visualized musculature is unremarkable in appearance. IMPRESSION: 1. Mild acute diverticulitis at the distal descending colon, with mild soft tissue inflammation, inflamed diverticulum and trace associated free fluid. No evidence of perforation or abscess formation at this time. 2. Scattered diverticulosis along the descending and sigmoid colon. 3. Scattered aortic atherosclerosis. 4.  Small hypodensities within the liver measure up to 8 mm in size, nonspecific in appearance but likely benign. 5. Mild left basilar airspace opacity likely reflects atelectasis. 6. Mild degenerative change along the lumbar spine. Electronically Signed   By: Roanna RaiderJeffery  Chang M.D.   On: 10/11/2016 06:34    Procedures Procedures (including critical care time)  Medications Ordered in ED Medications  morphine 4 MG/ML injection 4 mg (4 mg Intravenous Given 10/11/16 0504)  ondansetron (ZOFRAN) injection 4 mg (4 mg Intravenous Given 10/11/16 0504)  iopamidol (ISOVUE-300) 61 % injection 100 mL (100 mLs Intravenous Contrast Given 10/11/16 [id number])     Initial Impression / Assessment and Plan / ED Course  I have reviewed the triage vital signs and the nursing notes.  Pertinent labs &  imaging results that were available during my care of the patient were reviewed by me and considered in my medical decision making (see chart for details).     Patient presents with worsening left lower quadrant pain. She finished a full course of antibiotics for diverticulitis several weeks ago but reports that her symptoms have waxed and waned and acutely worsened last night. She is afebrile and otherwise nontoxic. She does have some voluntary guarding on exam. Basic labwork obtained and patient given pain and nausea medication. Given abdominal exam findings, CT was ordered to rule out perforation or abscess. CT scan does not show any complications of diverticulitis but does show persistent inflammation consistent with diverticulitis. Given patient's continued and worsening symptoms, I would treat this as a failure of outpatient antibiotics. Patient will need admission for IV antibiotics. I discussed this with the admitting hospitalist. Ideally, patient will be placed on a different regimen; however, she has an anaphylactic reaction to penicillins and therefore Zosyn is not a good choice. I attempted to consult the pharmacy but they  are not open at this point. Will defer antibiotics choice to the inpatient team. Patient is stable for admission at this time.    Final Clinical Impressions(s) / ED Diagnoses   Final diagnoses:  Diverticulitis    New Prescriptions New Prescriptions   No medications on file     Shon Baton, MD 10/11/16 [phone]

## 2016-10-11 NOTE — H&P (Signed)
History and Physical    Lisa [name] ZOX:[id number] DOB: 1945-05-31 DOA: 10/11/2016  Referring MD/NP/PA: Ross Marcus, EDP PCP: Kirstie Peri, MD  Patient coming from: Home  Chief Complaint: Abdominal pain  HPI: Lisa [name] is a 71 y.o. female with history significant for coronary artery disease status post stenting in July 2018, she has also had multiple episodes of acute diverticulitis. Most recently about one month ago for which she completed a seven-day course of Cipro and Flagyl. She presents to the hospital today with recurrent severe 10 out of 10 pain of her left lower quadrant, denies having nausea and vomiting. Pain was sharp and crampy at the same time. She has been afebrile. No leukocytosis on exam. CT abdomen and pelvis shows multiple acute diverticulitis at the distal descending colon without perforation or abscess. Given multiple presentations for the same, EDP thought it prudent to admit her to the hospital for IV antibiotics and admission was requested.  Past Medical/Surgical History: Past Medical History:  Diagnosis Date  . COPD (chronic obstructive pulmonary disease) (HCC)   . Coronary artery disease   . Cough    " ALL MY LIFE "  . Dyspnea   . GERD (gastroesophageal reflux disease)   . NSTEMI (non-ST elevated myocardial infarction) (HCC) 09/01/2016   DES to Cx/OM bifurcation  . Unstable angina (HCC) 08/2016    Past Surgical History:  Procedure Laterality Date  . ABDOMINAL HYSTERECTOMY    . CORONARY STENT INTERVENTION  09/05/2016   Successful complex PCI of the circumflex/OM bifurcation using a Synergy DES  . CORONARY STENT INTERVENTION N/A 09/05/2016   Procedure: Coronary Stent Intervention;  Surgeon: Tonny Bollman, MD;  Location: [facility] INVASIVE CV LAB;  Service: Cardiovascular;  Laterality: N/A;  . LEFT HEART CATH AND CORONARY ANGIOGRAPHY N/A 09/02/2016   Procedure: Left Heart Cath and Coronary Angiography;  Surgeon: Corky Crafts, MD;  Location: [facility]  INVASIVE CV LAB;  Service: Cardiovascular;  Laterality: N/A;    Social History:  reports that she quit smoking about 5 weeks ago. Her smoking use included Cigarettes. She has a 25.00 pack-year smoking history. She has never used smokeless tobacco. She reports that she drinks alcohol. She reports that she does not use drugs.  Allergies: Allergies  Allergen Reactions  . Brilinta [Ticagrelor] Shortness Of Breath  . Penicillins Anaphylaxis    Has patient had a PCN reaction causing immediate rash, facial/tongue/throat swelling, SOB or lightheadedness with hypotension: Yes Has patient had a PCN reaction causing severe rash involving mucus membranes or skin necrosis: yes Has patient had a PCN reaction that required hospitalization: no Has patient had a PCN reaction occurring within the last 10 years: no If all of the above answers are "NO", then may proceed with Cephalosporin use.   . Sulfa Antibiotics Other (See Comments)    Hands and feet blistered    Family History:  Family History  Problem Relation Age of Onset  . Asthma Mother   . Heart attack Father 45  . Cancer Other   . Heart attack Son 48    Prior to Admission medications   Medication Sig Start Date End Date Taking? Authorizing Provider  acetaminophen (TYLENOL) 500 MG tablet Take 1,000 mg by mouth every 6 (six) hours as needed for moderate pain.   Yes [provider]  ADVAIR DISKUS 250-50 MCG/DOSE AEPB Inhale 1 puff into the lungs at bedtime as needed (for shortness of breath).  08/11/16  Yes [provider]  aspirin EC 81 MG EC tablet  Take 1 tablet (81 mg total) by mouth daily. 09/07/16  Yes Duke, Roe Rutherford, PA  atorvastatin (LIPITOR) 80 MG tablet Take 1 tablet (80 mg total) by mouth daily at 6 PM. 09/06/16  Yes Duke, Roe Rutherford, PA  Chlorpheniramine Maleate (ALLERGY PO) Take 1 tablet by mouth daily. Equate brand   Yes [provider]  Cholecalciferol (VITAMIN D3) 2000 units TABS Take 2,000 Units  by mouth daily.   Yes [provider]  Cinnamon 500 MG capsule Take 1,000 mg by mouth daily.   Yes [provider]  clopidogrel (PLAVIX) 75 MG tablet Take 75 mg by mouth daily.   Yes [provider]  esomeprazole (NEXIUM) 40 MG capsule Take 40 mg by mouth daily. 08/17/16  Yes [provider]  Glucosamine-Chondroitin (MOVE FREE PO) Take 1 tablet by mouth daily.   Yes [provider]  nitroGLYCERIN (NITROSTAT) 0.4 MG SL tablet Place 1 tablet (0.4 mg total) under the tongue every 5 (five) minutes as needed for chest pain. 09/06/16  Yes Duke, Roe Rutherford, PA  Probiotic Product (PROBIOTIC PO) Take 1 capsule by mouth daily.   Yes [provider]  vitamin B-12 (CYANOCOBALAMIN) 500 MCG tablet Take 500 mcg by mouth daily.   Yes [provider]  vitamin C (ASCORBIC ACID) 500 MG tablet Take 500 mg by mouth daily.   Yes [provider]  Omega-3 Fatty Acids (FISH OIL OMEGA-3 PO) Take 1 capsule by mouth daily.    [provider]  VENTOLIN HFA 108 (90 Base) MCG/ACT inhaler Inhale 2 puffs into the lungs every 6 (six) hours as needed for wheezing or shortness of breath.  08/17/16   [provider]    Review of Systems:  Constitutional: Denies fever, chills, diaphoresis, appetite change and fatigue.  HEENT: Denies photophobia, eye pain, redness, hearing loss, ear pain, congestion, sore throat, rhinorrhea, sneezing, mouth sores, trouble swallowing, neck pain, neck stiffness and tinnitus.   Respiratory: Denies SOB, DOE, cough, chest tightness,  and wheezing.   Cardiovascular: Denies chest pain, palpitations and leg swelling.  Gastrointestinal: Denies nausea, vomiting,  diarrhea, constipation, blood in stool and abdominal distention.  Genitourinary: Denies dysuria, urgency, frequency, hematuria, flank pain and difficulty urinating.  Endocrine: Denies: hot or cold intolerance, sweats, changes in hair or nails, polyuria,  polydipsia. Musculoskeletal: Denies myalgias, back pain, joint swelling, arthralgias and gait problem.  Skin: Denies pallor, rash and wound.  Neurological: Denies dizziness, seizures, syncope, weakness, light-headedness, numbness and headaches.  Hematological: Denies adenopathy. Easy bruising, personal or family bleeding history  Psychiatric/Behavioral: Denies suicidal ideation, mood changes, confusion, nervousness, sleep disturbance and agitation    Physical Exam: Vitals:   10/11/16 0700 10/11/16 0730 10/11/16 0800 10/11/16 0826  BP: 125/69 117/72 105/62 136/71  Pulse: 65 63 61 81  Resp:    18  Temp:    98.2 F (36.8 C)  TempSrc:    Oral  SpO2: 98% 96% 96% (!) 73%  Weight:    78.7 kg (173 lb 9.6 oz)  Height:    5' 6.5" (1.689 m)     Constitutional: NAD, calm, comfortable Eyes: PERRL, lids and conjunctivae normal ENMT: Mucous membranes are moist. Posterior pharynx clear of any exudate or lesions.Normal dentition.  Neck: normal, supple, no masses, no thyromegaly Respiratory: clear to auscultation bilaterally, no wheezing, no crackles. Normal respiratory effort. No accessory muscle use.  Cardiovascular: Regular rate and rhythm, no murmurs / rubs / gallops. No extremity edema. 2+ pedal pulses. No carotid bruits.  Abdomen: Tenderness to palpation of the left lower quadrant no masses palpated. No hepatosplenomegaly. Bowel sounds positive.  Musculoskeletal: no clubbing / cyanosis. No joint deformity upper and lower extremities. Good ROM, no contractures. Normal muscle tone.  Skin: no rashes, lesions, ulcers. No induration Neurologic: CN 2-12 grossly intact. Sensation intact, DTR normal. Strength 5/5 in all 4.  Psychiatric: Normal judgment and insight. Alert and oriented x 3. Normal mood.    Labs on Admission: I have personally reviewed the following labs and imaging studies  CBC:  Recent Labs Lab 10/11/16 0437  WBC 10.5  NEUTROABS 7.3  HGB 14.1  HCT 42.7  MCV 94.5  PLT 190     Basic Metabolic Panel:  Recent Labs Lab 10/11/16 0437  NA 139  K 4.0  CL 107  CO2 25  GLUCOSE 104*  BUN 15  CREATININE 0.66  CALCIUM 8.8*   GFR: Estimated Creatinine Clearance: 67 mL/min (by C-G formula based on SCr of 0.66 mg/dL). Liver Function Tests:  Recent Labs Lab 10/11/16 0437  AST 20  ALT 22  ALKPHOS 111  BILITOT 0.5  PROT 7.2  ALBUMIN 3.7   No results for input(s): LIPASE, AMYLASE in the last 168 hours. No results for input(s): AMMONIA in the last 168 hours. Coagulation Profile: No results for input(s): INR, PROTIME in the last 168 hours. Cardiac Enzymes: No results for input(s): CKTOTAL, CKMB, CKMBINDEX, TROPONINI in the last 168 hours. BNP (last 3 results) No results for input(s): PROBNP in the last 8760 hours. HbA1C: No results for input(s): HGBA1C in the last 72 hours. CBG: No results for input(s): GLUCAP in the last 168 hours. Lipid Profile: No results for input(s): CHOL, HDL, LDLCALC, TRIG, CHOLHDL, LDLDIRECT in the last 72 hours. Thyroid Function Tests: No results for input(s): TSH, T4TOTAL, FREET4, T3FREE, THYROIDAB in the last 72 hours. Anemia Panel: No results for input(s): VITAMINB12, FOLATE, FERRITIN, TIBC, IRON, RETICCTPCT in the last 72 hours. Urine analysis:    Component Value Date/Time   COLORURINE YELLOW 09/15/2016 1657   APPEARANCEUR CLEAR 09/15/2016 1657   LABSPEC 1.020 09/15/2016 1657   PHURINE 6.0 09/15/2016 1657   GLUCOSEU NEGATIVE 09/15/2016 1657   HGBUR NEGATIVE 09/15/2016 1657   BILIRUBINUR NEGATIVE 09/15/2016 1657   KETONESUR NEGATIVE 09/15/2016 1657   PROTEINUR NEGATIVE 09/15/2016 1657   UROBILINOGEN 0.2 09/26/2009 1632   NITRITE NEGATIVE 09/15/2016 1657   LEUKOCYTESUR NEGATIVE 09/15/2016 1657   Sepsis Labs: @LABRCNTIP (procalcitonin:4,lacticidven:4) )No results found for this or any previous visit (from the past 240 hour(s)).   Radiological Exams on Admission: Ct Abdomen Pelvis W Contrast  Result Date:  10/11/2016 CLINICAL DATA:  Acute onset of left-sided abdominal pain. Initial encounter. EXAM: CT ABDOMEN AND PELVIS WITH CONTRAST TECHNIQUE: Multidetector CT imaging of the abdomen and pelvis was performed using the standard protocol following bolus administration of intravenous contrast. CONTRAST:  ISOVUE-300 IOPAMIDOL (ISOVUE-300) INJECTION 61% COMPARISON:  CT of the abdomen and pelvis performed 09/15/2016 FINDINGS: Lower chest: Mild left basilar opacity likely reflects atelectasis. The visualized portions of the mediastinum are unremarkable. Hepatobiliary: Scattered small hypodensities within the liver measure up to 8 mm in size, nonspecific in appearance but likely benign. The gallbladder is unremarkable in appearance. The common bile duct remains normal in caliber. Pancreas: The pancreas is within normal limits. Spleen: The spleen is unremarkable in appearance. Adrenals/Urinary Tract: The adrenal glands are unremarkable in appearance. A small left renal cyst is noted. There is no evidence of hydronephrosis. No renal or ureteral stones are identified.  No perinephric stranding is seen. Stomach/Bowel: The stomach is unremarkable in appearance. The small bowel is within normal limits. The appendix is normal in caliber, without evidence of appendicitis. The colon is unremarkable in appearance. Mild soft tissue inflammation is noted about an inflamed diverticulum at the distal descending colon, with trace associated free fluid, compatible with acute diverticulitis. There is no evidence of perforation or abscess formation at this time. Scattered diverticulosis is noted along the descending and sigmoid colon. Vascular/Lymphatic: Scattered calcification is seen along the abdominal aorta and its branches. The abdominal aorta is otherwise grossly unremarkable. The inferior vena cava is grossly unremarkable. A retroaortic left renal vein is noted; part of the left renal vein empties into the left common iliac vein.  No retroperitoneal lymphadenopathy is seen. No pelvic sidewall lymphadenopathy is identified. Reproductive: The bladder is mildly distended and grossly unremarkable. Trace air within the bladder may reflect recent Foley catheter placement. The patient is status post hysterectomy. No suspicious adnexal masses are seen. Other: No additional soft tissue abnormalities are seen. Musculoskeletal: No acute osseous abnormalities are identified. Multilevel vacuum phenomenon is noted along the lumbar spine, with endplate sclerotic change noted at the upper lumbar spine. The visualized musculature is unremarkable in appearance. IMPRESSION: 1. Mild acute diverticulitis at the distal descending colon, with mild soft tissue inflammation, inflamed diverticulum and trace associated free fluid. No evidence of perforation or abscess formation at this time. 2. Scattered diverticulosis along the descending and sigmoid colon. 3. Scattered aortic atherosclerosis. 4. Small hypodensities within the liver measure up to 8 mm in size, nonspecific in appearance but likely benign. 5. Mild left basilar airspace opacity likely reflects atelectasis. 6. Mild degenerative change along the lumbar spine. Electronically Signed   By: Roanna RaiderJeffery  Chang M.D.   On: 10/11/2016 06:34    EKG: Independently reviewed. None obtained in the ED  Assessment/Plan Principal Problem:   Diverticulitis Active Problems:   GERD (gastroesophageal reflux disease)   COPD (chronic obstructive pulmonary disease) (HCC)   Tobacco abuse   Hyperlipidemia LDL goal <70   Coronary artery disease involving native coronary artery of native heart without angina pectoris    Acute uncomplicated descending colon diverticulitis -Given multiple hospitalizations for the same, EDP thought it prudent for admission for IV antibiotics. -As she recently completed a course of Cipro and Flagyl 2 weeks ago, will elect to change antibiotics and place her on meropenem. Anaphylactic  reaction to penicillin precludes use of Zosyn. -Clear liquid diet. Advance as tolerated. -Given her multiple episodes at least 4-5, I think it is time she be referred as an outpatient to general surgery to discuss possible partial colectomy. This will probably have to be delayed for now given her recent cardiac stenting.  Hyperlipidemia -Continue statin  Coronary artery disease -Stable, no chest pain. -Continue aspirin, Plavix.  GERD -Continue PPI.   DVT prophylaxis: Lovenox  Code Status: Full code  Family Communication: Husband at bedside updated on plan of care and questions answered  Disposition Plan: Anticipate discharge home in 24-48 hours  Consults called: None  Admission status: Observation    Time Spent: 85 minutes  Chaya JanHERNANDEZ ACOSTA,ESTELA MD [facility] Pager [phone]  If 7PM-7AM, please contact night-coverage [email or web address] Password [email or web address]  10/11/2016, 11:57 AM

## 2016-10-11 NOTE — ED Notes (Signed)
Checked on patient for urine sample,patient stated can't go right now,will try back in 30 minutes.

## 2016-10-11 NOTE — Progress Notes (Signed)
Pharmacy Antibiotic Note  Lisa SimsCarolyn [name] is [name] 71 y.o. female admitted on 10/11/2016 with intra-abdominal infection not improved with Flagyl and Cipro.  Pharmacy has been consulted for MEROPENEM dosing.  Penicillin allergy noted.   Plan: Meropenem 1gm IV q8h Monitor labs, progress, c/s  Height: 5' 6.5" (168.9 cm) Weight: 173 lb 9.6 oz (78.7 kg) IBW/kg (Calculated) : 60.45  Temp (24hrs), Avg:98.5 F (36.9 C), Min:98.2 F (36.8 C), Max:98.7 F (37.1 C)   Recent Labs Lab 10/11/16 0437  WBC 10.5  CREATININE 0.66    Estimated Creatinine Clearance: 67 mL/min (by C-G formula based on SCr of 0.66 mg/dL).    Allergies  Allergen Reactions  . Brilinta [Ticagrelor] Shortness Of Breath  . Penicillins Anaphylaxis    Has patient had [name] PCN reaction causing immediate rash, facial/tongue/throat swelling, SOB or lightheadedness with hypotension: Yes Has patient had [name] PCN reaction causing severe rash involving mucus membranes or skin necrosis: yes Has patient had [name] PCN reaction that required hospitalization: no Has patient had [name] PCN reaction occurring within the last 10 years: no If all of the above answers are "NO", then may proceed with Cephalosporin use.   . Sulfa Antibiotics Other (See Comments)    Hands and feet blistered   Antimicrobials this admission: Meropenem 8/14 >>  Dose adjustments this admission:  Thank you for allowing pharmacy to be [name] part of this patient's care.  Lisa [name], Lisa [name] 10/11/2016 12:58 PM

## 2016-10-11 NOTE — ED Triage Notes (Signed)
Pt c/o left sided abdominal pain that started 2 hours pta; pt states she thinks it is her diverticulitis flaring up; pt denies any n/v/d

## 2016-10-11 NOTE — Care Management Obs Status (Signed)
MEDICARE OBSERVATION STATUS NOTIFICATION   Patient Details  Name: Lisa [name] MRN: [id number] Date of Birth: [birth year]   Medicare Observation Status Notification Given:  Yes    [name], Renee Erb Demske, RN 10/11/2016, 11:42 AM

## 2016-10-11 NOTE — Care Management Note (Signed)
Case Management Note  Patient Details  Name: Lisa [name] MRN: [id number] Date of Birth: December 27, 1945  Subjective/Objective:                  Pt admitted with diverticulitis. She is from home with spouse, ind with ADL's. She is wearing oxygen acutely, "due to getting too much pain medication". Has CPC, transportation and insurance with drug coverage. She has no HH or DME needs. She communicates no needs or concerns about DC.   Action/Plan: DC home with self care. Will need to wean from oxygen.   Expected Discharge Date:     10/12/2016            Expected Discharge Plan:  Home/Self Care  In-House Referral:  NA  Discharge planning Services  CM Consult  Post Acute Care Choice:  NA Choice offered to:  NA  Status of Service:  Completed, signed off  [name], [name], RN 10/11/2016, 1:12 PM

## 2016-10-12 DIAGNOSIS — I2510 Atherosclerotic heart disease of native coronary artery without angina pectoris: Secondary | ICD-10-CM | POA: Diagnosis not present

## 2016-10-12 DIAGNOSIS — K5792 Diverticulitis of intestine, part unspecified, without perforation or abscess without bleeding: Secondary | ICD-10-CM | POA: Diagnosis not present

## 2016-10-12 DIAGNOSIS — K219 Gastro-esophageal reflux disease without esophagitis: Secondary | ICD-10-CM | POA: Diagnosis not present

## 2016-10-12 DIAGNOSIS — K5732 Diverticulitis of large intestine without perforation or abscess without bleeding: Secondary | ICD-10-CM | POA: Diagnosis not present

## 2016-10-12 LAB — BASIC METABOLIC PANEL
Anion gap: 6 (ref 5–15)
BUN: 9 mg/dL (ref 6–20)
CHLORIDE: 106 mmol/L (ref 101–111)
CO2: 30 mmol/L (ref 22–32)
Calcium: 8.5 mg/dL — ABNORMAL LOW (ref 8.9–10.3)
Creatinine, Ser: 0.69 mg/dL (ref 0.44–1.00)
GFR calc Af Amer: >60 mL/min (ref >60)
GFR calc non Af Amer: >60 mL/min (ref >60)
GLUCOSE: 95 mg/dL (ref 65–99)
POTASSIUM: 3.8 mmol/L (ref 3.5–5.1)
Sodium: 142 mmol/L (ref 135–145)

## 2016-10-12 LAB — CBC
HEMATOCRIT: 41.3 % (ref 36.0–46.0)
Hemoglobin: 13.3 g/dL (ref 12.0–15.0)
MCH: 31.1 pg (ref 26.0–34.0)
MCHC: 32.2 g/dL (ref 30.0–36.0)
MCV: 96.7 fL (ref 78.0–100.0)
Platelets: 184 10*3/uL (ref 150–400)
RBC: 4.27 MIL/uL (ref 3.87–5.11)
RDW: 14.2 % (ref 11.5–15.5)
WBC: 8.3 10*3/uL (ref 4.0–10.5)

## 2016-10-12 MED ORDER — TRAMADOL HCL 50 MG PO TABS: 50.0000 mg | ORAL_TABLET | Freq: Four times a day (QID) | ORAL | 0 refills | Status: DC | PRN

## 2016-10-12 MED ORDER — METRONIDAZOLE 500 MG PO TABS: 500.0000 mg | ORAL_TABLET | Freq: Three times a day (TID) | ORAL | 0 refills | Status: AC

## 2016-10-12 MED ORDER — CIPROFLOXACIN HCL 500 MG PO TABS: 500.0000 mg | ORAL_TABLET | Freq: Two times a day (BID) | ORAL | 0 refills | Status: AC

## 2016-10-12 NOTE — Discharge Summary (Signed)
Physician Discharge Summary  Lisa SimsCarolyn Sanpedro ZOX:[id number] DOB: 11/07/1945 DOA: 10/11/2016  PCP: Kirstie PeriShah, Ashish, MD  Admit date: 10/11/2016 Discharge date: 10/12/2016  Admitted From: (Home Disposition:  (Home  Recommendations for Outpatient Follow-up:  1. Follow up with PCP in 1-2 weeks   Home Health:NO) Equipment/Devices: NO  Discharge Condition:Stable CODE STATUS:(FULL,  Diet recommendation: Heart Healthy  Brief/Interim Summary:   HPI: Lisa [name] is a 71 y.o. female with history significant for coronary artery disease status DE/stenting in July 2018, she has also had 2 episodes of diverticulitis IN THE PAST . Most recently about one month ago for which she completed a seven-day course of Cipro and Flagyl. She presents to the hospital with recurrent severe 10 out of 10 pain of her left lower quadrant Was found to have another episode of Mild acute diverticulitis at the distal descending colon by the CAT scan report, she was admitted on IV meropenem with pain control within 24 hours her symptoms improved I advised her to continue course for 14 days with oral Cipro and Flagyl follow-up with GI for colonoscopy, to rule out any other etiology behind her recurrent symptoms. The patient verbalized understanding and she will follow up with her own she said she will be discharged to follow-up with outpatient. Discharge Diagnoses:   Acute RECURRENT  uncomplicated descending colon diverticulitis Hyperlipidemia Coronary artery disease GERD   Discharge Instructions  Discharge Instructions    Diet - low sodium heart healthy    Complete by:  As directed    SOFT DIET.   Increase activity slowly    Complete by:  As directed      Allergies as of 10/12/2016      Reactions   Brilinta [ticagrelor] Shortness Of Breath   Penicillins Anaphylaxis   Has patient had a PCN reaction causing immediate rash, facial/tongue/throat swelling, SOB or lightheadedness with hypotension: Yes Has patient  had a PCN reaction causing severe rash involving mucus membranes or skin necrosis: yes Has patient had a PCN reaction that required hospitalization: no Has patient had a PCN reaction occurring within the last 10 years: no If all of the above answers are "NO", then may proceed with Cephalosporin use.   Sulfa Antibiotics Other (See Comments)   Hands and feet blistered      Medication List    TAKE these medications   acetaminophen 500 MG tablet Commonly known as:  TYLENOL Take 1,000 mg by mouth every 6 (six) hours as needed for moderate pain.   ADVAIR DISKUS 250-50 MCG/DOSE Aepb Generic drug:  Fluticasone-Salmeterol Inhale 1 puff into the lungs at bedtime as needed (for shortness of breath).   ALLERGY PO Take 1 tablet by mouth daily. Equate brand   aspirin 81 MG EC tablet Take 1 tablet (81 mg total) by mouth daily.   atorvastatin 80 MG tablet Commonly known as:  LIPITOR Take 1 tablet (80 mg total) by mouth daily at 6 PM.   Cinnamon 500 MG capsule Take 1,000 mg by mouth daily.   ciprofloxacin 500 MG tablet Commonly known as:  CIPRO Take 1 tablet (500 mg total) by mouth 2 (two) times daily.   clopidogrel 75 MG tablet Commonly known as:  PLAVIX Take 75 mg by mouth daily.   esomeprazole 40 MG capsule Commonly known as:  NEXIUM Take 40 mg by mouth daily.   FISH OIL OMEGA-3 PO Take 1 capsule by mouth daily.   metroNIDAZOLE 500 MG tablet Commonly known as:  FLAGYL Take 1 tablet (500 mg total)  by mouth 3 (three) times daily.   MOVE FREE PO Take 1 tablet by mouth daily.   nitroGLYCERIN 0.4 MG SL tablet Commonly known as:  NITROSTAT Place 1 tablet (0.4 mg total) under the tongue every 5 (five) minutes as needed for chest pain.   PROBIOTIC PO Take 1 capsule by mouth daily.   traMADol 50 MG tablet Commonly known as:  ULTRAM Take 1 tablet (50 mg total) by mouth every 6 (six) hours as needed.   VENTOLIN HFA 108 (90 Base) MCG/ACT inhaler Generic drug:   albuterol Inhale 2 puffs into the lungs every 6 (six) hours as needed for wheezing or shortness of breath.   vitamin B-12 500 MCG tablet Commonly known as:  CYANOCOBALAMIN Take 500 mcg by mouth daily.   vitamin C 500 MG tablet Commonly known as:  ASCORBIC ACID Take 500 mg by mouth daily.   Vitamin D3 2000 units Tabs Take 2,000 Units by mouth daily.       Allergies  Allergen Reactions  . Brilinta [Ticagrelor] Shortness Of Breath  . Penicillins Anaphylaxis    Has patient had a PCN reaction causing immediate rash, facial/tongue/throat swelling, SOB or lightheadedness with hypotension: Yes Has patient had a PCN reaction causing severe rash involving mucus membranes or skin necrosis: yes Has patient had a PCN reaction that required hospitalization: no Has patient had a PCN reaction occurring within the last 10 years: no If all of the above answers are "NO", then may proceed with Cephalosporin use.   . Sulfa Antibiotics Other (See Comments)    Hands and feet blistered    Consultations:  NONE      Procedures/Studies: Dg Chest 2 View  Result Date: 09/30/2016 CLINICAL DATA:  Chest pain since last night. EXAM: CHEST  2 VIEW COMPARISON:  09/15/2016 FINDINGS: Normal heart size. Linear and patchy densities at the left base compatible with atelectasis versus airspace disease. No pneumothorax or pleural effusion. IMPRESSION: Left basilar atelectasis versus airspace disease. Followup PA and lateral chest X-ray is recommended in 3-4 weeks following trial of antibiotic therapy to ensure resolution and exclude underlying malignancy. Electronically Signed   By: Jolaine Click M.D.   On: 09/30/2016 11:28   Dg Chest 2 View  Result Date: 09/15/2016 CLINICAL DATA:  Patient states cough and congestion since today. She was discharged from the hospital recently after 7 days post cardiac stent placement. Hx of COPD, coronary artery disease, NSTEMI, unstable angina, left heart cath, and former smoker.  EXAM: CHEST  2 VIEW COMPARISON:  08/31/2016 FINDINGS: The cardiac silhouette is normal in size and configuration. No mediastinal or hilar masses. No evidence of adenopathy. Lungs are clear.  No pleural effusion or pneumothorax. Skeletal structures are demineralized but intact. IMPRESSION: No active cardiopulmonary disease. Electronically Signed   By: Amie Portland M.D.   On: 09/15/2016 19:30   Ct Abdomen Pelvis W Contrast  Result Date: 10/11/2016 CLINICAL DATA:  Acute onset of left-sided abdominal pain. Initial encounter. EXAM: CT ABDOMEN AND PELVIS WITH CONTRAST TECHNIQUE: Multidetector CT imaging of the abdomen and pelvis was performed using the standard protocol following bolus administration of intravenous contrast. CONTRAST:  ISOVUE-300 IOPAMIDOL (ISOVUE-300) INJECTION 61% COMPARISON:  CT of the abdomen and pelvis performed 09/15/2016 FINDINGS: Lower chest: Mild left basilar opacity likely reflects atelectasis. The visualized portions of the mediastinum are unremarkable. Hepatobiliary: Scattered small hypodensities within the liver measure up to 8 mm in size, nonspecific in appearance but likely benign. The gallbladder is unremarkable in appearance. The  common bile duct remains normal in caliber. Pancreas: The pancreas is within normal limits. Spleen: The spleen is unremarkable in appearance. Adrenals/Urinary Tract: The adrenal glands are unremarkable in appearance. A small left renal cyst is noted. There is no evidence of hydronephrosis. No renal or ureteral stones are identified. No perinephric stranding is seen. Stomach/Bowel: The stomach is unremarkable in appearance. The small bowel is within normal limits. The appendix is normal in caliber, without evidence of appendicitis. The colon is unremarkable in appearance. Mild soft tissue inflammation is noted about an inflamed diverticulum at the distal descending colon, with trace associated free fluid, compatible with acute diverticulitis. There is  no evidence of perforation or abscess formation at this time. Scattered diverticulosis is noted along the descending and sigmoid colon. Vascular/Lymphatic: Scattered calcification is seen along the abdominal aorta and its branches. The abdominal aorta is otherwise grossly unremarkable. The inferior vena cava is grossly unremarkable. A retroaortic left renal vein is noted; part of the left renal vein empties into the left common iliac vein. No retroperitoneal lymphadenopathy is seen. No pelvic sidewall lymphadenopathy is identified. Reproductive: The bladder is mildly distended and grossly unremarkable. Trace air within the bladder may reflect recent Foley catheter placement. The patient is status post hysterectomy. No suspicious adnexal masses are seen. Other: No additional soft tissue abnormalities are seen. Musculoskeletal: No acute osseous abnormalities are identified. Multilevel vacuum phenomenon is noted along the lumbar spine, with endplate sclerotic change noted at the upper lumbar spine. The visualized musculature is unremarkable in appearance. IMPRESSION: 1. Mild acute diverticulitis at the distal descending colon, with mild soft tissue inflammation, inflamed diverticulum and trace associated free fluid. No evidence of perforation or abscess formation at this time. 2. Scattered diverticulosis along the descending and sigmoid colon. 3. Scattered aortic atherosclerosis. 4. Small hypodensities within the liver measure up to 8 mm in size, nonspecific in appearance but likely benign. 5. Mild left basilar airspace opacity likely reflects atelectasis. 6. Mild degenerative change along the lumbar spine. Electronically Signed   By: Roanna Raider M.D.   On: 10/11/2016 06:34   Ct Abdomen Pelvis W Contrast  Result Date: 09/15/2016 CLINICAL DATA:  Right lower quadrant pain. History of diverticulitis and hysterectomy. EXAM: CT ABDOMEN AND PELVIS WITH CONTRAST TECHNIQUE: Multidetector CT imaging of the abdomen and  pelvis was performed using the standard protocol following bolus administration of intravenous contrast. CONTRAST:  ISOVUE-300 IOPAMIDOL (ISOVUE-300) INJECTION 61% COMPARISON:  09/26/2009 CT FINDINGS: Lower chest: The included heart is normal in size without pericardial effusion. Dependent bibasilar atelectasis. 4 mm stable right middle lobe nodule. Hepatobiliary: Mild hepatic steatosis. Normal appearing gallbladder. No biliary dilatation. Pancreas: Normal Spleen: Normal Adrenals/Urinary Tract: Normal bilateral adrenal glands. 3 mm interpolar left renal calculus without obstruction. Simple appearing left interpolar renal cyst measuring 16 x 12 x 10 mm. No hydroureteronephrosis. Bladder is unremarkable. Stomach/Bowel: Acute sigmoid diverticulitis without bowel obstruction, free air or abscess. Pericolonic inflammation is noted along the mid sigmoid. Circular muscle hypertrophy and diverticulosis is otherwise noted of the distal descending through sigmoid colon. Stomach, small intestine and appendix are nonacute. Vascular/Lymphatic: Aortoiliac atherosclerosis without aneurysm. Retroaortic left renal vein. Reproductive: Status post hysterectomy. No adnexal masses. Other: No abdominal wall hernia or abnormality. No abdominopelvic ascites. Musculoskeletal: Degenerative disc disease from L1 through L5 consistent with lumbar spondylosis. No acute nor suspicious osseous lesions. IMPRESSION: 1. Acute uncomplicated sigmoid diverticulitis. 2. Nonobstructing 3 mm left interpolar renal calculus adjacent to a simple 16 x 12 x 10 mm simple  renal cyst. 3. Aortoiliac atherosclerosis. 4. Stable 4 mm right middle lobe noncalcified pulmonary nodule dating back to 2011 consistent with a benign finding. Electronically Signed   By: Tollie Eth M.D.   On: 09/15/2016 20:39    (Echo, Carotid, EGD, Colonoscopy, ERCP)    Subjective:   Discharge Exam: Vitals:   10/12/16 0603 10/12/16 0803  BP: 104/64   Pulse: 69   Resp: 16    Temp: 99.2 F (37.3 C)   SpO2:  94%   Vitals:   10/11/16 1942 10/11/16 2138 10/12/16 0603 10/12/16 0803  BP:  (!) 100/50 104/64   Pulse:  65 69   Resp:  15 16   Temp:  98.9 F (37.2 C) 99.2 F (37.3 C)   TempSrc:  Oral    SpO2: 95% 92%  94%  Weight:      Height:        General: Pt is alert, awake, not in acute distress Cardiovascular: RRR, S1/S2 +, no rubs, no gallops Respiratory: CTA bilaterally, no wheezing, no rhonchi Abdominal: Soft, NT, ND, bowel sounds + Extremities: no edema, no cyanosis    The results of significant diagnostics from this hospitalization (including imaging, microbiology, ancillary and laboratory) are listed below for reference.     Microbiology: No results found for this or any previous visit (from the past 240 hour(s)).   Labs: BNP (last 3 results)  Recent Labs  09/01/16 0024 09/30/16 1100  BNP 86.2 72.0   Basic Metabolic Panel:  Recent Labs Lab 10/11/16 0437 10/12/16 0609  NA 139 142  K 4.0 3.8  CL 107 106  CO2 25 30  GLUCOSE 104* 95  BUN 15 9  CREATININE 0.66 0.69  CALCIUM 8.8* 8.5*   Liver Function Tests:  Recent Labs Lab 10/11/16 0437  AST 20  ALT 22  ALKPHOS 111  BILITOT 0.5  PROT 7.2  ALBUMIN 3.7   No results for input(s): LIPASE, AMYLASE in the last 168 hours. No results for input(s): AMMONIA in the last 168 hours. CBC:  Recent Labs Lab 10/11/16 0437 10/12/16 0609  WBC 10.5 8.3  NEUTROABS 7.3  --   HGB 14.1 13.3  HCT 42.7 41.3  MCV 94.5 96.7  PLT 190 184   Cardiac Enzymes: No results for input(s): CKTOTAL, CKMB, CKMBINDEX, TROPONINI in the last 168 hours. BNP: Invalid input(s): POCBNP CBG: No results for input(s): GLUCAP in the last 168 hours. D-Dimer No results for input(s): DDIMER in the last 72 hours. Hgb A1c No results for input(s): HGBA1C in the last 72 hours. Lipid Profile No results for input(s): CHOL, HDL, LDLCALC, TRIG, CHOLHDL, LDLDIRECT in the last 72 hours. Thyroid function  studies No results for input(s): TSH, T4TOTAL, T3FREE, THYROIDAB in the last 72 hours.  Invalid input(s): FREET3 Anemia work up No results for input(s): VITAMINB12, FOLATE, FERRITIN, TIBC, IRON, RETICCTPCT in the last 72 hours. Urinalysis    Component Value Date/Time   COLORURINE YELLOW 09/15/2016 1657   APPEARANCEUR CLEAR 09/15/2016 1657   LABSPEC 1.020 09/15/2016 1657   PHURINE 6.0 09/15/2016 1657   GLUCOSEU NEGATIVE 09/15/2016 1657   HGBUR NEGATIVE 09/15/2016 1657   BILIRUBINUR NEGATIVE 09/15/2016 1657   KETONESUR NEGATIVE 09/15/2016 1657   PROTEINUR NEGATIVE 09/15/2016 1657   UROBILINOGEN 0.2 09/26/2009 1632   NITRITE NEGATIVE 09/15/2016 1657   LEUKOCYTESUR NEGATIVE 09/15/2016 1657   Sepsis Labs Invalid input(s): PROCALCITONIN,  WBC,  LACTICIDVEN Microbiology No results found for this or any previous visit (from the past 240 hour(s)).  Time coordinating discharge: Over 30 minutes  SIGNED:   Efrain Sella, MD  [facility] 10/12/2016, 11:49 AM Pager   If 7PM-7AM, please contact night-coverage [email or web address] Password [email or web address]

## 2016-10-12 NOTE — Progress Notes (Signed)
Patient IV removed, tolerated well. Patient and family given discharge instructions at bedside.  

## 2016-10-21 DIAGNOSIS — R3915 Urgency of urination: Secondary | ICD-10-CM | POA: Diagnosis not present

## 2016-10-21 DIAGNOSIS — Z713 Dietary counseling and surveillance: Secondary | ICD-10-CM | POA: Diagnosis not present

## 2016-10-21 DIAGNOSIS — J449 Chronic obstructive pulmonary disease, unspecified: Secondary | ICD-10-CM | POA: Diagnosis not present

## 2016-10-21 DIAGNOSIS — B373 Candidiasis of vulva and vagina: Secondary | ICD-10-CM | POA: Diagnosis not present

## 2016-10-21 DIAGNOSIS — Z6827 Body mass index (BMI) 27.0-27.9, adult: Secondary | ICD-10-CM | POA: Diagnosis not present

## 2016-10-21 DIAGNOSIS — E7800 Pure hypercholesterolemia, unspecified: Secondary | ICD-10-CM | POA: Diagnosis not present

## 2016-10-21 DIAGNOSIS — I2510 Atherosclerotic heart disease of native coronary artery without angina pectoris: Secondary | ICD-10-CM | POA: Diagnosis not present

## 2016-10-21 DIAGNOSIS — B3781 Candidal esophagitis: Secondary | ICD-10-CM | POA: Diagnosis not present

## 2016-10-21 DIAGNOSIS — Z299 Encounter for prophylactic measures, unspecified: Secondary | ICD-10-CM | POA: Diagnosis not present

## 2016-10-25 ENCOUNTER — Other Ambulatory Visit: Payer: MEDICARE

## 2016-10-25 ENCOUNTER — Other Ambulatory Visit (HOSPITAL_COMMUNITY)
Admission: RE | Admit: 2016-10-25 | Discharge: 2016-10-25 | Disposition: A | Discharge status: Home / Self Care | Payer: MEDICARE | Source: Ambulatory Visit | Attending: Internal Medicine | Admitting: Internal Medicine

## 2016-10-25 DIAGNOSIS — E785 Hyperlipidemia, unspecified: Secondary | ICD-10-CM | POA: Insufficient documentation

## 2016-10-25 LAB — HEPATIC FUNCTION PANEL
ALBUMIN: 3.9 g/dL (ref 3.5–5.0)
ALT: 18 U/L (ref 14–54)
AST: 22 U/L (ref 15–41)
Alkaline Phosphatase: 86 U/L (ref 38–126)
Bilirubin, Direct: 0.1 mg/dL (ref 0.1–0.5)
Indirect Bilirubin: 0.4 mg/dL (ref 0.3–0.9)
TOTAL PROTEIN: 7.4 g/dL (ref 6.5–8.1)
Total Bilirubin: 0.5 mg/dL (ref 0.3–1.2)

## 2016-10-25 LAB — LIPID PANEL
CHOL/HDL RATIO: 2.5 ratio
CHOLESTEROL: 76 mg/dL (ref 0–200)
HDL: 30 mg/dL — AB (ref >40)
LDL CALC: 30 mg/dL (ref 0–99)
TRIGLYCERIDES: 81 mg/dL (ref <150)
VLDL: 16 mg/dL (ref 0–40)

## 2016-11-04 ENCOUNTER — Encounter: Payer: Self-pay | Admitting: Adult Health

## 2016-11-04 ENCOUNTER — Ambulatory Visit (INDEPENDENT_AMBULATORY_CARE_PROVIDER_SITE_OTHER): Payer: MEDICARE | Admitting: Adult Health

## 2016-11-04 VITALS — BP 114/72 | HR 73 | Ht 66.5 in | Wt 175.0 lb

## 2016-11-04 DIAGNOSIS — I200 Unstable angina: Secondary | ICD-10-CM

## 2016-11-04 DIAGNOSIS — E7800 Pure hypercholesterolemia, unspecified: Secondary | ICD-10-CM

## 2016-11-04 DIAGNOSIS — I2510 Atherosclerotic heart disease of native coronary artery without angina pectoris: Secondary | ICD-10-CM | POA: Diagnosis not present

## 2016-11-04 NOTE — Progress Notes (Signed)
Cardiology Office Note   Date:  11/04/2016   ID:  Lisa SimsCarolyn [name], DOB Oct 13, 1945, MRN [id number]  PCP:  Kirstie PeriShah, Ashish, MD  Cardiologist:  Tenny Crawoss  Chief Complaint  Patient presents with  . Coronary Artery Disease     History of Present Illness: Lisa [name] is a 71 y.o. female who presents for posthospitalization follow-up after admission for recurrent descending colon diverticulitis,. She has a history of coronary artery disease with most recent hospitalization in July 2018 in the setting of a non-STEMI. She has a history of PCI to the circumflex and OM bifurcation with drug-eluting stent. I discharged from hospitalization on 10/12/2016 no medications were changed she was continued on dual antiplatelet therapy.  She comes today without any complaints. She denies recurrent dyspnea, chest pain, or fatigue. She is continued to be followed by primary care in GI for diverticulitis and recurrent UTIs. She is medically compliant.  Past Medical History:  Diagnosis Date  . COPD (chronic obstructive pulmonary disease) (HCC)   . Coronary artery disease   . Cough    " ALL MY LIFE "  . Dyspnea   . GERD (gastroesophageal reflux disease)   . NSTEMI (non-ST elevated myocardial infarction) (HCC) 09/01/2016   DES to Cx/OM bifurcation  . Unstable angina (HCC) 08/2016    Past Surgical History:  Procedure Laterality Date  . ABDOMINAL HYSTERECTOMY    . CORONARY STENT INTERVENTION  09/05/2016   Successful complex PCI of the circumflex/OM bifurcation using a Synergy DES  . CORONARY STENT INTERVENTION N/A 09/05/2016   Procedure: Coronary Stent Intervention;  Surgeon: Tonny Bollmanooper, Michael, MD;  [facility];  Service: Cardiovascular;  Laterality: N/A;  . LEFT HEART CATH AND CORONARY ANGIOGRAPHY N/A 09/02/2016   Procedure: Left Heart Cath and Coronary Angiography;  Surgeon: Corky CraftsVaranasi, Jayadeep S, MD;  [facility];  Service: Cardiovascular;  Laterality: N/A;     Current  Outpatient Prescriptions  Medication Sig Dispense Refill  . acetaminophen (TYLENOL) 500 MG tablet Take 1,000 mg by mouth every 6 (six) hours as needed for moderate pain.    Marland Kitchen. ADVAIR DISKUS 250-50 MCG/DOSE AEPB Inhale 1 puff into the lungs at bedtime as needed (for shortness of breath).     Marland Kitchen. aspirin EC 81 MG EC tablet Take 1 tablet (81 mg total) by mouth daily. 90 tablet 3  . atorvastatin (LIPITOR) 80 MG tablet Take 1 tablet (80 mg total) by mouth daily at 6 PM. 30 tablet 3  . Chlorpheniramine Maleate (ALLERGY PO) Take 1 tablet by mouth daily. Equate brand    . Cholecalciferol (VITAMIN D3) 2000 units TABS Take 2,000 Units by mouth daily.    . Cinnamon 500 MG capsule Take 1,000 mg by mouth daily.    . clopidogrel (PLAVIX) 75 MG tablet Take 75 mg by mouth daily.    Marland Kitchen. esomeprazole (NEXIUM) 40 MG capsule Take 40 mg by mouth daily.    . Glucosamine-Chondroitin (MOVE FREE PO) Take 1 tablet by mouth daily.    . nitroGLYCERIN (NITROSTAT) 0.4 MG SL tablet Place 1 tablet (0.4 mg total) under the tongue every 5 (five) minutes as needed for chest pain. 25 tablet 1  . Omega-3 Fatty Acids (FISH OIL OMEGA-3 PO) Take 1 capsule by mouth daily.    . Probiotic Product (PROBIOTIC PO) Take 1 capsule by mouth daily.    . traMADol (ULTRAM) 50 MG tablet Take 1 tablet (50 mg total) by mouth every 6 (six) hours as needed. 20 tablet 0  .  VENTOLIN HFA 108 (90 Base) MCG/ACT inhaler Inhale 2 puffs into the lungs every 6 (six) hours as needed for wheezing or shortness of breath.     . vitamin B-12 (CYANOCOBALAMIN) 500 MCG tablet Take 500 mcg by mouth daily.    . vitamin C (ASCORBIC ACID) 500 MG tablet Take 500 mg by mouth daily.     No current facility-administered medications for this visit.     Allergies:   Brilinta [ticagrelor]; Penicillins; and Sulfa antibiotics    Social History:  The patient  reports that she quit smoking about 2 months ago. Her smoking use included Cigarettes. She has a 25.00 pack-year smoking  history. She has never used smokeless tobacco. She reports that she drinks alcohol. She reports that she does not use drugs.   Family History:  The patient's family history includes Asthma in her mother; Cancer in her other; Heart attack (age of onset: 88) in her father; Heart attack (age of onset: 71) in her son.    ROS: All other systems are reviewed and negative. Unless otherwise mentioned in H&P    PHYSICAL EXAM: VS:  BP 114/72   Pulse 73   Ht 5' 6.5" (1.689 m)   Wt 175 lb (79.4 kg)   LMP 09/01/2016   SpO2 94%   BMI 27.82 kg/m  , BMI Body mass index is 27.82 kg/m. GEN: Well nourished, well developed, in no acute distress  HEENT: normal  Neck: no JVD, carotid bruits, or masses Cardiac: RRR; no murmurs, rubs, or gallops,no edema  Respiratory:  clear to auscultation bilaterally, normal work of breathing GI: soft, nontender, nondistended, + BS MS: no deformity or atrophy  Skin: warm and dry, no rash Neuro:  Strength and sensation are intact Psych: euthymic mood, full affect   Recent Labs: 09/01/2016: TSH 4.634 09/30/2016: B Natriuretic Peptide 72.0; Magnesium 1.9 10/12/2016: BUN 9; Creatinine, Ser 0.69; Hemoglobin 13.3; Platelets 184; Potassium 3.8; Sodium 142 10/25/2016: ALT 18    Lipid Panel    Component Value Date/Time   CHOL 76 10/25/2016 0818   TRIG 81 10/25/2016 0818   HDL 30 (L) 10/25/2016 0818   CHOLHDL 2.5 10/25/2016 0818   VLDL 16 10/25/2016 0818   LDLCALC 30 10/25/2016 0818      Wt Readings from Last 3 Encounters:  11/04/16 175 lb (79.4 kg)  10/11/16 173 lb 9.6 oz (78.7 kg)  09/30/16 172 lb 1.6 oz (78.1 kg)      Other studies Reviewed: Echocardiogram 2016-09-23 Left ventricle: The cavity size was normal. Wall thickness was   increased in a pattern of mild LVH. Systolic function was normal.   The estimated ejection fraction was in the range of 60% to 65%.   Doppler parameters are consistent with abnormal left ventricular   relaxation (grade 1 diastolic  dysfunction). - Mitral valve: There was mild regurgitation. - Pulmonary arteries: PA peak pressure: 37 mm Hg (S).  Cardiac Cath Conclusion     Prox RCA to Mid RCA lesion, 25 %stenosed.  Ost 2nd Mrg to 2nd Mrg lesion, 90 %stenosed. Dist Cx lesion, 90 %stenosed. Complex bifurcation lesion.  The left ventricular systolic function is normal.  LV end diastolic pressure is normal.  The left ventricular ejection fraction is 55-65% by visual estimate.  There is no aortic valve stenosis.   PCI Data 09/05/2016  Successful complex PCI of the circumflex/OM bifurcation using a Synergy DES  Recommend: ASA/Brilinta at least 12 months. Should be ok for discharge tomorrow if no complications arise.  ASSESSMENT AND PLAN:  1. Coronary artery disease: The patient offers no complaints of cardiac symptoms. She remains active. She is medically compliant. We'll continue her on her current medication regimen.  2. Hypercholesterolemia: Recent labs completed this month, total cholesterol 76, HDL 30, LDL 30, triglycerides 81, VLDL 16. Per Dr. Charlott Rakes note, the patient will be taken off of high-dose atorvastatin. Follow-up labs will be completed in 6 months.  3. Chronic diverticulitis: Patient was recently hospitalized and is continued to recover. She has been on multiple antibiotics, and has had treatment for UTI and vaginal yeast infection as well as. She will continue to follow with GI and primary care.   Current medicines are reviewed at length with the patient today.    Labs/ tests ordered today include:  Lisa [name], Lisa [name], AACC   11/04/2016 1:12 PM    [facility] [address] [place], [place] [address] Phone: [phone]; Fax: [phone]

## 2016-11-04 NOTE — Patient Instructions (Signed)
Medication Instructions:  Your physician has recommended you make the following change in your medication: STOP Taking Atorvastatin   Labwork: NONE  Testing/Procedures: NONE  Follow-Up: Your physician wants you to follow-up in: 6 Months with Dr. Tenny Crawoss. You will receive a reminder letter in the mail two months in advance. If you don't receive a letter, please call our office to schedule the follow-up appointment.   Any Other Special Instructions Will Be Listed Below (If Applicable).     If you need a refill on your cardiac medications before your next appointment, please call your pharmacy. Thank you for choosing [facility]!

## 2016-11-24 ENCOUNTER — Encounter (HOSPITAL_COMMUNITY): Payer: Self-pay | Admitting: *Deleted

## 2016-11-24 ENCOUNTER — Emergency Department (HOSPITAL_COMMUNITY): Payer: MEDICARE

## 2016-11-24 ENCOUNTER — Observation Stay (HOSPITAL_COMMUNITY)
Admission: EM | Admit: 2016-11-24 | Discharge: 2016-11-25 | Disposition: A | Discharge status: Home / Self Care | Payer: MEDICARE | Attending: Internal Medicine | Admitting: Internal Medicine

## 2016-11-24 DIAGNOSIS — Z720 Tobacco use: Secondary | ICD-10-CM | POA: Diagnosis present

## 2016-11-24 DIAGNOSIS — Z79899 Other long term (current) drug therapy: Secondary | ICD-10-CM | POA: Diagnosis not present

## 2016-11-24 DIAGNOSIS — I252 Old myocardial infarction: Secondary | ICD-10-CM

## 2016-11-24 DIAGNOSIS — I208 Other forms of angina pectoris: Secondary | ICD-10-CM

## 2016-11-24 DIAGNOSIS — E782 Mixed hyperlipidemia: Secondary | ICD-10-CM | POA: Diagnosis not present

## 2016-11-24 DIAGNOSIS — H409 Unspecified glaucoma: Secondary | ICD-10-CM | POA: Diagnosis not present

## 2016-11-24 DIAGNOSIS — Z87891 Personal history of nicotine dependence: Secondary | ICD-10-CM | POA: Diagnosis not present

## 2016-11-24 DIAGNOSIS — E785 Hyperlipidemia, unspecified: Secondary | ICD-10-CM | POA: Diagnosis present

## 2016-11-24 DIAGNOSIS — Z7901 Long term (current) use of anticoagulants: Secondary | ICD-10-CM | POA: Insufficient documentation

## 2016-11-24 DIAGNOSIS — R0989 Other specified symptoms and signs involving the circulatory and respiratory systems: Secondary | ICD-10-CM

## 2016-11-24 DIAGNOSIS — R600 Localized edema: Secondary | ICD-10-CM

## 2016-11-24 DIAGNOSIS — I10 Essential (primary) hypertension: Secondary | ICD-10-CM | POA: Diagnosis not present

## 2016-11-24 DIAGNOSIS — I200 Unstable angina: Secondary | ICD-10-CM

## 2016-11-24 DIAGNOSIS — R079 Chest pain, unspecified: Secondary | ICD-10-CM | POA: Diagnosis present

## 2016-11-24 DIAGNOSIS — K219 Gastro-esophageal reflux disease without esophagitis: Secondary | ICD-10-CM | POA: Insufficient documentation

## 2016-11-24 DIAGNOSIS — Z955 Presence of coronary angioplasty implant and graft: Secondary | ICD-10-CM

## 2016-11-24 DIAGNOSIS — I25110 Atherosclerotic heart disease of native coronary artery with unstable angina pectoris: Principal | ICD-10-CM | POA: Insufficient documentation

## 2016-11-24 DIAGNOSIS — I25118 Atherosclerotic heart disease of native coronary artery with other forms of angina pectoris: Secondary | ICD-10-CM | POA: Diagnosis not present

## 2016-11-24 DIAGNOSIS — Z23 Encounter for immunization: Secondary | ICD-10-CM | POA: Insufficient documentation

## 2016-11-24 DIAGNOSIS — J439 Emphysema, unspecified: Secondary | ICD-10-CM

## 2016-11-24 DIAGNOSIS — J449 Chronic obstructive pulmonary disease, unspecified: Secondary | ICD-10-CM | POA: Diagnosis not present

## 2016-11-24 LAB — BASIC METABOLIC PANEL
ANION GAP: 9 (ref 5–15)
BUN: 10 mg/dL (ref 6–20)
CALCIUM: 9 mg/dL (ref 8.9–10.3)
CO2: 25 mmol/L (ref 22–32)
Chloride: 107 mmol/L (ref 101–111)
Creatinine, Ser: 0.73 mg/dL (ref 0.44–1.00)
GFR calc Af Amer: >60 mL/min (ref >60)
Glucose, Bld: 112 mg/dL — ABNORMAL HIGH (ref 65–99)
Potassium: 3.7 mmol/L (ref 3.5–5.1)
Sodium: 141 mmol/L (ref 135–145)

## 2016-11-24 LAB — TROPONIN I

## 2016-11-24 LAB — CBC WITH DIFFERENTIAL/PLATELET
BASOS ABS: 0 10*3/uL (ref 0.0–0.1)
BASOS PCT: 1 %
Eosinophils Absolute: 0.2 10*3/uL (ref 0.0–0.7)
Eosinophils Relative: 2 %
HEMATOCRIT: 43.4 % (ref 36.0–46.0)
HEMOGLOBIN: 14.1 g/dL (ref 12.0–15.0)
Lymphocytes Relative: 28 %
Lymphs Abs: 1.9 10*3/uL (ref 0.7–4.0)
MCH: 31.5 pg (ref 26.0–34.0)
MCHC: 32.5 g/dL (ref 30.0–36.0)
MCV: 96.9 fL (ref 78.0–100.0)
Monocytes Absolute: 0.5 10*3/uL (ref 0.1–1.0)
Monocytes Relative: 7 %
NEUTROS ABS: 4.4 10*3/uL (ref 1.7–7.7)
Neutrophils Relative %: 62 %
Platelets: 237 10*3/uL (ref 150–400)
RBC: 4.48 MIL/uL (ref 3.87–5.11)
RDW: 14.3 % (ref 11.5–15.5)
WBC: 6.9 10*3/uL (ref 4.0–10.5)

## 2016-11-24 MED ORDER — GI COCKTAIL ~~LOC~~: 30.0000 mL | Freq: Four times a day (QID) | ORAL | Status: DC | PRN

## 2016-11-24 MED ORDER — ATORVASTATIN CALCIUM 40 MG PO TABS: 40.0000 mg | ORAL_TABLET | Freq: Every day | ORAL | Status: DC

## 2016-11-24 MED ORDER — CLOPIDOGREL BISULFATE 75 MG PO TABS
75.0000 mg | ORAL_TABLET | Freq: Every day | ORAL | Status: DC
  Administered 2016-11-25: 75 mg via ORAL
  Filled 2016-11-24: qty 1

## 2016-11-24 MED ORDER — ONDANSETRON HCL 4 MG/2ML IJ SOLN: 4.0000 mg | Freq: Four times a day (QID) | INTRAMUSCULAR | Status: DC | PRN

## 2016-11-24 MED ORDER — NITROGLYCERIN IN D5W 200-5 MCG/ML-% IV SOLN
5.0000 ug/min | INTRAVENOUS | Status: DC
  Administered 2016-11-24: 5 ug/min via INTRAVENOUS
  Filled 2016-11-24: qty 250

## 2016-11-24 MED ORDER — ASPIRIN EC 81 MG PO TBEC
81.0000 mg | DELAYED_RELEASE_TABLET | Freq: Every day | ORAL | Status: DC
  Administered 2016-11-25: 81 mg via ORAL
  Filled 2016-11-24: qty 1

## 2016-11-24 MED ORDER — ENOXAPARIN SODIUM 40 MG/0.4ML ~~LOC~~ SOLN
40.0000 mg | SUBCUTANEOUS | Status: DC
  Administered 2016-11-24: 40 mg via SUBCUTANEOUS
  Filled 2016-11-24: qty 0.4

## 2016-11-24 MED ORDER — MOMETASONE FURO-FORMOTEROL FUM 200-5 MCG/ACT IN AERO
2.0000 | INHALATION_SPRAY | Freq: Two times a day (BID) | RESPIRATORY_TRACT | Status: DC
  Administered 2016-11-24 – 2016-11-25 (×2): 2 via RESPIRATORY_TRACT
  Filled 2016-11-24 (×2): qty 8.8

## 2016-11-24 MED ORDER — MORPHINE SULFATE (PF) 2 MG/ML IV SOLN
2.0000 mg | INTRAVENOUS | Status: DC | PRN
Start: 2016-11-24 — End: 2016-11-25

## 2016-11-24 MED ORDER — ACETAMINOPHEN 325 MG PO TABS: 650.0000 mg | ORAL_TABLET | ORAL | Status: DC | PRN

## 2016-11-24 MED ORDER — ASPIRIN 81 MG PO CHEW
243.0000 mg | CHEWABLE_TABLET | Freq: Once | ORAL | Status: AC
  Administered 2016-11-24: 243 mg via ORAL
  Filled 2016-11-24: qty 3

## 2016-11-24 MED ORDER — INFLUENZA VAC SPLIT HIGH-DOSE 0.5 ML IM SUSY
0.5000 mL | PREFILLED_SYRINGE | INTRAMUSCULAR | Status: AC
  Administered 2016-11-25: 0.5 mL via INTRAMUSCULAR
  Filled 2016-11-24: qty 0.5

## 2016-11-24 MED ORDER — ATORVASTATIN CALCIUM 80 MG PO TABS: 80.0000 mg | ORAL_TABLET | Freq: Every day | ORAL | Status: DC

## 2016-11-24 MED ORDER — ACETAMINOPHEN 500 MG PO TABS: 1000.0000 mg | ORAL_TABLET | Freq: Four times a day (QID) | ORAL | Status: DC | PRN

## 2016-11-24 NOTE — ED Notes (Signed)
Report given to [facility], RN 300 at this time.

## 2016-11-24 NOTE — H&P (Signed)
History and Physical  Lisa [name] ZOX:[id number] DOB: 09/03/1945 DOA: 11/24/2016  Referring physician: Doug Sou, ER MD  PCP: Kirstie Peri, MD  Outpatient Specialists: [facility] heartcare Patient coming from: Home & is able to ambulate without assistance  Chief Complaint: Chest discomfort   HPI: Lisa [name] is a 71 y.o. female with medical history significant for CAD status post drug eluding stent placement in June as well as ongoing tobacco abuse who was in her usual state of health when she woke up early this morning complaining of chest discomfort and she described this as a heavy pressure-like sensation across the anterior aspect of her chest radiating all the way to her back. No associated dizziness, nausea or vomiting or shortness of breath. Symptoms persisted regardless of activity or rest. No relief with positioning or with taking aspirin. She came into the emergency room for further evaluation. ED Course: In the emergency room, EKG, enzymes and other lab work unremarkable. Patient given morphine and put on nitroglycerin drip. Pain eventually subsided after several hours. Hospitalists were called for further evaluation and admission.  Review of Systems: Patient seen in the ER . Patient denies any current complaints  Pt denies any headache, vision changes, dysphagia, chest pain currently, palpitations, shortness of breath, wheeze, cough, abdominal pain, hematuria, dysuria, constipation, diarrhea, focal extremity numbness weakness or pain  .  Review of systems are otherwise negative   Past Medical History:  Diagnosis Date  . COPD (chronic obstructive pulmonary disease) (HCC)   . Coronary artery disease   . Cough    " ALL MY LIFE "  . Dyspnea   . GERD (gastroesophageal reflux disease)   . NSTEMI (non-ST elevated myocardial infarction) (HCC) 09/01/2016   DES to Cx/OM bifurcation  . Unstable angina (HCC) 08/2016   Past Surgical History:  Procedure Laterality Date  .  ABDOMINAL HYSTERECTOMY    . CORONARY STENT INTERVENTION  09/05/2016   Successful complex PCI of the circumflex/OM bifurcation using a Synergy DES  . CORONARY STENT INTERVENTION N/A 09/05/2016   Procedure: Coronary Stent Intervention;  Surgeon: Tonny Bollman, MD;  Location: [facility] INVASIVE CV LAB;  Service: Cardiovascular;  Laterality: N/A;  . LEFT HEART CATH AND CORONARY ANGIOGRAPHY N/A 09/02/2016   Procedure: Left Heart Cath and Coronary Angiography;  Surgeon: Corky Crafts, MD;  Location: [facility] INVASIVE CV LAB;  Service: Cardiovascular;  Laterality: N/A;    Social History:  reports that she quit smoking about 2 months ago. Her smoking use included Cigarettes. She has a 25.00 pack-year smoking history. She has never used smokeless tobacco. She reports that she drinks alcohol. She reports that she does not use drugs. Ambulates without assistance   Allergies  Allergen Reactions  . Brilinta [Ticagrelor] Shortness Of Breath  . Penicillins Anaphylaxis    Has patient had a PCN reaction causing immediate rash, facial/tongue/throat swelling, SOB or lightheadedness with hypotension: Yes Has patient had a PCN reaction causing severe rash involving mucus membranes or skin necrosis: yes Has patient had a PCN reaction that required hospitalization: no Has patient had a PCN reaction occurring within the last 10 years: no If all of the above answers are "NO", then may proceed with Cephalosporin use.   . Sulfa Antibiotics Other (See Comments)    Hands and feet blistered    Family History  Problem Relation Age of Onset  . Asthma Mother   . Heart attack Father 74  . Cancer Other   . Heart attack Son 32  Prior to Admission medications   Medication Sig Start Date End Date Taking? Authorizing Provider  acetaminophen (TYLENOL) 500 MG tablet Take 1,000 mg by mouth every 6 (six) hours as needed for moderate pain.   Yes [provider]  ADVAIR DISKUS 250-50 MCG/DOSE AEPB Inhale 1 puff into  the lungs at bedtime as needed (for shortness of breath).  08/11/16  Yes [provider]  aspirin EC 81 MG EC tablet Take 1 tablet (81 mg total) by mouth daily. 09/07/16  Yes Duke, Roe Rutherford, PA  Chlorpheniramine Maleate (ALLERGY PO) Take 1 tablet by mouth daily. Equate brand   Yes [provider]  Cholecalciferol (VITAMIN D3) 2000 units TABS Take 2,000 Units by mouth daily.   Yes [provider]  Cinnamon 500 MG capsule Take 1,000 mg by mouth daily.   Yes [provider]  clopidogrel (PLAVIX) 75 MG tablet Take 75 mg by mouth daily.   Yes [provider]  esomeprazole (NEXIUM) 40 MG capsule Take 40 mg by mouth daily. 08/17/16  Yes [provider]  Glucosamine-Chondroitin (MOVE FREE PO) Take 1 tablet by mouth daily.   Yes [provider]  nitroGLYCERIN (NITROSTAT) 0.4 MG SL tablet Place 1 tablet (0.4 mg total) under the tongue every 5 (five) minutes as needed for chest pain. 09/06/16  Yes Duke, Roe Rutherford, PA  Omega-3 Fatty Acids (FISH OIL OMEGA-3 PO) Take 1 capsule by mouth daily.   Yes [provider]  Probiotic Product (PROBIOTIC PO) Take 1 capsule by mouth daily.   Yes [provider]  traMADol (ULTRAM) 50 MG tablet Take 1 tablet (50 mg total) by mouth every 6 (six) hours as needed. 10/12/16 10/12/17 Yes Efrain Sella, MD  VENTOLIN HFA 108 (90 Base) MCG/ACT inhaler Inhale 2 puffs into the lungs every 6 (six) hours as needed for wheezing or shortness of breath.  08/17/16  Yes [provider]  vitamin B-12 (CYANOCOBALAMIN) 500 MCG tablet Take 500 mcg by mouth daily.   Yes [provider]  vitamin C (ASCORBIC ACID) 500 MG tablet Take 500 mg by mouth daily.   Yes [provider]    Physical Exam: BP 107/67   Pulse 62   Temp 97.9 F (36.6 C)   Resp 18   Ht 5' 0.5" (1.537 m)   Wt 78.9 kg (174 lb)   LMP 09/01/2016   SpO2 92%   BMI 33.42 kg/m   General:  Alert & oriented x 3, NAD  Eyes:  Sclera nonicteric, extra ocular movements intact  ENT: Normocephalic, atraumatic, mucous membranes are moist  Neck: Supple. No JVD  Cardiovascular: RRR S1S2, II/VI SEM   Respiratory: CTA bilaterally  Abdomen: Soft, NT, non-distended, +BS  Skin: No skin breaks, tears or lesions  Musculoskeletal: No clubbing, cyanosis or edema  Psychiatric: Appropriate, no Evidence of psychoses  Neurologic: No focal deficits           Labs on Admission:  Basic Metabolic Panel:  Recent Labs Lab 11/24/16 1245  NA 141  K 3.7  CL 107  CO2 25  GLUCOSE 112*  BUN 10  CREATININE 0.73  CALCIUM 9.0   Liver Function Tests: No results for input(s): AST, ALT, ALKPHOS, BILITOT, PROT, ALBUMIN in the last 168 hours. No results for input(s): LIPASE, AMYLASE in the last 168 hours. No results for input(s): AMMONIA in the last 168 hours. CBC:  Recent Labs Lab 11/24/16 1245  WBC 6.9  NEUTROABS 4.4  HGB 14.1  HCT 43.4  MCV 96.9  PLT 237   Cardiac Enzymes:  Recent Labs Lab 11/24/16 1245  TROPONINI <0.03    BNP (last 3 results)  Recent Labs  09/01/16 0024 09/30/16 1100  BNP 86.2 72.0    ProBNP (last 3 results) No results for input(s): PROBNP in the last 8760 hours.  CBG: No results for input(s): GLUCAP in the last 168 hours.  Radiological Exams on Admission: Dg Chest Port 1 View  Result Date: 11/24/2016 CLINICAL DATA:  Chest pain EXAM: PORTABLE CHEST 1 VIEW COMPARISON:  Chest radiograph 09/30/2016 FINDINGS: Normal cardiomediastinal contours. Hazy interstitial opacities bilaterally no focal consolidation. No pleural effusion or pneumothorax. Bibasilar atelectasis. IMPRESSION: Bibasilar atelectasis and mild pulmonary vascular congestion. Electronically Signed   By: Deatra Robinson M.D.   On: 11/24/2016 13:38    EKG: Independently reviewed. Sinus rhythm, low voltage   Assessment/Plan Present on Admission:  . GERD (gastroesophageal reflux disease): Continue PPI  . COPD (chronic  obstructive pulmonary disease) (HCC): Stable  . Tobacco abuse: Counseled patient. She says she quit and then recently restarted. Declines nicotine patch.  . Coronary artery disease involving native coronary artery of native heart with unstable angina pectoris (HCC) With unstable angina: Cycle enzymes. Cardiology following. At this time, no acute evidence of ACS. We'll continue to follow.  . Hyperlipidemia LDL goal <70: Intolerant of statin.  . Glaucoma: Continue drops  Principal Problem:   Chest pain Active Problems:   Unstable angina (HCC)   GERD (gastroesophageal reflux disease)   COPD (chronic obstructive pulmonary disease) (HCC)   Tobacco abuse   Coronary artery disease involving native coronary artery of native heart with unstable angina pectoris (HCC)   Hyperlipidemia LDL goal <70   Glaucoma   DVT prophylaxis: lovenox   Code Status: Limited Code, (no intubation)   Family Communication: Husband at the bedside  Disposition Plan: If enzymes clear, likely DC in am  Consults called: Cardiology   Admission status: Given anticipation of DC tomorrow, place under observation     Hollice Espy MD [facility] Pager [phone]  If 7PM-7AM, please contact night-coverage [email or web address] Password [facility]  11/24/2016, 4:54 PM

## 2016-11-24 NOTE — ED Triage Notes (Signed)
Chest pain relieved by NTG

## 2016-11-24 NOTE — Consult Note (Signed)
Cardiology Consultation:   Patient ID: Lisa [name]; [id number]; Mar 23, 1945   Admit date: 11/24/2016 Date of Consult: 11/24/2016  Primary Care Provider: Kirstie Peri, MD Primary Cardiologist: Lisa [name]   Patient Profile:   Lisa [name] is a 71 y.o. female with a hx of CAD and PCI who is being seen today for the evaluation of chest pain at the request of Lisa [name].  History of Present Illness:   Lisa [name] is a 71 yr old woman with a history of complex PCI of the circumflex/OM bifurcation with a drug eluting stent on 09/05/16. The proximal to mid RCA had a 25% stenosis.  Of note, pre-PCI ECG was normal at that time.  She was initially started on Brilinta but then developed persistent shortness of breath and it was switched to Plavix.  Echocardiogram on 09/02/16 demonstrated normal left ventricular systolic function, LVEF 60-65%, with grade 1 diastolic dysfunction, mild LVH, and mild mitral regurgitation.  She was readmitted with chest pain in early August but troponins and ECG were unremarkable.  She was subsequently hospitalized 2 weeks later with diverticulitis.  She saw Lisa [name] in the office on 11/04/16 and was doing well.  Early this morning at 3:30 am she felt restless. She woke up, had some coffee, watched some TV, and fell back asleep.   She then awoke at around 8 am and had retrosternal chest pain radiating to the back. She took one SL nitro which did not alleviate her symptoms. She took a second nitro and there was some relief of pain but it was incomplete. She took a third nitro on the way to the ED but there was still incomplete relief of the pain.  In the ED she was initially started on a nitroglycerin infusion and she had pain relief. It has subsequently been discontinued and she is free of symptoms altogether.  She had some leg and feet swelling bilaterally yesterday but when she awoke this morning she had none.  She denies associated shortness of  breath and palpitations.  ECG performed today showed sinus rhythm and is without ischemic ST-T abnormalities, nor are there any arrhythmias.  Initial troponin is normal.     Past Medical History:  Diagnosis Date  . COPD (chronic obstructive pulmonary disease) (HCC)   . Coronary artery disease   . Cough    " ALL MY LIFE "  . Dyspnea   . GERD (gastroesophageal reflux disease)   . NSTEMI (non-ST elevated myocardial infarction) (HCC) 09/01/2016   DES to Cx/OM bifurcation  . Unstable angina (HCC) 08/2016    Past Surgical History:  Procedure Laterality Date  . ABDOMINAL HYSTERECTOMY    . CORONARY STENT INTERVENTION  09/05/2016   Successful complex PCI of the circumflex/OM bifurcation using a Synergy DES  . CORONARY STENT INTERVENTION N/A 09/05/2016   Procedure: Coronary Stent Intervention;  Surgeon: Lisa Bollman, MD;  Location: [facility] INVASIVE CV LAB;  Service: Cardiovascular;  Laterality: N/A;  . LEFT HEART CATH AND CORONARY ANGIOGRAPHY N/A 09/02/2016   Procedure: Left Heart Cath and Coronary Angiography;  Surgeon: Lisa Crafts, MD;  Location: [facility] INVASIVE CV LAB;  Service: Cardiovascular;  Laterality: N/A;       Inpatient Medications: Scheduled Meds: . [START ON 11/25/2016] aspirin  81 mg Oral Daily  . [START ON 11/25/2016] clopidogrel  75 mg Oral Daily  . mometasone-formoterol  2 puff Inhalation BID   Continuous Infusions: . nitroGLYCERIN Stopped (11/24/16 1624)   PRN Meds:   Allergies:  Allergies  Allergen Reactions  . Brilinta [Ticagrelor] Shortness Of Breath  . Penicillins Anaphylaxis    Has patient had a PCN reaction causing immediate rash, facial/tongue/throat swelling, SOB or lightheadedness with hypotension: Yes Has patient had a PCN reaction causing severe rash involving mucus membranes or skin necrosis: yes Has patient had a PCN reaction that required hospitalization: no Has patient had a PCN reaction occurring within the last 10 years: no If all of the  above answers are "NO", then may proceed with Cephalosporin use.   . Sulfa Antibiotics Other (See Comments)    Hands and feet blistered    Social History:   Social History   Social History  . Marital status: Divorced    Spouse name: N/A  . Number of children: N/A  . Years of education: N/A   Occupational History  . Not on file.   Social History Main Topics  . Smoking status: Former Smoker    Packs/day: 0.50    Years: 50.00    Types: Cigarettes    Quit date: 08/31/2016  . Smokeless tobacco: Never Used  . Alcohol use Yes     Comment: occas  . Drug use: No  . Sexual activity: Yes   Other Topics Concern  . Not on file   Social History Narrative  . No narrative on file    Family History:    Family History  Problem Relation Age of Onset  . Asthma Mother   . Heart attack Father 38  . Cancer Other   . Heart attack Son 48     ROS:  Please see the history of present illness.  ROS  All other ROS reviewed and negative.     Physical Exam/Data:   Vitals:   11/24/16 1600 11/24/16 1615 11/24/16 1645 11/24/16 1700  BP: 107/67 105/72 106/80 120/65  Pulse: 62 61  (!) 59  Resp: Temp:      SpO2: 92% 93%  92%  Weight:      Height:        Intake/Output Summary (Last 24 hours) at 11/24/16 1716 Last data filed at 11/24/16 1624  Gross per 24 hour  Intake             4.43 ml  Output                0 ml  Net             4.43 ml   Filed Weights   11/24/16 1231  Weight: 174 lb (78.9 kg)   Body mass index is 33.42 kg/m.  General:  Well nourished, well developed, in no acute distress HEENT: normal Lymph: no adenopathy Neck: no JVD Endocrine:  No thryomegaly Vascular: No carotid bruits; Cardiac:  normal S1, S2; RRR; no murmur Lungs:  clear to auscultation bilaterally, no wheezing, rhonchi or rales  Abd: soft, nontender, no hepatomegaly  Ext: no edema Musculoskeletal:  No deformities, BUE and BLE strength normal and equal Skin: warm and dry  Neuro: no  focal abnormalities noted Psych:  Normal affect    Relevant CV Studies: Echocardiogram reviewed above in detail.  Laboratory Data:  Chemistry Recent Labs Lab 11/24/16 1245  NA 141  K 3.7  CL 107  CO2 25  GLUCOSE 112*  BUN 10  CREATININE 0.73  CALCIUM 9.0  GFRNONAA >60  GFRAA >60  ANIONGAP 9    No results for input(s): PROT, ALBUMIN, AST, ALT, ALKPHOS, BILITOT in the last 168  hours. Hematology Recent Labs Lab 11/24/16 1245  WBC 6.9  RBC 4.48  HGB 14.1  HCT 43.4  MCV 96.9  MCH 31.5  MCHC 32.5  RDW 14.3  PLT 237   Cardiac Enzymes Recent Labs Lab 11/24/16 1245  TROPONINI <0.03   No results for input(s): TROPIPOC in the last 168 hours.  BNPNo results for input(s): BNP, PROBNP in the last 168 hours.  DDimer No results for input(s): DDIMER in the last 168 hours.  Radiology/Studies:  Dg Chest Port 1 View  Result Date: 11/24/2016 CLINICAL DATA:  Chest pain EXAM: PORTABLE CHEST 1 VIEW COMPARISON:  Chest radiograph 09/30/2016 FINDINGS: Normal cardiomediastinal contours. Hazy interstitial opacities bilaterally no focal consolidation. No pleural effusion or pneumothorax. Bibasilar atelectasis. IMPRESSION: Bibasilar atelectasis and mild pulmonary vascular congestion. Electronically Signed   By: Deatra Robinson M.D.   On: 11/24/2016 13:38    Assessment and Plan:   1. Chest pain in the context of CAD and complex PCI in July 2018: Symptoms have resolved. ECG is without ischemic changes but pre-PCI ECG was also normal. That being said, I would expect ST elevations with stent thrombosis so I do not think this is the case. Initial troponin is normal. I would recommend checking serial troponins to rule out ACS. Continue ASA and Plavix (switched from Brilinta to Plavix on 10/06/16). I will resume statin therapy with Lipitor 40 mg. 2. Hypertension: Elevated earlier, currently normal. Continue to monitor. 3. Pulmonary vascular congestion and bilateral leg edema: At present, she is  without lower extremity edema which appeared to resolve spontaneously by this morning. Her lungs are clear on exam. If either signs recur, I would repeat echocardiography to assess for an interval change in cardiac function, but not at this time. 4. Hyperlipidemia: I will resume Lipitor 40 mg. LDL 30 on 10/25/16.   For questions or updates, please contact [facility] Please consult [email or web address] for contact info under Cardiology/STEMI.   Signed, Prentice Docker, MD  11/24/2016 5:16 PM

## 2016-11-24 NOTE — ED Provider Notes (Signed)
[facility]Y DEPT Provider Note   CSN: [id number] Arrival date & time: 11/24/16  1220     History   Chief Complaint Chief Complaint  Patient presents with  . Chest Pain    HPI Lisa [name] is a 71 y.o. female.Complains of anterior chest pain radiating to back onset 3:30 AM today pain is intermittent. Nothing makes symptoms better or worse. Pain feels like anginal type pain she's had in the past. She treated herself with 3 sublingual nitroglycerin starting approximately an hour ago. She last took sublingual nitroglycerin 45 minutes ago. Presently describes mild anterior chest pressure. No other complaint. She denies any shortness of breath nausea or sweatiness. No other associated symptoms. She also treated herself with one baby aspirin and Plavix today.  HPI  Past Medical History:  Diagnosis Date  . COPD (chronic obstructive pulmonary disease) (HCC)   . Coronary artery disease   . Cough    " ALL MY LIFE "  . Dyspnea   . GERD (gastroesophageal reflux disease)   . NSTEMI (non-ST elevated myocardial infarction) (HCC) 09/01/2016   DES to Cx/OM bifurcation  . Unstable angina (HCC) 08/2016    Patient Active Problem List   Diagnosis Date Noted  . Diverticulitis 10/11/2016  . Coronary artery disease involving native coronary artery of native heart without angina pectoris   . Chest pain 09/30/2016  . Non-ST elevation (NSTEMI) myocardial infarction (HCC) 09/06/2016  . Coronary artery disease involving native coronary artery of native heart with unstable angina pectoris (HCC)   . Hyperlipidemia LDL goal <70   . Hypokalemia   . Unstable angina (HCC) 08/31/2016  . GERD (gastroesophageal reflux disease) 08/31/2016  . COPD (chronic obstructive pulmonary disease) (HCC) 08/31/2016  . Tobacco abuse 08/31/2016  . Chronic cough 08/31/2016    Past Surgical History:  Procedure Laterality Date  . ABDOMINAL HYSTERECTOMY    . CORONARY STENT INTERVENTION  09/05/2016   Successful  complex PCI of the circumflex/OM bifurcation using a Synergy DES  . CORONARY STENT INTERVENTION N/A 09/05/2016   Procedure: Coronary Stent Intervention;  Surgeon: Tonny Bollman, MD;  Location[facility] INVASIVE CV LAB;  Service: Cardiovascular;  Laterality: N/A;  . LEFT HEART CATH AND CORONARY ANGIOGRAPHY N/A 09/02/2016   Procedure: Left Heart Cath and Coronary Angiography;  Surgeon: Corky Crafts, MD;  Location: [facility] INVASIVE CV LAB;  Service: Cardiovascular;  Laterality: N/A;    OB History    Gravida Para Term Preterm AB Living   SAB TAB Ectopic Multiple Live Births   1               Home Medications    Prior to Admission medications   Medication Sig Start Date End Date Taking? Authorizing Provider  acetaminophen (TYLENOL) 500 MG tablet Take 1,000 mg by mouth every 6 (six) hours as needed for moderate pain.    [provider]  ADVAIR DISKUS 250-50 MCG/DOSE AEPB Inhale 1 puff into the lungs at bedtime as needed (for shortness of breath).  08/11/16   [provider]  aspirin EC 81 MG EC tablet Take 1 tablet (81 mg total) by mouth daily. 09/07/16   [facility], Roe Rutherford, PA  Chlorpheniramine Maleate (ALLERGY PO) Take 1 tablet by mouth daily. Equate brand    [provider]  Cholecalciferol (VITAMIN D3) 2000 units TABS Take 2,000 Units by mouth daily.    [provider]  Cinnamon 500 MG capsule Take 1,000 mg by mouth daily.  [provider]  clopidogrel (PLAVIX) 75 MG tablet Take 75 mg by mouth daily.    [provider]  esomeprazole (NEXIUM) 40 MG capsule Take 40 mg by mouth daily. 08/17/16   [provider]  Glucosamine-Chondroitin (MOVE FREE PO) Take 1 tablet by mouth daily.    [provider]  nitroGLYCERIN (NITROSTAT) 0.4 MG SL tablet Place 1 tablet (0.4 mg total) under the tongue every 5 (five) minutes as needed for chest pain. 09/06/16   [facility], Roe Rutherford, PA  Omega-3 Fatty Acids (FISH OIL OMEGA-3 PO)  Take 1 capsule by mouth daily.    [provider]  Probiotic Product (PROBIOTIC PO) Take 1 capsule by mouth daily.    [provider]  traMADol (ULTRAM) 50 MG tablet Take 1 tablet (50 mg total) by mouth every 6 (six) hours as needed. 10/12/16 10/12/17  Efrain Sella, MD  VENTOLIN HFA 108 (90 Base) MCG/ACT inhaler Inhale 2 puffs into the lungs every 6 (six) hours as needed for wheezing or shortness of breath.  08/17/16   [provider]  vitamin B-12 (CYANOCOBALAMIN) 500 MCG tablet Take 500 mcg by mouth daily.    [provider]  vitamin C (ASCORBIC ACID) 500 MG tablet Take 500 mg by mouth daily.    [provider]    Family History Family History  Problem Relation Age of Onset  . Asthma Mother   . Heart attack Father 20  . Cancer Other   . Heart attack Son 66    Social History Social History  Substance Use Topics  . Smoking status: Former Smoker    Packs/day: 0.50    Years: 50.00    Types: Cigarettes    Quit date: 08/31/2016  . Smokeless tobacco: Never Used  . Alcohol use Yes     Comment: occas     Allergies   Brilinta [ticagrelor]; Penicillins; and Sulfa antibiotics   Review of Systems Review of Systems  Constitutional: Negative.   HENT: Negative.   Respiratory: Negative.   Cardiovascular: Positive for chest pain.  Gastrointestinal: Negative.   Musculoskeletal: Negative.   Skin: Negative.   Neurological: Negative.   Psychiatric/Behavioral: Negative.   All other systems reviewed and are negative.    Physical Exam Updated Vital Signs BP (!) 131/100   Pulse 64   Temp 97.9 F (36.6 C)   Resp 18   Ht 5' 0.5" (1.537 m)   Wt 78.9 kg (174 lb)   LMP 09/01/2016   SpO2 91%   BMI 33.42 kg/m   Physical Exam  Constitutional: She is oriented to person, place, and time. She appears well-developed and well-nourished.  HENT:  Head: Normocephalic and atraumatic.  Eyes: Pupils are equal, round, and reactive to light. Conjunctivae  are normal.  Neck: Neck supple. No tracheal deviation present. No thyromegaly present.  Cardiovascular: Normal rate and regular rhythm.   No murmur heard. Pulmonary/Chest: Effort normal and breath sounds normal.  Abdominal: Soft. Bowel sounds are normal. She exhibits no distension. There is no tenderness.  Musculoskeletal: Normal range of motion. She exhibits no edema or tenderness.  Neurological: She is alert and oriented to person, place, and time. Coordination normal.  Skin: Skin is warm and dry. No rash noted.  Psychiatric: She has a normal mood and affect.  Nursing note and vitals reviewed.    ED Treatments / Results  Labs (all labs ordered are listed, but only abnormal results are displayed) Labs Reviewed  TROPONIN I  BASIC METABOLIC PANEL  CBC WITH DIFFERENTIAL/PLATELET   Results for orders placed or performed during the hospital encounter of 11/24/16  Troponin I  Result Value Ref Range   Troponin I <0.03 <0.03 ng/mL  Basic metabolic panel  Result Value Ref Range   Sodium 141 135 - 145 mmol/L   Potassium 3.7 3.5 - 5.1 mmol/L   Chloride 107 101 - 111 mmol/L   CO2 25 22 - 32 mmol/L   Glucose, Bld 112 (H) 65 - 99 mg/dL   BUN 10 6 - 20 mg/dL   Creatinine, Ser 1.61 0.44 - 1.00 mg/dL   Calcium 9.0 8.9 - 09.6 mg/dL   GFR calc non Af Amer >60 >60 mL/min   GFR calc Af Amer >60 >60 mL/min   Anion gap 9 5 - 15  CBC with Differential/Platelet  Result Value Ref Range   WBC 6.9 4.0 - 10.5 K/uL   RBC 4.48 3.87 - 5.11 MIL/uL   Hemoglobin 14.1 12.0 - 15.0 g/dL   HCT 04.5 40.9 - 81.1 %   MCV 96.9 78.0 - 100.0 fL   MCH 31.5 26.0 - 34.0 pg   MCHC 32.5 30.0 - 36.0 g/dL   RDW 91.4 78.2 - 95.6 %   Platelets 237 150 - 400 K/uL   Neutrophils Relative % 62 %   Neutro Abs 4.4 1.7 - 7.7 K/uL   Lymphocytes Relative 28 %   Lymphs Abs 1.9 0.7 - 4.0 K/uL   Monocytes Relative 7 %   Monocytes Absolute 0.5 0.1 - 1.0 K/uL   Eosinophils Relative 2 %   Eosinophils Absolute 0.2 0.0 - 0.7  K/uL   Basophils Relative 1 %   Basophils Absolute 0.0 0.0 - 0.1 K/uL   Dg Chest Port 1 View  Result Date: 11/24/2016 CLINICAL DATA:  Chest pain EXAM: PORTABLE CHEST 1 VIEW COMPARISON:  Chest radiograph 09/30/2016 FINDINGS: Normal cardiomediastinal contours. Hazy interstitial opacities bilaterally no focal consolidation. No pleural effusion or pneumothorax. Bibasilar atelectasis. IMPRESSION: Bibasilar atelectasis and mild pulmonary vascular congestion. Electronically Signed   By: Deatra Robinson M.D.   On: 11/24/2016 13:38    EKG  EKG Interpretation  Date/Time:  Thursday November 24 2016 12:29:59 EDT Ventricular Rate:  71 PR Interval:    QRS Duration: 99 QT Interval:  417 QTC Calculation: 454 R Axis:   35 Text Interpretation:  Sinus rhythm Ventricular premature complex Low voltage, extremity and precordial leads No significant change since last tracing Confirmed by Doug Sou [phone]) on 11/24/2016 12:36:51 PM     Chest x-ray viewed by me  Radiology No results found.  Procedures Procedures (including critical care time)  Medications Ordered in ED Medications  nitroGLYCERIN 50 mg in dextrose 5 % 250 mL (0.2 mg/mL) infusion (not administered)  aspirin chewable tablet 243 mg (not administered)     Initial Impression / Assessment and Plan / ED Course  I have reviewed the triage vital signs and the nursing notes.  Pertinent labs & imaging results that were available during my care of the patient were reviewed by me and considered in my medical decision making (see chart for details).     1:50 PM chest pressures improved after treatment with intravenous nitroglycerin drip titrated to pain and with oral aspirin. I spoke with Dr.Koneswaren cardiologist on call from [facility] heart care. He will consult on patient request of hospitalist. It is okay to keep patient here in light of initial negative troponin and no EKG changes. We will hold off on initiating heparin in  light of  patient's pain improving with nitroglycerin and aspirin. I've consulted hospitalist service Dr.Krishnon  to arrange for overnight stay  Final Clinical Impressions(s) / ED Diagnoses  Diagnosis unstable angina Final diagnoses:  None  CRITICAL CARE Performed by: Doug Sou Total critical care time: 30 minutes Critical care time was exclusive of separately billable procedures and treating other patients. Critical care was necessary to treat or prevent imminent or life-threatening deterioration. Critical care was time spent personally by me on the following activities: development of treatment plan with patient and/or surrogate as well as nursing, discussions with consultants, evaluation of patient's response to treatment, examination of patient, obtaining history from patient or surrogate, ordering and performing treatments and interventions, ordering and review of laboratory studies, ordering and review of radiographic studies, pulse oximetry and re-evaluation of patient's condition.  New Prescriptions New Prescriptions   No medications on file     Doug Sou, MD 11/24/16 1422

## 2016-11-24 NOTE — ED Notes (Signed)
Pt states she has chest pressure, chest pain was relieved by 3 nitro PTA. Had stent placement in June.

## 2016-11-24 NOTE — ED Notes (Addendum)
Pt not c/o any cp at this time.

## 2016-11-24 NOTE — ED Notes (Signed)
Stopped nitro drip at this time per MD Johnanna Schneiders. Pt unable to go to the floor at this time d/t drip.

## 2016-11-25 DIAGNOSIS — E785 Hyperlipidemia, unspecified: Secondary | ICD-10-CM | POA: Diagnosis not present

## 2016-11-25 DIAGNOSIS — I25110 Atherosclerotic heart disease of native coronary artery with unstable angina pectoris: Secondary | ICD-10-CM

## 2016-11-25 DIAGNOSIS — K219 Gastro-esophageal reflux disease without esophagitis: Secondary | ICD-10-CM

## 2016-11-25 DIAGNOSIS — R0789 Other chest pain: Secondary | ICD-10-CM

## 2016-11-25 DIAGNOSIS — Z720 Tobacco use: Secondary | ICD-10-CM | POA: Diagnosis not present

## 2016-11-25 LAB — TROPONIN I

## 2016-11-25 MED ORDER — ATORVASTATIN CALCIUM 40 MG PO TABS: 40.0000 mg | ORAL_TABLET | Freq: Every day | ORAL | 3 refills | Status: DC

## 2016-11-25 MED ORDER — RANOLAZINE ER 500 MG PO TB12: 500.0000 mg | ORAL_TABLET | Freq: Two times a day (BID) | ORAL | 3 refills | Status: DC

## 2016-11-25 MED ORDER — RANOLAZINE ER 500 MG PO TB12
500.0000 mg | ORAL_TABLET | Freq: Two times a day (BID) | ORAL | Status: DC
  Administered 2016-11-25: 500 mg via ORAL
  Filled 2016-11-25: qty 1

## 2016-11-25 NOTE — Discharge Summary (Signed)
Physician Discharge Summary  Lisa [name] UJW:[id number] DOB: September 04, 1945 DOA: 11/24/2016  PCP: Kirstie Peri, MD  Admit date: 11/24/2016 Discharge date: 11/25/2016  Time spent: Greater than 30 minutes  Recommendations for Outpatient Follow-up:  1. Patient was instructed to follow-up with cardiology in 2 weeks. Ranexa was added during the hospital course.   Discharge Diagnoses:    Chest pain   GERD (gastroesophageal reflux disease)   COPD (chronic obstructive pulmonary disease) (HCC)   Tobacco abuse   Coronary artery disease involving native coronary artery of native heart with unstable angina pectoris (HCC)   Hyperlipidemia LDL goal <70   Glaucoma   Discharge Condition: Improved  Diet recommendation: Heart healthy  Filed Weights   11/24/16 1231 11/24/16 1809  Weight: 78.9 kg (174 lb) 78.9 kg (174 lb)    History of present illness:  Lisa [name] is a 71 y.o. female with medical history significant for CAD status post drug eluding stent placement in June 2018 as well as ongoing tobacco abuse who was in her usual state of health when she woke up complaining of chest discomfort. She described it as a heavy pressure-like sensation across the anterior aspect of her chest radiating all the way to her back. No associated dizziness, nausea or vomiting or shortness of breath. Symptoms persisted regardless of activity or rest. No relief with positioning or with taking aspirin. She came into the emergency room for further evaluation. In the emergency room, EKG, enzymes and other lab work were unremarkable. Patient was given morphine and put on nitroglycerin drip. Pain eventually subsided after several hours. She was admitted for further evaluation and management.  Hospital Course:  The patient was restarted on all of her chronic medications. Supportive treatment was given. Nitroglycerin drip was discontinued in favor of as needed sublingual nitroglycerin. Morphine was also added as needed  for discomfort. She was encouraged/instructed to stop smoking. She was continued on her PPI. For further evaluation, cardiac enzymes were ordered. Her troponin I was negative 3, ruling her out for ACS. Follow-up EKG revealed normal sinus rhythm without ischemic ST-T wave abnormalities. Cardiology was consulted. Drs. Konewaren and Branch provided the evaluations and recommendations. Apparently, the patient had not been on a beta blocker due to bradycardia. She had also not been on an ACE inhibitor/ARB due to occasional reports of hypotension. She had previously been on atorvastatin, but it was inadvertently discontinued by the patient when her LDL fell to 30 from 80 recently. It was supposed to have been decreased to 40 mg daily. Cardiology restarted it at 40 mg daily. Also, a trial of Ranexa 500 mg twice a day as an antianginal agent was added by Dr. Wyline Mood. Dr. Wyline Mood went on to say that if her symptoms recur, a repeat follow-up cardiac catheterization should be considered.  Patient's chest pain resolved. She remained chest pain-free throughout the hospitalization. She remained hemodynamically stable. She was instructed to follow-up with cardiology in 2 weeks. She voiced understanding.   Procedures:  None  Consultations:  Cardiology  Discharge Exam: Vitals:   11/25/16 0836 11/25/16 1000  BP:  (!) 134/96  Pulse:  68  Resp:  19  Temp:  98.2 F (36.8 C)  SpO2: 91% 93%    General: Pleasant 71 year old in no acute distress. Cardiovascular: S1, S2, no murmurs rubs or gallops. No pedal edema. Respiratory: Clear to auscultation bilaterally.  Discharge Instructions   Discharge Instructions    Diet - low sodium heart healthy    Complete by:  As directed  Increase activity slowly    Complete by:  As directed      Current Discharge Medication List    START taking these medications   Details  atorvastatin (LIPITOR) 40 MG tablet Take 1 tablet (40 mg total) by mouth daily at 6  PM. Qty: 30 tablet, Refills: 3    ranolazine (RANEXA) 500 MG 12 hr tablet Take 1 tablet (500 mg total) by mouth 2 (two) times daily. Qty: 60 tablet, Refills: 3      CONTINUE these medications which have NOT CHANGED   Details  acetaminophen (TYLENOL) 500 MG tablet Take 1,000 mg by mouth every 6 (six) hours as needed for moderate pain.    ADVAIR DISKUS 250-50 MCG/DOSE AEPB Inhale 1 puff into the lungs at bedtime as needed (for shortness of breath).     aspirin EC 81 MG EC tablet Take 1 tablet (81 mg total) by mouth daily. Qty: 90 tablet, Refills: 3    Chlorpheniramine Maleate (ALLERGY PO) Take 1 tablet by mouth daily. Equate brand    Cholecalciferol (VITAMIN D3) 2000 units TABS Take 2,000 Units by mouth daily.    Cinnamon 500 MG capsule Take 1,000 mg by mouth daily.    clopidogrel (PLAVIX) 75 MG tablet Take 75 mg by mouth daily.    esomeprazole (NEXIUM) 40 MG capsule Take 40 mg by mouth daily.    Glucosamine-Chondroitin (MOVE FREE PO) Take 1 tablet by mouth daily.    nitroGLYCERIN (NITROSTAT) 0.4 MG SL tablet Place 1 tablet (0.4 mg total) under the tongue every 5 (five) minutes as needed for chest pain. Qty: 25 tablet, Refills: 1    Omega-3 Fatty Acids (FISH OIL OMEGA-3 PO) Take 1 capsule by mouth daily.    Probiotic Product (PROBIOTIC PO) Take 1 capsule by mouth daily.    traMADol (ULTRAM) 50 MG tablet Take 1 tablet (50 mg total) by mouth every 6 (six) hours as needed. Qty: 20 tablet, Refills: 0    VENTOLIN HFA 108 (90 Base) MCG/ACT inhaler Inhale 2 puffs into the lungs every 6 (six) hours as needed for wheezing or shortness of breath.     vitamin B-12 (CYANOCOBALAMIN) 500 MCG tablet Take 500 mcg by mouth daily.    vitamin C (ASCORBIC ACID) 500 MG tablet Take 500 mg by mouth daily.       Allergies  Allergen Reactions  . Brilinta [Ticagrelor] Shortness Of Breath  . Penicillins Anaphylaxis    Has patient had a PCN reaction causing immediate rash, facial/tongue/throat  swelling, SOB or lightheadedness with hypotension: Yes Has patient had a PCN reaction causing severe rash involving mucus membranes or skin necrosis: yes Has patient had a PCN reaction that required hospitalization: no Has patient had a PCN reaction occurring within the last 10 years: no If all of the above answers are "NO", then may proceed with Cephalosporin use.   . Sulfa Antibiotics Other (See Comments)    Hands and feet blistered   Follow-up Information    Jodelle Gross, NP Follow up on 12/09/2016.   Specialties:  Nurse Practitioner, Radiology, Cardiology Why:  1 pm Please bring all of your medications with you to the appointment  Contact information: [address] [place] [address] [phone]        Kirstie Peri, MD. Schedule an appointment as soon as possible for a visit in 2 week(s).   Specialty:  Internal Medicine Contact information: [address] [place] [address] [phone]  The results of significant diagnostics from this hospitalization (including imaging, microbiology, ancillary and laboratory) are listed below for reference.    Significant Diagnostic Studies: Dg Chest Port 1 View  Result Date: 11/24/2016 CLINICAL DATA:  Chest pain EXAM: PORTABLE CHEST 1 VIEW COMPARISON:  Chest radiograph 09/30/2016 FINDINGS: Normal cardiomediastinal contours. Hazy interstitial opacities bilaterally no focal consolidation. No pleural effusion or pneumothorax. Bibasilar atelectasis. IMPRESSION: Bibasilar atelectasis and mild pulmonary vascular congestion. Electronically Signed   By: Deatra Robinson M.D.   On: 11/24/2016 13:38    Microbiology: No results found for this or any previous visit (from the past 240 hour(s)).   Labs: Basic Metabolic Panel:  Recent Labs Lab 11/24/16 1245  NA 141  K 3.7  CL 107  CO2 25  GLUCOSE 112*  BUN 10  CREATININE 0.73  CALCIUM 9.0   Liver Function Tests: No results for input(s): AST, ALT, ALKPHOS, BILITOT,  PROT, ALBUMIN in the last 168 hours. No results for input(s): LIPASE, AMYLASE in the last 168 hours. No results for input(s): AMMONIA in the last 168 hours. CBC:  Recent Labs Lab 11/24/16 1245  WBC 6.9  NEUTROABS 4.4  HGB 14.1  HCT 43.4  MCV 96.9  PLT 237   Cardiac Enzymes:  Recent Labs Lab 11/24/16 1245 11/24/16 1824 11/24/16 2132 11/25/16 0349  TROPONINI <0.03 <0.03 <0.03 <0.03   BNP: BNP (last 3 results)  Recent Labs  09/01/16 0024 09/30/16 1100  BNP 86.2 72.0    ProBNP (last 3 results) No results for input(s): PROBNP in the last 8760 hours.  CBG: No results for input(s): GLUCAP in the last 168 hours.     Signed:  Damiya Sandefur MD.  [facility] 11/25/2016, 11:54 AM

## 2016-11-25 NOTE — Progress Notes (Addendum)
Progress Note  [name] Date of Encounter: 11/25/2016   Subjective   No complaints.   Inpatient Medications    Scheduled Meds: . aspirin EC  81 mg Oral Daily  . atorvastatin  40 mg Oral q1800  . clopidogrel  75 mg Oral Daily  . enoxaparin (LOVENOX) injection  40 mg Subcutaneous Q24H  . Influenza vac split quadrivalent PF  0.5 mL Intramuscular Tomorrow-1000  . mometasone-formoterol  2 puff Inhalation BID   Continuous Infusions: . nitroGLYCERIN Stopped (11/24/16 1624)   PRN Meds: acetaminophen, gi cocktail, morphine injection, ondansetron (ZOFRAN) IV   Vital Signs    Vitals:   11/25/16 0313 11/25/16 0600 11/25/16 0624 11/25/16 0836  BP: 136/80 91/78 122/68   Pulse:  63    Resp:  18    Temp:  97.8 F (36.6 C)    TempSrc:  Oral    SpO2:  94%  91%  Weight:      Height:        Intake/Output Summary (Last 24 hours) at 11/25/16 0954 Last data filed at 11/24/16 1700  Gross per 24 hour  Intake           244.43 ml  Output                0 ml  Net           244.43 ml   Filed Weights   11/24/16 1231 11/24/16 1809  Weight: 174 lb (78.9 kg) 174 lb (78.9 kg)    Telemetry    SR - Personally Reviewed  ECG    n/a  Physical Exam   GEN: No acute distress.   Neck: No JVD Cardiac: RRR, no murmurs, rubs, or gallops.  Respiratory: Clear to auscultation bilaterally. GI: Soft, nontender, non-distended  MS: No edema; No deformity. Neuro:  Nonfocal  Psych: Normal affect   Labs    Chemistry Recent Labs Lab 11/24/16 1245  NA 141  K 3.7  CL 107  CO2 25  GLUCOSE 112*  BUN 10  CREATININE 0.73  CALCIUM 9.0  GFRNONAA >60  GFRAA >60  ANIONGAP 9     Hematology Recent Labs Lab 11/24/16 1245  WBC 6.9  RBC 4.48  HGB 14.1  HCT 43.4  MCV 96.9  MCH 31.5  MCHC 32.5  RDW 14.3  PLT 237    Cardiac Enzymes Recent Labs Lab 11/24/16 1245 11/24/16 1824 11/24/16 2132 11/25/16 0349  TROPONINI <0.03 <0.03 <0.03 <0.03   No results for  input(s): TROPIPOC in the last 168 hours.   BNPNo results for input(s): BNP, PROBNP in the last 168 hours.   DDimer No results for input(s): DDIMER in the last 168 hours.   Radiology    Dg Chest Port 1 View  Result Date: 11/24/2016 CLINICAL DATA:  Chest pain EXAM: PORTABLE CHEST 1 VIEW COMPARISON:  Chest radiograph 09/30/2016 FINDINGS: Normal cardiomediastinal contours. Hazy interstitial opacities bilaterally no focal consolidation. No pleural effusion or pneumothorax. Bibasilar atelectasis. IMPRESSION: Bibasilar atelectasis and mild pulmonary vascular congestion. Electronically Signed   By: Deatra Robinson M.D.   On: 11/24/2016 13:38    Cardiac Studies    Patient Profile     Lisa [name] is a 71 y.o. female with a hx of CAD and PCI who is being seen today for the evaluation of chest pain at the request of Dr. Rito Ehrlich.  Assessment & Plan    1. CAD/chest pain - history of complex PCI LCX/OM bifurcation with DES 09/05/16.  -  side effects on brillinta, changed to plavix - admitted again 09/2016 with chest pain, negative ACS workup -this admit trop neg x 4, EKG SR without acute ischemic changes - has not been on beta blocker due to bradycardia, had 3 sec pause and heart rates to 30s while on lopressor in 08/2016 admission. Has not been on ACE-I/ARB. From what I gather she reports occasional low bp's SBP to 90s at home, I presume this is why not on. She does have some occasional low bps this admit. She was to lower her atorva from 80 to 40 after LDL dropped from 80 to 30, however appears atorva had been stopped as outpatient. Restarted on atorva 40 this admit - trial of ranexa  bid as antianginal - if recurrence or progressive symptoms consider repeat cath   [facility] for discharge today. F/u 2 weeks with PA   For questions or updates, please contact [facility] Please consult [email or web address] for contact info under Cardiology/STEMI.      Joanie Coddington, MD  11/25/2016, 9:54  AM

## 2016-11-25 NOTE — Care Management Note (Signed)
Case Management Note  Patient Details  Name: Lisa [name] MRN: [id number] Date of Birth: Jun 22, 1945  Subjective/Objective:                  Admitted for r/o CP. Pt seen to administer MOON. Pt is from home, lives with family, family at bedside. Pt has PCP, transportation and insurance with drug coverage. She has no HH or DME needs. Pt communicates no needs or concerns.  Action/Plan: DC home with self care once work up complete. No CM needs at this time.   Expected Discharge Date:  11/25/16               Expected Discharge Plan:  Home/Self Care  In-House Referral:  NA  Discharge planning Services  CM Consult  Post Acute Care Choice:  NA Choice offered to:  NA  Status of Service:  Completed, signed off  Malcolm Metro, RN 11/25/2016, 9:32 AM

## 2016-11-25 NOTE — Care Management Obs Status (Signed)
MEDICARE OBSERVATION STATUS NOTIFICATION   Patient Details  Name: Lisa [name] MRN: [id number] Date of Birth: 12/24/1945   Medicare Observation Status Notification Given:  Yes    Malcolm Metro, RN 11/25/2016, 9:31 AM

## 2016-11-30 DIAGNOSIS — F17210 Nicotine dependence, cigarettes, uncomplicated: Secondary | ICD-10-CM | POA: Diagnosis not present

## 2016-11-30 DIAGNOSIS — Z713 Dietary counseling and surveillance: Secondary | ICD-10-CM | POA: Diagnosis not present

## 2016-11-30 DIAGNOSIS — I2510 Atherosclerotic heart disease of native coronary artery without angina pectoris: Secondary | ICD-10-CM | POA: Diagnosis not present

## 2016-11-30 DIAGNOSIS — E7800 Pure hypercholesterolemia, unspecified: Secondary | ICD-10-CM | POA: Diagnosis not present

## 2016-11-30 DIAGNOSIS — Z6827 Body mass index (BMI) 27.0-27.9, adult: Secondary | ICD-10-CM | POA: Diagnosis not present

## 2016-11-30 DIAGNOSIS — J449 Chronic obstructive pulmonary disease, unspecified: Secondary | ICD-10-CM | POA: Diagnosis not present

## 2016-11-30 DIAGNOSIS — B3781 Candidal esophagitis: Secondary | ICD-10-CM | POA: Diagnosis not present

## 2016-12-06 ENCOUNTER — Telehealth: Payer: Self-pay | Admitting: Adult Health

## 2016-12-06 NOTE — Telephone Encounter (Signed)
Patient states that Ramolazine is causing SOB/tg

## 2016-12-06 NOTE — Telephone Encounter (Signed)
Ok to stop of this is of no benefit to her and causing a change in breathing status.

## 2016-12-06 NOTE — Telephone Encounter (Signed)
I will forward to Dr.Lawrence for disposition

## 2016-12-06 NOTE — Telephone Encounter (Signed)
Pt will stop Ranexa.

## 2016-12-09 ENCOUNTER — Ambulatory Visit: Payer: MEDICARE | Admitting: Adult Health

## 2016-12-13 NOTE — Progress Notes (Deleted)
Cardiology Office Note   Date:  12/13/2016   ID:  Lisa [name], DOB August 07, 1945, MRN [id number]  PCP:  Kirstie Peri, MD  Cardiologist:  Dr. Tenny Craw  No chief complaint on file.     History of Present Illness: Lisa [name] is a 71 y.o. female who presents for ongoing assessment and management post hospitalization for chest pain. She has a hx of CAD with DES to the CX/OM bifurcation, with residual 25% stenosis of RCA. She was ruled out for ACS.She was continued on DAPT, statin, and has had resolution of LEE while admitted.   Past Medical History:  Diagnosis Date  . COPD (chronic obstructive pulmonary disease) (HCC)   . Coronary artery disease   . Cough    " ALL MY LIFE "  . Dyspnea   . GERD (gastroesophageal reflux disease)   . NSTEMI (non-ST elevated myocardial infarction) (HCC) 09/01/2016   DES to Cx/OM bifurcation  . Unstable angina (HCC) 08/2016    Past Surgical History:  Procedure Laterality Date  . ABDOMINAL HYSTERECTOMY    . CORONARY STENT INTERVENTION  09/05/2016   Successful complex PCI of the circumflex/OM bifurcation using a Synergy DES  . CORONARY STENT INTERVENTION N/A 09/05/2016   Procedure: Coronary Stent Intervention;  Surgeon: Tonny Bollman, MD;  Location: [facility] INVASIVE CV LAB;  Service: Cardiovascular;  Laterality: N/A;  . LEFT HEART CATH AND CORONARY ANGIOGRAPHY N/A 09/02/2016   Procedure: Left Heart Cath and Coronary Angiography;  Surgeon: Corky Crafts, MD;  Location: [facility] INVASIVE CV LAB;  Service: Cardiovascular;  Laterality: N/A;     Current Outpatient Prescriptions  Medication Sig Dispense Refill  . acetaminophen (TYLENOL) 500 MG tablet Take 1,000 mg by mouth every 6 (six) hours as needed for moderate pain.    Marland Kitchen ADVAIR DISKUS 250-50 MCG/DOSE AEPB Inhale 1 puff into the lungs at bedtime as needed (for shortness of breath).     Marland Kitchen aspirin EC 81 MG EC tablet Take 1 tablet (81 mg total) by mouth daily. 90 tablet 3  . atorvastatin (LIPITOR) 40 MG  tablet Take 1 tablet (40 mg total) by mouth daily at 6 PM. 30 tablet 3  . Chlorpheniramine Maleate (ALLERGY PO) Take 1 tablet by mouth daily. Equate brand    . Cholecalciferol (VITAMIN D3) 2000 units TABS Take 2,000 Units by mouth daily.    . Cinnamon 500 MG capsule Take 1,000 mg by mouth daily.    . clopidogrel (PLAVIX) 75 MG tablet Take 75 mg by mouth daily.    Marland Kitchen esomeprazole (NEXIUM) 40 MG capsule Take 40 mg by mouth daily.    . Glucosamine-Chondroitin (MOVE FREE PO) Take 1 tablet by mouth daily.    . nitroGLYCERIN (NITROSTAT) 0.4 MG SL tablet Place 1 tablet (0.4 mg total) under the tongue every 5 (five) minutes as needed for chest pain. 25 tablet 1  . Omega-3 Fatty Acids (FISH OIL OMEGA-3 PO) Take 1 capsule by mouth daily.    . Probiotic Product (PROBIOTIC PO) Take 1 capsule by mouth daily.    . traMADol (ULTRAM) 50 MG tablet Take 1 tablet (50 mg total) by mouth every 6 (six) hours as needed. 20 tablet 0  . VENTOLIN HFA 108 (90 Base) MCG/ACT inhaler Inhale 2 puffs into the lungs every 6 (six) hours as needed for wheezing or shortness of breath.     . vitamin B-12 (CYANOCOBALAMIN) 500 MCG tablet Take 500 mcg by mouth daily.    . vitamin C (ASCORBIC ACID) 500 MG tablet Take  500 mg by mouth daily.     No current facility-administered medications for this visit.     Allergies:   Brilinta [ticagrelor]; Penicillins; Ranexa [ranolazine]; and Sulfa antibiotics    Social History:  The patient  reports that she has been smoking Cigarettes.  She has a 25.00 pack-year smoking history. She has never used smokeless tobacco. She reports that she drinks alcohol. She reports that she does not use drugs.   Family History:  The patient's family history includes Asthma in her mother; Cancer in her other; Heart attack (age of onset: 69) in her father; Heart attack (age of onset: 59) in her son.    ROS: All other systems are reviewed and negative. Unless otherwise mentioned in H&P    PHYSICAL EXAM: VS:   LMP 09/01/2016  , BMI There is no height or weight on file to calculate BMI. GEN: Well nourished, well developed, in no acute distress  HEENT: normal  Neck: no JVD, carotid bruits, or masses Cardiac: ***RRR; no murmurs, rubs, or gallops,no edema  Respiratory:  clear to auscultation bilaterally, normal work of breathing GI: soft, nontender, nondistended, + BS MS: no deformity or atrophy  Skin: warm and dry, no rash Neuro:  Strength and sensation are intact Psych: euthymic mood, full affect   EKG:  EKG {ACTION; IS/IS [id number]} ordered today. The ekg ordered today demonstrates ***   Recent Labs: 09/01/2016: TSH 4.634 09/30/2016: B Natriuretic Peptide 72.0; Magnesium 1.9 10/25/2016: ALT 18 11/24/2016: BUN 10; Creatinine, Ser 0.73; Hemoglobin 14.1; Platelets 237; Potassium 3.7; Sodium 141    Lipid Panel    Component Value Date/Time   CHOL 76 10/25/2016 0818   TRIG 81 10/25/2016 0818   HDL 30 (L) 10/25/2016 0818   CHOLHDL 2.5 10/25/2016 0818   VLDL 16 10/25/2016 0818   LDLCALC 30 10/25/2016 0818      Wt Readings from Last 3 Encounters:  11/24/16 174 lb (78.9 kg)  11/04/16 175 lb (79.4 kg)  10/11/16 173 lb 9.6 oz (78.7 kg)      Other studies Reviewed: Additional studies/ records that were reviewed today include: ***. Review of the above records demonstrates: ***   ASSESSMENT AND PLAN:  1.  ***   Current medicines are reviewed at length with the patient today.    Labs/ tests ordered today include: *** Bettey Mare. Liborio Nixon, ANP, AACC   12/13/2016 11:15 AM    [facility] [address], [place], [place] [address] Phone: [phone]; Fax: [phone]

## 2016-12-15 ENCOUNTER — Encounter: Payer: Self-pay | Admitting: Adult Health

## 2016-12-15 ENCOUNTER — Ambulatory Visit: Payer: MEDICARE | Admitting: Adult Health

## 2016-12-29 DIAGNOSIS — E663 Overweight: Secondary | ICD-10-CM | POA: Diagnosis not present

## 2016-12-29 DIAGNOSIS — E7800 Pure hypercholesterolemia, unspecified: Secondary | ICD-10-CM | POA: Diagnosis not present

## 2016-12-29 DIAGNOSIS — Z1339 Encounter for screening examination for other mental health and behavioral disorders: Secondary | ICD-10-CM | POA: Diagnosis not present

## 2016-12-29 DIAGNOSIS — R5383 Other fatigue: Secondary | ICD-10-CM | POA: Diagnosis not present

## 2016-12-29 DIAGNOSIS — Z0000 Encounter for general adult medical examination without abnormal findings: Secondary | ICD-10-CM | POA: Diagnosis not present

## 2016-12-29 DIAGNOSIS — J449 Chronic obstructive pulmonary disease, unspecified: Secondary | ICD-10-CM | POA: Diagnosis not present

## 2016-12-29 DIAGNOSIS — Z299 Encounter for prophylactic measures, unspecified: Secondary | ICD-10-CM | POA: Diagnosis not present

## 2016-12-29 DIAGNOSIS — I2510 Atherosclerotic heart disease of native coronary artery without angina pectoris: Secondary | ICD-10-CM | POA: Diagnosis not present

## 2016-12-29 DIAGNOSIS — Z5320 Procedure and treatment not carried out because of patient's decision for unspecified reasons: Secondary | ICD-10-CM | POA: Diagnosis not present

## 2016-12-29 DIAGNOSIS — Z7189 Other specified counseling: Secondary | ICD-10-CM | POA: Diagnosis not present

## 2016-12-29 DIAGNOSIS — Z1331 Encounter for screening for depression: Secondary | ICD-10-CM | POA: Diagnosis not present

## 2016-12-29 DIAGNOSIS — Z1211 Encounter for screening for malignant neoplasm of colon: Secondary | ICD-10-CM | POA: Diagnosis not present

## 2016-12-30 DIAGNOSIS — E7800 Pure hypercholesterolemia, unspecified: Secondary | ICD-10-CM | POA: Diagnosis not present

## 2016-12-30 DIAGNOSIS — Z79899 Other long term (current) drug therapy: Secondary | ICD-10-CM | POA: Diagnosis not present

## 2016-12-30 NOTE — Progress Notes (Signed)
Cardiology Office Note   Date:  01/02/2017   ID:  Lisa [name], DOB Apr 30, 1945, MRN [id number]  PCP:  Lisa [name], Ashish, MD  Cardiologist: Lisa PatesPaula Ross, MD  Chief Complaint  Patient presents with  . Coronary Artery Disease  . Hyperlipidemia  . Hypertension      History of Present Illness: Lisa SimsCarolyn [name] is a 71 y.o. female who presents for ongoing assessment and management of coronary artery disease, history of PCI.  The patient has recently been discharged from the hospital on 11/25/2016 in the setting of recurrent chest pain.  He was ruled out for ACS.  She has a history of beta-blocker intolerance due to bradycardia.  She was started on a trial of Ranexa 500 mg twice daily as an antianginal agent.  If symptoms persisted on follow-up consideration for repeat catheterization would be discussed.  Today without any complaints.  She did say that she was unable to tolerate Ranexa as it was causing shortness of breath and this has been added to her medication intolerance list.  She has had no recurrence of chest discomfort.  She feels she is doing well and offers no further symptoms.  Past Medical History:  Diagnosis Date  . COPD (chronic obstructive pulmonary disease) (HCC)   . Coronary artery disease   . Cough    " ALL MY LIFE "  . Dyspnea   . GERD (gastroesophageal reflux disease)   . NSTEMI (non-ST elevated myocardial infarction) (HCC) 09/01/2016   DES to Cx/OM bifurcation  . Unstable angina (HCC) 08/2016    Past Surgical History:  Procedure Laterality Date  . ABDOMINAL HYSTERECTOMY    . CORONARY STENT INTERVENTION  09/05/2016   Successful complex PCI of the circumflex/OM bifurcation using a Synergy DES     Current Outpatient Medications  Medication Sig Dispense Refill  . acetaminophen (TYLENOL) 500 MG tablet Take 1,000 mg by mouth every 6 (six) hours as needed for moderate pain.    Marland Kitchen. ADVAIR DISKUS 250-50 MCG/DOSE AEPB Inhale 1 puff into the lungs at bedtime as needed (for  shortness of breath).     Marland Kitchen. aspirin EC 81 MG EC tablet Take 1 tablet (81 mg total) by mouth daily. 90 tablet 3  . atorvastatin (LIPITOR) 40 MG tablet Take 1 tablet (40 mg total) by mouth daily at 6 PM. 30 tablet 3  . Chlorpheniramine Maleate (ALLERGY PO) Take 1 tablet by mouth daily. Equate brand    . Cholecalciferol (VITAMIN D3) 2000 units TABS Take 2,000 Units by mouth daily.    . Cinnamon 500 MG capsule Take 1,000 mg by mouth daily.    . clopidogrel (PLAVIX) 75 MG tablet Take 75 mg by mouth daily.    Marland Kitchen. esomeprazole (NEXIUM) 40 MG capsule Take 40 mg by mouth daily.    . Glucosamine-Chondroitin (MOVE FREE PO) Take 1 tablet by mouth daily.    . nitroGLYCERIN (NITROSTAT) 0.4 MG SL tablet Place 1 tablet (0.4 mg total) under the tongue every 5 (five) minutes as needed for chest pain. 25 tablet 1  . Omega-3 Fatty Acids (FISH OIL OMEGA-3 PO) Take 1 capsule by mouth daily.    . Probiotic Product (PROBIOTIC PO) Take 1 capsule by mouth daily.    . VENTOLIN HFA 108 (90 Base) MCG/ACT inhaler Inhale 2 puffs into the lungs every 6 (six) hours as needed for wheezing or shortness of breath.     . vitamin B-12 (CYANOCOBALAMIN) 500 MCG tablet Take 500 mcg by mouth daily.    . vitamin  C (ASCORBIC ACID) 500 MG tablet Take 500 mg by mouth daily.     No current facility-administered medications for this visit.     Allergies:   Brilinta [ticagrelor]; Penicillins; Ranexa [ranolazine]; and Sulfa antibiotics    Social History:  The patient  reports that she has been smoking cigarettes.  She has a 25.00 pack-year smoking history. she has never used smokeless tobacco. She reports that she drinks alcohol. She reports that she does not use drugs.   Family History:  The patient's family history includes Asthma in her mother; Heart attack (age of onset: 38) in her father; Heart attack (age of onset: 72) in her son; Prostate cancer in her brother.    ROS: All other systems are reviewed and negative. Unless otherwise  mentioned in H&P    PHYSICAL EXAM: VS:  BP (!) 144/76   Pulse 74   Ht 5\' 6"  (1.676 m)   Wt 178 lb (80.7 kg)   LMP 09/01/2016   SpO2 94%   BMI 28.73 kg/m  , BMI Body mass index is 28.73 kg/m. GEN: Well nourished, well developed, in no acute distress  HEENT: normal  Neck: no JVD, carotid bruits, or masses Cardiac: RRR; no murmurs, rubs, or gallops,no edema  Respiratory:  clear to auscultation bilaterally, normal work of breathing GI: soft, nontender, nondistended, + BS MS: no deformity or atrophy  Skin: warm and dry, no rash Neuro:  Strength and sensation are intact Psych: euthymic mood, full affect  Recent Labs: 09/01/2016: TSH 4.634 09/30/2016: B Natriuretic Peptide 72.0; Magnesium 1.9 10/25/2016: ALT 18 11/24/2016: BUN 10; Creatinine, Ser 0.73; Hemoglobin 14.1; Platelets 237; Potassium 3.7; Sodium 141    Lipid Panel    Component Value Date/Time   CHOL 76 10/25/2016 0818   TRIG 81 10/25/2016 0818   HDL 30 (L) 10/25/2016 0818   CHOLHDL 2.5 10/25/2016 0818   VLDL 16 10/25/2016 0818   LDLCALC 30 10/25/2016 0818      Wt Readings from Last 3 Encounters:  01/02/17 178 lb (80.7 kg)  11/24/16 174 lb (78.9 kg)  11/04/16 175 lb (79.4 kg)      Other studies Reviewed:  Echocardiogram 09/02/2016 Left ventricle: The cavity size was normal. Wall thickness was   increased in a pattern of mild LVH. Systolic function was normal.   The estimated ejection fraction was in the range of 60% to 65%.   Doppler parameters are consistent with abnormal left ventricular   relaxation (grade 1 diastolic dysfunction). - Mitral valve: There was mild regurgitation.  Cardiac Cath 09/05/2016 Coronary Findings   Dominance: Right  Left Circumflex  Dist Cx lesion, 90% stenosed. Culprit lesion. The lesion is type C and located at the bifurcation.  Angioplasty: Lesion crossed with guidewire using a WIRE HI TORQ WHISPER MS 190CM. Angioplasty alone was performed using a BALLOON SAPPHIRE 2.5X12. The  pre-interventional distal flow is normal (TIMI 3). The post-interventional distal flow is normal (TIMI 3). The intervention was successful . No complications occurred at this lesion.  There is a 20% residual stenosis post intervention.  Second Obtuse Marginal Branch  Ost 2nd Mrg to 2nd Mrg lesion, 90% stenosed.  Angioplasty: Lesion crossed with guidewire using a WIRE COUGAR XT STRL 190CM. Pre-stent angioplasty was performed. A STENT SYNERGY DES [id number] drug eluting stent was successfully placed. Post-stent angioplasty was performed using a BALLOON SAPPHIRE Crockett [id number]. The pre-interventional distal flow is normal (TIMI 3). The post-interventional distal flow is normal (TIMI 3). The intervention was successful . No  complications occurred at this lesion.  There is a 10% residual stenosis post intervention.  Right Coronary Artery  Prox RCA to Mid RCA lesion, 25% stenosed.     ASSESSMENT AND PLAN:  1.  Coronary artery disease, history of PCI to the circumflex and OM at the bifurcation.  The patient is been without complaints of chest discomfort since discharge from the hospital.  She has been unable to tolerate Ranexa.  Since stopping it she has had no recurrent discomfort.  She will continue dual antiplatelet therapy.  2.  Hypercholesterolemia: She is intolerant of full dose atorvastatin has been taking 40 mg by day instead of 80 mg.  3.  COPD: No worsening of symptoms currently.  She will follow with your primary care for ongoing management.  She is due to see her in 4 days with labs.  Current medicines are reviewed at length with the patient today.    Labs/ tests ordered today include:  Bettey Mare. Liborio Nixon, ANP, AACC   01/02/2017 3:25 PM    [facility] [address], [place], [place] [address] Phone: [phone]; Fax: [phone]

## 2017-01-02 ENCOUNTER — Ambulatory Visit (INDEPENDENT_AMBULATORY_CARE_PROVIDER_SITE_OTHER): Payer: MEDICARE | Admitting: Adult Health

## 2017-01-02 ENCOUNTER — Ambulatory Visit: Payer: MEDICARE | Admitting: Internal Medicine

## 2017-01-02 ENCOUNTER — Encounter: Payer: Self-pay | Admitting: Adult Health

## 2017-01-02 VITALS — BP 144/76 | HR 74 | Ht 66.0 in | Wt 178.0 lb

## 2017-01-02 DIAGNOSIS — I200 Unstable angina: Secondary | ICD-10-CM

## 2017-01-02 DIAGNOSIS — I2510 Atherosclerotic heart disease of native coronary artery without angina pectoris: Secondary | ICD-10-CM

## 2017-01-02 DIAGNOSIS — I10 Essential (primary) hypertension: Secondary | ICD-10-CM

## 2017-01-02 DIAGNOSIS — E7800 Pure hypercholesterolemia, unspecified: Secondary | ICD-10-CM

## 2017-01-02 NOTE — Patient Instructions (Signed)
Medication Instructions:  Your physician recommends that you continue on your current medications as directed. Please refer to the Current Medication list given to you today.   Labwork: NONE   Testing/Procedures: NONE   Follow-Up: Your physician wants you to follow-up in: 6 Months. You will receive a reminder letter in the mail two months in advance. If you don't receive a letter, please call our office to schedule the follow-up appointment.   Any Other Special Instructions Will Be Listed Below (If Applicable).     If you need a refill on your cardiac medications before your next appointment, please call your pharmacy.  Thank you for choosing [facility]!   

## 2017-01-03 ENCOUNTER — Telehealth: Payer: Self-pay | Admitting: Adult Health

## 2017-01-03 NOTE — Telephone Encounter (Signed)
PCP told patient her cholesterol was too low.I have requested labs done on 12/30/16 to show to Dr.Lawrence. I told patient  That after review of labs, we will cal her back.

## 2017-01-03 NOTE — Telephone Encounter (Signed)
Please call pt concerning her atorvastatin (LIPITOR) 40 MG tablet [[phone]]

## 2017-01-05 NOTE — Telephone Encounter (Signed)
Patient notified and instructed to sat on Atorvastatin 40 mg,I left message with Dr.Shah's nurse Rosey Batheresa with Dr.lawrences's recommendations.

## 2017-01-05 NOTE — Telephone Encounter (Signed)
Dr.lawrence reviewed patient's lipid results, and wants patient to stay ay atorvastatin 40 mg daily.Accordining to new guidelines,she states an LDL of 46 is desireable

## 2017-01-23 ENCOUNTER — Ambulatory Visit: Payer: MEDICARE | Admitting: Internal Medicine

## 2017-02-07 DIAGNOSIS — K5732 Diverticulitis of large intestine without perforation or abscess without bleeding: Secondary | ICD-10-CM | POA: Diagnosis not present

## 2017-02-07 DIAGNOSIS — Z6829 Body mass index (BMI) 29.0-29.9, adult: Secondary | ICD-10-CM | POA: Diagnosis not present

## 2017-02-07 DIAGNOSIS — Z299 Encounter for prophylactic measures, unspecified: Secondary | ICD-10-CM | POA: Diagnosis not present

## 2017-02-07 DIAGNOSIS — E7800 Pure hypercholesterolemia, unspecified: Secondary | ICD-10-CM | POA: Diagnosis not present

## 2017-02-07 DIAGNOSIS — I2510 Atherosclerotic heart disease of native coronary artery without angina pectoris: Secondary | ICD-10-CM | POA: Diagnosis not present

## 2017-02-07 DIAGNOSIS — J449 Chronic obstructive pulmonary disease, unspecified: Secondary | ICD-10-CM | POA: Diagnosis not present

## 2017-02-07 DIAGNOSIS — B3789 Other sites of candidiasis: Secondary | ICD-10-CM | POA: Diagnosis not present

## 2017-02-09 ENCOUNTER — Other Ambulatory Visit: Payer: Self-pay

## 2017-02-09 ENCOUNTER — Emergency Department (HOSPITAL_COMMUNITY): Payer: MEDICARE

## 2017-02-09 ENCOUNTER — Encounter (HOSPITAL_COMMUNITY): Payer: Self-pay | Admitting: *Deleted

## 2017-02-09 ENCOUNTER — Observation Stay (HOSPITAL_COMMUNITY)
Admission: EM | Admit: 2017-02-09 | Discharge: 2017-02-10 | Disposition: A | Discharge status: Home / Self Care | Payer: MEDICARE | Attending: Internal Medicine | Admitting: Internal Medicine

## 2017-02-09 DIAGNOSIS — Z79899 Other long term (current) drug therapy: Secondary | ICD-10-CM | POA: Diagnosis not present

## 2017-02-09 DIAGNOSIS — K5793 Diverticulitis of intestine, part unspecified, without perforation or abscess with bleeding: Secondary | ICD-10-CM | POA: Insufficient documentation

## 2017-02-09 DIAGNOSIS — Z7901 Long term (current) use of anticoagulants: Secondary | ICD-10-CM | POA: Insufficient documentation

## 2017-02-09 DIAGNOSIS — I25110 Atherosclerotic heart disease of native coronary artery with unstable angina pectoris: Secondary | ICD-10-CM | POA: Diagnosis not present

## 2017-02-09 DIAGNOSIS — I200 Unstable angina: Secondary | ICD-10-CM | POA: Diagnosis present

## 2017-02-09 DIAGNOSIS — K219 Gastro-esophageal reflux disease without esophagitis: Secondary | ICD-10-CM | POA: Diagnosis present

## 2017-02-09 DIAGNOSIS — J449 Chronic obstructive pulmonary disease, unspecified: Secondary | ICD-10-CM | POA: Diagnosis present

## 2017-02-09 DIAGNOSIS — R072 Precordial pain: Principal | ICD-10-CM | POA: Insufficient documentation

## 2017-02-09 DIAGNOSIS — F17210 Nicotine dependence, cigarettes, uncomplicated: Secondary | ICD-10-CM | POA: Diagnosis not present

## 2017-02-09 DIAGNOSIS — K5792 Diverticulitis of intestine, part unspecified, without perforation or abscess without bleeding: Secondary | ICD-10-CM | POA: Diagnosis present

## 2017-02-09 DIAGNOSIS — Z7982 Long term (current) use of aspirin: Secondary | ICD-10-CM | POA: Insufficient documentation

## 2017-02-09 DIAGNOSIS — I25119 Atherosclerotic heart disease of native coronary artery with unspecified angina pectoris: Secondary | ICD-10-CM | POA: Diagnosis present

## 2017-02-09 DIAGNOSIS — R079 Chest pain, unspecified: Secondary | ICD-10-CM | POA: Diagnosis not present

## 2017-02-09 LAB — CBC
HCT: 48.1 % — ABNORMAL HIGH (ref 36.0–46.0)
HEMATOCRIT: 47 % — AB (ref 36.0–46.0)
HEMOGLOBIN: 15.1 g/dL — AB (ref 12.0–15.0)
Hemoglobin: 14.6 g/dL (ref 12.0–15.0)
MCH: 31 pg (ref 26.0–34.0)
MCH: 31.6 pg (ref 26.0–34.0)
MCHC: 31.1 g/dL (ref 30.0–36.0)
MCHC: 31.4 g/dL (ref 30.0–36.0)
MCV: 99.8 fL (ref 78.0–100.0)
MCV: 100.6 fL — AB (ref 78.0–100.0)
PLATELETS: 217 10*3/uL (ref 150–400)
Platelets: 228 10*3/uL (ref 150–400)
RBC: 4.71 MIL/uL (ref 3.87–5.11)
RBC: 4.78 MIL/uL (ref 3.87–5.11)
RDW: 13.2 % (ref 11.5–15.5)
RDW: 13.4 % (ref 11.5–15.5)
WBC: 8.6 10*3/uL (ref 4.0–10.5)
WBC: 8.3 10*3/uL (ref 4.0–10.5)

## 2017-02-09 LAB — BASIC METABOLIC PANEL
Anion gap: 6 (ref 5–15)
BUN: 14 mg/dL (ref 6–20)
CHLORIDE: 104 mmol/L (ref 101–111)
CO2: 28 mmol/L (ref 22–32)
CREATININE: 0.7 mg/dL (ref 0.44–1.00)
Calcium: 9.1 mg/dL (ref 8.9–10.3)
GFR calc non Af Amer: >60 mL/min (ref >60)
Glucose, Bld: 104 mg/dL — ABNORMAL HIGH (ref 65–99)
POTASSIUM: 3.6 mmol/L (ref 3.5–5.1)
Sodium: 138 mmol/L (ref 135–145)

## 2017-02-09 LAB — TROPONIN I
Troponin I: <0.03 ng/mL (ref <0.03)
Troponin I: <0.03 ng/mL (ref <0.03)

## 2017-02-09 LAB — CREATININE, SERUM
Creatinine, Ser: 0.61 mg/dL (ref 0.44–1.00)
GFR calc Af Amer: >60 mL/min (ref >60)
GFR calc non Af Amer: >60 mL/min (ref >60)

## 2017-02-09 MED ORDER — METRONIDAZOLE 500 MG PO TABS
500.0000 mg | ORAL_TABLET | Freq: Three times a day (TID) | ORAL | Status: DC
  Administered 2017-02-10: 500 mg via ORAL
  Filled 2017-02-09: qty 1

## 2017-02-09 MED ORDER — ONDANSETRON HCL 4 MG/2ML IJ SOLN
4.0000 mg | Freq: Four times a day (QID) | INTRAMUSCULAR | Status: DC | PRN
  Administered 2017-02-10: 4 mg via INTRAVENOUS
  Filled 2017-02-09: qty 2

## 2017-02-09 MED ORDER — PANTOPRAZOLE SODIUM 40 MG PO TBEC
40.0000 mg | DELAYED_RELEASE_TABLET | Freq: Every day | ORAL | Status: DC
  Administered 2017-02-09 – 2017-02-10 (×2): 40 mg via ORAL
  Filled 2017-02-09 (×2): qty 1

## 2017-02-09 MED ORDER — CIPROFLOXACIN HCL 250 MG PO TABS
500.0000 mg | ORAL_TABLET | Freq: Two times a day (BID) | ORAL | Status: DC
  Administered 2017-02-10: 500 mg via ORAL
  Filled 2017-02-09: qty 2

## 2017-02-09 MED ORDER — ALBUTEROL SULFATE (2.5 MG/3ML) 0.083% IN NEBU: 3.0000 mL | INHALATION_SOLUTION | Freq: Four times a day (QID) | RESPIRATORY_TRACT | Status: DC | PRN

## 2017-02-09 MED ORDER — MORPHINE SULFATE (PF) 2 MG/ML IV SOLN: 2.0000 mg | INTRAVENOUS | Status: DC | PRN

## 2017-02-09 MED ORDER — NITROGLYCERIN 0.4 MG SL SUBL: 0.4000 mg | SUBLINGUAL_TABLET | SUBLINGUAL | Status: DC | PRN

## 2017-02-09 MED ORDER — ENOXAPARIN SODIUM 40 MG/0.4ML ~~LOC~~ SOLN
40.0000 mg | SUBCUTANEOUS | Status: DC
  Filled 2017-02-09: qty 0.4

## 2017-02-09 MED ORDER — MOMETASONE FURO-FORMOTEROL FUM 200-5 MCG/ACT IN AERO
2.0000 | INHALATION_SPRAY | Freq: Two times a day (BID) | RESPIRATORY_TRACT | Status: DC
  Filled 2017-02-09 (×2): qty 8.8

## 2017-02-09 MED ORDER — ASPIRIN EC 81 MG PO TBEC
81.0000 mg | DELAYED_RELEASE_TABLET | Freq: Every day | ORAL | Status: DC
  Administered 2017-02-09 – 2017-02-10 (×2): 81 mg via ORAL
  Filled 2017-02-09 (×2): qty 1

## 2017-02-09 MED ORDER — ATORVASTATIN CALCIUM 40 MG PO TABS: 40.0000 mg | ORAL_TABLET | Freq: Every day | ORAL | Status: DC

## 2017-02-09 MED ORDER — ACETAMINOPHEN 325 MG PO TABS: 650.0000 mg | ORAL_TABLET | ORAL | Status: DC | PRN

## 2017-02-09 MED ORDER — CLOPIDOGREL BISULFATE 75 MG PO TABS
75.0000 mg | ORAL_TABLET | Freq: Every day | ORAL | Status: DC
  Administered 2017-02-09 – 2017-02-10 (×2): 75 mg via ORAL
  Filled 2017-02-09 (×2): qty 1

## 2017-02-09 NOTE — ED Triage Notes (Addendum)
Pt c/o intermittent mid chest pain that started yesterday. Pt took Nitro and Tramadol with relief of pain yesterday. Pt woke up again this morning with chest pain. Denies SOB, n/v. Pt reports some dizziness. Pt reports radiation of pain yesterday up into the left side of neck, denies radiation of pain today.

## 2017-02-09 NOTE — ED Provider Notes (Signed)
[facility] EMERGENCY DEPARTMENT Provider Note   CSN: [id number] Arrival date & time: 02/09/17  1239     History   Chief Complaint Chief Complaint  Patient presents with  . Chest Pain    HPI Lisa [name] is a 71 y.o. female.  Patient with onset of chest pain yesterday.  Today it restarted at 1130.  Yesterday the pain was midsternal area radiated to her left neck and left arm.  Today the pain is remained in the substernal area.  It comes and goes.  Currently no pain right now but has not been away for very long.  Patient had a stent placed in June.  On Tuesday patient was diagnosed with diverticulitis and started on Cipro and Flagyl.  Patient's cardiologist is Dr. Tenny Craw.  Patient yesterday took nitro with relief.  Today she did take her daily aspirin.      Past Medical History:  Diagnosis Date  . COPD (chronic obstructive pulmonary disease) (HCC)   . Coronary artery disease   . Cough    " ALL MY LIFE "  . Dyspnea   . GERD (gastroesophageal reflux disease)   . NSTEMI (non-ST elevated myocardial infarction) (HCC) 09/01/2016   DES to Cx/OM bifurcation  . Unstable angina (HCC) 08/2016    Patient Active Problem List   Diagnosis Date Noted  . Glaucoma 11/24/2016  . Diverticulitis 10/11/2016  . Coronary artery disease involving native coronary artery of native heart without angina pectoris   . Chest pain 09/30/2016  . Non-ST elevation (NSTEMI) myocardial infarction (HCC) 09/06/2016  . Coronary artery disease involving native coronary artery of native heart with unstable angina pectoris (HCC)   . Hyperlipidemia LDL goal <70   . Hypokalemia   . Unstable angina (HCC) 08/31/2016  . GERD (gastroesophageal reflux disease) 08/31/2016  . COPD (chronic obstructive pulmonary disease) (HCC) 08/31/2016  . Tobacco abuse 08/31/2016  . Chronic cough 08/31/2016    Past Surgical History:  Procedure Laterality Date  . ABDOMINAL HYSTERECTOMY    . CORONARY STENT INTERVENTION   09/05/2016   Successful complex PCI of the circumflex/OM bifurcation using a Synergy DES  . CORONARY STENT INTERVENTION N/A 09/05/2016   Procedure: Coronary Stent Intervention;  Surgeon: Tonny Bollman, MD;  Location: [facility] INVASIVE CV LAB;  Service: Cardiovascular;  Laterality: N/A;  . LEFT HEART CATH AND CORONARY ANGIOGRAPHY N/A 09/02/2016   Procedure: Left Heart Cath and Coronary Angiography;  Surgeon: Corky Crafts, MD;  Location: [facility] INVASIVE CV LAB;  Service: Cardiovascular;  Laterality: N/A;    OB History    Gravida Para Term Preterm AB Living   7 6 6   1 4    SAB TAB Ectopic Multiple Live Births   1               Home Medications    Prior to Admission medications   Medication Sig Start Date End Date Taking? Authorizing Provider  acetaminophen (TYLENOL) 500 MG tablet Take 1,000 mg by mouth every 6 (six) hours as needed for moderate pain.    [provider]  ADVAIR DISKUS 250-50 MCG/DOSE AEPB Inhale 1 puff into the lungs at bedtime as needed (for shortness of breath).  08/11/16   [provider]  aspirin EC 81 MG EC tablet Take 1 tablet (81 mg total) by mouth daily. 09/07/16   Duke, Roe Rutherford, PA  atorvastatin (LIPITOR) 40 MG tablet Take 1 tablet (40 mg total) by mouth daily at 6 PM. 11/25/16   Elliot Cousin, MD  Chlorpheniramine Maleate (ALLERGY PO) Take 1 tablet by mouth daily. Equate brand    [provider]  Cholecalciferol (VITAMIN D3) 2000 units TABS Take 2,000 Units by mouth daily.    [provider]  Cinnamon 500 MG capsule Take 1,000 mg by mouth daily.    [provider]  clopidogrel (PLAVIX) 75 MG tablet Take 75 mg by mouth daily.    [provider]  esomeprazole (NEXIUM) 40 MG capsule Take 40 mg by mouth daily. 08/17/16   [provider]  Glucosamine-Chondroitin (MOVE FREE PO) Take 1 tablet by mouth daily.    [provider]  nitroGLYCERIN (NITROSTAT) 0.4 MG SL tablet Place 1 tablet (0.4 mg total)  under the tongue every 5 (five) minutes as needed for chest pain. 09/06/16   Duke, Roe RutherfordAngela Nicole, PA  Omega-3 Fatty Acids (FISH OIL OMEGA-3 PO) Take 1 capsule by mouth daily.    [provider]  Probiotic Product (PROBIOTIC PO) Take 1 capsule by mouth daily.    [provider]  VENTOLIN HFA 108 (90 Base) MCG/ACT inhaler Inhale 2 puffs into the lungs every 6 (six) hours as needed for wheezing or shortness of breath.  08/17/16   [provider]  vitamin B-12 (CYANOCOBALAMIN) 500 MCG tablet Take 500 mcg by mouth daily.    [provider]  vitamin C (ASCORBIC ACID) 500 MG tablet Take 500 mg by mouth daily.    [provider]    Family History Family History  Problem Relation Age of Onset  . Asthma Mother   . Heart attack Father 7345  . Heart attack Son 48  . Prostate cancer Brother     Social History Social History   Tobacco Use  . Smoking status: Current Some Day Smoker    Packs/day: 0.50    Years: 50.00    Pack years: 25.00    Types: Cigarettes  . Smokeless tobacco: Never Used  Substance Use Topics  . Alcohol use: Yes    Comment: occas  . Drug use: No     Allergies   Brilinta [ticagrelor]; Penicillins; Ranexa [ranolazine]; and Sulfa antibiotics   Review of Systems Review of Systems  Constitutional: Negative for fever.  HENT: Negative for congestion.   Eyes: Negative for redness.  Respiratory: Negative for shortness of breath.   Cardiovascular: Positive for chest pain.  Gastrointestinal: Positive for abdominal pain. Negative for nausea and vomiting.  Genitourinary: Negative for dysuria.  Musculoskeletal: Negative for back pain.  Skin: Negative for rash.  Neurological: Negative for syncope and headaches.  Hematological: Does not bruise/bleed easily.  Psychiatric/Behavioral: Negative for confusion.     Physical Exam Updated Vital Signs BP (!) 168/76   Pulse 63   Temp 97.6 F (36.4 C) (Oral)   Resp 17   Ht 1.689 m (5'  6.5")   Wt 78 kg (172 lb)   LMP 09/01/2016   SpO2 98%   BMI 27.35 kg/m   Physical Exam  Constitutional: She is oriented to person, place, and time. She appears well-developed and well-nourished. No distress.  HENT:  Head: Normocephalic and atraumatic.  Mouth/Throat: Oropharynx is clear and moist.  Eyes: Conjunctivae and EOM are normal. Pupils are equal, round, and reactive to light.  Neck: Normal range of motion. Neck supple.  Cardiovascular: Normal rate, regular rhythm and normal heart sounds.  Pulmonary/Chest: Effort normal and breath sounds normal.  Abdominal: Soft. Bowel sounds are normal. There is no tenderness.  Musculoskeletal: Normal range of motion. She exhibits no  edema.  Neurological: She is alert and oriented to person, place, and time. No cranial nerve deficit. She exhibits normal muscle tone. Coordination normal.  Skin: Skin is warm. No rash noted.  Nursing note and vitals reviewed.    ED Treatments / Results  Labs (all labs ordered are listed, but only abnormal results are displayed) Labs Reviewed  BASIC METABOLIC PANEL - Abnormal; Notable for the following components:      Result Value   Glucose, Bld 104 (*)    All other components within normal limits  CBC - Abnormal; Notable for the following components:   HCT 47.0 (*)    All other components within normal limits  TROPONIN I    EKG  EKG Interpretation None      ED ECG REPORT   Date: 02/09/2017  Rate: 74  Rhythm: normal sinus rhythm  QRS Axis: normal  Intervals: normal  ST/T Wave abnormalities: normal  Conduction Disutrbances:none  Narrative Interpretation:   Old EKG Reviewed: none available  I have personally reviewed the EKG tracing and agree with the computerized printout as noted. Does show evidence of a septal infarct age undetermined.  This may be related to the time when she had her stent placed.     Radiology Dg Chest 2 View  Result Date: 02/09/2017 CLINICAL DATA:  Chest pain  EXAM: CHEST  2 VIEW COMPARISON:  11/24/2016 FINDINGS: The lungs are clear without focal pneumonia, edema, pneumothorax or pleural effusion. The cardiopericardial silhouette is within normal limits for size. The visualized bony structures of the thorax are intact. IMPRESSION: No active cardiopulmonary disease. Electronically Signed   By: Kennith CenterEric  Mansell M.D.   On: 02/09/2017 13:04    Procedures Procedures (including critical care time)  Medications Ordered in ED Medications - No data to display   Initial Impression / Assessment and Plan / ED Course  I have reviewed the triage vital signs and the nursing notes.  Pertinent labs & imaging results that were available during my care of the patient were reviewed by me and considered in my medical decision making (see chart for details).    Patient has known history of coronary artery disease.  Today's workup negative troponin negative so no acute cardiac event yesterday.  EKG without any acute changes.  However patient has intermittent pain today comes and goes.  So will require admission and monitoring.  Chest x-ray negative for congestive heart failure or pneumothorax.  As stated first troponin negative.  Other labs without significant abnormalities.  Patient stated June but it appears from the notes that the actual event may have been in July.  For the non-STEMI and stent placement.  Final Clinical Impressions(s) / ED Diagnoses   Final diagnoses:  Precordial pain    ED Discharge Orders    None       Vanetta MuldersZackowski, Jahmez Bily, MD 02/09/17 1704

## 2017-02-09 NOTE — ED Notes (Signed)
Pt refused Lovenox and states it makes her stomach sore and it can make you hemorrhage, educated pt and explained risk, pt still refuses.    Pt also refused her atorvastatin stating her cholesterol is that of a 71 year old and she only takes it occasionally, explained risk to pt and pt still refuses

## 2017-02-09 NOTE — H&P (Signed)
History and Physical    Lisa [name] ZOX:[id number] DOB: 1945/04/29 DOA: 02/09/2017  PCP: Kirstie Peri, MD   Patient coming from: Home  Chief Complaint: Chest pain  HPI: Lisa [name] is a 71 y.o. female with medical history significant for CAD and drug-eluting stent placement on 07/2016, recent diagnosis of diverticulitis on ciprofloxacin and Flagyl, COPD, and GERD who presented to the emergency department for substernal chest pain that began yesterday.  She states that the pain is substernal and radiates to the back.  She states that yesterday it went into her left neck and shoulder, but did not do this today.  The pain is intermittent and appears to occasionally be associated with activity.  The pain went away with use of her sublingual nitroglycerin yesterday, but then returned at approximately 1130 this morning after she moved some furniture around.  This time she tried taking her nitroglycerin and other pain medications as prescribed for her diverticulitis, but had very little relief.  She presented to the emergency department with her pain and this is currently resolved.  She states that this pain was very similar to when she had her stents placed earlier this summer.  At that time, she also did not have any significant EKG changes or troponin elevations.  Of note, she was last admitted in late September with similar symptoms after her stent placement and was started on Ranexa by Cardiology Dr. Wyline Mood.  She was then taken off the Ranexa due to symptoms of dyspnea related to the medication in early November.  Cardiology had recommended during the previous admission that should she have a recurrence of her pain, that she would likely need a repeat catheterization.  Patient is compliant with taking her dual antiplatelet therapy regimen with aspirin and Plavix.   ED Course: Her vital signs are stable and she appears comfortable.  EKG demonstrates an old septal infarct with no new acute changes.   Troponin is negative and there are no other significant lab abnormalities.  Chest x-ray with no acute findings.  Review of Systems: As per HPI otherwise 10 point review of systems negative.   Past Medical History:  Diagnosis Date  . COPD (chronic obstructive pulmonary disease) (HCC)   . Coronary artery disease   . Cough    " ALL MY LIFE "  . Dyspnea   . GERD (gastroesophageal reflux disease)   . NSTEMI (non-ST elevated myocardial infarction) (HCC) 09/01/2016   DES to Cx/OM bifurcation  . Unstable angina (HCC) 08/2016    Past Surgical History:  Procedure Laterality Date  . ABDOMINAL HYSTERECTOMY    . CORONARY STENT INTERVENTION  09/05/2016   Successful complex PCI of the circumflex/OM bifurcation using a Synergy DES  . CORONARY STENT INTERVENTION N/A 09/05/2016   Procedure: Coronary Stent Intervention;  Surgeon: Tonny Bollman, MD;  Location: [facility] INVASIVE CV LAB;  Service: Cardiovascular;  Laterality: N/A;  . LEFT HEART CATH AND CORONARY ANGIOGRAPHY N/A 09/02/2016   Procedure: Left Heart Cath and Coronary Angiography;  Surgeon: Corky Crafts, MD;  Location: [facility] INVASIVE CV LAB;  Service: Cardiovascular;  Laterality: N/A;     reports that she has been smoking cigarettes.  She has a 25.00 pack-year smoking history. she has never used smokeless tobacco. She reports that she drinks alcohol. She reports that she does not use drugs.  Allergies  Allergen Reactions  . Brilinta [Ticagrelor] Shortness Of Breath  . Penicillins Anaphylaxis    Has patient had a PCN reaction causing immediate rash, facial/tongue/throat swelling,  SOB or lightheadedness with hypotension: Yes Has patient had a PCN reaction causing severe rash involving mucus membranes or skin necrosis: yes Has patient had a PCN reaction that required hospitalization: no Has patient had a PCN reaction occurring within the last 10 years: no If all of the above answers are "NO", then may proceed with Cephalosporin use.   .  Ranexa [Ranolazine] Shortness Of Breath  . Sulfa Antibiotics Other (See Comments)    Hands and feet blistered    Family History  Problem Relation Age of Onset  . Asthma Mother   . Heart attack Father 5045  . Heart attack Son 48  . Prostate cancer Brother     Prior to Admission medications   Medication Sig Start Date End Date Taking? Authorizing Provider  acetaminophen (TYLENOL) 500 MG tablet Take 1,000 mg by mouth every 6 (six) hours as needed for moderate pain.   Yes [provider]  aspirin EC 81 MG EC tablet Take 1 tablet (81 mg total) by mouth daily. 09/07/16  Yes Duke, Roe RutherfordAngela Nicole, PA  atorvastatin (LIPITOR) 40 MG tablet Take 1 tablet (40 mg total) by mouth daily at 6 PM. Patient taking differently: Take 40 mg by mouth daily as needed (takes in the evening).  11/25/16  Yes Elliot CousinFisher, Denise, MD  Chlorpheniramine Maleate (ALLERGY PO) Take 1 tablet by mouth daily. Equate brand   Yes [provider]  Cholecalciferol (VITAMIN D3) 2000 units TABS Take 2,000 Units by mouth daily.   Yes [provider]  Cinnamon 500 MG capsule Take 500 mg by mouth daily.    Yes [provider]  ciprofloxacin (CIPRO) 500 MG tablet Take 500 mg by mouth 2 (two) times daily. 10 day course starting on 02/07/2017   Yes [provider]  clopidogrel (PLAVIX) 75 MG tablet Take 75 mg by mouth daily.   Yes [provider]  esomeprazole (NEXIUM) 40 MG capsule Take 40 mg by mouth daily. 08/17/16  Yes [provider]  fluticasone-salmeterol (ADVAIR HFA) 115-21 MCG/ACT inhaler Inhale 2 puffs into the lungs daily as needed (shortness of breath).   Yes [provider]  metroNIDAZOLE (FLAGYL) 500 MG tablet Take 500 mg by mouth 3 (three) times daily. 10 day course starting on 02/07/2017   Yes [provider]  nitroGLYCERIN (NITROSTAT) 0.4 MG SL tablet Place 1 tablet (0.4 mg total) under the tongue every 5 (five) minutes as needed for chest pain. 09/06/16   Yes Duke, Roe RutherfordAngela Nicole, PA  nystatin (MYCOSTATIN) 100000 UNIT/ML suspension Take 5 mLs by mouth every 6 (six) hours.   Yes [provider]  Omega-3 Fatty Acids (FISH OIL OMEGA-3 PO) Take 1 capsule by mouth daily.   Yes [provider]  Probiotic Product (PROBIOTIC PO) Take 1 capsule by mouth daily.   Yes [provider]  traMADol (ULTRAM) 50 MG tablet Take 50 mg by mouth once as needed for moderate pain.   Yes [provider]  VENTOLIN HFA 108 (90 Base) MCG/ACT inhaler Inhale 2 puffs into the lungs every 6 (six) hours as needed for wheezing or shortness of breath.  08/17/16  Yes [provider]  vitamin B-12 (CYANOCOBALAMIN) 500 MCG tablet Take 500 mcg by mouth daily.   Yes [provider]  vitamin C (ASCORBIC ACID) 500 MG tablet Take 500 mg by mouth daily.   Yes [provider]    Physical Exam: Vitals:   02/09/17 1248 02/09/17 1548 02/09/17 1550  BP: 138/81 (!) 168/76  Pulse: 65  63  Resp: 18 15 17   Temp: 97.6 F (36.4 C)    TempSrc: Oral    SpO2: 96%  98%  Weight: 78 kg (172 lb)    Height: 5' 6.5" (1.689 m)      Constitutional: NAD, calm, comfortable Vitals:   02/09/17 1248 02/09/17 1548 02/09/17 1550  BP: 138/81 (!) 168/76   Pulse: 65  63  Resp: 18 15 17   Temp: 97.6 F (36.4 C)    TempSrc: Oral    SpO2: 96%  98%  Weight: 78 kg (172 lb)    Height: 5' 6.5" (1.689 m)     Eyes: lids and conjunctivae normal ENMT: Mucous membranes are moist.  Neck: normal, supple Respiratory: clear to auscultation bilaterally. Normal respiratory effort. No accessory muscle use.  Cardiovascular: Regular rate and rhythm, no murmurs. No extremity edema. Abdomen: no tenderness, no distention. Bowel sounds positive.  Musculoskeletal:  No joint deformity upper and lower extremities.   Skin: no rashes, lesions, ulcers.  Psychiatric: Normal judgment and insight. Alert and oriented x 3. Normal mood.   Labs on Admission: I have  personally reviewed following labs and imaging studies  CBC: Recent Labs  Lab 02/09/17 1422  WBC 8.6  HGB 14.6  HCT 47.0*  MCV 99.8  PLT 217   Basic Metabolic Panel: Recent Labs  Lab 02/09/17 1422  NA 138  K 3.6  CL 104  CO2 28  GLUCOSE 104*  BUN 14  CREATININE 0.70  CALCIUM 9.1   GFR: Estimated Creatinine Clearance: 66.7 mL/min (by C-G formula based on SCr of 0.7 mg/dL). Liver Function Tests: No results for input(s): AST, ALT, ALKPHOS, BILITOT, PROT, ALBUMIN in the last 168 hours. No results for input(s): LIPASE, AMYLASE in the last 168 hours. No results for input(s): AMMONIA in the last 168 hours. Coagulation Profile: No results for input(s): INR, PROTIME in the last 168 hours. Cardiac Enzymes: Recent Labs  Lab 02/09/17 1422  TROPONINI <0.03   BNP (last 3 results) No results for input(s): PROBNP in the last 8760 hours. HbA1C: No results for input(s): HGBA1C in the last 72 hours. CBG: No results for input(s): GLUCAP in the last 168 hours. Lipid Profile: No results for input(s): CHOL, HDL, LDLCALC, TRIG, CHOLHDL, LDLDIRECT in the last 72 hours. Thyroid Function Tests: No results for input(s): TSH, T4TOTAL, FREET4, T3FREE, THYROIDAB in the last 72 hours. Anemia Panel: No results for input(s): VITAMINB12, FOLATE, FERRITIN, TIBC, IRON, RETICCTPCT in the last 72 hours. Urine analysis:    Component Value Date/Time   COLORURINE YELLOW 09/15/2016 1657   APPEARANCEUR CLEAR 09/15/2016 1657   LABSPEC 1.020 09/15/2016 1657   PHURINE 6.0 09/15/2016 1657   GLUCOSEU NEGATIVE 09/15/2016 1657   HGBUR NEGATIVE 09/15/2016 1657   BILIRUBINUR NEGATIVE 09/15/2016 1657   KETONESUR NEGATIVE 09/15/2016 1657   PROTEINUR NEGATIVE 09/15/2016 1657   UROBILINOGEN 0.2 09/26/2009 1632   NITRITE NEGATIVE 09/15/2016 1657   LEUKOCYTESUR NEGATIVE 09/15/2016 1657    Radiological Exams on Admission: Dg Chest 2 View  Result Date: 02/09/2017 CLINICAL DATA:  Chest pain EXAM: CHEST  2  VIEW COMPARISON:  11/24/2016 FINDINGS: The lungs are clear without focal pneumonia, edema, pneumothorax or pleural effusion. The cardiopericardial silhouette is within normal limits for size. The visualized bony structures of the thorax are intact. IMPRESSION: No active cardiopulmonary disease. Electronically Signed   By: Kennith CenterEric  Mansell M.D.   On: 02/09/2017 13:04    EKG: Independently reviewed.   Assessment/Plan Principal Problem:  Unstable angina (HCC) Active Problems:   GERD (gastroesophageal reflux disease)   COPD (chronic obstructive pulmonary disease) (HCC)   Diverticulitis   Chest pain due to CAD    Intermittent chest pain with likely unstable angina (HEART 5) -In setting of known CAD with prior drug-eluting stent placement Monitor in telemetry Repeat troponins EKG in a.m. Continue aspirin and Plavix Continue statin Nitroglycerin and morphine as needed chest pain Pulse rate currently in the 60s; will hold off on metoprolol Consultation to cardiology for consideration of catheterization  Diverticulitis Continue on oral ciprofloxacin and Flagyl  COPD Breathing treatments as needed Dulera Oxygen as needed  GERD Protonix   DVT prophylaxis: Lovenox Code Status: Full Family Communication: Husband Disposition Plan:Home in 1-2 days Consults called:Cardiology Admission status: Obs; telemetry   Pratik Hoover Brunette DO [facility] Pager [phone]  If 7PM-7AM, please contact night-coverage [email or web address] Password [email or web address]  02/09/2017, 7:08 PM

## 2017-02-10 DIAGNOSIS — K5792 Diverticulitis of intestine, part unspecified, without perforation or abscess without bleeding: Secondary | ICD-10-CM | POA: Diagnosis not present

## 2017-02-10 DIAGNOSIS — J439 Emphysema, unspecified: Secondary | ICD-10-CM

## 2017-02-10 DIAGNOSIS — R0789 Other chest pain: Secondary | ICD-10-CM | POA: Diagnosis not present

## 2017-02-10 DIAGNOSIS — I2510 Atherosclerotic heart disease of native coronary artery without angina pectoris: Secondary | ICD-10-CM | POA: Diagnosis not present

## 2017-02-10 DIAGNOSIS — K219 Gastro-esophageal reflux disease without esophagitis: Secondary | ICD-10-CM

## 2017-02-10 DIAGNOSIS — R072 Precordial pain: Secondary | ICD-10-CM | POA: Diagnosis not present

## 2017-02-10 DIAGNOSIS — I200 Unstable angina: Secondary | ICD-10-CM | POA: Diagnosis not present

## 2017-02-10 LAB — MAGNESIUM: Magnesium: 1.9 mg/dL (ref 1.7–2.4)

## 2017-02-10 LAB — TROPONIN I: Troponin I: <0.03 ng/mL (ref <0.03)

## 2017-02-10 MED ORDER — SODIUM CHLORIDE 0.9 % IV SOLN
INTRAVENOUS | Status: DC
  Administered 2017-02-10: 09:00:00 via INTRAVENOUS

## 2017-02-10 NOTE — Consult Note (Addendum)
CARDIOLOGY CONSULT NOTE    Patient ID: Lisa SimsCarolyn [name]; [id number]; 1945/08/03   Admit date: 02/09/2017 Date of Consult: 02/10/2017  Primary Care Provider: Kirstie PeriShah, Ashish, MD Primary Cardiologist: Dietrich PatesPaula Ross, MD  Patient Profile:   Lisa SimsCarolyn [name] is a 71 y.o. female with a hx of coronary artery disease DES to Cx and OM bifurcation chronic chest pain, with frequent admissions for same, COPD,  GERD, recent diverticulitis, and ongoing tobacco abuse,who is being seen today for the evaluation of chest pain at the request of Dr. [facility]   History of Present Illness:   Ms. Lisa [name] to the emergency room with recurrent substernal chest pain relieved with nitroglycerin.  The patient states that she recently saw her primary care physician who changed her inhalers to a powdered form.  When she started using it she had recurrent chest discomfort.  She states that when she stopped using it she had no further chest discomfort.  States that with her history she became concerned that this was related to her coronary artery disease and presented to the emergency room.  On arrival to the emergency room she was mildly hypertensive with a blood pressure 116/76 heart rate 65 respirations 18 O2 sat 95%.  Troponins have been negative x5.  EKG revealed normal sinus rhythm rate of 62 bpm.  Pertinent labs reveal sodium 138, potassium 3.6, chloride 104, CO2 28, glucose 104, creatinine 0.70.  She was not found to be anemic, have leukocytosis or thrombocytopenia.  EKG revealed no active cardiopulmonary disease.  She was treated with aspirin, atorvastatin, Plavix, Protonix.  She is been pain-free since admission.  Past Medical History:  Diagnosis Date  . COPD (chronic obstructive pulmonary disease) (HCC)   . Coronary artery disease   . Cough    " ALL MY LIFE "  . Dyspnea   . GERD (gastroesophageal reflux disease)   . NSTEMI (non-ST elevated myocardial infarction) (HCC) 09/01/2016   DES to  Cx/OM bifurcation  . Unstable angina (HCC) 08/2016    Past Surgical History:  Procedure Laterality Date  . ABDOMINAL HYSTERECTOMY    . CORONARY STENT INTERVENTION  09/05/2016   Successful complex PCI of the circumflex/OM bifurcation using a Synergy DES  . CORONARY STENT INTERVENTION N/A 09/05/2016   Procedure: Coronary Stent Intervention;  Surgeon: Tonny Bollmanooper, Michael, MD;  [facility];  Service: Cardiovascular;  Laterality: N/A;  . LEFT HEART CATH AND CORONARY ANGIOGRAPHY N/A 09/02/2016   Procedure: Left Heart Cath and Coronary Angiography;  Surgeon: Corky CraftsVaranasi, Jayadeep S, MD;  [facility];  Service: Cardiovascular;  Laterality: N/A;     Home Medications:  Prior to Admission medications   Medication Sig Start Date End Date Taking? Authorizing Provider  acetaminophen (TYLENOL) 500 MG tablet Take 1,000 mg by mouth every 6 (six) hours as needed for moderate pain.   Yes [provider]  aspirin EC 81 MG EC tablet Take 1 tablet (81 mg total) by mouth daily. 09/07/16  Yes Duke, Roe RutherfordAngela Nicole, PA  atorvastatin (LIPITOR) 40 MG tablet Take 1 tablet (40 mg total) by mouth daily at 6 PM. Patient taking differently: Take 40 mg by mouth daily as needed (takes in the evening).  11/25/16  Yes Elliot CousinFisher, Denise, MD  Chlorpheniramine Maleate (ALLERGY PO) Take 1 tablet by mouth daily. Equate brand   Yes [provider]  Cholecalciferol (VITAMIN D3) 2000 units TABS Take 2,000 Units by mouth daily.   Yes [provider]  Cinnamon 500 MG  capsule Take 500 mg by mouth daily.    Yes [provider]  ciprofloxacin (CIPRO) 500 MG tablet Take 500 mg by mouth 2 (two) times daily. 10 day course starting on 02/07/2017   Yes [provider]  clopidogrel (PLAVIX) 75 MG tablet Take 75 mg by mouth daily.   Yes [provider]  esomeprazole (NEXIUM) 40 MG capsule Take 40 mg by mouth daily. 08/17/16  Yes [provider]    fluticasone-salmeterol (ADVAIR HFA) 115-21 MCG/ACT inhaler Inhale 2 puffs into the lungs daily as needed (shortness of breath).   Yes [provider]  metroNIDAZOLE (FLAGYL) 500 MG tablet Take 500 mg by mouth 3 (three) times daily. 10 day course starting on 02/07/2017   Yes [provider]  nitroGLYCERIN (NITROSTAT) 0.4 MG SL tablet Place 1 tablet (0.4 mg total) under the tongue every 5 (five) minutes as needed for chest pain. 09/06/16  Yes Duke, Roe Rutherford, PA  nystatin (MYCOSTATIN) 100000 UNIT/ML suspension Take 5 mLs by mouth every 6 (six) hours.   Yes [provider]  Omega-3 Fatty Acids (FISH OIL OMEGA-3 PO) Take 1 capsule by mouth daily.   Yes [provider]  Probiotic Product (PROBIOTIC PO) Take 1 capsule by mouth daily.   Yes [provider]  traMADol (ULTRAM) 50 MG tablet Take 50 mg by mouth once as needed for moderate pain.   Yes [provider]  VENTOLIN HFA 108 (90 Base) MCG/ACT inhaler Inhale 2 puffs into the lungs every 6 (six) hours as needed for wheezing or shortness of breath.  08/17/16  Yes [provider]  vitamin B-12 (CYANOCOBALAMIN) 500 MCG tablet Take 500 mcg by mouth daily.   Yes [provider]  vitamin C (ASCORBIC ACID) 500 MG tablet Take 500 mg by mouth daily.   Yes [provider]    Inpatient Medications: Scheduled Meds: . aspirin EC  81 mg Oral Daily  . atorvastatin  40 mg Oral q1800  . ciprofloxacin  500 mg Oral BID  . clopidogrel  75 mg Oral Daily  . enoxaparin (LOVENOX) injection  40 mg Subcutaneous Q24H  . metroNIDAZOLE  500 mg Oral Q8H  . mometasone-formoterol  2 puff Inhalation BID  . pantoprazole  40 mg Oral Daily   Continuous Infusions: . sodium chloride 50 mL/hr at 02/10/17 0928   PRN Meds: acetaminophen, albuterol, morphine injection, nitroGLYCERIN, ondansetron (ZOFRAN) IV  Allergies:    Allergies  Allergen Reactions  . Brilinta [Ticagrelor] Shortness Of Breath   . Penicillins Anaphylaxis    Has patient had a PCN reaction causing immediate rash, facial/tongue/throat swelling, SOB or lightheadedness with hypotension: Yes Has patient had a PCN reaction causing severe rash involving mucus membranes or skin necrosis: yes Has patient had a PCN reaction that required hospitalization: no Has patient had a PCN reaction occurring within the last 10 years: no If all of the above answers are "NO", then may proceed with Cephalosporin use.   . Ranexa [Ranolazine] Shortness Of Breath  . Sulfa Antibiotics Other (See Comments)    Hands and feet blistered    Social History:   Social History   Socioeconomic History  . Marital status: Divorced    Spouse name: Not on file  . Number of children: Not on file  . Years of education: Not on file  . Highest education level: Not on file  Social Needs  . Financial resource strain: Not on file  . Food insecurity - worry: Not on file  .  Food insecurity - inability: Not on file  . Transportation needs - medical: Not on file  . Transportation needs - non-medical: Not on file  Occupational History  . Not on file  Tobacco Use  . Smoking status: Current Some Day Smoker    Packs/day: 0.50    Years: 50.00    Pack years: 25.00    Types: Cigarettes  . Smokeless tobacco: Never Used  Substance and Sexual Activity  . Alcohol use: Yes    Comment: occas  . Drug use: No  . Sexual activity: Yes  Other Topics Concern  . Not on file  Social History Narrative  . Not on file    Family History:    Family History  Problem Relation Age of Onset  . Asthma Mother   . Heart attack Father 35  . Heart attack Son 48  . Prostate cancer Brother      ROS:  Please see the history of present illness.  ROS  All other ROS reviewed and negative.     Physical Exam/Data:   Vitals:   02/09/17 2015 02/10/17 0031 02/10/17 0450 02/10/17 0650  BP: 140/75 (!) 108/41 128/66 123/60  Pulse: 68   73  Resp: 18 18  18   Temp: 97.7 F  (36.5 C) (!) 97 F (36.1 C)  (!) 97.4 F (36.3 C)  TempSrc: Oral Oral  Oral  SpO2: 94% 90%  95%  Weight: 178 lb 8 oz (81 kg)     Height: 5' 6.5" (1.689 m)      No intake or output data in the 24 hours ending 02/10/17 0951 Filed Weights   02/09/17 1248 02/09/17 2015  Weight: 172 lb (78 kg) 178 lb 8 oz (81 kg)   Body mass index is 28.38 kg/m.  General:  Well nourished, well developed, in no acute distress HEENT: normal Lymph: no adenopathy Neck: no JVD Endocrine:  No thryomegaly Vascular: No carotid bruits; FA pulses 2+ bilaterally without bruits  Cardiac:  normal S1, S2; RRR;soft 1/6 systolic murmur  Lungs:  clear to auscultation bilaterally, no wheezing, rhonchi or rales  Abd: soft, nontender, no hepatomegaly  Ext: no edema Musculoskeletal:  No deformities, BUE and BLE strength normal and equal Skin: warm and dry  Neuro:  CNs 2-12 intact, no focal abnormalities noted Psych:  Normal affect   EKG:  The EKG was personally reviewed and demonstrates: Normal sinus rhythm, rate 74 bpm. Telemetry:  Telemetry was personally reviewed and demonstrates: Normal sinus rhythm  Relevant CV Studies: Cardiac Cath 09/05/2016  Dominance: Right  Left Circumflex  Dist Cx lesion 90% stenosed  Culprit lesion. The lesion is type C and located at the bifurcation.  Second Obtuse Marginal Branch  Ost 2nd Mrg to 2nd Mrg lesion 90% stenosed  Ost 2nd Mrg to 2nd Mrg lesion.  Right Coronary Artery  Prox RCA to Mid RCA lesion 25% stenosed  Prox RCA to Mid RCA lesion.   Conclusion   Successful complex PCI of the circumflex/OM bifurcation using a Synergy DES  Recommend: ASA/Brilinta at least 12 months. Should be ok for discharge tomorrow if no complications arise.      Laboratory Data:  Chemistry Recent Labs  Lab 02/09/17 1422 02/09/17 2016  NA 138  --   K 3.6  --   CL 104  --   CO2 28  --   GLUCOSE 104*  --   BUN 14  --   CREATININE 0.70 0.61  CALCIUM 9.1  --   GFRNONAA >  60 >60    GFRAA >60 >60  ANIONGAP 6  --     Hematology Recent Labs  Lab 02/09/17 1422 02/09/17 2016  WBC 8.6 8.3  RBC 4.71 4.78  HGB 14.6 15.1*  HCT 47.0* 48.1*  MCV 99.8 100.6*  MCH 31.0 31.6  MCHC 31.1 31.4  RDW 13.2 13.4  PLT 217 228   Cardiac Enzymes Recent Labs  Lab 02/09/17 1422 02/09/17 2016 02/09/17 2338 02/10/17 0200  TROPONINI <0.03 <0.03 <0.03 <0.03   BNPNo results for input(s): BNP, PROBNP in the last 168 hours.  DDimer No results for input(s): DDIMER in the last 168 hours.  Radiology/Studies:  Dg Chest 2 View  Result Date: 02/09/2017 CLINICAL DATA:  Chest pain EXAM: CHEST  2 VIEW COMPARISON:  11/24/2016 FINDINGS: The lungs are clear without focal pneumonia, edema, pneumothorax or pleural effusion. The cardiopericardial silhouette is within normal limits for size. The visualized bony structures of the thorax are intact. IMPRESSION: No active cardiopulmonary disease. Electronically Signed   By: Kennith Center M.D.   On: 02/09/2017 13:04    Assessment and Plan:   1.  Recurrent chest pain: Typical and atypical features associated with using inhaler.  Similar to pain she had prior to stent placement, described as substernal pain radiating to her shoulder.  Pain was relieved with nitroglycerin sublingual.  EKG not reveal any acute ST-T wave abnormalities, troponins are negative x 5 arguing against ACS.  She is currently pain-free.  2.  Coronary artery disease: His recent hernia catheterization completed in July 2018, requiring PCI of the circumflex and OM bifurcation with drug-eluting stent.  She remains on aspirin and Plavix.  Not tolerate Brilinta due to breathing difficulties.  Consideration for re-catheterization if chest pain recurred according to last note.  Will discuss with Dr. Wyline Mood if this is necessary during this admission.  For now continue current medication regimen and statin therapy.  3.  History of COPD: By primary care.  On Advair 2 puffs twice daily as  needed.  She stated that when she uses it she has chest discomfort.  4.  GERD: On PPI.Check Magnesium     For questions or updates, please contact [facility] Please consult [email or web address] for contact info under Cardiology/STEMI.   Signed, Bettey Mare. Liborio Nixon, ANP, AACC  02/10/2017 9:51 AM   Attending note Patient seen and discussed with DNP Lyman Bishop, I agree with her documentation above. 71 yo female history of CAD with complex PCI of LCX/OM bifurcation with DES 08/2016. Brillinta allergy, has had some bradycardia on beta blockers and overall medical therapy has been limited by interminttent low bp's. On and off chest pain since that time, admissions in 09/2016, 10/2016 without objective evidence of ischemia. Had done well since 10/2016 until recent symptoms starting 2 days ago. Currently on ASA 81, atorva 40, plavix 75. We had tried ranexa in 10/2016 but appears to be off at this time, notes indicate she thought it was causing SOB.   Reports chest pain starting 2 days ago after her inhaler was changed by pcp. Aching pain 6-7/10 midchest at rest, no other associated symptoms. Would last for 10-15 minutes then self resolve, recurrent episodes. Some improvement with NG. Has had some cough and wheezing over the last few days.   K 3.6, Cr 0.7, WBC 8.6, Hgb 14.6, Plt 217,  Trop neg x 4 CXR no acute process EKG SR without ischemic changes  Nonspecific recurrent chest pain symptoms with no objective evidence of ischemia by EKG or  enzymes this admission or her 2 prior admissions a few months ago. Active wheezing on exam, may have some COPD component. Antianginal therapy limited by low bp's and side effects to ranexa. Do not feel repeat ischemic testing is warranted at this time. Monitor symptoms, and if progress could consider a repeat cath over time. Consider trial of COPD nebulizers +/- steroids, on my exam active wheezing. We will arrange close outpatient f/u, from cardiac standpoint ok for  discharge.    Dina RichJonathan Branch MD

## 2017-02-10 NOTE — Care Management Obs Status (Signed)
MEDICARE OBSERVATION STATUS NOTIFICATION   Patient Details  Name: Lisa [name] MRN: [id number] Date of Birth: [birth year]   Medicare Observation Status Notification Given:  Yes    Trev Boley, Chrystine OilerSharley Diane, RN 02/10/2017, 1:30 PM

## 2017-02-10 NOTE — Progress Notes (Signed)
Per hospitalist order the patients BID Dulera MDI was unavailable from pharmacy at 0800, 0914 , and 1056.  Upon patient assessment she only uses her Advair PRN at home (usually  only if she has a coughing spell which occurs infrequently). She also mentioned she felt the Advair gave her thrush and felt  that it may have caused some chest pains. Due to the aforementioned issues she is reluctant to use the maintenance inhaler.  The benefits of its consistent use were explained to the patient, she verbalized her understanding. I made her aware of current respiratory therapy orders and she agreed to call if she needed a treatment. Patient's  BBS  clear, slightly congested cough noted and SpO2 92% RA.

## 2017-02-10 NOTE — Progress Notes (Signed)
While sleeping Patient's O2 drops to 88. Placed on 2L [place] PRN. Educated patient on the use of the O2 and explained once she wakes up she may remove.   Genelle Balameron D Karma Hiney, RN

## 2017-02-10 NOTE — Progress Notes (Signed)
EKG completed and placed on chart 

## 2017-02-10 NOTE — Discharge Summary (Signed)
Physician Discharge Summary  Lisa SimsCarolyn Sykora WUJ:[id number] DOB: Apr 20, 1945 DOA: 02/09/2017  PCP: Kirstie PeriShah, Ashish, MD  Admit date: 02/09/2017 Discharge date: 02/10/2017  Admitted From: home Disposition:  home  Recommendations for Outpatient Follow-up:  1. Follow up with PCP in 1-2 weeks 2. Please obtain BMP/CBC in one week 3. Patient will follow up with cardiology as an outpatient  Home Health: Equipment/Devices:  Discharge Condition: stable CODE STATUS: full Diet recommendation: Heart Healthy   Brief/Interim Summary: 71 year old female with a history of COPD, coronary artery disease status post stent in the past, was admitted to the hospital with complaints of chest discomfort.  She ruled out for ACS with negative cardiac markers.  She was seen by cardiology who did not feel that further ischemic testing was necessary at this time.  It was felt that her discomfort was atypical.  Recommendations were for outpatient follow-up.  Patient has transient wheezing which is likely related to her COPD.  At the time of my assessment, she is breathing comfortably, clear without any wheezing.  Patient feels ready for discharge home.  She will follow-up with cardiology as an outpatient.  She has been strongly advised to quit smoking.  She will discuss this further with her primary care physician.  Discharge Diagnoses:  Principal Problem:   Unstable angina (HCC) Active Problems:   GERD (gastroesophageal reflux disease)   COPD (chronic obstructive pulmonary disease) (HCC)   Diverticulitis   Chest pain due to CAD    Discharge Instructions  Discharge Instructions    Diet - low sodium heart healthy   Complete by:  As directed    Increase activity slowly   Complete by:  As directed      Allergies as of 02/10/2017      Reactions   Brilinta [ticagrelor] Shortness Of Breath   Penicillins Anaphylaxis   Has patient had a PCN reaction causing immediate rash, facial/tongue/throat swelling, SOB or  lightheadedness with hypotension: Yes Has patient had a PCN reaction causing severe rash involving mucus membranes or skin necrosis: yes Has patient had a PCN reaction that required hospitalization: no Has patient had a PCN reaction occurring within the last 10 years: no If all of the above answers are "NO", then may proceed with Cephalosporin use.   Ranexa [ranolazine] Shortness Of Breath   Sulfa Antibiotics Other (See Comments)   Hands and feet blistered      Medication List    TAKE these medications   acetaminophen 500 MG tablet Commonly known as:  TYLENOL Take 1,000 mg by mouth every 6 (six) hours as needed for moderate pain.   ALLERGY PO Take 1 tablet by mouth daily. Equate brand   aspirin 81 MG EC tablet Take 1 tablet (81 mg total) by mouth daily.   atorvastatin 40 MG tablet Commonly known as:  LIPITOR Take 1 tablet (40 mg total) by mouth daily at 6 PM. What changed:    when to take this  reasons to take this   Cinnamon 500 MG capsule Take 500 mg by mouth daily.   ciprofloxacin 500 MG tablet Commonly known as:  CIPRO Take 500 mg by mouth 2 (two) times daily. 10 day course starting on 02/07/2017   clopidogrel 75 MG tablet Commonly known as:  PLAVIX Take 75 mg by mouth daily.   esomeprazole 40 MG capsule Commonly known as:  NEXIUM Take 40 mg by mouth daily.   FISH OIL OMEGA-3 PO Take 1 capsule by mouth daily.   fluticasone-salmeterol 115-21 MCG/ACT inhaler Commonly  known as:  ADVAIR HFA Inhale 2 puffs into the lungs daily as needed (shortness of breath).   metroNIDAZOLE 500 MG tablet Commonly known as:  FLAGYL Take 500 mg by mouth 3 (three) times daily. 10 day course starting on 02/07/2017   nitroGLYCERIN 0.4 MG SL tablet Commonly known as:  NITROSTAT Place 1 tablet (0.4 mg total) under the tongue every 5 (five) minutes as needed for chest pain.   nystatin 100000 UNIT/ML suspension Commonly known as:  MYCOSTATIN Take 5 mLs by mouth every 6 (six)  hours.   PROBIOTIC PO Take 1 capsule by mouth daily.   traMADol 50 MG tablet Commonly known as:  ULTRAM Take 50 mg by mouth once as needed for moderate pain.   VENTOLIN HFA 108 (90 Base) MCG/ACT inhaler Generic drug:  albuterol Inhale 2 puffs into the lungs every 6 (six) hours as needed for wheezing or shortness of breath.   vitamin B-12 500 MCG tablet Commonly known as:  CYANOCOBALAMIN Take 500 mcg by mouth daily.   vitamin C 500 MG tablet Commonly known as:  ASCORBIC ACID Take 500 mg by mouth daily.   Vitamin D3 2000 units Tabs Take 2,000 Units by mouth daily.       Allergies  Allergen Reactions  . Brilinta [Ticagrelor] Shortness Of Breath  . Penicillins Anaphylaxis    Has patient had a PCN reaction causing immediate rash, facial/tongue/throat swelling, SOB or lightheadedness with hypotension: Yes Has patient had a PCN reaction causing severe rash involving mucus membranes or skin necrosis: yes Has patient had a PCN reaction that required hospitalization: no Has patient had a PCN reaction occurring within the last 10 years: no If all of the above answers are "NO", then may proceed with Cephalosporin use.   . Ranexa [Ranolazine] Shortness Of Breath  . Sulfa Antibiotics Other (See Comments)    Hands and feet blistered    Consultations:  cardiology   Procedures/Studies: Dg Chest 2 View  Result Date: 02/09/2017 CLINICAL DATA:  Chest pain EXAM: CHEST  2 VIEW COMPARISON:  11/24/2016 FINDINGS: The lungs are clear without focal pneumonia, edema, pneumothorax or pleural effusion. The cardiopericardial silhouette is within normal limits for size. The visualized bony structures of the thorax are intact. IMPRESSION: No active cardiopulmonary disease. Electronically Signed   By: Kennith Center M.D.   On: 02/09/2017 13:04       Subjective: Feeling better, no chest pain or shortness of breath  Discharge Exam: Vitals:   02/10/17 0650 02/10/17 1050  BP: 123/60 (!)  100/52  Pulse: 73 63  Resp: 18 18  Temp: (!) 97.4 F (36.3 C) (!) 97.5 F (36.4 C)  SpO2: 95% 96%   Vitals:   02/10/17 0031 02/10/17 0450 02/10/17 0650 02/10/17 1050  BP: (!) 108/41 128/66 123/60 (!) 100/52  Pulse:   73 63  Resp: 18  18 18   Temp: (!) 97 F (36.1 C)  (!) 97.4 F (36.3 C) (!) 97.5 F (36.4 C)  TempSrc: Oral  Oral Oral  SpO2: 90%  95% 96%  Weight:      Height:        General: Pt is alert, awake, not in acute distress Cardiovascular: RRR, S1/S2 +, no rubs, no gallops Respiratory: CTA bilaterally, no wheezing, no rhonchi Abdominal: Soft, NT, ND, bowel sounds + Extremities: no edema, no cyanosis    The results of significant diagnostics from this hospitalization (including imaging, microbiology, ancillary and laboratory) are listed below for reference.     Microbiology:  No results found for this or any previous visit (from the past 240 hour(s)).   Labs: BNP (last 3 results) Recent Labs    09/01/16 0024 09/30/16 1100  BNP 86.2 72.0   Basic Metabolic Panel: Recent Labs  Lab 02/09/17 1422 02/09/17 2016 02/10/17 1132  NA 138  --   --   K 3.6  --   --   CL 104  --   --   CO2 28  --   --   GLUCOSE 104*  --   --   BUN 14  --   --   CREATININE 0.70 0.61  --   CALCIUM 9.1  --   --   MG  --   --  1.9   Liver Function Tests: No results for input(s): AST, ALT, ALKPHOS, BILITOT, PROT, ALBUMIN in the last 168 hours. No results for input(s): LIPASE, AMYLASE in the last 168 hours. No results for input(s): AMMONIA in the last 168 hours. CBC: Recent Labs  Lab 02/09/17 1422 02/09/17 2016  WBC 8.6 8.3  HGB 14.6 15.1*  HCT 47.0* 48.1*  MCV 99.8 100.6*  PLT 217 228   Cardiac Enzymes: Recent Labs  Lab 02/09/17 1422 02/09/17 2016 02/09/17 2338 02/10/17 0200  TROPONINI <0.03 <0.03 <0.03 <0.03   BNP: Invalid input(s): POCBNP CBG: No results for input(s): GLUCAP in the last 168 hours. D-Dimer No results for input(s): DDIMER in the last 72  hours. Hgb A1c No results for input(s): HGBA1C in the last 72 hours. Lipid Profile No results for input(s): CHOL, HDL, LDLCALC, TRIG, CHOLHDL, LDLDIRECT in the last 72 hours. Thyroid function studies No results for input(s): TSH, T4TOTAL, T3FREE, THYROIDAB in the last 72 hours.  Invalid input(s): FREET3 Anemia work up No results for input(s): VITAMINB12, FOLATE, FERRITIN, TIBC, IRON, RETICCTPCT in the last 72 hours. Urinalysis    Component Value Date/Time   COLORURINE YELLOW 09/15/2016 1657   APPEARANCEUR CLEAR 09/15/2016 1657   LABSPEC 1.020 09/15/2016 1657   PHURINE 6.0 09/15/2016 1657   GLUCOSEU NEGATIVE 09/15/2016 1657   HGBUR NEGATIVE 09/15/2016 1657   BILIRUBINUR NEGATIVE 09/15/2016 1657   KETONESUR NEGATIVE 09/15/2016 1657   PROTEINUR NEGATIVE 09/15/2016 1657   UROBILINOGEN 0.2 09/26/2009 1632   NITRITE NEGATIVE 09/15/2016 1657   LEUKOCYTESUR NEGATIVE 09/15/2016 1657   Sepsis Labs Invalid input(s): PROCALCITONIN,  WBC,  LACTICIDVEN Microbiology No results found for this or any previous visit (from the past 240 hour(s)).   Time coordinating discharge: Over 30 minutes  SIGNED:   Erick BlinksJehanzeb Seab Axel, MD  [facility] 02/10/2017, 2:28 PM Pager   If 7PM-7AM, please contact night-coverage [email or web address] Password [email or web address]

## 2017-02-13 DIAGNOSIS — K219 Gastro-esophageal reflux disease without esophagitis: Secondary | ICD-10-CM | POA: Diagnosis not present

## 2017-02-13 DIAGNOSIS — Z79899 Other long term (current) drug therapy: Secondary | ICD-10-CM | POA: Diagnosis not present

## 2017-02-13 DIAGNOSIS — S80862A Insect bite (nonvenomous), left lower leg, initial encounter: Secondary | ICD-10-CM | POA: Diagnosis not present

## 2017-02-13 DIAGNOSIS — S70362A Insect bite (nonvenomous), left thigh, initial encounter: Secondary | ICD-10-CM | POA: Diagnosis not present

## 2017-02-13 DIAGNOSIS — Z7902 Long term (current) use of antithrombotics/antiplatelets: Secondary | ICD-10-CM | POA: Diagnosis not present

## 2017-02-13 DIAGNOSIS — R002 Palpitations: Secondary | ICD-10-CM | POA: Diagnosis not present

## 2017-02-13 DIAGNOSIS — F17200 Nicotine dependence, unspecified, uncomplicated: Secondary | ICD-10-CM | POA: Diagnosis not present

## 2017-02-14 DIAGNOSIS — Z299 Encounter for prophylactic measures, unspecified: Secondary | ICD-10-CM | POA: Diagnosis not present

## 2017-02-14 DIAGNOSIS — W57XXXA Bitten or stung by nonvenomous insect and other nonvenomous arthropods, initial encounter: Secondary | ICD-10-CM | POA: Diagnosis not present

## 2017-02-14 DIAGNOSIS — Z6828 Body mass index (BMI) 28.0-28.9, adult: Secondary | ICD-10-CM | POA: Diagnosis not present

## 2017-02-14 DIAGNOSIS — I2510 Atherosclerotic heart disease of native coronary artery without angina pectoris: Secondary | ICD-10-CM | POA: Diagnosis not present

## 2017-02-14 DIAGNOSIS — J449 Chronic obstructive pulmonary disease, unspecified: Secondary | ICD-10-CM | POA: Diagnosis not present

## 2017-04-01 ENCOUNTER — Other Ambulatory Visit: Payer: Self-pay

## 2017-04-01 ENCOUNTER — Encounter (HOSPITAL_COMMUNITY): Payer: Self-pay

## 2017-04-01 ENCOUNTER — Emergency Department (HOSPITAL_COMMUNITY)
Admission: EM | Admit: 2017-04-01 | Discharge: 2017-04-01 | Disposition: A | Discharge status: Home / Self Care | Payer: MEDICARE | Attending: Emergency Medicine | Admitting: Emergency Medicine

## 2017-04-01 ENCOUNTER — Emergency Department (HOSPITAL_COMMUNITY): Payer: MEDICARE

## 2017-04-01 DIAGNOSIS — Z7982 Long term (current) use of aspirin: Secondary | ICD-10-CM | POA: Insufficient documentation

## 2017-04-01 DIAGNOSIS — R1030 Lower abdominal pain, unspecified: Secondary | ICD-10-CM | POA: Diagnosis present

## 2017-04-01 DIAGNOSIS — Z7902 Long term (current) use of antithrombotics/antiplatelets: Secondary | ICD-10-CM | POA: Insufficient documentation

## 2017-04-01 DIAGNOSIS — Z79899 Other long term (current) drug therapy: Secondary | ICD-10-CM | POA: Insufficient documentation

## 2017-04-01 DIAGNOSIS — J449 Chronic obstructive pulmonary disease, unspecified: Secondary | ICD-10-CM | POA: Diagnosis not present

## 2017-04-01 DIAGNOSIS — K5792 Diverticulitis of intestine, part unspecified, without perforation or abscess without bleeding: Secondary | ICD-10-CM

## 2017-04-01 DIAGNOSIS — I2510 Atherosclerotic heart disease of native coronary artery without angina pectoris: Secondary | ICD-10-CM | POA: Insufficient documentation

## 2017-04-01 DIAGNOSIS — R197 Diarrhea, unspecified: Secondary | ICD-10-CM | POA: Diagnosis not present

## 2017-04-01 DIAGNOSIS — F17210 Nicotine dependence, cigarettes, uncomplicated: Secondary | ICD-10-CM | POA: Insufficient documentation

## 2017-04-01 DIAGNOSIS — R109 Unspecified abdominal pain: Secondary | ICD-10-CM | POA: Diagnosis not present

## 2017-04-01 LAB — COMPREHENSIVE METABOLIC PANEL
ALT: 22 U/L (ref 14–54)
AST: 21 U/L (ref 15–41)
Albumin: 3.9 g/dL (ref 3.5–5.0)
Alkaline Phosphatase: 80 U/L (ref 38–126)
Anion gap: 10 (ref 5–15)
BUN: 10 mg/dL (ref 6–20)
CHLORIDE: 106 mmol/L (ref 101–111)
CO2: 26 mmol/L (ref 22–32)
Calcium: 9.1 mg/dL (ref 8.9–10.3)
Creatinine, Ser: 0.85 mg/dL (ref 0.44–1.00)
GFR calc Af Amer: >60 mL/min (ref >60)
Glucose, Bld: 112 mg/dL — ABNORMAL HIGH (ref 65–99)
Potassium: 3.3 mmol/L — ABNORMAL LOW (ref 3.5–5.1)
Sodium: 142 mmol/L (ref 135–145)
Total Bilirubin: 0.5 mg/dL (ref 0.3–1.2)
Total Protein: 7.4 g/dL (ref 6.5–8.1)

## 2017-04-01 LAB — URINALYSIS, ROUTINE W REFLEX MICROSCOPIC
Bilirubin Urine: NEGATIVE
GLUCOSE, UA: NEGATIVE mg/dL
HGB URINE DIPSTICK: NEGATIVE
Ketones, ur: NEGATIVE mg/dL
NITRITE: NEGATIVE
PH: 6 (ref 5.0–8.0)
Protein, ur: NEGATIVE mg/dL
SPECIFIC GRAVITY, URINE: 1.004 — AB (ref 1.005–1.030)

## 2017-04-01 LAB — CBC WITH DIFFERENTIAL/PLATELET
Basophils Absolute: 0 10*3/uL (ref 0.0–0.1)
Basophils Relative: 0 %
EOS PCT: 2 %
Eosinophils Absolute: 0.2 10*3/uL (ref 0.0–0.7)
HEMATOCRIT: 42.2 % (ref 36.0–46.0)
HEMOGLOBIN: 13.2 g/dL (ref 12.0–15.0)
LYMPHS ABS: 1.9 10*3/uL (ref 0.7–4.0)
Lymphocytes Relative: 26 %
MCH: 30.7 pg (ref 26.0–34.0)
MCHC: 31.3 g/dL (ref 30.0–36.0)
MCV: 98.1 fL (ref 78.0–100.0)
Monocytes Absolute: 0.7 10*3/uL (ref 0.1–1.0)
Monocytes Relative: 9 %
NEUTROS ABS: 4.5 10*3/uL (ref 1.7–7.7)
Neutrophils Relative %: 63 %
Platelets: 220 10*3/uL (ref 150–400)
RBC: 4.3 MIL/uL (ref 3.87–5.11)
RDW: 14 % (ref 11.5–15.5)
WBC: 7.1 10*3/uL (ref 4.0–10.5)

## 2017-04-01 LAB — TYPE AND SCREEN
ABO/RH(D): O POS
Antibody Screen: NEGATIVE

## 2017-04-01 LAB — LIPASE, BLOOD: Lipase: 28 U/L (ref 11–51)

## 2017-04-01 LAB — CBG MONITORING, ED: GLUCOSE-CAPILLARY: 105 mg/dL — AB (ref 65–99)

## 2017-04-01 MED ORDER — SODIUM CHLORIDE 0.9 % IV SOLN
INTRAVENOUS | Status: DC
  Administered 2017-04-01: 15:00:00 via INTRAVENOUS

## 2017-04-01 MED ORDER — METRONIDAZOLE 500 MG PO TABS: 500.0000 mg | ORAL_TABLET | Freq: Three times a day (TID) | ORAL | 0 refills | Status: DC

## 2017-04-01 MED ORDER — IOPAMIDOL (ISOVUE-300) INJECTION 61%
100.0000 mL | Freq: Once | INTRAVENOUS | Status: AC | PRN
  Administered 2017-04-01: 100 mL via INTRAVENOUS

## 2017-04-01 MED ORDER — TRAMADOL HCL 50 MG PO TABS: 50.0000 mg | ORAL_TABLET | Freq: Four times a day (QID) | ORAL | 0 refills | Status: DC | PRN

## 2017-04-01 MED ORDER — CIPROFLOXACIN HCL 500 MG PO TABS: 500.0000 mg | ORAL_TABLET | Freq: Two times a day (BID) | ORAL | 0 refills | Status: DC

## 2017-04-01 MED ORDER — ONDANSETRON HCL 4 MG/2ML IJ SOLN
4.0000 mg | Freq: Once | INTRAMUSCULAR | Status: AC
  Administered 2017-04-01: 4 mg via INTRAVENOUS
  Filled 2017-04-01: qty 2

## 2017-04-01 MED ORDER — SODIUM CHLORIDE 0.9 % IV BOLUS (SEPSIS)
1000.0000 mL | Freq: Once | INTRAVENOUS | Status: AC
  Administered 2017-04-01: 1000 mL via INTRAVENOUS

## 2017-04-01 MED ORDER — MORPHINE SULFATE (PF) 4 MG/ML IV SOLN
4.0000 mg | Freq: Once | INTRAVENOUS | Status: AC
  Administered 2017-04-01: 4 mg via INTRAVENOUS
  Filled 2017-04-01: qty 1

## 2017-04-01 NOTE — ED Provider Notes (Signed)
  Physical Exam  BP 121/67   Pulse 63   Temp 98 F (36.7 C) (Oral)   Resp 15   Ht 5\' 6"  (1.676 m)   Wt 79.4 kg (175 lb)   LMP 09/01/2016   SpO2 92%   BMI 28.25 kg/m   Physical Exam  ED Course/Procedures   Clinical Course as of Apr 01 1745  Sat Apr 01, 2017  1524 Labs are reassuring.  Pt does have lower abd ttp.  Will ct to evaluate for diverticulitis  [JK]    Clinical Course User Index [JK] Linwood DibblesKnapp, Jon, MD    Procedures  MDM  CT scan shows mild diverticulitis.  Patient has history of same.  Feels if she can manage at home.  Will discharge with some pain controls and antibiotics.  Follow-up with PCP.      Benjiman CorePickering, Alin Hutchins, MD 04/01/17 [phone]

## 2017-04-01 NOTE — ED Triage Notes (Signed)
Patient reports of lower abdominal pain with diarrhea x3-4 days. Denies vomiting. Also states urine has had foul odor noted. Patient also complains of dizziness/blurred vision x1 hour. No deficits noted.

## 2017-04-01 NOTE — ED Notes (Signed)
Phlebotomy at bedside.

## 2017-04-01 NOTE — ED Notes (Signed)
Dr. Ranae PalmsYelverton made aware of patient. No new orders obtained.

## 2017-04-01 NOTE — ED Provider Notes (Signed)
[facility] EMERGENCY DEPARTMENT Provider Note   CSN: [id number] Arrival date & time: 04/01/17  1406     History   Chief Complaint Chief Complaint  Patient presents with  . Abdominal Pain  . Dizziness    HPI Lisa [name] is a 72 y.o. female.  HPI Patient presents to the emergency room for evaluation of abdominal pain, diarrhea and dizziness.  Patient states for the last several days she has had multiple episodes of diarrhea associated with lower abdominal pain.  She is also noticed some blood in her stool.  She also complains of some odor to her urine.  Patient has a history of diverticulitis.  She thinks this might be the source of her pain.  She denies any fever.  No vomiting.  No difficulty with her speech.  Patient states she is also started having some lightheadedness has been ongoing for about an hour.  It does occur while she is sitting. Past Medical History:  Diagnosis Date  . COPD (chronic obstructive pulmonary disease) (HCC)   . Coronary artery disease   . Cough    " ALL MY LIFE "  . Dyspnea   . GERD (gastroesophageal reflux disease)   . NSTEMI (non-ST elevated myocardial infarction) (HCC) 09/01/2016   DES to Cx/OM bifurcation  . Unstable angina (HCC) 08/2016    Patient Active Problem List   Diagnosis Date Noted  . Chest pain due to CAD 02/09/2017  . Glaucoma 11/24/2016  . Diverticulitis 10/11/2016  . Coronary artery disease involving native coronary artery of native heart without angina pectoris   . Chest pain 09/30/2016  . Non-ST elevation (NSTEMI) myocardial infarction (HCC) 09/06/2016  . Coronary artery disease involving native coronary artery of native heart with unstable angina pectoris (HCC)   . Hyperlipidemia LDL goal <70   . Hypokalemia   . Unstable angina (HCC) 08/31/2016  . GERD (gastroesophageal reflux disease) 08/31/2016  . COPD (chronic obstructive pulmonary disease) (HCC) 08/31/2016  . Tobacco abuse 08/31/2016  . Chronic cough 08/31/2016      Past Surgical History:  Procedure Laterality Date  . ABDOMINAL HYSTERECTOMY    . CORONARY STENT INTERVENTION  09/05/2016   Successful complex PCI of the circumflex/OM bifurcation using a Synergy DES  . CORONARY STENT INTERVENTION N/A 09/05/2016   Procedure: Coronary Stent Intervention;  Surgeon: Tonny Bollman, MD;  Location: [facility] INVASIVE CV LAB;  Service: Cardiovascular;  Laterality: N/A;  . LEFT HEART CATH AND CORONARY ANGIOGRAPHY N/A 09/02/2016   Procedure: Left Heart Cath and Coronary Angiography;  Surgeon: Corky Crafts, MD;  Location: [facility] INVASIVE CV LAB;  Service: Cardiovascular;  Laterality: N/A;    OB History    Gravida Para Term Preterm AB Living   7 6 6   1 4    SAB TAB Ectopic Multiple Live Births   1               Home Medications    Prior to Admission medications   Medication Sig Start Date End Date Taking? Authorizing Provider  acetaminophen (TYLENOL) 500 MG tablet Take 1,000 mg by mouth every 6 (six) hours as needed for moderate pain.    [provider]  aspirin EC 81 MG EC tablet Take 1 tablet (81 mg total) by mouth daily. 09/07/16   Duke, Roe Rutherford, PA  atorvastatin (LIPITOR) 40 MG tablet Take 1 tablet (40 mg total) by mouth daily at 6 PM. Patient taking differently: Take 40 mg by mouth daily as needed (takes in the  evening).  11/25/16   Elliot Cousin, MD  Chlorpheniramine Maleate (ALLERGY PO) Take 1 tablet by mouth daily. Equate brand    [provider]  Cholecalciferol (VITAMIN D3) 2000 units TABS Take 2,000 Units by mouth daily.    [provider]  Cinnamon 500 MG capsule Take 500 mg by mouth daily.     [provider]  ciprofloxacin (CIPRO) 500 MG tablet Take 500 mg by mouth 2 (two) times daily. 10 day course starting on 02/07/2017    [provider]  clopidogrel (PLAVIX) 75 MG tablet Take 75 mg by mouth daily.    [provider]  esomeprazole (NEXIUM) 40 MG capsule Take 40 mg by mouth daily.  08/17/16   [provider]  fluticasone-salmeterol (ADVAIR HFA) 115-21 MCG/ACT inhaler Inhale 2 puffs into the lungs daily as needed (shortness of breath).    [provider]  metroNIDAZOLE (FLAGYL) 500 MG tablet Take 500 mg by mouth 3 (three) times daily. 10 day course starting on 02/07/2017    [provider]  nitroGLYCERIN (NITROSTAT) 0.4 MG SL tablet Place 1 tablet (0.4 mg total) under the tongue every 5 (five) minutes as needed for chest pain. 09/06/16   Duke, Roe Rutherford, PA  nystatin (MYCOSTATIN) 100000 UNIT/ML suspension Take 5 mLs by mouth every 6 (six) hours.    [provider]  Omega-3 Fatty Acids (FISH OIL OMEGA-3 PO) Take 1 capsule by mouth daily.    [provider]  Probiotic Product (PROBIOTIC PO) Take 1 capsule by mouth daily.    [provider]  traMADol (ULTRAM) 50 MG tablet Take 50 mg by mouth once as needed for moderate pain.    [provider]  VENTOLIN HFA 108 (90 Base) MCG/ACT inhaler Inhale 2 puffs into the lungs every 6 (six) hours as needed for wheezing or shortness of breath.  08/17/16   [provider]  vitamin B-12 (CYANOCOBALAMIN) 500 MCG tablet Take 500 mcg by mouth daily.    [provider]  vitamin C (ASCORBIC ACID) 500 MG tablet Take 500 mg by mouth daily.    [provider]    Family History Family History  Problem Relation Age of Onset  . Asthma Mother   . Heart attack Father 29  . Heart attack Son 48  . Prostate cancer Brother     Social History Social History   Tobacco Use  . Smoking status: Current Some Day Smoker    Packs/day: 0.50    Years: 50.00    Pack years: 25.00    Types: Cigarettes  . Smokeless tobacco: Never Used  Substance Use Topics  . Alcohol use: Yes    Comment: occas  . Drug use: No     Allergies   Brilinta [ticagrelor]; Penicillins; Ranexa [ranolazine]; and Sulfa antibiotics   Review of Systems Review of Systems  All other systems  reviewed and are negative.    Physical Exam Updated Vital Signs BP (!) 152/82 (BP Location: Right Arm)   Pulse 64   Temp 98 F (36.7 C) (Oral)   Resp 16   Ht 1.676 m (5\' 6" )   Wt 79.4 kg (175 lb)   LMP 09/01/2016   SpO2 96%   BMI 28.25 kg/m   Physical Exam  Constitutional: She is oriented to person, place, and time. She appears well-developed and well-nourished. No distress.  HENT:  Head: Normocephalic and atraumatic.  Right Ear: External ear normal.  Left Ear: External ear normal.  Mouth/Throat: Oropharynx is  clear and moist.  Eyes: Conjunctivae are normal. Right eye exhibits no discharge. Left eye exhibits no discharge. No scleral icterus.  Neck: Neck supple. No tracheal deviation present.  Cardiovascular: Normal rate, regular rhythm and intact distal pulses.  Pulmonary/Chest: Effort normal and breath sounds normal. No stridor. No respiratory distress. She has no wheezes. She has no rales.  Abdominal: Soft. Bowel sounds are normal. She exhibits no distension. There is tenderness in the right lower quadrant. There is no rebound and no guarding. No hernia.  Musculoskeletal: She exhibits no edema or tenderness.  Neurological: She is alert and oriented to person, place, and time. She has normal strength. No cranial nerve deficit (no facial droop, extraocular movements intact, no slurred speech) or sensory deficit. She exhibits normal muscle tone. She displays no seizure activity. Coordination normal.  No pronator drift bilateral upper extrem, able to hold both legs off bed for 5 seconds, sensation intact in all extremities, no visual field cuts, no left or right sided neglect, normal finger-nose exam bilaterally, no nystagmus noted   Skin: Skin is warm and dry. No rash noted.  Psychiatric: She has a normal mood and affect.  Nursing note and vitals reviewed.    ED Treatments / Results  Labs (all labs ordered are listed, but only abnormal results are displayed) Labs Reviewed   CBG MONITORING, ED - Abnormal; Notable for the following components:      Result Value   Glucose-Capillary 105 (*)    All other components within normal limits  COMPREHENSIVE METABOLIC PANEL  LIPASE, BLOOD  CBC WITH DIFFERENTIAL/PLATELET  URINALYSIS, ROUTINE W REFLEX MICROSCOPIC  TYPE AND SCREEN    EKG  EKG Interpretation None       Radiology No results found.  Procedures Procedures (including critical care time)  Medications Ordered in ED Medications  sodium chloride 0.9 % bolus 1,000 mL (not administered)    And  0.9 %  sodium chloride infusion (not administered)  ondansetron (ZOFRAN) injection 4 mg (not administered)  morphine 4 MG/ML injection 4 mg (not administered)     Initial Impression / Assessment and Plan / ED Course  I have reviewed the triage vital signs and the nursing notes.  Pertinent labs & imaging results that were available during my care of the patient were reviewed by me and considered in my medical decision making (see chart for details).  Clinical Course as of Apr 01 1613  Sat Apr 01, 2017  1524 Labs are reassuring.  Pt does have lower abd ttp.  Will ct to evaluate for diverticulitis  [JK]    Clinical Course User Index [JK] Linwood DibblesKnapp, Merrel Crabbe, MD    Dr Rubin PayorPickering will follow up on the results  Final Clinical Impressions(s) / ED Diagnoses   pending   Linwood DibblesKnapp, Kyng Matlock, MD 04/01/17 1615

## 2017-04-01 NOTE — ED Notes (Signed)
Patient transported to CT 

## 2017-04-18 ENCOUNTER — Encounter (HOSPITAL_COMMUNITY): Payer: Self-pay | Admitting: Emergency Medicine

## 2017-04-18 ENCOUNTER — Emergency Department (HOSPITAL_COMMUNITY)
Admission: EM | Admit: 2017-04-18 | Discharge: 2017-04-18 | Disposition: A | Discharge status: Home / Self Care | Payer: MEDICARE | Attending: Emergency Medicine | Admitting: Emergency Medicine

## 2017-04-18 ENCOUNTER — Emergency Department (HOSPITAL_COMMUNITY): Payer: MEDICARE

## 2017-04-18 DIAGNOSIS — R079 Chest pain, unspecified: Secondary | ICD-10-CM

## 2017-04-18 DIAGNOSIS — J449 Chronic obstructive pulmonary disease, unspecified: Secondary | ICD-10-CM | POA: Diagnosis not present

## 2017-04-18 DIAGNOSIS — Z7901 Long term (current) use of anticoagulants: Secondary | ICD-10-CM | POA: Insufficient documentation

## 2017-04-18 DIAGNOSIS — Z79899 Other long term (current) drug therapy: Secondary | ICD-10-CM | POA: Diagnosis not present

## 2017-04-18 DIAGNOSIS — Z7982 Long term (current) use of aspirin: Secondary | ICD-10-CM | POA: Diagnosis not present

## 2017-04-18 DIAGNOSIS — Z955 Presence of coronary angioplasty implant and graft: Secondary | ICD-10-CM | POA: Diagnosis not present

## 2017-04-18 DIAGNOSIS — I2510 Atherosclerotic heart disease of native coronary artery without angina pectoris: Secondary | ICD-10-CM | POA: Diagnosis not present

## 2017-04-18 DIAGNOSIS — R072 Precordial pain: Secondary | ICD-10-CM | POA: Diagnosis not present

## 2017-04-18 DIAGNOSIS — R51 Headache: Secondary | ICD-10-CM | POA: Insufficient documentation

## 2017-04-18 DIAGNOSIS — I252 Old myocardial infarction: Secondary | ICD-10-CM | POA: Diagnosis not present

## 2017-04-18 DIAGNOSIS — F17210 Nicotine dependence, cigarettes, uncomplicated: Secondary | ICD-10-CM | POA: Insufficient documentation

## 2017-04-18 DIAGNOSIS — R519 Headache, unspecified: Secondary | ICD-10-CM

## 2017-04-18 LAB — COMPREHENSIVE METABOLIC PANEL
ALT: 29 U/L (ref 14–54)
ANION GAP: 10 (ref 5–15)
AST: 27 U/L (ref 15–41)
Albumin: 3.8 g/dL (ref 3.5–5.0)
Alkaline Phosphatase: 88 U/L (ref 38–126)
BUN: 10 mg/dL (ref 6–20)
CHLORIDE: 107 mmol/L (ref 101–111)
CO2: 25 mmol/L (ref 22–32)
Calcium: 9.2 mg/dL (ref 8.9–10.3)
Creatinine, Ser: 0.56 mg/dL (ref 0.44–1.00)
GFR calc non Af Amer: >60 mL/min (ref >60)
Glucose, Bld: 120 mg/dL — ABNORMAL HIGH (ref 65–99)
Potassium: 3.9 mmol/L (ref 3.5–5.1)
SODIUM: 142 mmol/L (ref 135–145)
Total Bilirubin: 0.6 mg/dL (ref 0.3–1.2)
Total Protein: 7.3 g/dL (ref 6.5–8.1)

## 2017-04-18 LAB — CBC WITH DIFFERENTIAL/PLATELET
Basophils Absolute: 0 10*3/uL (ref 0.0–0.1)
Basophils Relative: 1 %
EOS ABS: 0.1 10*3/uL (ref 0.0–0.7)
EOS PCT: 2 %
HCT: 47.1 % — ABNORMAL HIGH (ref 36.0–46.0)
Hemoglobin: 14.8 g/dL (ref 12.0–15.0)
LYMPHS ABS: 1.6 10*3/uL (ref 0.7–4.0)
Lymphocytes Relative: 20 %
MCH: 30.8 pg (ref 26.0–34.0)
MCHC: 31.4 g/dL (ref 30.0–36.0)
MCV: 97.9 fL (ref 78.0–100.0)
MONOS PCT: 5 %
Monocytes Absolute: 0.4 10*3/uL (ref 0.1–1.0)
Neutro Abs: 5.8 10*3/uL (ref 1.7–7.7)
Neutrophils Relative %: 72 %
PLATELETS: 240 10*3/uL (ref 150–400)
RBC: 4.81 MIL/uL (ref 3.87–5.11)
RDW: 14 % (ref 11.5–15.5)
WBC: 8 10*3/uL (ref 4.0–10.5)

## 2017-04-18 LAB — TROPONIN I: Troponin I: <0.03 ng/mL (ref <0.03)

## 2017-04-18 MED ORDER — IPRATROPIUM-ALBUTEROL 0.5-2.5 (3) MG/3ML IN SOLN: 3.0000 mL | RESPIRATORY_TRACT | Status: DC

## 2017-04-18 MED ORDER — IPRATROPIUM-ALBUTEROL 0.5-2.5 (3) MG/3ML IN SOLN
3.0000 mL | Freq: Once | RESPIRATORY_TRACT | Status: AC
  Administered 2017-04-18: 3 mL via RESPIRATORY_TRACT
  Filled 2017-04-18: qty 3

## 2017-04-18 MED ORDER — GI COCKTAIL ~~LOC~~
30.0000 mL | Freq: Once | ORAL | Status: DC
  Filled 2017-04-18: qty 30

## 2017-04-18 NOTE — ED Provider Notes (Signed)
[facility] EMERGENCY DEPARTMENT Provider Note   CSN: [id number] Arrival date & time: 04/18/17  0920     History   Chief Complaint Chief Complaint  Patient presents with  . Chest Pain    HPI Lisa [name] is a 72 y.o. female.   Chest Pain   This is a new problem. The current episode started 3 to 5 hours ago. The problem occurs constantly. The problem has been resolved. The pain is present in the substernal region. The pain is mild. The quality of the pain is described as brief and sharp. The pain does not radiate. Associated symptoms include nausea. Pertinent negatives include no abdominal pain, no irregular heartbeat, no malaise/fatigue and no orthopnea. She has tried nitroglycerin for the symptoms. The treatment provided no relief. Risk factors include smoking/tobacco exposure.    Past Medical History:  Diagnosis Date  . COPD (chronic obstructive pulmonary disease) (HCC)   . Coronary artery disease   . Cough    " ALL MY LIFE "  . Dyspnea   . GERD (gastroesophageal reflux disease)   . NSTEMI (non-ST elevated myocardial infarction) (HCC) 09/01/2016   DES to Cx/OM bifurcation  . Unstable angina (HCC) 08/2016    Patient Active Problem List   Diagnosis Date Noted  . Chest pain due to CAD 02/09/2017  . Glaucoma 11/24/2016  . Diverticulitis 10/11/2016  . Coronary artery disease involving native coronary artery of native heart without angina pectoris   . Chest pain 09/30/2016  . Non-ST elevation (NSTEMI) myocardial infarction (HCC) 09/06/2016  . Coronary artery disease involving native coronary artery of native heart with unstable angina pectoris (HCC)   . Hyperlipidemia LDL goal <70   . Hypokalemia   . Unstable angina (HCC) 08/31/2016  . GERD (gastroesophageal reflux disease) 08/31/2016  . COPD (chronic obstructive pulmonary disease) (HCC) 08/31/2016  . Tobacco abuse 08/31/2016  . Chronic cough 08/31/2016    Past Surgical History:  Procedure Laterality Date  .  ABDOMINAL HYSTERECTOMY    . CORONARY STENT INTERVENTION  09/05/2016   Successful complex PCI of the circumflex/OM bifurcation using a Synergy DES  . CORONARY STENT INTERVENTION N/A 09/05/2016   Procedure: Coronary Stent Intervention;  Surgeon: Tonny Bollman, MD;  Location: [facility] INVASIVE CV LAB;  Service: Cardiovascular;  Laterality: N/A;  . LEFT HEART CATH AND CORONARY ANGIOGRAPHY N/A 09/02/2016   Procedure: Left Heart Cath and Coronary Angiography;  Surgeon: Corky Crafts, MD;  Location: [facility] INVASIVE CV LAB;  Service: Cardiovascular;  Laterality: N/A;    OB History    Gravida Para Term Preterm AB Living   7 6 6   1 4    SAB TAB Ectopic Multiple Live Births   1               Home Medications    Prior to Admission medications   Medication Sig Start Date End Date Taking? Authorizing Provider  acetaminophen (TYLENOL) 500 MG tablet Take 1,000 mg by mouth every 6 (six) hours as needed for moderate pain.   Yes [provider]  aspirin EC 81 MG EC tablet Take 1 tablet (81 mg total) by mouth daily. 09/07/16  Yes Duke, Roe Rutherford, PA  atorvastatin (LIPITOR) 40 MG tablet Take 1 tablet (40 mg total) by mouth daily at 6 PM. Patient taking differently: Take 40 mg by mouth daily as needed (takes in the evening).  11/25/16  Yes Elliot Cousin, MD  Chlorpheniramine Maleate (ALLERGY PO) Take 1 tablet by mouth daily. Equate brand  Yes [provider]  Cholecalciferol (VITAMIN D3) 2000 units TABS Take 2,000 Units by mouth daily.   Yes [provider]  Cinnamon 500 MG capsule Take 500 mg by mouth daily.    Yes [provider]  clopidogrel (PLAVIX) 75 MG tablet Take 75 mg by mouth daily.   Yes [provider]  esomeprazole (NEXIUM) 40 MG capsule Take 40 mg by mouth daily. 08/17/16  Yes [provider]  fluticasone-salmeterol (ADVAIR HFA) 115-21 MCG/ACT inhaler Inhale 2 puffs into the lungs daily as needed (shortness of breath).   Yes [provider]  nitroGLYCERIN (NITROSTAT) 0.4 MG SL tablet Place 1 tablet (0.4 mg total) under the tongue every 5 (five) minutes as needed for chest pain. 09/06/16  Yes [facility], Roe RutherfordAngela Nicole, PA  Omega-3 Fatty Acids (FISH OIL OMEGA-3 PO) Take 1 capsule by mouth daily.   Yes [provider]  Probiotic Product (PROBIOTIC PO) Take 1 capsule by mouth daily.   Yes [provider]  traMADol (ULTRAM) 50 MG tablet Take 1 tablet (50 mg total) by mouth every 6 (six) hours as needed. 04/01/17  Yes Benjiman CorePickering, Nathan, MD  VENTOLIN HFA 108 [phone](90 Base) MCG/ACT inhaler Inhale 2 puffs into the lungs every 6 (six) hours as needed for wheezing or shortness of breath.  08/17/16  Yes [provider]  vitamin B-12 (CYANOCOBALAMIN) 500 MCG tablet Take 500 mcg by mouth daily.   Yes [provider]  vitamin C (ASCORBIC ACID) 500 MG tablet Take 500 mg by mouth daily.   Yes [provider]    Family History Family History  Problem Relation Age of Onset  . Asthma Mother   . Heart attack Father [id number]  . Heart attack Son 48  . Prostate cancer Brother     Social History Social History   Tobacco Use  . Smoking status: Current Some Day Smoker    Packs/day: 0.50    Years: 50.00    Pack years: 25.00    Types: Cigarettes  . Smokeless tobacco: Never Used  Substance Use Topics  . Alcohol use: Yes    Comment: occas  . Drug use: No     Allergies   Brilinta [ticagrelor]; Penicillins; Ranexa [ranolazine]; and Sulfa antibiotics   Review of Systems Review of Systems  Constitutional: Negative for malaise/fatigue.  Cardiovascular: Positive for chest pain. Negative for orthopnea.  Gastrointestinal: Positive for nausea. Negative for abdominal pain.  All other systems reviewed and are negative.    Physical Exam Updated Vital Signs BP (!) 119/106   Pulse 64   Temp 98 F (36.7 C) (Oral)   Resp 15   Ht 5\' 6"  (1.676 m)   Wt 79.4 kg (175 lb)   LMP 09/01/2016   SpO2 97%   BMI  28.25 kg/m   Physical Exam  Constitutional: She appears well-developed and well-nourished.  HENT:  Head: Normocephalic and atraumatic.  Eyes: EOM are normal. Pupils are equal, round, and reactive to light.  Neck: Normal range of motion.  Cardiovascular: Normal rate, regular rhythm and normal pulses.  Pulmonary/Chest: No stridor. No respiratory distress. She has no decreased breath sounds.  Abdominal: Soft. She exhibits no distension.  Musculoskeletal:       Right lower leg: Normal.       Left lower leg: Normal.  Neurological: She is alert.  Skin: Skin is warm and dry.  Nursing note and vitals reviewed.    ED Treatments / Results  Labs (all labs ordered are listed,  but only abnormal results are displayed) Labs Reviewed  CBC WITH DIFFERENTIAL/PLATELET - Abnormal; Notable for the following components:      Result Value   HCT 47.1 (*)    All other components within normal limits  COMPREHENSIVE METABOLIC PANEL - Abnormal; Notable for the following components:   Glucose, Bld 120 (*)    All other components within normal limits  TROPONIN I    EKG  EKG Interpretation None       Radiology Dg Chest 2 View  Result Date: 04/18/2017 CLINICAL DATA:  Chest pain. EXAM: CHEST  2 VIEW COMPARISON:  02/09/2017. FINDINGS: Mediastinum hilar structures normal. Lungs are clear. No pleural effusion or pneumothorax. Stable cardiomegaly. No acute bony abnormality. IMPRESSION: No acute cardiopulmonary disease. Electronically Signed   By: Maisie Fus  Register   On: 04/18/2017 10:22   Ct Head Wo Contrast  Result Date: 04/18/2017 CLINICAL DATA:  72 year old female with 2 days of left side headache. No known injury. EXAM: CT HEAD WITHOUT CONTRAST TECHNIQUE: Contiguous axial images were obtained from the base of the skull through the vertex without intravenous contrast. COMPARISON:  None. FINDINGS: Brain: Cerebral volume is within normal limits for age. No midline shift, ventriculomegaly, mass effect,  evidence of mass lesion, intracranial hemorrhage or evidence of cortically based acute infarction. Gray-white matter differentiation is within normal limits throughout the brain. No cortical encephalomalacia. Vascular: Calcified atherosclerosis at the skull base. No suspicious intracranial vascular hyperdensity. Skull: No acute osseous abnormality identified. Hyperostosis frontalis. Incidental left posterior midline occipital arachnoid granulations. Sinuses/Orbits: The visible paranasal sinuses, tympanic cavities, and mastoids are clear. Other: No acute orbit or scalp soft tissue findings identified. There is a small 3-4 mm calcified or metal density retained foreign body in the right suboccipital scalp soft tissues on series 3, image 12. Scattered small subcutaneous and/or dermal cysts are noted. IMPRESSION: Normal for age non contrast CT appearance of the brain. Electronically Signed   By: Odessa Fleming M.D.   On: 04/18/2017 11:33    Procedures Procedures (including critical care time)   Counseled patient for approximately 6 minutes regarding smoking cessation. Discussed risks of smoking and how they applied and affected their visit here today. Patient not ready to quit at this time, however will follow up with their primary doctor when they are.   CPT code: 16109: intermediate counseling for smoking cessation   Medications Ordered in ED Medications  gi cocktail (Maalox,Lidocaine,Donnatal) (not administered)  ipratropium-albuterol (DUONEB) 0.5-2.5 (3) MG/3ML nebulizer solution 3 mL (3 mLs Nebulization Given 04/18/17 1033)     Initial Impression / Assessment and Plan / ED Course  I have reviewed the triage vital signs and the nursing notes.  Pertinent labs & imaging results that were available during my care of the patient were reviewed by me and considered in my medical decision making (see chart for details).     Specific chest pain is been going on persistently for over 3 hours and negative  troponins so I doubt ACS.  No vital sign abnormalities to suspect PE.  Could be reflux.  Stable for discharge to follow-up with primary doctor.  Final Clinical Impressions(s) / ED Diagnoses   Final diagnoses:  Nonspecific chest pain  Nonintractable headache, unspecified chronicity pattern, unspecified headache type     Marily Memos, MD 04/18/17 1540

## 2017-04-18 NOTE — ED Triage Notes (Signed)
Pt reports chest pain starting at 0630 this morning.  Nonradiating.  States intermittent dizziness.  Took Nitro x3 prior to arrival with no change in pain.

## 2017-04-18 NOTE — ED Notes (Signed)
Patient transported to X-ray 

## 2017-04-24 DIAGNOSIS — Z6828 Body mass index (BMI) 28.0-28.9, adult: Secondary | ICD-10-CM | POA: Diagnosis not present

## 2017-04-24 DIAGNOSIS — Z299 Encounter for prophylactic measures, unspecified: Secondary | ICD-10-CM | POA: Diagnosis not present

## 2017-04-24 DIAGNOSIS — K219 Gastro-esophageal reflux disease without esophagitis: Secondary | ICD-10-CM | POA: Diagnosis not present

## 2017-04-24 DIAGNOSIS — F17210 Nicotine dependence, cigarettes, uncomplicated: Secondary | ICD-10-CM | POA: Diagnosis not present

## 2017-04-24 DIAGNOSIS — J449 Chronic obstructive pulmonary disease, unspecified: Secondary | ICD-10-CM | POA: Diagnosis not present

## 2017-04-24 DIAGNOSIS — I2510 Atherosclerotic heart disease of native coronary artery without angina pectoris: Secondary | ICD-10-CM | POA: Diagnosis not present

## 2017-05-15 DIAGNOSIS — M25551 Pain in right hip: Secondary | ICD-10-CM | POA: Diagnosis not present

## 2017-05-15 DIAGNOSIS — M25552 Pain in left hip: Secondary | ICD-10-CM | POA: Diagnosis not present

## 2017-05-15 DIAGNOSIS — Z6828 Body mass index (BMI) 28.0-28.9, adult: Secondary | ICD-10-CM | POA: Diagnosis not present

## 2017-05-15 DIAGNOSIS — Z299 Encounter for prophylactic measures, unspecified: Secondary | ICD-10-CM | POA: Diagnosis not present

## 2017-05-15 DIAGNOSIS — M160 Bilateral primary osteoarthritis of hip: Secondary | ICD-10-CM | POA: Diagnosis not present

## 2017-05-15 DIAGNOSIS — B3781 Candidal esophagitis: Secondary | ICD-10-CM | POA: Diagnosis not present

## 2017-07-03 DIAGNOSIS — J449 Chronic obstructive pulmonary disease, unspecified: Secondary | ICD-10-CM | POA: Diagnosis not present

## 2017-07-03 DIAGNOSIS — M47816 Spondylosis without myelopathy or radiculopathy, lumbar region: Secondary | ICD-10-CM | POA: Diagnosis not present

## 2017-07-03 DIAGNOSIS — M25551 Pain in right hip: Secondary | ICD-10-CM | POA: Diagnosis not present

## 2017-07-03 DIAGNOSIS — R609 Edema, unspecified: Secondary | ICD-10-CM | POA: Diagnosis not present

## 2017-07-03 DIAGNOSIS — E7800 Pure hypercholesterolemia, unspecified: Secondary | ICD-10-CM | POA: Diagnosis not present

## 2017-07-03 DIAGNOSIS — Z299 Encounter for prophylactic measures, unspecified: Secondary | ICD-10-CM | POA: Diagnosis not present

## 2017-07-03 DIAGNOSIS — M25552 Pain in left hip: Secondary | ICD-10-CM | POA: Diagnosis not present

## 2017-07-03 DIAGNOSIS — M5136 Other intervertebral disc degeneration, lumbar region: Secondary | ICD-10-CM | POA: Diagnosis not present

## 2017-07-03 DIAGNOSIS — Z6828 Body mass index (BMI) 28.0-28.9, adult: Secondary | ICD-10-CM | POA: Diagnosis not present

## 2017-07-07 DIAGNOSIS — R609 Edema, unspecified: Secondary | ICD-10-CM | POA: Diagnosis not present

## 2017-07-11 DIAGNOSIS — Z299 Encounter for prophylactic measures, unspecified: Secondary | ICD-10-CM | POA: Diagnosis not present

## 2017-07-11 DIAGNOSIS — R609 Edema, unspecified: Secondary | ICD-10-CM | POA: Diagnosis not present

## 2017-07-11 DIAGNOSIS — J449 Chronic obstructive pulmonary disease, unspecified: Secondary | ICD-10-CM | POA: Diagnosis not present

## 2017-07-11 DIAGNOSIS — Z713 Dietary counseling and surveillance: Secondary | ICD-10-CM | POA: Diagnosis not present

## 2017-07-11 DIAGNOSIS — Z6828 Body mass index (BMI) 28.0-28.9, adult: Secondary | ICD-10-CM | POA: Diagnosis not present

## 2017-08-18 ENCOUNTER — Ambulatory Visit (INDEPENDENT_AMBULATORY_CARE_PROVIDER_SITE_OTHER): Payer: MEDICARE | Admitting: Internal Medicine

## 2017-08-18 ENCOUNTER — Encounter: Payer: Self-pay | Admitting: Internal Medicine

## 2017-08-18 VITALS — BP 132/82 | HR 80 | Ht 66.0 in | Wt 177.0 lb

## 2017-08-18 DIAGNOSIS — I10 Essential (primary) hypertension: Secondary | ICD-10-CM | POA: Diagnosis not present

## 2017-08-18 DIAGNOSIS — I2510 Atherosclerotic heart disease of native coronary artery without angina pectoris: Secondary | ICD-10-CM | POA: Diagnosis not present

## 2017-08-18 DIAGNOSIS — E7800 Pure hypercholesterolemia, unspecified: Secondary | ICD-10-CM

## 2017-08-18 DIAGNOSIS — Z720 Tobacco use: Secondary | ICD-10-CM | POA: Diagnosis not present

## 2017-08-18 MED ORDER — ATORVASTATIN CALCIUM 40 MG PO TABS: 40.0000 mg | ORAL_TABLET | Freq: Every day | ORAL | 3 refills | Status: DC

## 2017-08-18 NOTE — Progress Notes (Signed)
Cardiology Office Note   Date:  08/18/2017   ID:  Lisa [name], DOB Feb 18, 1946, MRN [id number]  PCP:  Kirstie Peri, MD  Cardiologist:   Dietrich Pates, MD   Pt returns for f/u of CAD   History of Present Illness: Lisa [name] is a 72 y.o. female with a history of CAD  Pt also has a history of COPD and tob use Last cardiacl intervention was in July 2018   PCI/Stent to LCx / OM bifurcation   Rx ASA and Plavix (didn't tolerate Brilinta)  Last seen in clinic by Jarold Motto in November    Admitted in Dec 2018 with CP   R/O for MI   CP atypical   Some wheezing   Felt due to COPD   Pt has had some LE swelling   Dr Sherryll Burger prescribed lasix( unknown dose) with K for 4 days   Edema improved    Wt down 8 lbs   She ran out (limted prescription) and is not taking  Will have edema every few days and wt up      The pt deines CP   Breahting is OK    Gardening some    Doesn't exercise     Current Meds  Medication Sig  . acetaminophen (TYLENOL) 500 MG tablet Take 1,000 mg by mouth every 6 (six) hours as needed for moderate pain.  Marland Kitchen aspirin EC 81 MG EC tablet Take 1 tablet (81 mg total) by mouth daily.  Marland Kitchen atorvastatin (LIPITOR) 40 MG tablet Take 1 tablet (40 mg total) by mouth daily at 6 PM. (Patient taking differently: Take 40 mg by mouth daily as needed (takes in the evening). )  . Chlorpheniramine Maleate (ALLERGY PO) Take 1 tablet by mouth daily. Equate brand  . Cholecalciferol (VITAMIN D3) 2000 units TABS Take 2,000 Units by mouth daily.  . Cinnamon 500 MG capsule Take 500 mg by mouth daily.   . clopidogrel (PLAVIX) 75 MG tablet Take 75 mg by mouth daily.  Marland Kitchen esomeprazole (NEXIUM) 40 MG capsule Take 40 mg by mouth daily.  . fluticasone-salmeterol (ADVAIR HFA) 115-21 MCG/ACT inhaler Inhale 2 puffs into the lungs daily as needed (shortness of breath).  . nitroGLYCERIN (NITROSTAT) 0.4 MG SL tablet Place 1 tablet (0.4 mg total) under the tongue every 5 (five) minutes as needed for chest pain.  .  Omega-3 Fatty Acids (FISH OIL OMEGA-3 PO) Take 1 capsule by mouth daily.  . Probiotic Product (PROBIOTIC PO) Take 1 capsule by mouth daily.  . traMADol (ULTRAM) 50 MG tablet Take 1 tablet (50 mg total) by mouth every 6 (six) hours as needed.  . VENTOLIN HFA 108 (90 Base) MCG/ACT inhaler Inhale 2 puffs into the lungs every 6 (six) hours as needed for wheezing or shortness of breath.   . vitamin B-12 (CYANOCOBALAMIN) 500 MCG tablet Take 500 mcg by mouth daily.  . vitamin C (ASCORBIC ACID) 500 MG tablet Take 500 mg by mouth daily.     Allergies:   Brilinta [ticagrelor]; Penicillins; Ranexa [ranolazine]; and Sulfa antibiotics   Past Medical History:  Diagnosis Date  . COPD (chronic obstructive pulmonary disease) (HCC)   . Coronary artery disease   . Cough    " ALL MY LIFE "  . Dyspnea   . GERD (gastroesophageal reflux disease)   . NSTEMI (non-ST elevated myocardial infarction) (HCC) 09/01/2016   DES to Cx/OM bifurcation  . Unstable angina (HCC) 08/2016    Past Surgical History:  Procedure Laterality Date  .  ABDOMINAL HYSTERECTOMY    . CORONARY STENT INTERVENTION  09/05/2016   Successful complex PCI of the circumflex/OM bifurcation using a Synergy DES  . CORONARY STENT INTERVENTION N/A 09/05/2016   Procedure: Coronary Stent Intervention;  Surgeon: Tonny Bollmanooper, Michael, MD;  [facility];  Service: Cardiovascular;  Laterality: N/A;  . LEFT HEART CATH AND CORONARY ANGIOGRAPHY N/A 09/02/2016   Procedure: Left Heart Cath and Coronary Angiography;  Surgeon: Corky CraftsVaranasi, Jayadeep S, MD;  [facility];  Service: Cardiovascular;  Laterality: N/A;     Social History:  The patient  reports that she has been smoking cigarettes.  She has a 25.00 pack-year smoking history. She has never used smokeless tobacco. She reports that she drinks alcohol. She reports that she does not use drugs.   Family History:  The patient's family history includes Asthma in her mother; Heart attack  (age of onset: 1945) in her father; Heart attack (age of onset: 5148) in her son; Prostate cancer in her brother.    ROS:  Please see the history of present illness. All other systems are reviewed and  Negative to the above problem except as noted.    PHYSICAL EXAM: VS:  BP 132/82   Pulse 80   Ht 5\' 6"  (1.676 m)   Wt 80.3 kg (177 lb)   LMP 09/01/2016   SpO2 95%   BMI 28.57 kg/m   GEN: Well nourished, well developed, in no acute distress  HEENT: normal  Neck: no JVD, carotid bruits, or masses Cardiac: RRR; no murmurs, rubs, or gallops,Tr edema  Respiratory:  clear to auscultation bilaterally, normal work of breathing GI: soft, nontender, nondistended, + BS  No hepatomegaly  MS: no deformity Moving all extremities   Skin: warm and dry, no rash Neuro:  Strength and sensation are intact Psych: euthymic mood, full affect   EKG:  EKG is not ordered today.   Lipid Panel    Component Value Date/Time   CHOL 76 10/25/2016 0818   TRIG 81 10/25/2016 0818   HDL 30 (L) 10/25/2016 0818   CHOLHDL 2.5 10/25/2016 0818   VLDL 16 10/25/2016 0818   LDLCALC 30 10/25/2016 0818      Wt Readings from Last 3 Encounters:  08/18/17 80.3 kg (177 lb)  04/18/17 79.4 kg (175 lb)  04/01/17 79.4 kg (175 lb)      ASSESSMENT AND PLAN:  1  CAD   No symptoms to suggest angina   Keep on same meds including plavix given location of stent  2  HTN  Adequate control  3  HL   Needs to be back on statin   Will resume 40   F/U lipids in 12 wks  4   Edema   Triv   Will review records from Dr Margaretmary EddyShah's office re labs and swellling   Prob due to excess sodium  Echo last July LVEf and RVEF normal  5   Tob  Counselled on cessation   Smoking about 8 per day  6   Exercise   Encouraged her to start walking      Current medicines are reviewed at length with the patient today.  The patient does not have concerns regarding medicines.  Signed, Dietrich PatesPaula Agueda Houpt, MD  08/18/2017 9:46 AM    [facility] [address], [place], [place]  [address] Phone: [phone]; Fax: [phone]

## 2017-08-18 NOTE — Patient Instructions (Addendum)
Your physician wants you to follow-up ZO:XWRUEAVWin:December with Dr.Ross   Take Lipitor 40 mg at dinner   Get Fasting Lipids in 12 weeks  All other medications stay the same   Thank you for choosing [facility] !

## 2017-08-21 ENCOUNTER — Telehealth: Payer: Self-pay | Admitting: Internal Medicine

## 2017-08-21 NOTE — Telephone Encounter (Signed)
Agree.  Stop lpiitor I would be important to get LDL very low. Crestor is metaboloized very differently from lipitor  Should not cross react Would she be williing  To try a very low dose 2.5 mg in a few wks?

## 2017-08-21 NOTE — Telephone Encounter (Signed)
Patient calling w/ c/o side effects to Lipitor prescribed on Friday. / tg

## 2017-08-21 NOTE — Telephone Encounter (Signed)
Patient states that since she has taken lipitor for the last 3 days, she has severe hip and leg pain.She is not interested in continuing drug

## 2017-08-22 MED ORDER — ROSUVASTATIN CALCIUM 5 MG PO TABS: 2.5000 mg | ORAL_TABLET | Freq: Every day | ORAL | 3 refills | Status: DC

## 2017-08-22 NOTE — Telephone Encounter (Signed)
Pt will stop lipitor, will try low dose crestor 2.5 mg, e-scribed to pharmacy

## 2017-10-17 ENCOUNTER — Other Ambulatory Visit: Payer: Self-pay | Admitting: Cardiology

## 2017-10-19 ENCOUNTER — Other Ambulatory Visit: Payer: Self-pay | Admitting: Cardiology

## 2017-11-13 DIAGNOSIS — Z23 Encounter for immunization: Secondary | ICD-10-CM | POA: Diagnosis not present

## 2018-01-04 DIAGNOSIS — Z0000 Encounter for general adult medical examination without abnormal findings: Secondary | ICD-10-CM | POA: Diagnosis not present

## 2018-01-04 DIAGNOSIS — Z1339 Encounter for screening examination for other mental health and behavioral disorders: Secondary | ICD-10-CM | POA: Diagnosis not present

## 2018-01-04 DIAGNOSIS — Z7189 Other specified counseling: Secondary | ICD-10-CM | POA: Diagnosis not present

## 2018-01-04 DIAGNOSIS — Z1211 Encounter for screening for malignant neoplasm of colon: Secondary | ICD-10-CM | POA: Diagnosis not present

## 2018-01-04 DIAGNOSIS — R5383 Other fatigue: Secondary | ICD-10-CM | POA: Diagnosis not present

## 2018-01-04 DIAGNOSIS — E559 Vitamin D deficiency, unspecified: Secondary | ICD-10-CM | POA: Diagnosis not present

## 2018-01-04 DIAGNOSIS — Z299 Encounter for prophylactic measures, unspecified: Secondary | ICD-10-CM | POA: Diagnosis not present

## 2018-01-04 DIAGNOSIS — E7800 Pure hypercholesterolemia, unspecified: Secondary | ICD-10-CM | POA: Diagnosis not present

## 2018-01-04 DIAGNOSIS — Z1331 Encounter for screening for depression: Secondary | ICD-10-CM | POA: Diagnosis not present

## 2018-01-04 DIAGNOSIS — Z6829 Body mass index (BMI) 29.0-29.9, adult: Secondary | ICD-10-CM | POA: Diagnosis not present

## 2018-01-08 DIAGNOSIS — Z79899 Other long term (current) drug therapy: Secondary | ICD-10-CM | POA: Diagnosis not present

## 2018-01-08 DIAGNOSIS — E7800 Pure hypercholesterolemia, unspecified: Secondary | ICD-10-CM | POA: Diagnosis not present

## 2018-01-08 DIAGNOSIS — E559 Vitamin D deficiency, unspecified: Secondary | ICD-10-CM | POA: Diagnosis not present

## 2018-01-08 DIAGNOSIS — R5383 Other fatigue: Secondary | ICD-10-CM | POA: Diagnosis not present

## 2018-01-14 DIAGNOSIS — Z1212 Encounter for screening for malignant neoplasm of rectum: Secondary | ICD-10-CM | POA: Diagnosis not present

## 2018-01-14 DIAGNOSIS — Z1211 Encounter for screening for malignant neoplasm of colon: Secondary | ICD-10-CM | POA: Diagnosis not present

## 2018-02-24 ENCOUNTER — Encounter (HOSPITAL_COMMUNITY): Payer: Self-pay | Admitting: Emergency Medicine

## 2018-02-24 ENCOUNTER — Other Ambulatory Visit: Payer: Self-pay

## 2018-02-24 ENCOUNTER — Emergency Department (HOSPITAL_COMMUNITY)
Admission: EM | Admit: 2018-02-24 | Discharge: 2018-02-24 | Disposition: A | Discharge status: Home / Self Care | Payer: MEDICARE | Attending: Emergency Medicine | Admitting: Emergency Medicine

## 2018-02-24 ENCOUNTER — Emergency Department (HOSPITAL_COMMUNITY): Payer: MEDICARE

## 2018-02-24 DIAGNOSIS — I25110 Atherosclerotic heart disease of native coronary artery with unstable angina pectoris: Secondary | ICD-10-CM | POA: Diagnosis not present

## 2018-02-24 DIAGNOSIS — Z7982 Long term (current) use of aspirin: Secondary | ICD-10-CM | POA: Diagnosis not present

## 2018-02-24 DIAGNOSIS — R079 Chest pain, unspecified: Secondary | ICD-10-CM | POA: Diagnosis not present

## 2018-02-24 DIAGNOSIS — R0789 Other chest pain: Secondary | ICD-10-CM | POA: Diagnosis not present

## 2018-02-24 DIAGNOSIS — F17210 Nicotine dependence, cigarettes, uncomplicated: Secondary | ICD-10-CM | POA: Insufficient documentation

## 2018-02-24 DIAGNOSIS — Z79899 Other long term (current) drug therapy: Secondary | ICD-10-CM | POA: Diagnosis not present

## 2018-02-24 DIAGNOSIS — Z7902 Long term (current) use of antithrombotics/antiplatelets: Secondary | ICD-10-CM | POA: Diagnosis not present

## 2018-02-24 DIAGNOSIS — J449 Chronic obstructive pulmonary disease, unspecified: Secondary | ICD-10-CM | POA: Diagnosis not present

## 2018-02-24 LAB — CBC
HEMATOCRIT: 46.8 % — AB (ref 36.0–46.0)
Hemoglobin: 14.5 g/dL (ref 12.0–15.0)
MCH: 30.6 pg (ref 26.0–34.0)
MCHC: 31 g/dL (ref 30.0–36.0)
MCV: 98.7 fL (ref 80.0–100.0)
NRBC: 0 % (ref 0.0–0.2)
Platelets: 224 10*3/uL (ref 150–400)
RBC: 4.74 MIL/uL (ref 3.87–5.11)
RDW: 13.4 % (ref 11.5–15.5)
WBC: 7.7 10*3/uL (ref 4.0–10.5)

## 2018-02-24 LAB — TROPONIN I
Troponin I: <0.03 ng/mL (ref <0.03)
Troponin I: <0.03 ng/mL (ref <0.03)

## 2018-02-24 LAB — BASIC METABOLIC PANEL
ANION GAP: 6 (ref 5–15)
BUN: 15 mg/dL (ref 8–23)
CHLORIDE: 110 mmol/L (ref 98–111)
CO2: 25 mmol/L (ref 22–32)
Calcium: 8.8 mg/dL — ABNORMAL LOW (ref 8.9–10.3)
Creatinine, Ser: 0.85 mg/dL (ref 0.44–1.00)
GFR calc non Af Amer: >60 mL/min (ref >60)
Glucose, Bld: 127 mg/dL — ABNORMAL HIGH (ref 70–99)
Potassium: 3.5 mmol/L (ref 3.5–5.1)
Sodium: 141 mmol/L (ref 135–145)

## 2018-02-24 LAB — LIPASE, BLOOD: Lipase: 35 U/L (ref 11–51)

## 2018-02-24 NOTE — Discharge Instructions (Signed)
As discussed, your evaluation today has been largely reassuring.  But, it is important that you monitor your condition carefully, and do not hesitate to return to the ED if you develop new, or concerning changes in your condition. ? ?Otherwise, please follow-up with your physician for appropriate ongoing care. ? ?

## 2018-02-24 NOTE — ED Provider Notes (Signed)
[facility] EMERGENCY DEPARTMENT Provider Note   CSN: [id number] Arrival date & time: 02/24/18  4098     History   Chief Complaint Chief Complaint  Patient presents with  . Chest Pain    HPI Lisa [name] is a 72 y.o. female.  HPI  Patient with multiple medical issues including COPD, CAD, prior stent, ongoing smoking addiction presents after an episode of chest pain. Patient was at rest when pain began earlier tonight, about 2 hours prior to ED arrival. Pain improved substantially with nitroglycerin, and is currently minimally present. Pain midsternal, tight, with mild associated dyspnea, no syncope. No fever, no chills, no nausea, no vomiting No recent medication change, diet change, activity change.  Past Medical History:  Diagnosis Date  . COPD (chronic obstructive pulmonary disease) (HCC)   . Coronary artery disease   . Cough    " ALL MY LIFE "  . Dyspnea   . GERD (gastroesophageal reflux disease)   . NSTEMI (non-ST elevated myocardial infarction) (HCC) 09/01/2016   DES to Cx/OM bifurcation  . Unstable angina (HCC) 08/2016    Patient Active Problem List   Diagnosis Date Noted  . Chest pain due to CAD (HCC) 02/09/2017  . Glaucoma 11/24/2016  . Diverticulitis 10/11/2016  . Coronary artery disease involving native coronary artery of native heart without angina pectoris   . Chest pain 09/30/2016  . Non-ST elevation (NSTEMI) myocardial infarction (HCC) 09/06/2016  . Coronary artery disease involving native coronary artery of native heart with unstable angina pectoris (HCC)   . Hyperlipidemia LDL goal <70   . Hypokalemia   . Unstable angina (HCC) 08/31/2016  . GERD (gastroesophageal reflux disease) 08/31/2016  . COPD (chronic obstructive pulmonary disease) (HCC) 08/31/2016  . Tobacco abuse 08/31/2016  . Chronic cough 08/31/2016    Past Surgical History:  Procedure Laterality Date  . ABDOMINAL HYSTERECTOMY    . CORONARY STENT INTERVENTION  09/05/2016   Successful complex PCI of the circumflex/OM bifurcation using a Synergy DES  . CORONARY STENT INTERVENTION N/A 09/05/2016   Procedure: Coronary Stent Intervention;  Surgeon: Tonny Bollman, MD;  Location: [facility] INVASIVE CV LAB;  Service: Cardiovascular;  Laterality: N/A;  . LEFT HEART CATH AND CORONARY ANGIOGRAPHY N/A 09/02/2016   Procedure: Left Heart Cath and Coronary Angiography;  Surgeon: Corky Crafts, MD;  [facility] LAB;  Service: Cardiovascular;  Laterality: N/A;     OB History    Gravida  7   Para  6   Term  6   Preterm      AB  1   Living  4     SAB  1   TAB      Ectopic      Multiple      Live Births               Home Medications    Prior to Admission medications   Medication Sig Start Date End Date Taking? Authorizing Provider  acetaminophen (TYLENOL) 500 MG tablet Take 1,000 mg by mouth every 6 (six) hours as needed for moderate pain.    [provider]  aspirin EC 81 MG EC tablet Take 1 tablet (81 mg total) by mouth daily. 09/07/16   [facility], Roe Rutherford, PA  Chlorpheniramine Maleate (ALLERGY PO) Take 1 tablet by mouth daily. Equate brand    [provider]  Cholecalciferol (VITAMIN D3) 2000 units TABS Take 2,000 Units by mouth daily.    [provider]  Cinnamon 500  MG capsule Take 500 mg by mouth daily.     [provider]  clopidogrel (PLAVIX) 75 MG tablet TAKE 4 TABLETS (300 MG TOTAL) BY MOUTH ONCE. THEN TAKE 1 TABLET (75 MG TOTAL) DAILY AFTERWARDS. 10/19/17   Pricilla Riffleoss, Paula V, MD  esomeprazole (NEXIUM) 40 MG capsule Take 40 mg by mouth daily. 08/17/16   [provider]  fluticasone-salmeterol (ADVAIR HFA) 115-21 MCG/ACT inhaler Inhale 2 puffs into the lungs daily as needed (shortness of breath).    [provider]  nitroGLYCERIN (NITROSTAT) 0.4 MG SL tablet Place 1 tablet (0.4 mg total) under the tongue every 5 (five) minutes as needed for chest pain. 09/06/16   [facility], Roe RutherfordAngela Nicole, PA    Omega-3 Fatty Acids (FISH OIL OMEGA-3 PO) Take 1 capsule by mouth daily.    [provider]  Probiotic Product (PROBIOTIC PO) Take 1 capsule by mouth daily.    [provider]  rosuvastatin (CRESTOR) 5 MG tablet Take 0.5 tablets (2.5 mg total) by mouth daily. 08/22/17   Pricilla Riffleoss, Paula V, MD  traMADol (ULTRAM) 50 MG tablet Take 1 tablet (50 mg total) by mouth every 6 (six) hours as needed. 04/01/17   Benjiman CorePickering, Nathan, MD  VENTOLIN HFA 108 [phone](90 Base) MCG/ACT inhaler Inhale 2 puffs into the lungs every 6 (six) hours as needed for wheezing or shortness of breath.  08/17/16   [provider]  vitamin B-12 (CYANOCOBALAMIN) 500 MCG tablet Take 500 mcg by mouth daily.    [provider]  vitamin C (ASCORBIC ACID) 500 MG tablet Take 500 mg by mouth daily.    [provider]    Family History Family History  Problem Relation Age of Onset  . Asthma Mother   . Heart attack Father 7645  . Heart attack Son 48  . Prostate cancer Brother     Social History Social History   Tobacco Use  . Smoking status: Current Some Day Smoker    Packs/day: 0.50    Years: 50.00    Pack years: 25.00    Types: Cigarettes  . Smokeless tobacco: Never Used  Substance Use Topics  . Alcohol use: Yes    Comment: occasional  . Drug use: No     Allergies   Brilinta [ticagrelor]; Penicillins; Ranexa [ranolazine]; and Sulfa antibiotics   Review of Systems Review of Systems  Constitutional:       Per HPI, otherwise negative  HENT:       Per HPI, otherwise negative  Respiratory:       Per HPI, otherwise negative  Cardiovascular:       Per HPI, otherwise negative  Gastrointestinal: Negative for vomiting.  Endocrine:       Negative aside from HPI  Genitourinary:       Neg aside from HPI   Musculoskeletal:       Per HPI, otherwise negative  Skin: Negative.   Neurological: Negative for syncope.     Physical Exam Updated Vital Signs BP 121/67 (BP Location: Right Arm)    Pulse 75   Temp 97.8 F (36.6 C) (Oral)   Resp 20   Ht 5\' 7"  (1.702 m)   Wt 79.8 kg   LMP 09/01/2016   SpO2 92%   BMI 27.57 kg/m   Physical Exam Vitals signs and nursing note reviewed.  Constitutional:      General: She is not in acute distress.    Appearance: She is well-developed.  HENT:     Head: Normocephalic and  atraumatic.  Eyes:     Conjunctiva/sclera: Conjunctivae normal.  Cardiovascular:     Rate and Rhythm: Normal rate and regular rhythm.  Pulmonary:     Effort: Pulmonary effort is normal. No respiratory distress.     Breath sounds: Normal breath sounds. No stridor.  Abdominal:     General: There is no distension.  Skin:    General: Skin is warm and dry.  Neurological:     Mental Status: She is alert and oriented to person, place, and time.     Cranial Nerves: No cranial nerve deficit.      ED Treatments / Results  Labs (all labs ordered are listed, but only abnormal results are displayed) Labs Reviewed  BASIC METABOLIC PANEL - Abnormal; Notable for the following components:      Result Value   Glucose, Bld 127 (*)    Calcium 8.8 (*)    All other components within normal limits  CBC - Abnormal; Notable for the following components:   HCT 46.8 (*)    All other components within normal limits  LIPASE, BLOOD  TROPONIN I  TROPONIN I    EKG Sinus rhythm, rate 74, low voltage, otherwise unremarkable EKG Radiology Dg Chest 2 View  Result Date: 02/24/2018 CLINICAL DATA:  Chest pain. EXAM: CHEST - 2 VIEW COMPARISON:  Radiographs of April 18, 2017. FINDINGS: The heart size and mediastinal contours are within normal limits. Both lungs are clear. No pneumothorax or pleural effusion is noted. Atherosclerosis of thoracic aorta is noted. The visualized skeletal structures are unremarkable. IMPRESSION: No active cardiopulmonary disease. Aortic Atherosclerosis (ICD10-I70.0). Electronically Signed   By: Lupita RaiderJames  Green Jr, M.D.   On: 02/24/2018 21:05     Procedures Procedures (including critical care time)  Medications Ordered in ED Medications - No data to display   Initial Impression / Assessment and Plan / ED Course  I have reviewed the triage vital signs and the nursing notes.  Pertinent labs & imaging results that were available during my care of the patient were reviewed by me and considered in my medical decision making (see chart for details).     11:21 PM After second troponin was normal, I discussed all findings with patient and her husband. She continues to have no ongoing pain, no distress, and with reassuring evaluation here, including troponin, labs, x-ray, low suspicion for atypical ACS. No evidence for other acute new pathology. After conversation about all findings, return precautions, follow-up instructions, the patient was discharged in stable condition.  Final Clinical Impressions(s) / ED Diagnoses   Final diagnoses:  Atypical chest pain     Gerhard MunchLockwood, Shahram Alexopoulos, MD 02/24/18 2321

## 2018-02-24 NOTE — ED Triage Notes (Signed)
Patient c/o central chest pain that radiates to the back. Per patient started today at 5 pm in which she took 3 SL nitro with some relief. Per patient pain decreased from a 10/10 to a 3/10. Patient denies any shortness of breath, nausea, or vomiting. Per patient some light headedness that started after taking the nitor. Patient has cardiac hx of MI with stent placement.

## 2018-03-02 ENCOUNTER — Encounter: Payer: Self-pay | Admitting: Internal Medicine

## 2018-03-02 ENCOUNTER — Ambulatory Visit (INDEPENDENT_AMBULATORY_CARE_PROVIDER_SITE_OTHER): Payer: MEDICARE | Admitting: Internal Medicine

## 2018-03-02 ENCOUNTER — Other Ambulatory Visit (HOSPITAL_COMMUNITY)
Admission: RE | Admit: 2018-03-02 | Discharge: 2018-03-02 | Disposition: A | Discharge status: Home / Self Care | Payer: MEDICARE | Source: Ambulatory Visit | Attending: Internal Medicine | Admitting: Internal Medicine

## 2018-03-02 VITALS — BP 142/86 | HR 73 | Ht 67.0 in | Wt 179.0 lb

## 2018-03-02 DIAGNOSIS — E785 Hyperlipidemia, unspecified: Secondary | ICD-10-CM | POA: Diagnosis not present

## 2018-03-02 LAB — LIPID PANEL
Cholesterol: 144 mg/dL (ref 0–200)
HDL: 41 mg/dL (ref >40)
LDL Cholesterol: 85 mg/dL (ref 0–99)
Total CHOL/HDL Ratio: 3.5 RATIO
Triglycerides: 90 mg/dL (ref <150)
VLDL: 18 mg/dL (ref 0–40)

## 2018-03-02 NOTE — Patient Instructions (Signed)
Medication Instructions:  Your physician recommends that you continue on your current medications as directed. Please refer to the Current Medication list given to you today.  If you need a refill on your cardiac medications before your next appointment, please call your pharmacy.   Lab work: Your physician recommends that you return for lab work in: Today   If you have labs (blood work) drawn today and your tests are completely normal, you will receive your results only by: . MyChart Message (if you have MyChart) OR . A paper copy in the mail If you have any lab test that is abnormal or we need to change your treatment, we will call you to review the results.  Testing/Procedures: NONE   Follow-Up: At [facility], you and your health needs are our priority.  As part of our continuing mission to provide you with exceptional heart care, we have created designated Provider Care Teams.  These Care Teams include your primary Cardiologist (physician) and Advanced Practice Providers (APPs -  Physician Assistants and Nurse Practitioners) who all work together to provide you with the care you need, when you need it. You will need a follow up appointment in 8 months.  Please call our office 2 months in advance to schedule this appointment.  You may see Paula Ross, MD or one of the following Advanced Practice Providers on your designated Care Team:   Brittany Strader, PA-C ([place] Office) . Michele Lenze, PA-C ([place] Office)  Any Other Special Instructions Will Be Listed Below (If Applicable). Thank you for choosing [facility]!     

## 2018-03-02 NOTE — Progress Notes (Signed)
Cardiology Office Note   Date:  03/02/2018   ID:  Lisa [name], DOB 10-21-1945, MRN [id number]  PCP:  Kirstie Peri, MD  Cardiologist:   Dietrich Pates, MD   Pt returns for f/u of CAD   History of Present Illness: Lisa [name] is a 73 y.o. female with a history of CAD  Pt also has a history of COPD and tob use Last cardiacl intervention was in July 2018:  RCA 25%   OM2 90%   Dis LC 90% (bifurcatoin lesion)  PCI/Stent to LCx / OM bifurcation   Rx ASA and Plavix (didn't tolerate Brilinta)   Admitted in Dec 2018 with CP   R/O for MI   CP atypical   Some wheezing   Felt due to COPD   I  Saw the pt in July 2019   On 12/28 pt at home  On computer   Developed tightness   Bad   Took 3 NTG   Improved on last one   BP was up   Then went down    She was seen at [facility] ED on 12/28 No pain when got there   Blood work negative   Sent home  Since then she has felt fine      She did have barbeque the night she went to ED  No problems wieht activity   No CP   Breathing is OK       Current Meds  Medication Sig  . acetaminophen (TYLENOL) 500 MG tablet Take 1,000 mg by mouth every 6 (six) hours as needed for moderate pain.  Marland Kitchen aspirin EC 81 MG EC tablet Take 1 tablet (81 mg total) by mouth daily.  . Chlorpheniramine Maleate (ALLERGY PO) Take 1 tablet by mouth daily. Equate brand  . Cholecalciferol (VITAMIN D3) 2000 units TABS Take 2,000 Units by mouth daily.  . Cinnamon 500 MG capsule Take 500 mg by mouth daily.   . clopidogrel (PLAVIX) 75 MG tablet TAKE 4 TABLETS (300 MG TOTAL) BY MOUTH ONCE. THEN TAKE 1 TABLET (75 MG TOTAL) DAILY AFTERWARDS.  Marland Kitchen esomeprazole (NEXIUM) 40 MG capsule Take 40 mg by mouth daily.  . fluticasone-salmeterol (ADVAIR HFA) 115-21 MCG/ACT inhaler Inhale 2 puffs into the lungs daily as needed (shortness of breath).  . nitroGLYCERIN (NITROSTAT) 0.4 MG SL tablet Place 1 tablet (0.4 mg total) under the tongue every 5 (five) minutes as needed for chest pain.  . Omega-3 Fatty Acids  (FISH OIL OMEGA-3 PO) Take 1 capsule by mouth daily.  . Probiotic Product (PROBIOTIC PO) Take 1 capsule by mouth daily.  . rosuvastatin (CRESTOR) 5 MG tablet Take 0.5 tablets (2.5 mg total) by mouth daily.  . traMADol (ULTRAM) 50 MG tablet Take 1 tablet (50 mg total) by mouth every 6 (six) hours as needed.  . VENTOLIN HFA 108 (90 Base) MCG/ACT inhaler Inhale 2 puffs into the lungs every 6 (six) hours as needed for wheezing or shortness of breath.   . vitamin B-12 (CYANOCOBALAMIN) 500 MCG tablet Take 500 mcg by mouth daily.  . vitamin C (ASCORBIC ACID) 500 MG tablet Take 500 mg by mouth daily.     Allergies:   Brilinta [ticagrelor]; Penicillins; Ranexa [ranolazine]; and Sulfa antibiotics   Past Medical History:  Diagnosis Date  . COPD (chronic obstructive pulmonary disease) (HCC)   . Coronary artery disease   . Cough    " ALL MY LIFE "  . Dyspnea   . GERD (gastroesophageal reflux disease)   .  NSTEMI (non-ST elevated myocardial infarction) (HCC) 09/01/2016   DES to Cx/OM bifurcation  . Unstable angina (HCC) 08/2016    Past Surgical History:  Procedure Laterality Date  . ABDOMINAL HYSTERECTOMY    . CORONARY STENT INTERVENTION  09/05/2016   Successful complex PCI of the circumflex/OM bifurcation using a Synergy DES  . CORONARY STENT INTERVENTION N/A 09/05/2016   Procedure: Coronary Stent Intervention;  Surgeon: Tonny Bollmanooper, Michael, MD;  [facility];  Service: Cardiovascular;  Laterality: N/A;  . LEFT HEART CATH AND CORONARY ANGIOGRAPHY N/A 09/02/2016   Procedure: Left Heart Cath and Coronary Angiography;  Surgeon: Corky CraftsVaranasi, Jayadeep S, MD;  [facility];  Service: Cardiovascular;  Laterality: N/A;     Social History:  The patient  reports that she has been smoking cigarettes. She has a 25.00 pack-year smoking history. She has never used smokeless tobacco. She reports current alcohol use. She reports that she does not use drugs.   Family History:  The  patient's family history includes Asthma in her mother; Heart attack (age of onset: 6745) in her father; Heart attack (age of onset: 2848) in her son; Prostate cancer in her brother.    ROS:  Please see the history of present illness. All other systems are reviewed and  Negative to the above problem except as noted.    PHYSICAL EXAM: VS:  BP (!) 142/86   Pulse 73   Ht 5\' 7"  (1.702 m)   Wt 179 lb (81.2 kg)   LMP 09/01/2016   SpO2 94%   BMI 28.04 kg/m   GEN: Well nourished, well developed, in no acute distress  HEENT: normal  Neck: no JVD, carotid bruits, or masses Cardiac: RRR; no murmurs, rubs, or gallops,Tr edema  Respiratory:  Rhonchi that clar with cough                  [id number] GI: soft, nontender, nondistended, + BS  No hepatomegaly  MS: no deformity Moving all extremities   Skin: warm and dry, no rash Neuro:  Strength and sensation are intact Psych: euthymic mood, full affect   EKG:  EKG is not ordered today.   Lipid Panel    Component Value Date/Time   CHOL 76 10/25/2016 0818   TRIG 81 10/25/2016 0818   HDL 30 (L) 10/25/2016 0818   CHOLHDL 2.5 10/25/2016 0818   VLDL 16 10/25/2016 0818   LDLCALC 30 10/25/2016 0818      Wt Readings from Last 3 Encounters:  03/02/18 179 lb (81.2 kg)  02/24/18 176 lb (79.8 kg)  08/18/17 177 lb (80.3 kg)      ASSESSMENT AND PLAN:  1  CAD   I dont think recent spell of CP was angina   ? GI   None since Keep on same regimen   WIth location of intervention I would keep on ASA and Plavix  2  HTN BP is a little high   She says it is better at home     Stressed today   Follow   3  HL   Will check lipids   4   Edema   Triv   5   Tob  Counselled again  on cessation  Down to 4 per day   6   Exercise   Encouraged her to start walking      Current medicines are reviewed at length with the patient today.  The patient does not have concerns regarding medicines.  Signed, Gunnar FusiPaula  Tenny Crawoss, MD  03/02/2018 1:03 PM    [facility] [address], [place], [place]  [address] Phone: [phone]; Fax: [phone]

## 2018-03-06 ENCOUNTER — Telehealth: Payer: Self-pay | Admitting: *Deleted

## 2018-03-06 DIAGNOSIS — Z299 Encounter for prophylactic measures, unspecified: Secondary | ICD-10-CM | POA: Diagnosis not present

## 2018-03-06 DIAGNOSIS — R195 Other fecal abnormalities: Secondary | ICD-10-CM | POA: Diagnosis not present

## 2018-03-06 DIAGNOSIS — J449 Chronic obstructive pulmonary disease, unspecified: Secondary | ICD-10-CM | POA: Diagnosis not present

## 2018-03-06 DIAGNOSIS — I2510 Atherosclerotic heart disease of native coronary artery without angina pectoris: Secondary | ICD-10-CM | POA: Diagnosis not present

## 2018-03-06 DIAGNOSIS — Z6828 Body mass index (BMI) 28.0-28.9, adult: Secondary | ICD-10-CM | POA: Diagnosis not present

## 2018-03-06 DIAGNOSIS — E785 Hyperlipidemia, unspecified: Secondary | ICD-10-CM

## 2018-03-06 DIAGNOSIS — F17210 Nicotine dependence, cigarettes, uncomplicated: Secondary | ICD-10-CM | POA: Diagnosis not present

## 2018-03-06 NOTE — Telephone Encounter (Signed)
Called patient with test results. No answer. Left message to call back.  

## 2018-03-06 NOTE — Telephone Encounter (Signed)
-----   Message from Nori Riis, RN sent at 03/05/2018  7:40 AM EST -----  ----- Message ----- From: Pricilla Riffle, MD Sent: 03/02/2018   9:57 PM EST To: Nori Riis, RN  LDL is 85   With CAD should be lower    I would recomm increasing Crestor to 5 mg daily F/U lipds in 8 wks

## 2018-03-06 NOTE — Telephone Encounter (Signed)
-----   Message from Catherine A Carlton, RN sent at 03/05/2018  7:40 AM EST -----  ----- Message ----- From: Ross, Paula V, MD Sent: 03/02/2018   9:57 PM EST To: Catherine A Carlton, RN  LDL is 85   With CAD should be lower    I would recomm increasing Crestor to 5 mg daily F/U lipds in 8 wks  

## 2018-03-23 DIAGNOSIS — Z299 Encounter for prophylactic measures, unspecified: Secondary | ICD-10-CM | POA: Diagnosis not present

## 2018-03-23 DIAGNOSIS — Z6829 Body mass index (BMI) 29.0-29.9, adult: Secondary | ICD-10-CM | POA: Diagnosis not present

## 2018-03-23 DIAGNOSIS — K5732 Diverticulitis of large intestine without perforation or abscess without bleeding: Secondary | ICD-10-CM | POA: Diagnosis not present

## 2018-03-23 DIAGNOSIS — J449 Chronic obstructive pulmonary disease, unspecified: Secondary | ICD-10-CM | POA: Diagnosis not present

## 2018-03-23 DIAGNOSIS — E7800 Pure hypercholesterolemia, unspecified: Secondary | ICD-10-CM | POA: Diagnosis not present

## 2018-03-23 DIAGNOSIS — F17210 Nicotine dependence, cigarettes, uncomplicated: Secondary | ICD-10-CM | POA: Diagnosis not present

## 2018-05-07 DIAGNOSIS — Z299 Encounter for prophylactic measures, unspecified: Secondary | ICD-10-CM | POA: Diagnosis not present

## 2018-05-07 DIAGNOSIS — J449 Chronic obstructive pulmonary disease, unspecified: Secondary | ICD-10-CM | POA: Diagnosis not present

## 2018-05-07 DIAGNOSIS — I2510 Atherosclerotic heart disease of native coronary artery without angina pectoris: Secondary | ICD-10-CM | POA: Diagnosis not present

## 2018-05-07 DIAGNOSIS — F17210 Nicotine dependence, cigarettes, uncomplicated: Secondary | ICD-10-CM | POA: Diagnosis not present

## 2018-05-07 DIAGNOSIS — R600 Localized edema: Secondary | ICD-10-CM | POA: Diagnosis not present

## 2018-05-07 DIAGNOSIS — Z6829 Body mass index (BMI) 29.0-29.9, adult: Secondary | ICD-10-CM | POA: Diagnosis not present

## 2018-05-07 DIAGNOSIS — E7800 Pure hypercholesterolemia, unspecified: Secondary | ICD-10-CM | POA: Diagnosis not present

## 2018-10-04 IMAGING — CT CT ABD-PELV W/ CM
2 of 5 series · 16 of 46 positions shown, 18 images · IV contrast (Isovue)
Comparison: 09/26/2009 CT

CLINICAL DATA: Right lower quadrant pain. History of diverticulitis
and hysterectomy.

EXAM:
CT ABDOMEN AND PELVIS WITH CONTRAST
TECHNIQUE: Multidetector CT imaging of the abdomen and pelvis was performed
using the standard protocol following bolus administration of
intravenous contrast.
CONTRAST:  100mL [id number] IOPAMIDOL ([id number]) INJECTION 61%

[Series 2: axial st · axial · 0.70mm/px · z∈[-495,-90]mm · 13 of 91 slices shown, 15 images]
[im 5/91  soft-tissue]
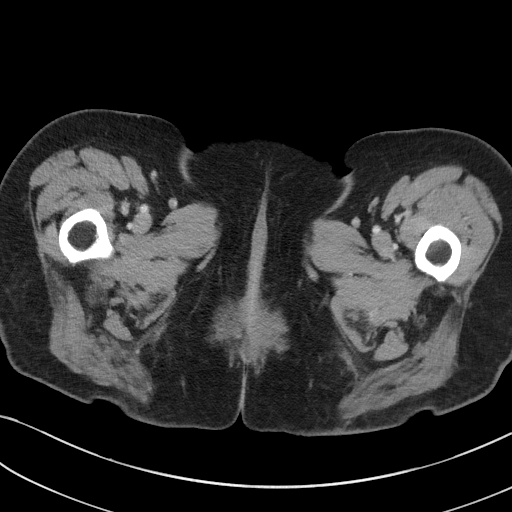
[im 5/91  bone]
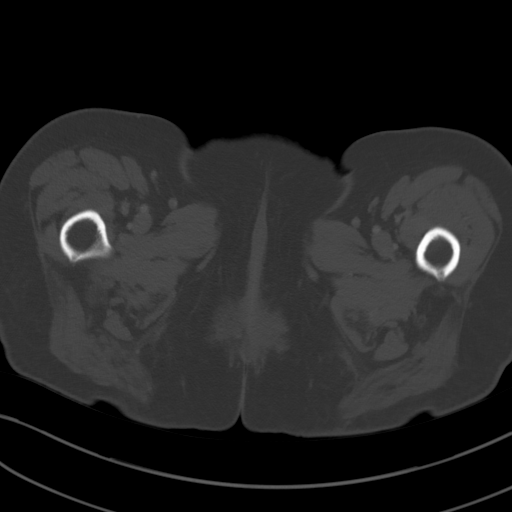
[im 15/91  soft-tissue]
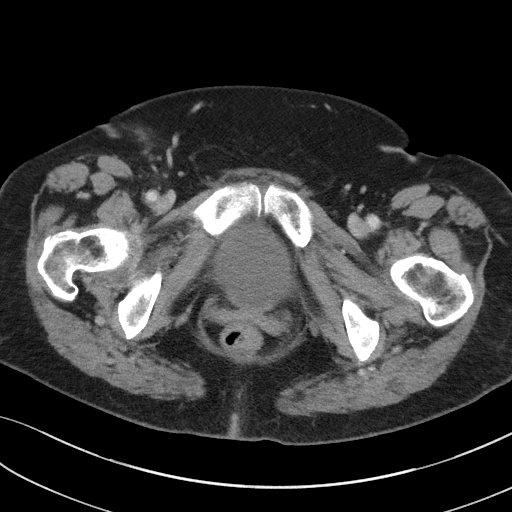
[im 19/91  soft-tissue]
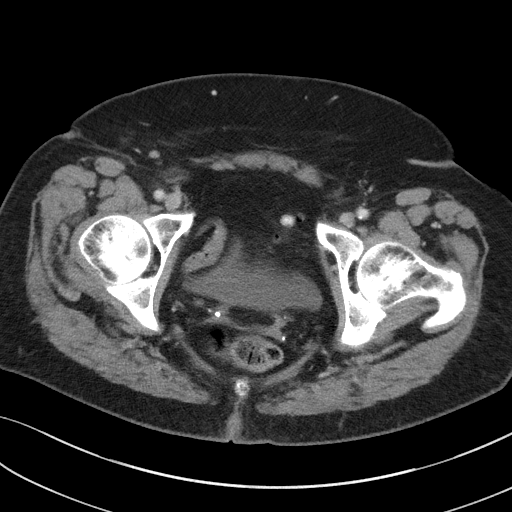
[im 24/91  soft-tissue]
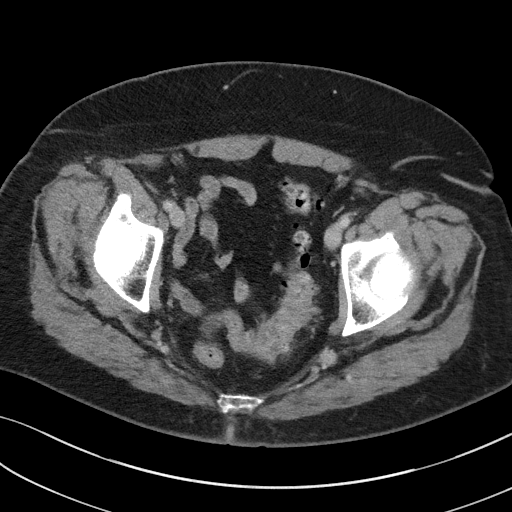
[im 34/91  soft-tissue]
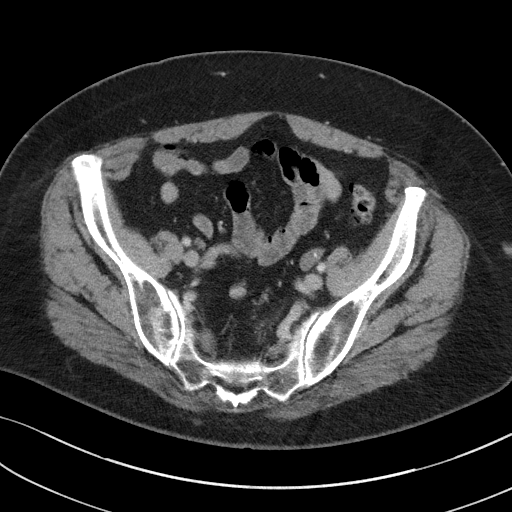
[im 38/91  soft-tissue]
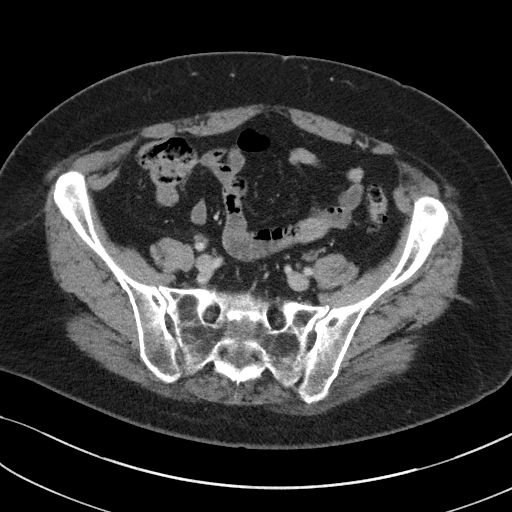
[im 48/91  soft-tissue]
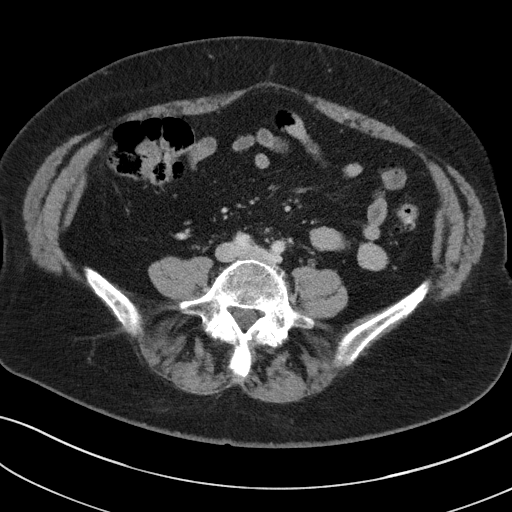
[im 53/91  soft-tissue]
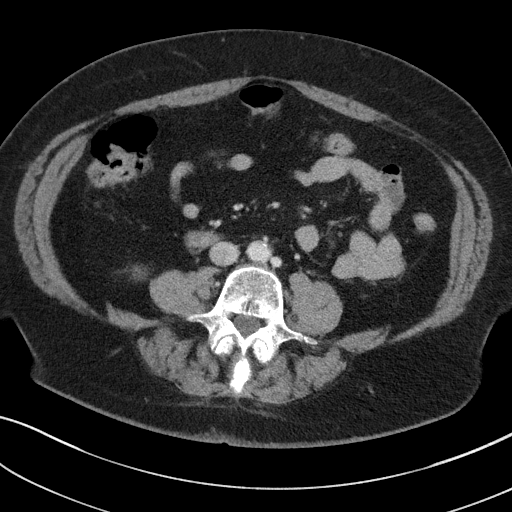
[im 57/91  soft-tissue]
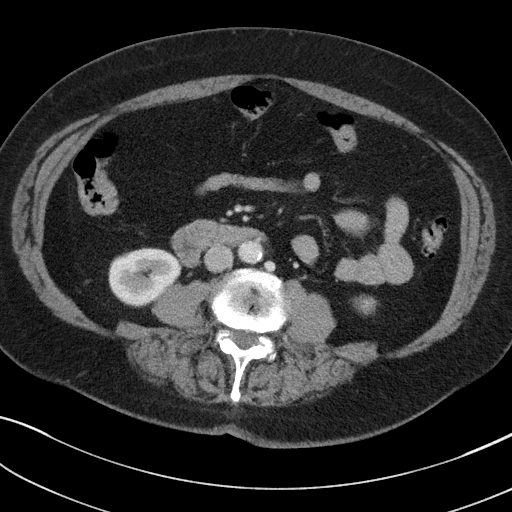
[im 57/91  bone]
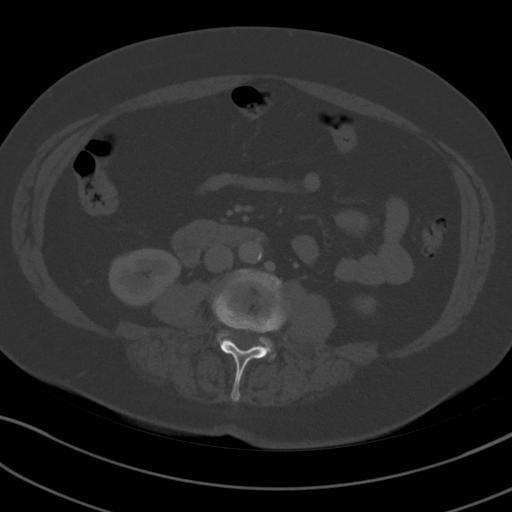
[im 67/91  soft-tissue]
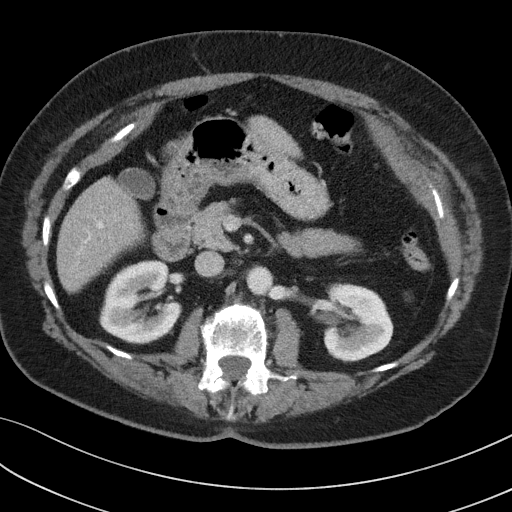
[im 72/91  soft-tissue]
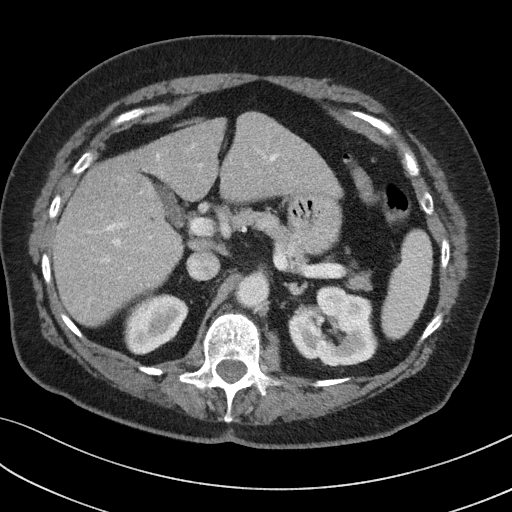
[im 76/91  soft-tissue]
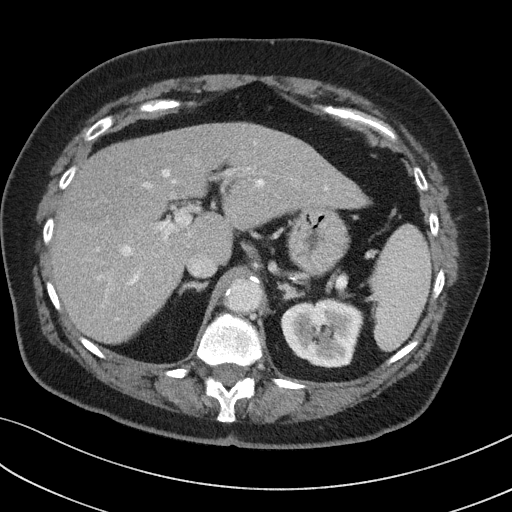
[im 86/91  soft-tissue]
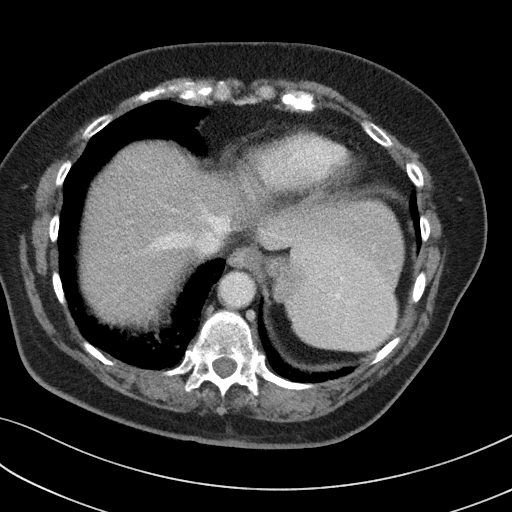

[Series 5: coronal st · coronal · 0.71mm/px · 3 of 96 slices shown]
[im 32/96  soft-tissue]
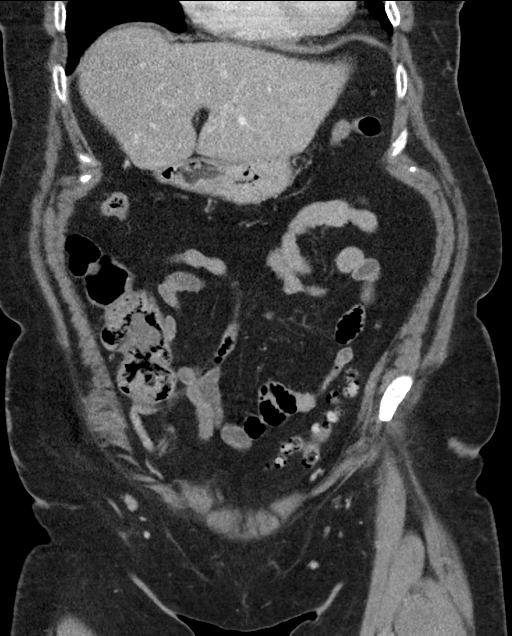
[im 43/96  soft-tissue]
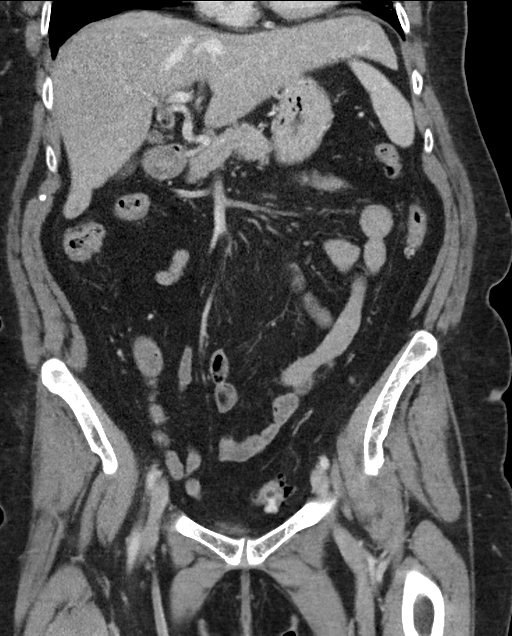
[im 53/96  soft-tissue]
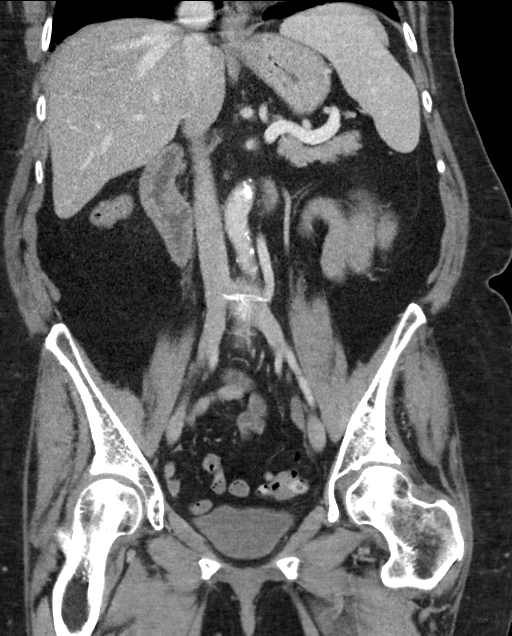

[16 of 46 positions shown; findings below may reference images not displayed]

FINDINGS: Lower chest: The included heart is normal in size without
pericardial effusion. Dependent bibasilar atelectasis. 4 mm stable
right middle lobe nodule.

Hepatobiliary: Mild hepatic steatosis. Normal appearing gallbladder.
No biliary dilatation.

Pancreas: Normal

Spleen: Normal

Adrenals/Urinary Tract: Normal bilateral adrenal glands. 3 mm
interpolar left renal calculus without obstruction. Simple appearing
left interpolar renal cyst measuring 16 x 12 x 10 mm. No
hydroureteronephrosis. Bladder is unremarkable.

Stomach/Bowel: Acute sigmoid diverticulitis without bowel
obstruction, free air or abscess. Pericolonic inflammation is noted
along the mid sigmoid. Circular muscle hypertrophy and
diverticulosis is otherwise noted of the distal descending through
sigmoid colon. Stomach, small intestine and appendix are nonacute.

Vascular/Lymphatic: Aortoiliac atherosclerosis without aneurysm.
Retroaortic left renal vein.

Reproductive: Status post hysterectomy. No adnexal masses.

Other: No abdominal wall hernia or abnormality. No abdominopelvic
ascites.

Musculoskeletal: Degenerative disc disease from L1 through L5
consistent with lumbar spondylosis. No acute nor suspicious osseous
lesions.
IMPRESSION: 1. Acute uncomplicated sigmoid diverticulitis.
2. Nonobstructing 3 mm left interpolar renal calculus adjacent to a
simple 16 x 12 x 10 mm simple renal cyst.
3. Aortoiliac atherosclerosis.
4. Stable 4 mm right middle lobe noncalcified pulmonary nodule
dating back to 3466 consistent with a benign finding.

## 2018-10-29 ENCOUNTER — Other Ambulatory Visit: Payer: Self-pay | Admitting: Internal Medicine

## 2018-11-06 ENCOUNTER — Emergency Department (HOSPITAL_COMMUNITY)
Admission: EM | Admit: 2018-11-06 | Discharge: 2018-11-06 | Disposition: A | Discharge status: Home / Self Care | Payer: Medicare Other

## 2018-11-06 ENCOUNTER — Other Ambulatory Visit: Payer: Self-pay

## 2018-12-13 IMAGING — CR DG CHEST 1V PORT
1 series · 1 of 1 positions shown · non-contrast
Comparison: Chest radiograph 09/30/2016

CLINICAL DATA: Chest pain

EXAM:
PORTABLE CHEST 1 VIEW

[portable]
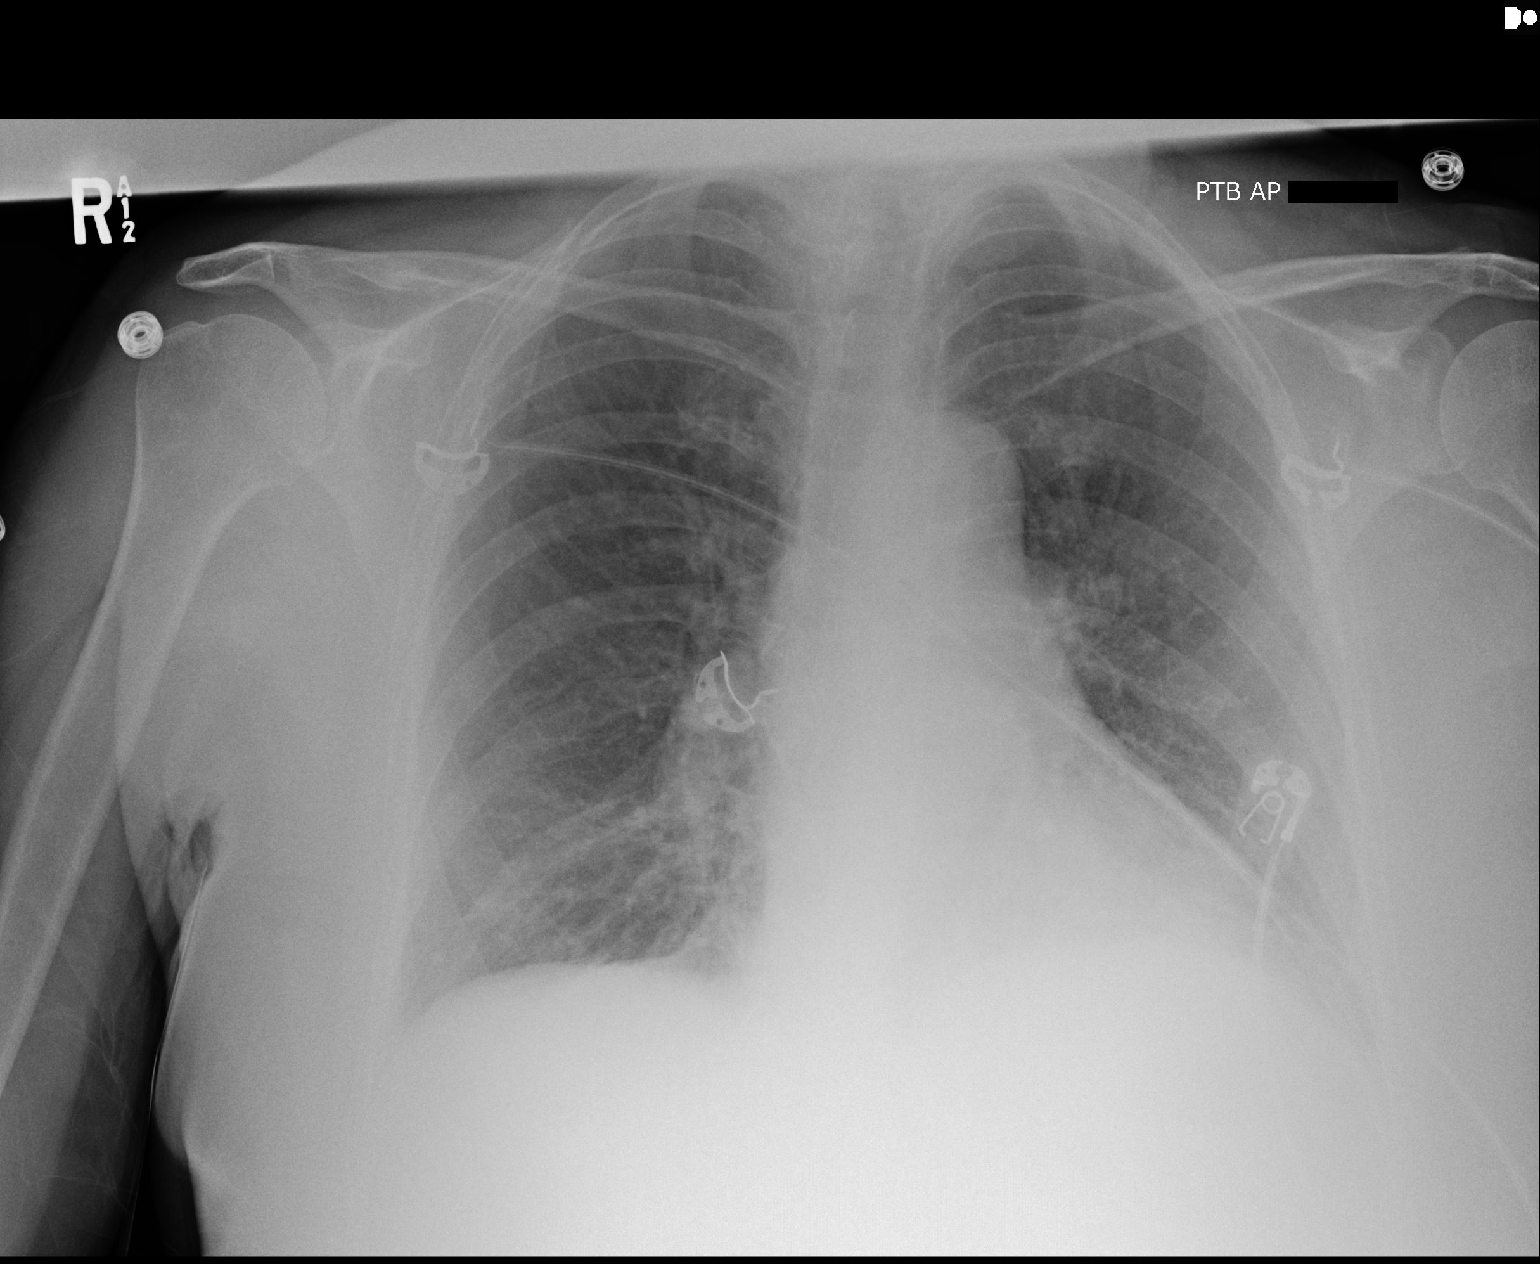

[1 of 1 positions shown; findings below may reference images not displayed]

FINDINGS: Normal cardiomediastinal contours. Hazy interstitial opacities
bilaterally no focal consolidation. No pleural effusion or
pneumothorax. Bibasilar atelectasis.
IMPRESSION: Bibasilar atelectasis and mild pulmonary vascular congestion.

## 2019-03-26 ENCOUNTER — Other Ambulatory Visit: Payer: Self-pay

## 2019-03-26 ENCOUNTER — Encounter (HOSPITAL_COMMUNITY): Payer: Self-pay | Admitting: Emergency Medicine

## 2019-03-26 ENCOUNTER — Emergency Department (HOSPITAL_COMMUNITY)
Admission: EM | Admit: 2019-03-26 | Discharge: 2019-03-26 | Disposition: A | Discharge status: Home / Self Care | Payer: Medicare Other | Attending: Emergency Medicine | Admitting: Emergency Medicine

## 2019-03-26 DIAGNOSIS — Z5329 Procedure and treatment not carried out because of patient's decision for other reasons: Secondary | ICD-10-CM | POA: Diagnosis not present

## 2019-03-26 DIAGNOSIS — I252 Old myocardial infarction: Secondary | ICD-10-CM | POA: Diagnosis not present

## 2019-03-26 DIAGNOSIS — Z743 Need for continuous supervision: Secondary | ICD-10-CM | POA: Diagnosis not present

## 2019-03-26 DIAGNOSIS — R0902 Hypoxemia: Secondary | ICD-10-CM | POA: Diagnosis not present

## 2019-03-26 DIAGNOSIS — Z888 Allergy status to other drugs, medicaments and biological substances status: Secondary | ICD-10-CM | POA: Diagnosis not present

## 2019-03-26 DIAGNOSIS — I25110 Atherosclerotic heart disease of native coronary artery with unstable angina pectoris: Secondary | ICD-10-CM | POA: Insufficient documentation

## 2019-03-26 DIAGNOSIS — F17210 Nicotine dependence, cigarettes, uncomplicated: Secondary | ICD-10-CM | POA: Insufficient documentation

## 2019-03-26 DIAGNOSIS — Z79899 Other long term (current) drug therapy: Secondary | ICD-10-CM | POA: Insufficient documentation

## 2019-03-26 DIAGNOSIS — Z7982 Long term (current) use of aspirin: Secondary | ICD-10-CM | POA: Insufficient documentation

## 2019-03-26 DIAGNOSIS — Z880 Allergy status to penicillin: Secondary | ICD-10-CM | POA: Diagnosis not present

## 2019-03-26 DIAGNOSIS — K219 Gastro-esophageal reflux disease without esophagitis: Secondary | ICD-10-CM | POA: Diagnosis not present

## 2019-03-26 DIAGNOSIS — J449 Chronic obstructive pulmonary disease, unspecified: Secondary | ICD-10-CM | POA: Diagnosis not present

## 2019-03-26 DIAGNOSIS — R040 Epistaxis: Secondary | ICD-10-CM | POA: Diagnosis not present

## 2019-03-26 DIAGNOSIS — Z882 Allergy status to sulfonamides status: Secondary | ICD-10-CM | POA: Diagnosis not present

## 2019-03-26 DIAGNOSIS — Z7902 Long term (current) use of antithrombotics/antiplatelets: Secondary | ICD-10-CM | POA: Insufficient documentation

## 2019-03-26 DIAGNOSIS — I10 Essential (primary) hypertension: Secondary | ICD-10-CM | POA: Diagnosis not present

## 2019-03-26 DIAGNOSIS — R58 Hemorrhage, not elsewhere classified: Secondary | ICD-10-CM | POA: Diagnosis not present

## 2019-03-26 DIAGNOSIS — I519 Heart disease, unspecified: Secondary | ICD-10-CM | POA: Diagnosis not present

## 2019-03-26 DIAGNOSIS — Z7951 Long term (current) use of inhaled steroids: Secondary | ICD-10-CM | POA: Diagnosis not present

## 2019-03-26 HISTORY — DX: Unspecified asthma, uncomplicated: J45.909

## 2019-03-26 LAB — CBC
HCT: 44.9 % (ref 36.0–46.0)
Hemoglobin: 14.4 g/dL (ref 12.0–15.0)
MCH: 30.9 pg (ref 26.0–34.0)
MCHC: 32.1 g/dL (ref 30.0–36.0)
MCV: 96.4 fL (ref 80.0–100.0)
Platelets: 246 10*3/uL (ref 150–400)
RBC: 4.66 MIL/uL (ref 3.87–5.11)
RDW: 12.9 % (ref 11.5–15.5)
WBC: 10 10*3/uL (ref 4.0–10.5)
nRBC: 0 % (ref 0.0–0.2)

## 2019-03-26 MED ORDER — DOXYCYCLINE HYCLATE 100 MG PO CAPS: 100.0000 mg | ORAL_CAPSULE | Freq: Two times a day (BID) | ORAL | 0 refills | Status: AC

## 2019-03-26 MED ORDER — ACETAMINOPHEN 325 MG PO TABS
650.0000 mg | ORAL_TABLET | Freq: Once | ORAL | Status: AC
  Administered 2019-03-26: 650 mg via ORAL
  Filled 2019-03-26: qty 2

## 2019-03-26 MED ORDER — BACITRACIN ZINC 500 UNIT/GM EX OINT
TOPICAL_OINTMENT | Freq: Once | CUTANEOUS | Status: AC
  Administered 2019-03-26: 1 via TOPICAL
  Filled 2019-03-26: qty 0.9

## 2019-03-26 NOTE — ED Notes (Signed)
Ventura Sellers (son) [phone].

## 2019-03-26 NOTE — ED Provider Notes (Addendum)
[facility] EMERGENCY DEPARTMENT Provider Note   CSN: [id number] Arrival date & time: 03/26/19  5188     History No chief complaint on file.   Lisa [name] is a 74 y.o. female history of COPD, CAD, GERD, hyperlipidemia, on aspirin/Plavix.  Patient presents today for epistaxis beginning at 4 AM this morning.  She presented to [facility] and had bilateral nasal packing placed, she reports bleeding continued so she came to this ER for evaluation.  She denies any pain or additional concerns today she denies any nasal trauma reports this was a spontaneous bleed that resolved shortly before 4 AM this morning.  She reports that since packing has been placed the blood has begun to run down the back of her throat.  Denies fever/chills, injury/trauma, pain, vision changes, lightheadedness, chest pain/shortness of breath or any additional concerns.  HPI     Past Medical History:  Diagnosis Date   Asthma    COPD (chronic obstructive pulmonary disease) (HCC)    Coronary artery disease    Cough    " ALL MY LIFE "   Dyspnea    GERD (gastroesophageal reflux disease)    NSTEMI (non-ST elevated myocardial infarction) (HCC) 09/01/2016   DES to Cx/OM bifurcation   Unstable angina (HCC) 08/2016    Patient Active Problem List   Diagnosis Date Noted   Chest pain due to CAD (HCC) 02/09/2017   Glaucoma 11/24/2016   Diverticulitis 10/11/2016   Coronary artery disease involving native coronary artery of native heart without angina pectoris    Chest pain 09/30/2016   Non-ST elevation (NSTEMI) myocardial infarction (HCC) 09/06/2016   Coronary artery disease involving native coronary artery of native heart with unstable angina pectoris (HCC)    Hyperlipidemia LDL goal <70    Hypokalemia    Unstable angina (HCC) 08/31/2016   GERD (gastroesophageal reflux disease) 08/31/2016   COPD (chronic obstructive pulmonary disease) (HCC) 08/31/2016   Tobacco abuse 08/31/2016    Chronic cough 08/31/2016    Past Surgical History:  Procedure Laterality Date   ABDOMINAL HYSTERECTOMY     CORONARY STENT INTERVENTION  09/05/2016   Successful complex PCI of the circumflex/OM bifurcation using a Synergy DES   CORONARY STENT INTERVENTION N/A 09/05/2016   Procedure: Coronary Stent Intervention;  Surgeon: Tonny Bollman, MD;  Location: [facility] INVASIVE CV LAB;  Service: Cardiovascular;  Laterality: N/A;   LEFT HEART CATH AND CORONARY ANGIOGRAPHY N/A 09/02/2016   Procedure: Left Heart Cath and Coronary Angiography;  Surgeon: Corky Crafts, MD;  Location: [facility] INVASIVE CV LAB;  Service: Cardiovascular;  Laterality: N/A;     OB History    Gravida  7   Para  6   Term  6   Preterm      AB  1   Living  4     SAB  1   TAB      Ectopic      Multiple      Live Births              Family History  Problem Relation Age of Onset   Asthma Mother    Heart attack Father 25   Heart attack Son 57   Prostate cancer Brother     Social History   Tobacco Use   Smoking status: Current Some Day Smoker    Packs/day: 0.50    Years: 50.00    Pack years: 25.00    Types: Cigarettes   Smokeless tobacco: Never Used  Substance  Use Topics   Alcohol use: Yes    Comment: occasional   Drug use: No    Home Medications Prior to Admission medications   Medication Sig Start Date End Date Taking? Authorizing Provider  acetaminophen (TYLENOL) 500 MG tablet Take 1,000 mg by mouth every 6 (six) hours as needed for moderate pain.    [provider]  aspirin EC 81 MG EC tablet Take 1 tablet (81 mg total) by mouth daily. 09/07/16   [facility], Roe Rutherford, PA  Chlorpheniramine Maleate (ALLERGY PO) Take 1 tablet by mouth daily. Equate brand    [provider]  Cholecalciferol (VITAMIN D3) 2000 units TABS Take 2,000 Units by mouth daily.    [provider]  Cinnamon 500 MG capsule Take 500 mg by mouth daily.     [provider]    clopidogrel (PLAVIX) 75 MG tablet TAKE 4 TABLETS BY MOUTH AS A ONE TIME DOSE THEN  TAKE  1  TABLET  DAILY  AFTERWARDS 10/29/18   Pricilla Riffle, MD  doxycycline (VIBRAMYCIN) 100 MG capsule Take 1 capsule (100 mg total) by mouth 2 (two) times daily for 7 days. 03/26/19 04/02/19  Harlene Salts A, PA-C  esomeprazole (NEXIUM) 40 MG capsule Take 40 mg by mouth daily. 08/17/16   [provider]  fluticasone-salmeterol (ADVAIR HFA) 115-21 MCG/ACT inhaler Inhale 2 puffs into the lungs daily as needed (shortness of breath).    [provider]  nitroGLYCERIN (NITROSTAT) 0.4 MG SL tablet Place 1 tablet (0.4 mg total) under the tongue every 5 (five) minutes as needed for chest pain. 09/06/16   [facility], Roe Rutherford, PA  Omega-3 Fatty Acids (FISH OIL OMEGA-3 PO) Take 1 capsule by mouth daily.    [provider]  Probiotic Product (PROBIOTIC PO) Take 1 capsule by mouth daily.    [provider]  rosuvastatin (CRESTOR) 5 MG tablet Take 0.5 tablets (2.5 mg total) by mouth daily. 08/22/17   Pricilla Riffle, MD  traMADol (ULTRAM) 50 MG tablet Take 1 tablet (50 mg total) by mouth every 6 (six) hours as needed. 04/01/17   Benjiman Core, MD  VENTOLIN HFA 108 [phone] Base) MCG/ACT inhaler Inhale 2 puffs into the lungs every 6 (six) hours as needed for wheezing or shortness of breath.  08/17/16   [provider]  vitamin B-12 (CYANOCOBALAMIN) 500 MCG tablet Take 500 mcg by mouth daily.    [provider]  vitamin C (ASCORBIC ACID) 500 MG tablet Take 500 mg by mouth daily.    [provider]    Allergies    Brilinta [ticagrelor], Penicillins, Ranexa [ranolazine], and Sulfa antibiotics  Review of Systems   Review of Systems Ten systems are reviewed and are negative for acute change except as noted in the HPI  Physical Exam Updated Vital Signs BP 119/87    Pulse 75    Temp 98.5 F (36.9 C) (Oral)    Resp 18    Ht 5\' 7"  (1.702 m)    Wt 76.2 kg    LMP 09/01/2016 (LMP  Unknown)    SpO2 95%    BMI 26.31 kg/m   Physical Exam Constitutional:      General: She is not in acute distress.    Appearance: Normal appearance. She is well-developed. She is not ill-appearing or diaphoretic.     Comments: Uncomfortable appearing  HENT:     Head: Normocephalic and atraumatic.     Jaw: There is normal jaw occlusion. No trismus.  Right Ear: External ear normal.     Left Ear: External ear normal.     Nose: No signs of injury or nasal tenderness.     Right Nostril: Epistaxis present.     Left Nostril: No epistaxis.     Right Sinus: No maxillary sinus tenderness or frontal sinus tenderness.     Left Sinus: No maxillary sinus tenderness or frontal sinus tenderness.     Mouth/Throat:     Mouth: Mucous membranes are moist.     Pharynx: Oropharynx is clear.  Eyes:     General: Vision grossly intact. Gaze aligned appropriately.     Conjunctiva/sclera: Conjunctivae normal.     Pupils: Pupils are equal, round, and reactive to light.  Neck:     Trachea: Trachea and phonation normal. No tracheal deviation.  Pulmonary:     Effort: Pulmonary effort is normal. No respiratory distress.  Abdominal:     General: There is no distension.     Palpations: Abdomen is soft.     Tenderness: There is no abdominal tenderness. There is no guarding or rebound.  Musculoskeletal:        General: Normal range of motion.     Cervical back: Full passive range of motion without pain, normal range of motion and neck supple.  Skin:    General: Skin is warm and dry.  Neurological:     Mental Status: She is alert.     GCS: GCS eye subscore is 4. GCS verbal subscore is 5. GCS motor subscore is 6.     Comments: Speech is clear and goal oriented, follows commands Major Cranial nerves without deficit, no facial droop Moves extremities without ataxia, coordination intact  Psychiatric:        Behavior: Behavior normal.     ED Results / Procedures / Treatments   Labs (all labs ordered are  listed, but only abnormal results are displayed) Labs Reviewed  CBC    EKG None  Radiology No results found.  Procedures .Epistaxis Management  Date/Time: 03/26/2019 1:53 PM Performed by: Bill Salinas, PA-C Authorized by: Bill Salinas, PA-C   Consent:    Consent obtained:  Verbal   Consent given by:  Patient   Risks discussed:  Bleeding, infection, nasal injury and pain Anesthesia (see MAR for exact dosages):    Anesthesia method:  None Procedure details:    Treatment site:  R posterior   Treatment method:  Nasal balloon Post-procedure details:    Assessment:  Bleeding stopped   Patient tolerance of procedure:  Tolerated well, no immediate complications   (including critical care time)  Medications Ordered in ED Medications  bacitracin ointment (1 application Topical Given 03/26/19 1030)  acetaminophen (TYLENOL) tablet 650 mg (650 mg Oral Given 03/26/19 1155)    ED Course  I have reviewed the triage vital signs and the nursing notes.  Pertinent labs & imaging results that were available during my care of the patient were reviewed by me and considered in my medical decision making (see chart for details).    MDM Rules/Calculators/A&P                     74 year old female presenting today for epistaxis, she had bilateral anterior nasal packing placed at another local ER.  Bleeding continued since that time and she reports blood-tinged on the back of her throat.  The intranasal packings were removed and patient expelled the blood clots into a tissue.  Shortly after patient was  reassessed a small amount of venous epistaxis present in the right nostril, no bleeding from the left nostril or into the posterior oropharynx.  I then placed bacitracin ointment on to a posterior nasal pack and placed and inflated in the right nostril.  After 1 hour patient was reassessed, no bleeding.  Reports she is feeling well she has no complaints.  Left nostril and posterior  oropharynx is clear.  A CBC was obtained and is within normal limits.  She denies any symptoms concerning for anemia.  She has been placed on doxycycline to avoid TSS and referred to ENT, instructed to call their office today to schedule an appointment for later on this week for reassessment.  At this time there does not appear to be any evidence of an acute emergency medical condition and the patient appears stable for discharge with appropriate outpatient follow up. Diagnosis was discussed with patient who verbalizes understanding of care plan and is agreeable to discharge. I have discussed return precautions with patient who verbalizes understanding of return precautions. Patient encouraged to follow-up with their PCP and ENT. All questions answered.  Patient was seen and evaluated by Dr. Sabra Heck during this visit.  Note: Portions of this report may have been transcribed using voice recognition software. Every effort was made to ensure accuracy; however, inadvertent computerized transcription errors may still be present. Final Clinical Impression(s) / ED Diagnoses Final diagnoses:  Acute posterior epistaxis    Rx / DC Orders ED Discharge Orders         Ordered    doxycycline (VIBRAMYCIN) 100 MG capsule  2 times daily     03/26/19 1252           Gari Crown 03/26/19 1309    Noemi Chapel, MD 03/26/19 1337    Deliah Boston, PA-C 03/26/19 1354    Noemi Chapel, MD 03/26/19 [phone]

## 2019-03-26 NOTE — Discharge Instructions (Addendum)
You have been diagnosed today with Right-Sided Nosebleed.  At this time there does not appear to be the presence of an emergent medical condition, however there is always the potential for conditions to change. Please read and follow the below instructions.  Please return to the Emergency Department immediately for any new or worsening symptoms. Please be sure to follow up with your Primary Care Provider within one week regarding your visit today; please call their office to schedule an appointment even if you are feeling better for a follow-up visit. Please call the ENT specialist Dr. Annalee Genta on your discharge paperwork today to schedule a follow-up appointment and reassessment of your nosebleed.  Please take the antibiotic doxycycline as prescribed to avoid infection.  Please drink plenty water and get plenty of rest.  Get help right away if: You have a nosebleed after you fall or hurt your head. Your nosebleed does not go away after 20 minutes. You feel dizzy or weak. You have unusual bleeding from other parts of your body. You have unusual bruising on other parts of your body. You get sweaty. You throw up blood. You have any new/concerning or worsening of symptoms.  Please read the additional information packets attached to your discharge summary.  Do not take your medicine if  develop an itchy rash, swelling in your mouth or lips, or difficulty breathing; call 911 and seek immediate emergency medical attention if this occurs.  Note: Portions of this text may have been transcribed using voice recognition software. Every effort was made to ensure accuracy; however, inadvertent computerized transcription errors may still be present.

## 2019-03-26 NOTE — ED Notes (Signed)
Bleeding under control at this time with packing in place in right nostril.  Patient states she feels much better now

## 2019-03-26 NOTE — ED Triage Notes (Signed)
Woke up at 4am with a nose bleed.  Went to [facility] and had nose packed.  She walked out and came here.

## 2019-03-28 DIAGNOSIS — R040 Epistaxis: Secondary | ICD-10-CM | POA: Diagnosis not present

## 2019-03-28 DIAGNOSIS — Z7902 Long term (current) use of antithrombotics/antiplatelets: Secondary | ICD-10-CM | POA: Diagnosis not present

## 2019-03-29 ENCOUNTER — Telehealth: Payer: Self-pay | Admitting: Internal Medicine

## 2019-03-29 NOTE — Telephone Encounter (Signed)
The pt is over 1 year from intervention  Stent was at a bifurcation. Probably is OK to hold plavix   Would resume ASA when able from bleeding standpoint

## 2019-03-29 NOTE — Telephone Encounter (Signed)
I spoke with patient and gave her Dr.Ross's advice. Will keep f/u apt in feb 2021

## 2019-03-29 NOTE — Telephone Encounter (Signed)
Pt was in the ER at [facility] due to a severe nose bleed and then came to [facility]--   She would like to know if she should stop the clopidogrel (PLAVIX) 75 MG tablet [[id number]]  And they told her to stop the baby Asprin.  She's currently still having a problem w/ the bleeding   Please give pt a call @ [phone]

## 2019-03-29 NOTE — Telephone Encounter (Signed)
Requested ER notes from [facility]. Pt was advised by ER physician to schedule an appointment with ENT, which she went yesterday and was told to return on Monday as it was too soon to take the plug out of her nose. She has stopped taking the aspirin 81 mg, but is asking if she could possibly stop her plavix as well. Please advise.

## 2019-04-01 DIAGNOSIS — Z7902 Long term (current) use of antithrombotics/antiplatelets: Secondary | ICD-10-CM | POA: Diagnosis not present

## 2019-04-01 DIAGNOSIS — R040 Epistaxis: Secondary | ICD-10-CM | POA: Diagnosis not present

## 2019-04-05 DIAGNOSIS — F17210 Nicotine dependence, cigarettes, uncomplicated: Secondary | ICD-10-CM | POA: Diagnosis not present

## 2019-04-05 DIAGNOSIS — I2510 Atherosclerotic heart disease of native coronary artery without angina pectoris: Secondary | ICD-10-CM | POA: Diagnosis not present

## 2019-04-05 DIAGNOSIS — I10 Essential (primary) hypertension: Secondary | ICD-10-CM | POA: Diagnosis not present

## 2019-04-05 DIAGNOSIS — J449 Chronic obstructive pulmonary disease, unspecified: Secondary | ICD-10-CM | POA: Diagnosis not present

## 2019-04-05 DIAGNOSIS — Z299 Encounter for prophylactic measures, unspecified: Secondary | ICD-10-CM | POA: Diagnosis not present

## 2019-04-08 ENCOUNTER — Encounter: Payer: Self-pay | Admitting: Internal Medicine

## 2019-04-08 ENCOUNTER — Other Ambulatory Visit: Payer: Self-pay

## 2019-04-08 ENCOUNTER — Ambulatory Visit (INDEPENDENT_AMBULATORY_CARE_PROVIDER_SITE_OTHER): Payer: Medicare Other | Admitting: Internal Medicine

## 2019-04-08 VITALS — BP 143/65 | HR 75 | Temp 98.0°F | Ht 67.0 in | Wt 184.0 lb

## 2019-04-08 DIAGNOSIS — R040 Epistaxis: Secondary | ICD-10-CM | POA: Diagnosis not present

## 2019-04-08 DIAGNOSIS — I10 Essential (primary) hypertension: Secondary | ICD-10-CM

## 2019-04-08 DIAGNOSIS — E782 Mixed hyperlipidemia: Secondary | ICD-10-CM | POA: Diagnosis not present

## 2019-04-08 DIAGNOSIS — I2510 Atherosclerotic heart disease of native coronary artery without angina pectoris: Secondary | ICD-10-CM

## 2019-04-08 MED ORDER — METOPROLOL SUCCINATE ER 25 MG PO TB24: 12.5000 mg | ORAL_TABLET | Freq: Two times a day (BID) | ORAL | 11 refills | Status: DC

## 2019-04-08 NOTE — Patient Instructions (Signed)
Medication Instructions:  Your physician has recommended you make the following change in your medication:  Increase Toprol XL to 12.5 mg Two Times Daily   *If you need a refill on your cardiac medications before your next appointment, please call your pharmacy*  Lab Work: NONE   If you have labs (blood work) drawn today and your tests are completely normal, you will receive your results only by: Marland Kitchen MyChart Message (if you have MyChart) OR . A paper copy in the mail If you have any lab test that is abnormal or we need to change your treatment, we will call you to review the results.  Testing/Procedures: NONE   Follow-Up: At [facility], you and your health needs are our priority.  As part of our continuing mission to provide you with exceptional heart care, we have created designated Provider Care Teams.  These Care Teams include your primary Cardiologist (physician) and Advanced Practice Providers (APPs -  Physician Assistants and Nurse Practitioners) who all work together to provide you with the care you need, when you need it.  Your next appointment:   1 year(s)  The format for your next appointment:   In Person  Provider:   Dietrich Pates, MD  Other Instructions Thank you for choosing [facility]!

## 2019-04-08 NOTE — Progress Notes (Signed)
Cardiology Office Note   Date:  04/08/2019   ID:  Lisa [name], DOB Jan 30, 1946, MRN [id number]  PCP:  Kirstie Peri, MD  Cardiologist:   Dietrich Pates, MD   Pt returns for f/u of CAD   History of Present Illness: Lisa [name] is a 74 y.o. female with a history of CAD  Pt also has a history of COPD and tob use Last cardiac intervention was in July 2018:  RCA 25%   OM2 90%   Dis LC 90% (bifurcatoin lesion)  PCI/Stent to LCx / OM bifurcation   Rx ASA and Plavix (didn't tolerate Brilinta)   Admitted in Dec 2018 with CP   R/O for MI   CP atypical  Felt due to COPD   I saw the pt in Jan 2020      She was seen in ED earlier in Jan with nose bleed   She said it was terrible  Nose had  to be packed  Seen by ENT  At that time she was taken offo of ASA and Plavix   She says she is now back on ASA  She was recently started on Toprol XL 12.5 as BP was up/down        Current Meds  Medication Sig  . acetaminophen (TYLENOL) 500 MG tablet Take 1,000 mg by mouth every 6 (six) hours as needed for moderate pain.  Marland Kitchen aspirin EC 81 MG EC tablet Take 1 tablet (81 mg total) by mouth daily.  . Chlorpheniramine Maleate (ALLERGY PO) Take 1 tablet by mouth daily. Equate brand  . Cholecalciferol (VITAMIN D3) 2000 units TABS Take 2,000 Units by mouth daily.  . Cinnamon 500 MG capsule Take 500 mg by mouth daily.   . clopidogrel (PLAVIX) 75 MG tablet TAKE 4 TABLETS BY MOUTH AS A ONE TIME DOSE THEN  TAKE  1  TABLET  DAILY  AFTERWARDS  . esomeprazole (NEXIUM) 40 MG capsule Take 40 mg by mouth daily.  . fluticasone-salmeterol (ADVAIR HFA) 115-21 MCG/ACT inhaler Inhale 2 puffs into the lungs daily as needed (shortness of breath).  . nitroGLYCERIN (NITROSTAT) 0.4 MG SL tablet Place 1 tablet (0.4 mg total) under the tongue every 5 (five) minutes as needed for chest pain.  . Omega-3 Fatty Acids (FISH OIL OMEGA-3 PO) Take 1 capsule by mouth daily.  . Probiotic Product (PROBIOTIC PO) Take 1 capsule by mouth daily.  .  rosuvastatin (CRESTOR) 5 MG tablet Take 0.5 tablets (2.5 mg total) by mouth daily.  . traMADol (ULTRAM) 50 MG tablet Take 1 tablet (50 mg total) by mouth every 6 (six) hours as needed.  . VENTOLIN HFA 108 (90 Base) MCG/ACT inhaler Inhale 2 puffs into the lungs every 6 (six) hours as needed for wheezing or shortness of breath.   . vitamin B-12 (CYANOCOBALAMIN) 500 MCG tablet Take 500 mcg by mouth daily.  . vitamin C (ASCORBIC ACID) 500 MG tablet Take 500 mg by mouth daily.     Allergies:   Brilinta [ticagrelor], Penicillins, Ranexa [ranolazine], and Sulfa antibiotics   Past Medical History:  Diagnosis Date  . Asthma   . COPD (chronic obstructive pulmonary disease) (HCC)   . Coronary artery disease   . Cough    " ALL MY LIFE "  . Dyspnea   . GERD (gastroesophageal reflux disease)   . NSTEMI (non-ST elevated myocardial infarction) (HCC) 09/01/2016   DES to Cx/OM bifurcation  . Unstable angina (HCC) 08/2016    Past Surgical History:  Procedure Laterality  Date  . ABDOMINAL HYSTERECTOMY    . CORONARY STENT INTERVENTION  09/05/2016   Successful complex PCI of the circumflex/OM bifurcation using a Synergy DES  . CORONARY STENT INTERVENTION N/A 09/05/2016   Procedure: Coronary Stent Intervention;  Surgeon: Tonny Bollman, MD;  Location: [facility] INVASIVE CV LAB;  Service: Cardiovascular;  Laterality: N/A;  . LEFT HEART CATH AND CORONARY ANGIOGRAPHY N/A 09/02/2016   Procedure: Left Heart Cath and Coronary Angiography;  Surgeon: Corky Crafts, MD;  Location: [facility] INVASIVE CV LAB;  Service: Cardiovascular;  Laterality: N/A;     Social History:  The patient  reports that she has been smoking cigarettes. She has a 25.00 pack-year smoking history. She has never used smokeless tobacco. She reports current alcohol use. She reports that she does not use drugs.   Family History:  The patient's family history includes Asthma in her mother; Heart attack (age of onset: 77) in her father; Heart attack  (age of onset: 71) in her son; Prostate cancer in her brother.    ROS:  Please see the history of present illness. All other systems are reviewed and  Negative to the above problem except as noted.    PHYSICAL EXAM: VS:  BP (!) 143/65   Pulse 75   Temp 98 F (36.7 C) (Temporal)   Ht 5\' 7"  (1.702 m)   Wt 184 lb (83.5 kg)   LMP 09/01/2016 (LMP Unknown)   SpO2 96%   BMI 28.82 kg/m   GEN: Well nourished, well developed, in no acute distress  HEENT: normal  Neck: no JVD, carotid bruits  Cardiac: RRR; no murmurs, rubs, or gallops,  No edema  Respiratory:  Rhonchi bilaterally    GI: soft, nontender, nondistended, + BS  No hepatomegaly  MS: no deformity Moving all extremities   Skin: warm and dry, no rash Neuro:  Strength and sensation are intact Psych: euthymic mood, full affect   EKG:  EKG is not ordered today.   Lipid Panel    Component Value Date/Time   CHOL 144 03/02/2018 1352   TRIG 90 03/02/2018 1352   HDL 41 03/02/2018 1352   CHOLHDL 3.5 03/02/2018 1352   VLDL 18 03/02/2018 1352   LDLCALC 85 03/02/2018 1352      Wt Readings from Last 3 Encounters:  04/08/19 184 lb (83.5 kg)  03/26/19 168 lb (76.2 kg)  03/02/18 179 lb (81.2 kg)      ASSESSMENT AND PLAN:  1  CAD   Pt is s/p PTCA/DES to LCx and OM (pant leg type lesion  Had been on ASA and plavix long term because of this   with nose bleed she stpped  Bleed required nose packing to finally stop.   She is now back on ASA   No symptoms of angina   I would octinue  2  HTN  Recently started on low dose toprol XL  Her BP is still up a little   Will increase toprol to 12.5 bid  Follow response    3  HL   Will check on lipds   She had labs done by Dr 05/01/18 not long ago  4   Edema Denies   At present   Pt hs lasix/K to take as needed 5   Tob  Counselled again  on cessation  6   Exercise  Stay active       Current medicines are reviewed at length with the patient today.  The patient does not have concerns regarding  medicines.  Signed, Dorris Carnes, MD  04/08/2019 1:08 PM    [facility] [place], [place], [place]  [address] Phone: [phone]; Fax: [phone]

## 2019-04-30 DIAGNOSIS — I10 Essential (primary) hypertension: Secondary | ICD-10-CM | POA: Diagnosis not present

## 2019-04-30 DIAGNOSIS — Z299 Encounter for prophylactic measures, unspecified: Secondary | ICD-10-CM | POA: Diagnosis not present

## 2019-04-30 DIAGNOSIS — I509 Heart failure, unspecified: Secondary | ICD-10-CM | POA: Diagnosis not present

## 2019-04-30 DIAGNOSIS — J309 Allergic rhinitis, unspecified: Secondary | ICD-10-CM | POA: Diagnosis not present

## 2019-05-02 ENCOUNTER — Ambulatory Visit: Payer: Medicare Other | Attending: Internal Medicine

## 2019-05-02 DIAGNOSIS — Z23 Encounter for immunization: Secondary | ICD-10-CM | POA: Insufficient documentation

## 2019-05-02 NOTE — Progress Notes (Signed)
   Covid-19 Vaccination Clinic  Name:  Lisa [name]    MRN: [id number] DOB: 10/03/1945  05/02/2019  [name] was observed post Covid-19 immunization for 15 minutes without incident. She was provided with Vaccine Information Sheet and instruction to access the V-Safe system.   [name] was instructed to call 911 with any severe reactions post vaccine: Marland Kitchen Difficulty breathing  . Swelling of face and throat  . A fast heartbeat  . A bad rash all over body  . Dizziness and weakness   Immunizations Administered    Name Date Dose VIS Date Route   Moderna COVID-19 Vaccine 05/02/2019 11:41 AM 0.5 mL 01/29/2019 Intramuscular   Manufacturer: Moderna   Lot: [id number]   NDC: 12878-676-72

## 2019-06-04 ENCOUNTER — Ambulatory Visit: Payer: Medicare Other | Attending: Internal Medicine

## 2019-06-04 DIAGNOSIS — Z23 Encounter for immunization: Secondary | ICD-10-CM

## 2019-06-04 NOTE — Progress Notes (Signed)
   Covid-19 Vaccination Clinic  Name:  Jasmeet Manton    MRN: [id number] DOB: 1945/06/21  06/04/2019  Ms. Reinwald was observed post Covid-19 immunization for 30 minutes based on pre-vaccination screening without incident. She was provided with Vaccine Information Sheet and instruction to access the V-Safe system.   Ms. Pietsch was instructed to call 911 with any severe reactions post vaccine: Marland Kitchen Difficulty breathing  . Swelling of face and throat  . A fast heartbeat  . A bad rash all over body  . Dizziness and weakness   Immunizations Administered    Name Date Dose VIS Date Route   Moderna COVID-19 Vaccine 06/04/2019 10:52 AM 0.5 mL 01/29/2019 Intramuscular   Manufacturer: Gala Murdoch   Lot: [id number]   NDC: 56720-919-80

## 2019-09-17 DIAGNOSIS — H1031 Unspecified acute conjunctivitis, right eye: Secondary | ICD-10-CM | POA: Diagnosis not present

## 2019-09-17 DIAGNOSIS — H18831 Recurrent erosion of cornea, right eye: Secondary | ICD-10-CM | POA: Diagnosis not present

## 2019-10-07 DIAGNOSIS — I10 Essential (primary) hypertension: Secondary | ICD-10-CM | POA: Diagnosis not present

## 2019-10-07 DIAGNOSIS — Z299 Encounter for prophylactic measures, unspecified: Secondary | ICD-10-CM | POA: Diagnosis not present

## 2019-10-07 DIAGNOSIS — F17210 Nicotine dependence, cigarettes, uncomplicated: Secondary | ICD-10-CM | POA: Diagnosis not present

## 2019-10-21 DIAGNOSIS — J449 Chronic obstructive pulmonary disease, unspecified: Secondary | ICD-10-CM | POA: Diagnosis not present

## 2019-10-21 DIAGNOSIS — I10 Essential (primary) hypertension: Secondary | ICD-10-CM | POA: Diagnosis not present

## 2019-10-21 DIAGNOSIS — I509 Heart failure, unspecified: Secondary | ICD-10-CM | POA: Diagnosis not present

## 2019-10-21 DIAGNOSIS — E261 Secondary hyperaldosteronism: Secondary | ICD-10-CM | POA: Diagnosis not present

## 2019-10-21 DIAGNOSIS — Z299 Encounter for prophylactic measures, unspecified: Secondary | ICD-10-CM | POA: Diagnosis not present

## 2019-11-01 DIAGNOSIS — H10211 Acute toxic conjunctivitis, right eye: Secondary | ICD-10-CM | POA: Diagnosis not present

## 2019-11-15 DIAGNOSIS — Z299 Encounter for prophylactic measures, unspecified: Secondary | ICD-10-CM | POA: Diagnosis not present

## 2019-11-15 DIAGNOSIS — F17210 Nicotine dependence, cigarettes, uncomplicated: Secondary | ICD-10-CM | POA: Diagnosis not present

## 2019-11-15 DIAGNOSIS — Z79899 Other long term (current) drug therapy: Secondary | ICD-10-CM | POA: Diagnosis not present

## 2019-11-15 DIAGNOSIS — R5383 Other fatigue: Secondary | ICD-10-CM | POA: Diagnosis not present

## 2019-11-15 DIAGNOSIS — H6121 Impacted cerumen, right ear: Secondary | ICD-10-CM | POA: Diagnosis not present

## 2019-11-15 DIAGNOSIS — Z7189 Other specified counseling: Secondary | ICD-10-CM | POA: Diagnosis not present

## 2019-11-15 DIAGNOSIS — I10 Essential (primary) hypertension: Secondary | ICD-10-CM | POA: Diagnosis not present

## 2019-11-15 DIAGNOSIS — Z0000 Encounter for general adult medical examination without abnormal findings: Secondary | ICD-10-CM | POA: Diagnosis not present

## 2019-11-15 DIAGNOSIS — E7800 Pure hypercholesterolemia, unspecified: Secondary | ICD-10-CM | POA: Diagnosis not present

## 2019-12-12 DIAGNOSIS — J020 Streptococcal pharyngitis: Secondary | ICD-10-CM | POA: Diagnosis not present

## 2019-12-12 DIAGNOSIS — Z20822 Contact with and (suspected) exposure to covid-19: Secondary | ICD-10-CM | POA: Diagnosis not present

## 2019-12-12 DIAGNOSIS — R059 Cough, unspecified: Secondary | ICD-10-CM | POA: Diagnosis not present

## 2019-12-12 DIAGNOSIS — J069 Acute upper respiratory infection, unspecified: Secondary | ICD-10-CM | POA: Diagnosis not present

## 2020-01-06 DIAGNOSIS — Z299 Encounter for prophylactic measures, unspecified: Secondary | ICD-10-CM | POA: Diagnosis not present

## 2020-01-06 DIAGNOSIS — J329 Chronic sinusitis, unspecified: Secondary | ICD-10-CM | POA: Diagnosis not present

## 2020-01-06 DIAGNOSIS — J449 Chronic obstructive pulmonary disease, unspecified: Secondary | ICD-10-CM | POA: Diagnosis not present

## 2020-01-06 DIAGNOSIS — F32A Depression, unspecified: Secondary | ICD-10-CM | POA: Diagnosis not present

## 2020-01-06 DIAGNOSIS — F17210 Nicotine dependence, cigarettes, uncomplicated: Secondary | ICD-10-CM | POA: Diagnosis not present

## 2020-03-17 DIAGNOSIS — Z299 Encounter for prophylactic measures, unspecified: Secondary | ICD-10-CM | POA: Diagnosis not present

## 2020-03-17 DIAGNOSIS — E261 Secondary hyperaldosteronism: Secondary | ICD-10-CM | POA: Diagnosis not present

## 2020-03-17 DIAGNOSIS — I2510 Atherosclerotic heart disease of native coronary artery without angina pectoris: Secondary | ICD-10-CM | POA: Diagnosis not present

## 2020-03-17 DIAGNOSIS — J449 Chronic obstructive pulmonary disease, unspecified: Secondary | ICD-10-CM | POA: Diagnosis not present

## 2020-03-17 DIAGNOSIS — I10 Essential (primary) hypertension: Secondary | ICD-10-CM | POA: Diagnosis not present

## 2020-03-23 DIAGNOSIS — I509 Heart failure, unspecified: Secondary | ICD-10-CM | POA: Diagnosis not present

## 2020-03-23 DIAGNOSIS — Z299 Encounter for prophylactic measures, unspecified: Secondary | ICD-10-CM | POA: Diagnosis not present

## 2020-03-23 DIAGNOSIS — I10 Essential (primary) hypertension: Secondary | ICD-10-CM | POA: Diagnosis not present

## 2020-03-23 DIAGNOSIS — F17210 Nicotine dependence, cigarettes, uncomplicated: Secondary | ICD-10-CM | POA: Diagnosis not present

## 2020-03-27 ENCOUNTER — Ambulatory Visit (INDEPENDENT_AMBULATORY_CARE_PROVIDER_SITE_OTHER): Payer: Medicare Other | Admitting: Student

## 2020-03-27 ENCOUNTER — Other Ambulatory Visit: Payer: Self-pay

## 2020-03-27 ENCOUNTER — Encounter: Payer: Self-pay | Admitting: Student

## 2020-03-27 VITALS — BP 130/70 | HR 78 | Ht 66.5 in | Wt 182.0 lb

## 2020-03-27 DIAGNOSIS — I10 Essential (primary) hypertension: Secondary | ICD-10-CM

## 2020-03-27 DIAGNOSIS — I2510 Atherosclerotic heart disease of native coronary artery without angina pectoris: Secondary | ICD-10-CM

## 2020-03-27 DIAGNOSIS — E785 Hyperlipidemia, unspecified: Secondary | ICD-10-CM

## 2020-03-27 NOTE — Patient Instructions (Signed)
Medication Instructions:  Your physician recommends that you continue on your current medications as directed. Please refer to the Current Medication list given to you today.  *If you need a refill on your cardiac medications before your next appointment, please call your pharmacy*   Lab Work: Labs were requested from you Primary Care Doctor If you have labs (blood work) drawn today and your tests are completely normal, you will receive your results only by: Marland Kitchen MyChart Message (if you have MyChart) OR . A paper copy in the mail If you have any lab test that is abnormal or we need to change your treatment, we will call you to review the results.   Testing/Procedures: None Today   Follow-Up: At [facility], you and your health needs are our priority.  As part of our continuing mission to provide you with exceptional heart care, we have created designated Provider Care Teams.  These Care Teams include your primary Cardiologist (physician) and Advanced Practice Providers (APPs -  Physician Assistants and Nurse Practitioners) who all work together to provide you with the care you need, when you need it.  We recommend signing up for the patient portal called "MyChart".  Sign up information is provided on this After Visit Summary.  MyChart is used to connect with patients for Virtual Visits (Telemedicine).  Patients are able to view lab/test results, encounter notes, upcoming appointments, etc.  Non-urgent messages can be sent to your provider as well.   To learn more about what you can do with MyChart, go to [email or web address].    Your next appointment:   12 year(s)  The format for your next appointment:   In Person  Provider:   You may see Dietrich Pates, MD or one of the following Advanced Practice Providers on your designated Care Team:    Randall An, [place]    \

## 2020-03-27 NOTE — Progress Notes (Signed)
Cardiology Office Note    Date:  03/28/2020   ID:  Lisa [name], DOB 1946/02/16, MRN [id number]  PCP:  Kirstie Peri, MD  Cardiologist: Dietrich Pates, MD    Chief Complaint  Patient presents with  . Follow-up    Annual Visit    History of Present Illness:    Lisa [name] is a 76 y.o. female with past medical history of CAD (s/p DES to LCx/OM bifurcation in 08/2016), COPD, HTN, HLD and tobacco use who presents to the office today for annual follow-up.  She was last examined by Dr. Tenny Craw in 04/2019 and had recently been evaluated in the ED for epistaxis and ENT recommended being off ASA and Plavix at that time. She had restarted ASA at the time of her visit and was overall tolerating it well. Her BP was slightly elevated and Toprol-XL was increased to 12.5 mg twice daily along with her being continued on ASA 81 mg daily and Crestor 2.5 mg daily.  In talking with the patient today, she reports overall doing well since her last visit. She denies any recent chest pain or dyspnea on exertion. Remains active in doing chores around the house. No recent palpitations, orthopnea, PND or lower extremity edema. Her BP was elevated earlier this month and her PCP started her on Lisinopril and this was titrated to 20mg  daily 4 days ago. She has been tolerating this well without side effects. She feels like her elevated BP might be secondary to stress as her daughter has been experiencing progressive medical issues.  Past Medical History:  Diagnosis Date  . Asthma   . COPD (chronic obstructive pulmonary disease) (HCC)   . Coronary artery disease   . Cough    " ALL MY LIFE "  . Dyspnea   . GERD (gastroesophageal reflux disease)   . NSTEMI (non-ST elevated myocardial infarction) (HCC) 09/01/2016   DES to Cx/OM bifurcation  . Unstable angina (HCC) 08/2016    Past Surgical History:  Procedure Laterality Date  . ABDOMINAL HYSTERECTOMY    . CORONARY STENT INTERVENTION  09/05/2016   Successful  complex PCI of the circumflex/OM bifurcation using a Synergy DES  . CORONARY STENT INTERVENTION N/A 09/05/2016   Procedure: Coronary Stent Intervention;  Surgeon: 11/06/2016, MD;  Location: [facility] INVASIVE CV LAB;  Service: Cardiovascular;  Laterality: N/A;  . LEFT HEART CATH AND CORONARY ANGIOGRAPHY N/A 09/02/2016   Procedure: Left Heart Cath and Coronary Angiography;  Surgeon: 11/03/2016, MD;  Location: [facility] INVASIVE CV LAB;  Service: Cardiovascular;  Laterality: N/A;    Current Medications: Outpatient Medications Prior to Visit  Medication Sig Dispense Refill  . acetaminophen (TYLENOL) 500 MG tablet Take 1,000 mg by mouth every 6 (six) hours as needed for moderate pain.    [facility] aspirin EC 81 MG EC tablet Take 1 tablet (81 mg total) by mouth daily. 90 tablet 3  . Chlorpheniramine Maleate (ALLERGY PO) Take 1 tablet by mouth daily. Equate brand    . Cholecalciferol (VITAMIN D3) 2000 units TABS Take 2,000 Units by mouth daily.    Marland Kitchen esomeprazole (NEXIUM) 40 MG capsule Take 40 mg by mouth daily.    . fluticasone-salmeterol (ADVAIR HFA) 115-21 MCG/ACT inhaler Inhale 2 puffs into the lungs daily as needed (shortness of breath).    . metoprolol succinate (TOPROL XL) 25 MG 24 hr tablet Take 0.5 tablets (12.5 mg total) by mouth 2 (two) times daily. 30 tablet 11  . nitroGLYCERIN (NITROSTAT) 0.4 MG SL tablet Place  1 tablet (0.4 mg total) under the tongue every 5 (five) minutes as needed for chest pain. 25 tablet 1  . Probiotic Product (PROBIOTIC PO) Take 1 capsule by mouth daily.    . VENTOLIN HFA 108 (90 Base) MCG/ACT inhaler Inhale 2 puffs into the lungs every 6 (six) hours as needed for wheezing or shortness of breath.     . vitamin B-12 (CYANOCOBALAMIN) 500 MCG tablet Take 500 mcg by mouth daily.    . vitamin C (ASCORBIC ACID) 500 MG tablet Take 500 mg by mouth daily.    . clopidogrel (PLAVIX) 75 MG tablet TAKE 4 TABLETS BY MOUTH AS A ONE TIME DOSE THEN  TAKE  1  TABLET  DAILY  AFTERWARDS 90 tablet  1  . Omega-3 Fatty Acids (FISH OIL OMEGA-3 PO) Take 1 capsule by mouth daily.    . diazepam (VALIUM) 2 MG tablet Take 2 mg by mouth 2 (two) times daily.    Marland Kitchen lisinopril (ZESTRIL) 20 MG tablet Take 20 mg by mouth daily.    . Cinnamon 500 MG capsule Take 500 mg by mouth daily.     . rosuvastatin (CRESTOR) 5 MG tablet Take 0.5 tablets (2.5 mg total) by mouth daily. 30 tablet 3  . traMADol (ULTRAM) 50 MG tablet Take 1 tablet (50 mg total) by mouth every 6 (six) hours as needed. 8 tablet 0   No facility-administered medications prior to visit.     Allergies:   Brilinta [ticagrelor], Penicillins, Ranexa [ranolazine], Crestor [rosuvastatin], and Sulfa antibiotics   Social History   Socioeconomic History  . Marital status: Divorced    Spouse name: Not on file  . Number of children: Not on file  . Years of education: Not on file  . Highest education level: Not on file  Occupational History  . Not on file  Tobacco Use  . Smoking status: Current Some Day Smoker    Packs/day: 0.50    Years: 50.00    Pack years: 25.00    Types: Cigarettes  . Smokeless tobacco: Never Used  Vaping Use  . Vaping Use: Never used  Substance and Sexual Activity  . Alcohol use: Yes    Comment: occasional  . Drug use: No  . Sexual activity: Yes  Other Topics Concern  . Not on file  Social History Narrative  . Not on file   Social Determinants of Health   Financial Resource Strain: Not on file  Food Insecurity: Not on file  Transportation Needs: Not on file  Physical Activity: Not on file  Stress: Not on file  Social Connections: Not on file     Family History:  The patient's family history includes Asthma in her mother; Heart attack (age of onset: 59) in her father; Heart attack (age of onset: 42) in her son; Prostate cancer in her brother.   Review of Systems:   Please see the history of present illness.     General:  No chills, fever, night sweats or weight changes.  Cardiovascular:  No chest  pain, dyspnea on exertion, edema, orthopnea, palpitations, paroxysmal nocturnal dyspnea. Dermatological: No rash, lesions/masses Respiratory: No cough, dyspnea Urologic: No hematuria, dysuria Abdominal:   No nausea, vomiting, diarrhea, bright red blood per rectum, melena, or hematemesis Neurologic:  No visual changes, wkns, changes in mental status. All other systems reviewed and are otherwise negative except as noted above.   Physical Exam:    VS:  BP 130/70   Pulse 78   Ht 5' 6.5" (  1.689 m)   Wt 182 lb (82.6 kg)   LMP 09/01/2016 (LMP Unknown)   SpO2 96%   BMI 28.94 kg/m    General: Well developed, well nourished,female appearing in no acute distress. Head: Normocephalic, atraumatic. Neck: No carotid bruits. JVD not elevated.  Lungs: Respirations regular and unlabored, without wheezes or rales.  Heart: Regular rate and rhythm. No S3 or S4.  No murmur, no rubs, or gallops appreciated. Abdomen: Appears non-distended. No obvious abdominal masses. Msk:  Strength and tone appear normal for age. No obvious joint deformities or effusions. Extremities: No clubbing or cyanosis. No lower extremity edema.  Distal pedal pulses are 2+ bilaterally. Neuro: Alert and oriented X 3. Moves all extremities spontaneously. No focal deficits noted. Psych:  Responds to questions appropriately with a normal affect. Skin: No rashes or lesions noted  Wt Readings from Last 3 Encounters:  03/27/20 182 lb (82.6 kg)  04/08/19 184 lb (83.5 kg)  03/26/19 168 lb (76.2 kg)    Studies/Labs Reviewed:   EKG:  EKG is ordered today. The EKG ordered today demonstrates NSR, HR 61 with PAC's. No acute ST abnormalities.   Recent Labs: No results found for requested labs within last 8760 hours.   Lipid Panel    Component Value Date/Time   CHOL 144 03/02/2018 1352   TRIG 90 03/02/2018 1352   HDL 41 03/02/2018 1352   CHOLHDL 3.5 03/02/2018 1352   VLDL 18 03/02/2018 1352   LDLCALC 85 03/02/2018 1352     Additional studies/ records that were reviewed today include:   Echocardiogram: 08/2016 Study Conclusions   - Left ventricle: The cavity size was normal. Wall thickness was  increased in a pattern of mild LVH. Systolic function was normal.  The estimated ejection fraction was in the range of 60% to 65%.  Doppler parameters are consistent with abnormal left ventricular  relaxation (grade 1 diastolic dysfunction).  - Mitral valve: There was mild regurgitation.  - Pulmonary arteries: PA peak pressure: 37 mm Hg (S).   Cardiac Catheterization: 08/2016  Prox RCA to Mid RCA lesion, 25 %stenosed.  Ost 2nd Mrg to 2nd Mrg lesion, 90 %stenosed. Dist Cx lesion, 90 %stenosed. Complex bifurcation lesion.  The left ventricular systolic function is normal.  LV end diastolic pressure is normal.  The left ventricular ejection fraction is 55-65% by visual estimate.  There is no aortic valve stenosis.   Unable to pass 6 Fr catheter through radial approach due to anatomic reasons.    Patient loaded with Brilinta.  Plan for cath/PCI via femoral approach on Monday.     Coronary Stent Intervention: 08/2016 Successful complex PCI of the circumflex/OM bifurcation using a Synergy DES  Recommend: ASA/Brilinta at least 12 months. Should be ok for discharge tomorrow if no complications arise.     Assessment:    1. Coronary artery disease involving native coronary artery of native heart without angina pectoris   2. Essential hypertension   3. Hyperlipidemia LDL goal <70      Plan:   In order of problems listed above:  1. CAD - She is s/p DES to LCx/OM bifurcation in 08/2016. She remains active at baseline and denies any recent chest pain or dyspnea on exertion.  - Continue current medical therapy with ASA 81mg  daily and Toprol-XL 25mg  daily. She has been intolerant to multiple statins in the past as outlined below.  2. HTN - BP is well-controlled at 130/70 during today's  visit. I encouraged her to continue to follow  readings at home given the recent medication changes.  - Continue current regimen with Lisinopril 20mg  daily and Toprol-XL 25mg  daily.   3. HLD - LDL was at 85 in 2020. Will request a copy of most recent labs from her PCP. She has been intolerant to statins and had myalgias with Crestor 2.5mg  daily. She is not interested in options such as Zetia or PCSK-9 inhibitor therapy at this time.    Medication Adjustments/Labs and Tests Ordered: Current medicines are reviewed at length with the patient today.  Concerns regarding medicines are outlined above.  Medication changes, Labs and Tests ordered today are listed in the Patient Instructions below. Patient Instructions  Medication Instructions:  Your physician recommends that you continue on your current medications as directed. Please refer to the Current Medication list given to you today.  *If you need a refill on your cardiac medications before your next appointment, please call your pharmacy*   Lab Work: Labs were requested from you Primary Care Doctor If you have labs (blood work) drawn today and your tests are completely normal, you will receive your results only by: Marland Kitchen MyChart Message (if you have MyChart) OR . A paper copy in the mail If you have any lab test that is abnormal or we need to change your treatment, we will call you to review the results.   Testing/Procedures: None Today   Follow-Up: At [facility], you and your health needs are our priority.  As part of our continuing mission to provide you with exceptional heart care, we have created designated Provider Care Teams.  These Care Teams include your primary Cardiologist (physician) and Advanced Practice Providers (APPs -  Physician Assistants and Nurse Practitioners) who all work together to provide you with the care you need, when you need it.  We recommend signing up for the patient portal called "MyChart".  Sign up  information is provided on this After Visit Summary.  MyChart is used to connect with patients for Virtual Visits (Telemedicine).  Patients are able to view lab/test results, encounter notes, upcoming appointments, etc.  Non-urgent messages can be sent to your provider as well.   To learn more about what you can do with MyChart, go to [email or web address].    Your next appointment:   1 year(s)  The format for your next appointment:   In Person  Provider:   You may see Dietrich Pates, MD or one of the following Advanced Practice Providers on your designated Care Team:    Randall An, PA-C    \       Signed, Ellsworth Lennox, [place]  03/28/2020 10:02 AM    [facility] [address] [place], [place] [address] Phone: [phone] Fax: [phone]

## 2020-03-28 ENCOUNTER — Encounter: Payer: Self-pay | Admitting: Student

## 2020-05-21 DIAGNOSIS — J449 Chronic obstructive pulmonary disease, unspecified: Secondary | ICD-10-CM | POA: Diagnosis not present

## 2020-05-21 DIAGNOSIS — I509 Heart failure, unspecified: Secondary | ICD-10-CM | POA: Diagnosis not present

## 2020-05-21 DIAGNOSIS — E261 Secondary hyperaldosteronism: Secondary | ICD-10-CM | POA: Diagnosis not present

## 2020-05-21 DIAGNOSIS — I10 Essential (primary) hypertension: Secondary | ICD-10-CM | POA: Diagnosis not present

## 2020-05-21 DIAGNOSIS — Z299 Encounter for prophylactic measures, unspecified: Secondary | ICD-10-CM | POA: Diagnosis not present

## 2020-07-30 DIAGNOSIS — B351 Tinea unguium: Secondary | ICD-10-CM | POA: Diagnosis not present

## 2020-07-30 DIAGNOSIS — L609 Nail disorder, unspecified: Secondary | ICD-10-CM | POA: Diagnosis not present

## 2020-07-30 DIAGNOSIS — M79672 Pain in left foot: Secondary | ICD-10-CM | POA: Diagnosis not present

## 2020-07-30 DIAGNOSIS — M79674 Pain in right toe(s): Secondary | ICD-10-CM | POA: Diagnosis not present

## 2020-07-30 DIAGNOSIS — M79671 Pain in right foot: Secondary | ICD-10-CM | POA: Diagnosis not present

## 2020-07-30 DIAGNOSIS — B353 Tinea pedis: Secondary | ICD-10-CM | POA: Diagnosis not present

## 2020-07-30 DIAGNOSIS — M79675 Pain in left toe(s): Secondary | ICD-10-CM | POA: Diagnosis not present

## 2020-08-27 DIAGNOSIS — B353 Tinea pedis: Secondary | ICD-10-CM | POA: Diagnosis not present

## 2020-08-27 DIAGNOSIS — M79671 Pain in right foot: Secondary | ICD-10-CM | POA: Diagnosis not present

## 2020-08-27 DIAGNOSIS — M79672 Pain in left foot: Secondary | ICD-10-CM | POA: Diagnosis not present

## 2020-08-27 DIAGNOSIS — M79675 Pain in left toe(s): Secondary | ICD-10-CM | POA: Diagnosis not present

## 2020-08-27 DIAGNOSIS — M79674 Pain in right toe(s): Secondary | ICD-10-CM | POA: Diagnosis not present

## 2020-08-27 DIAGNOSIS — L609 Nail disorder, unspecified: Secondary | ICD-10-CM | POA: Diagnosis not present

## 2020-08-27 DIAGNOSIS — B351 Tinea unguium: Secondary | ICD-10-CM | POA: Diagnosis not present

## 2020-10-15 DIAGNOSIS — J449 Chronic obstructive pulmonary disease, unspecified: Secondary | ICD-10-CM | POA: Diagnosis not present

## 2020-10-15 DIAGNOSIS — I10 Essential (primary) hypertension: Secondary | ICD-10-CM | POA: Diagnosis not present

## 2020-10-15 DIAGNOSIS — Z299 Encounter for prophylactic measures, unspecified: Secondary | ICD-10-CM | POA: Diagnosis not present

## 2020-10-15 DIAGNOSIS — I509 Heart failure, unspecified: Secondary | ICD-10-CM | POA: Diagnosis not present

## 2020-10-15 DIAGNOSIS — H6121 Impacted cerumen, right ear: Secondary | ICD-10-CM | POA: Diagnosis not present

## 2020-10-15 DIAGNOSIS — E261 Secondary hyperaldosteronism: Secondary | ICD-10-CM | POA: Diagnosis not present

## 2020-11-17 DIAGNOSIS — Z7189 Other specified counseling: Secondary | ICD-10-CM | POA: Diagnosis not present

## 2020-11-17 DIAGNOSIS — Z299 Encounter for prophylactic measures, unspecified: Secondary | ICD-10-CM | POA: Diagnosis not present

## 2020-11-17 DIAGNOSIS — R5383 Other fatigue: Secondary | ICD-10-CM | POA: Diagnosis not present

## 2020-11-17 DIAGNOSIS — I10 Essential (primary) hypertension: Secondary | ICD-10-CM | POA: Diagnosis not present

## 2020-11-17 DIAGNOSIS — Z79899 Other long term (current) drug therapy: Secondary | ICD-10-CM | POA: Diagnosis not present

## 2020-11-17 DIAGNOSIS — Z0000 Encounter for general adult medical examination without abnormal findings: Secondary | ICD-10-CM | POA: Diagnosis not present

## 2020-11-17 DIAGNOSIS — J449 Chronic obstructive pulmonary disease, unspecified: Secondary | ICD-10-CM | POA: Diagnosis not present

## 2020-11-17 DIAGNOSIS — E7800 Pure hypercholesterolemia, unspecified: Secondary | ICD-10-CM | POA: Diagnosis not present

## 2020-11-18 DIAGNOSIS — Z79899 Other long term (current) drug therapy: Secondary | ICD-10-CM | POA: Diagnosis not present

## 2020-11-18 DIAGNOSIS — E7800 Pure hypercholesterolemia, unspecified: Secondary | ICD-10-CM | POA: Diagnosis not present

## 2020-11-18 DIAGNOSIS — R5383 Other fatigue: Secondary | ICD-10-CM | POA: Diagnosis not present

## 2021-02-03 DIAGNOSIS — I10 Essential (primary) hypertension: Secondary | ICD-10-CM | POA: Diagnosis not present

## 2021-02-03 DIAGNOSIS — J209 Acute bronchitis, unspecified: Secondary | ICD-10-CM | POA: Diagnosis not present

## 2021-02-03 DIAGNOSIS — F17210 Nicotine dependence, cigarettes, uncomplicated: Secondary | ICD-10-CM | POA: Diagnosis not present

## 2021-02-03 DIAGNOSIS — Z299 Encounter for prophylactic measures, unspecified: Secondary | ICD-10-CM | POA: Diagnosis not present

## 2021-02-03 DIAGNOSIS — J440 Chronic obstructive pulmonary disease with acute lower respiratory infection: Secondary | ICD-10-CM | POA: Diagnosis not present

## 2021-02-03 DIAGNOSIS — I509 Heart failure, unspecified: Secondary | ICD-10-CM | POA: Diagnosis not present

## 2021-04-08 DIAGNOSIS — H0288B Meibomian gland dysfunction left eye, upper and lower eyelids: Secondary | ICD-10-CM | POA: Diagnosis not present

## 2021-04-08 DIAGNOSIS — H0288A Meibomian gland dysfunction right eye, upper and lower eyelids: Secondary | ICD-10-CM | POA: Diagnosis not present

## 2021-04-08 DIAGNOSIS — H1031 Unspecified acute conjunctivitis, right eye: Secondary | ICD-10-CM | POA: Diagnosis not present

## 2021-06-08 DIAGNOSIS — H0288A Meibomian gland dysfunction right eye, upper and lower eyelids: Secondary | ICD-10-CM | POA: Diagnosis not present

## 2021-06-08 DIAGNOSIS — H0288B Meibomian gland dysfunction left eye, upper and lower eyelids: Secondary | ICD-10-CM | POA: Diagnosis not present

## 2021-07-09 DIAGNOSIS — Z299 Encounter for prophylactic measures, unspecified: Secondary | ICD-10-CM | POA: Diagnosis not present

## 2021-07-09 DIAGNOSIS — H04123 Dry eye syndrome of bilateral lacrimal glands: Secondary | ICD-10-CM | POA: Diagnosis not present

## 2021-07-09 DIAGNOSIS — F17210 Nicotine dependence, cigarettes, uncomplicated: Secondary | ICD-10-CM | POA: Diagnosis not present

## 2021-07-09 DIAGNOSIS — L819 Disorder of pigmentation, unspecified: Secondary | ICD-10-CM | POA: Diagnosis not present

## 2021-07-09 DIAGNOSIS — S61411A Laceration without foreign body of right hand, initial encounter: Secondary | ICD-10-CM | POA: Diagnosis not present

## 2021-07-09 DIAGNOSIS — I10 Essential (primary) hypertension: Secondary | ICD-10-CM | POA: Diagnosis not present

## 2021-10-05 DIAGNOSIS — I509 Heart failure, unspecified: Secondary | ICD-10-CM | POA: Diagnosis not present

## 2021-10-05 DIAGNOSIS — I10 Essential (primary) hypertension: Secondary | ICD-10-CM | POA: Diagnosis not present

## 2021-10-05 DIAGNOSIS — E261 Secondary hyperaldosteronism: Secondary | ICD-10-CM | POA: Diagnosis not present

## 2021-10-05 DIAGNOSIS — Z299 Encounter for prophylactic measures, unspecified: Secondary | ICD-10-CM | POA: Diagnosis not present

## 2021-10-05 DIAGNOSIS — B370 Candidal stomatitis: Secondary | ICD-10-CM | POA: Diagnosis not present

## 2021-10-28 DIAGNOSIS — I10 Essential (primary) hypertension: Secondary | ICD-10-CM | POA: Diagnosis not present

## 2021-10-28 DIAGNOSIS — E7800 Pure hypercholesterolemia, unspecified: Secondary | ICD-10-CM | POA: Diagnosis not present

## 2021-11-19 DIAGNOSIS — R5383 Other fatigue: Secondary | ICD-10-CM | POA: Diagnosis not present

## 2021-11-19 DIAGNOSIS — Z299 Encounter for prophylactic measures, unspecified: Secondary | ICD-10-CM | POA: Diagnosis not present

## 2021-11-19 DIAGNOSIS — Z79899 Other long term (current) drug therapy: Secondary | ICD-10-CM | POA: Diagnosis not present

## 2021-11-19 DIAGNOSIS — E7800 Pure hypercholesterolemia, unspecified: Secondary | ICD-10-CM | POA: Diagnosis not present

## 2021-11-19 DIAGNOSIS — F32A Depression, unspecified: Secondary | ICD-10-CM | POA: Diagnosis not present

## 2021-11-19 DIAGNOSIS — Z23 Encounter for immunization: Secondary | ICD-10-CM | POA: Diagnosis not present

## 2021-11-19 DIAGNOSIS — I10 Essential (primary) hypertension: Secondary | ICD-10-CM | POA: Diagnosis not present

## 2021-11-19 DIAGNOSIS — E559 Vitamin D deficiency, unspecified: Secondary | ICD-10-CM | POA: Diagnosis not present

## 2021-11-19 DIAGNOSIS — K219 Gastro-esophageal reflux disease without esophagitis: Secondary | ICD-10-CM | POA: Diagnosis not present

## 2021-11-19 DIAGNOSIS — F17210 Nicotine dependence, cigarettes, uncomplicated: Secondary | ICD-10-CM | POA: Diagnosis not present

## 2021-11-19 DIAGNOSIS — Z7189 Other specified counseling: Secondary | ICD-10-CM | POA: Diagnosis not present

## 2021-11-19 DIAGNOSIS — Z0000 Encounter for general adult medical examination without abnormal findings: Secondary | ICD-10-CM | POA: Diagnosis not present

## 2021-12-02 DIAGNOSIS — I10 Essential (primary) hypertension: Secondary | ICD-10-CM | POA: Diagnosis not present

## 2021-12-02 DIAGNOSIS — J209 Acute bronchitis, unspecified: Secondary | ICD-10-CM | POA: Diagnosis not present

## 2021-12-02 DIAGNOSIS — I509 Heart failure, unspecified: Secondary | ICD-10-CM | POA: Diagnosis not present

## 2021-12-02 DIAGNOSIS — Z299 Encounter for prophylactic measures, unspecified: Secondary | ICD-10-CM | POA: Diagnosis not present

## 2021-12-02 DIAGNOSIS — J440 Chronic obstructive pulmonary disease with acute lower respiratory infection: Secondary | ICD-10-CM | POA: Diagnosis not present

## 2021-12-03 ENCOUNTER — Ambulatory Visit: Payer: Medicare Other | Admitting: Internal Medicine

## 2021-12-20 DIAGNOSIS — Z299 Encounter for prophylactic measures, unspecified: Secondary | ICD-10-CM | POA: Diagnosis not present

## 2021-12-20 DIAGNOSIS — F17210 Nicotine dependence, cigarettes, uncomplicated: Secondary | ICD-10-CM | POA: Diagnosis not present

## 2021-12-20 DIAGNOSIS — J440 Chronic obstructive pulmonary disease with acute lower respiratory infection: Secondary | ICD-10-CM | POA: Diagnosis not present

## 2021-12-20 DIAGNOSIS — I2510 Atherosclerotic heart disease of native coronary artery without angina pectoris: Secondary | ICD-10-CM | POA: Diagnosis not present

## 2021-12-20 DIAGNOSIS — J209 Acute bronchitis, unspecified: Secondary | ICD-10-CM | POA: Diagnosis not present

## 2021-12-20 DIAGNOSIS — I509 Heart failure, unspecified: Secondary | ICD-10-CM | POA: Diagnosis not present

## 2022-03-29 DIAGNOSIS — I509 Heart failure, unspecified: Secondary | ICD-10-CM | POA: Diagnosis not present

## 2022-03-29 DIAGNOSIS — J440 Chronic obstructive pulmonary disease with acute lower respiratory infection: Secondary | ICD-10-CM | POA: Diagnosis not present

## 2022-03-29 DIAGNOSIS — I10 Essential (primary) hypertension: Secondary | ICD-10-CM | POA: Diagnosis not present

## 2022-03-29 DIAGNOSIS — Z299 Encounter for prophylactic measures, unspecified: Secondary | ICD-10-CM | POA: Diagnosis not present

## 2022-03-29 DIAGNOSIS — L409 Psoriasis, unspecified: Secondary | ICD-10-CM | POA: Diagnosis not present

## 2022-04-03 ENCOUNTER — Emergency Department (HOSPITAL_COMMUNITY): Payer: Medicare Other

## 2022-04-03 ENCOUNTER — Emergency Department (HOSPITAL_COMMUNITY)
Admission: EM | Admit: 2022-04-03 | Discharge: 2022-04-03 | Disposition: A | Discharge status: Home / Self Care | Payer: Medicare Other | Attending: Emergency Medicine | Admitting: Emergency Medicine

## 2022-04-03 ENCOUNTER — Other Ambulatory Visit: Payer: Self-pay

## 2022-04-03 ENCOUNTER — Encounter (HOSPITAL_COMMUNITY): Payer: Self-pay

## 2022-04-03 DIAGNOSIS — R197 Diarrhea, unspecified: Secondary | ICD-10-CM | POA: Diagnosis present

## 2022-04-03 DIAGNOSIS — Z79899 Other long term (current) drug therapy: Secondary | ICD-10-CM | POA: Insufficient documentation

## 2022-04-03 DIAGNOSIS — I10 Essential (primary) hypertension: Secondary | ICD-10-CM

## 2022-04-03 DIAGNOSIS — R1030 Lower abdominal pain, unspecified: Secondary | ICD-10-CM | POA: Insufficient documentation

## 2022-04-03 DIAGNOSIS — Z7982 Long term (current) use of aspirin: Secondary | ICD-10-CM | POA: Insufficient documentation

## 2022-04-03 DIAGNOSIS — J449 Chronic obstructive pulmonary disease, unspecified: Secondary | ICD-10-CM | POA: Insufficient documentation

## 2022-04-03 DIAGNOSIS — R109 Unspecified abdominal pain: Secondary | ICD-10-CM | POA: Diagnosis not present

## 2022-04-03 DIAGNOSIS — E860 Dehydration: Secondary | ICD-10-CM

## 2022-04-03 DIAGNOSIS — I2510 Atherosclerotic heart disease of native coronary artery without angina pectoris: Secondary | ICD-10-CM | POA: Insufficient documentation

## 2022-04-03 DIAGNOSIS — K760 Fatty (change of) liver, not elsewhere classified: Secondary | ICD-10-CM | POA: Diagnosis not present

## 2022-04-03 DIAGNOSIS — I700 Atherosclerosis of aorta: Secondary | ICD-10-CM | POA: Diagnosis not present

## 2022-04-03 HISTORY — DX: Essential (primary) hypertension: I10

## 2022-04-03 LAB — URINALYSIS, ROUTINE W REFLEX MICROSCOPIC
Bacteria, UA: NONE SEEN
Bilirubin Urine: NEGATIVE
Glucose, UA: NEGATIVE mg/dL
Ketones, ur: NEGATIVE mg/dL
Leukocytes,Ua: NEGATIVE
Nitrite: NEGATIVE
Protein, ur: NEGATIVE mg/dL
Specific Gravity, Urine: >1.046 — ABNORMAL HIGH (ref 1.005–1.030)
pH: 6 (ref 5.0–8.0)

## 2022-04-03 LAB — COMPREHENSIVE METABOLIC PANEL
ALT: 20 U/L (ref 0–44)
AST: 21 U/L (ref 15–41)
Albumin: 4.2 g/dL (ref 3.5–5.0)
Alkaline Phosphatase: 106 U/L (ref 38–126)
Anion gap: 10 (ref 5–15)
BUN: 11 mg/dL (ref 8–23)
CO2: 26 mmol/L (ref 22–32)
Calcium: 9.3 mg/dL (ref 8.9–10.3)
Chloride: 105 mmol/L (ref 98–111)
Creatinine, Ser: 0.68 mg/dL (ref 0.44–1.00)
GFR, Estimated: >60 mL/min (ref >60)
Glucose, Bld: 113 mg/dL — ABNORMAL HIGH (ref 70–99)
Potassium: 4.2 mmol/L (ref 3.5–5.1)
Sodium: 141 mmol/L (ref 135–145)
Total Bilirubin: 0.5 mg/dL (ref 0.3–1.2)
Total Protein: 8.1 g/dL (ref 6.5–8.1)

## 2022-04-03 LAB — CBC
HCT: 49 % — ABNORMAL HIGH (ref 36.0–46.0)
Hemoglobin: 15.4 g/dL — ABNORMAL HIGH (ref 12.0–15.0)
MCH: 29.6 pg (ref 26.0–34.0)
MCHC: 31.4 g/dL (ref 30.0–36.0)
MCV: 94.2 fL (ref 80.0–100.0)
Platelets: 237 10*3/uL (ref 150–400)
RBC: 5.2 MIL/uL — ABNORMAL HIGH (ref 3.87–5.11)
RDW: 14.6 % (ref 11.5–15.5)
WBC: 8.7 10*3/uL (ref 4.0–10.5)
nRBC: 0 % (ref 0.0–0.2)

## 2022-04-03 LAB — LIPASE, BLOOD: Lipase: 39 U/L (ref 11–51)

## 2022-04-03 MED ORDER — IOHEXOL 300 MG/ML  SOLN
100.0000 mL | Freq: Once | INTRAMUSCULAR | Status: AC | PRN
  Administered 2022-04-03: 100 mL via INTRAVENOUS

## 2022-04-03 MED ORDER — LACTATED RINGERS IV BOLUS
1000.0000 mL | Freq: Once | INTRAVENOUS | Status: AC
  Administered 2022-04-03: 1000 mL via INTRAVENOUS

## 2022-04-03 NOTE — ED Provider Notes (Signed)
[place] EMERGENCY DEPARTMENT AT [facility] Provider Note   CSN: [id number] Arrival date & time: 04/03/22  1121     History  Chief Complaint  Patient presents with   Hypertension   Diarrhea    Lisa [name] is a 77 y.o. female.   Hypertension Associated symptoms include abdominal pain.  Diarrhea Associated symptoms: abdominal pain   Patient presents for abdominal pain and diarrhea.  Medical history includes CAD, COPD, GERD, HLD, diverticulitis, glaucoma.  Onset of symptoms was this morning.  She describes her diarrhea as brown and watery.  She had approximately 6 episodes of diarrhea today.  Last episode of vomiting prior to arrival.  Abdominal pain is located in the lower aspect.  It is mild at this time.  At home, she checked her blood pressure.  It was elevated in the range of 200 SBP.  Home blood pressure medications include lisinopril, metoprolol.  She did take her blood pressure medications today.  She was having mild abdominal pain at the time of checking her blood pressures.  Patient typically does not check her blood pressures every day.  She was checking them periodically before and they were always normal so she stopped checking them.     Home Medications Prior to Admission medications   Medication Sig Start Date End Date Taking? Authorizing Provider  acetaminophen (TYLENOL) 500 MG tablet Take 1,000 mg by mouth every 6 (six) hours as needed for moderate pain.   Yes [provider]  aspirin EC 81 MG EC tablet Take 1 tablet (81 mg total) by mouth daily. 09/07/16  Yes Duke, Roe Rutherford, PA  Cholecalciferol (VITAMIN D3) 125 MCG (5000 UT) CAPS Take 5,000 Units by mouth daily.   Yes [provider]  Coenzyme Q10 (COQ10) 100 MG CAPS Take 100 mg by mouth daily.   Yes [provider]  Cyanocobalamin (VITAMIN B-12 PO) Take 1 tablet by mouth daily.   Yes [provider]  diazepam (VALIUM) 2 MG tablet Take 2 mg by mouth daily as  needed for anxiety. 03/16/20  Yes [provider]  docusate sodium (COLACE) 100 MG capsule Take 100 mg by mouth daily.   Yes [provider]  fluocinonide ointment (LIDEX) 0.05 % Apply 1 Application topically 2 (two) times daily.   Yes [provider]  fluticasone-salmeterol (ADVAIR HFA) 115-21 MCG/ACT inhaler Inhale 2 puffs into the lungs at bedtime.   Yes [provider]  Glucosamine-Chondroitin (MOVE FREE PO) Take 1 tablet by mouth daily.   Yes [provider]  lisinopril (ZESTRIL) 20 MG tablet Take 20 mg by mouth daily. 03/23/20  Yes [provider]  metoprolol succinate (TOPROL XL) 25 MG 24 hr tablet Take 0.5 tablets (12.5 mg total) by mouth 2 (two) times daily. Patient taking differently: Take 25 mg by mouth daily. 04/08/19  Yes Pricilla Riffle, MD  nitroGLYCERIN (NITROSTAT) 0.4 MG SL tablet Place 1 tablet (0.4 mg total) under the tongue every 5 (five) minutes as needed for chest pain. 09/06/16  Yes Duke, Roe Rutherford, PA  omeprazole (PRILOSEC) 40 MG capsule Take 40 mg by mouth daily.   Yes [provider]  Probiotic Product (PROBIOTIC PO) Take 1 capsule by mouth daily.   Yes [provider]  VENTOLIN HFA 108 (90 Base) MCG/ACT inhaler Inhale 2 puffs into the lungs every 6 (six) hours as needed for wheezing or shortness of breath.  08/17/16  Yes [provider]      Allergies  Brilinta [ticagrelor], Penicillins, Ranexa [ranolazine], Sulfa antibiotics, and Crestor [rosuvastatin]    Review of Systems   Review of Systems  Gastrointestinal:  Positive for abdominal pain and diarrhea.  All other systems reviewed and are negative.   Physical Exam Updated Vital Signs BP (!) 167/82 (BP Location: Left Arm)   Pulse 60   Temp 98.2 F (36.8 C) (Oral)   Resp 18   Ht 5\' 6"  (1.676 m)   Wt 78 kg   LMP 09/01/2016 (LMP Unknown)   SpO2 94%   BMI 27.76 kg/m  Physical Exam Vitals and nursing note reviewed.   Constitutional:      General: She is not in acute distress.    Appearance: Normal appearance. She is well-developed. She is not ill-appearing, toxic-appearing or diaphoretic.  HENT:     Head: Normocephalic and atraumatic.     Right Ear: External ear normal.     Left Ear: External ear normal.     Nose: Nose normal.     Mouth/Throat:     Mouth: Mucous membranes are moist.  Eyes:     General: No scleral icterus.    Extraocular Movements: Extraocular movements intact.     Conjunctiva/sclera: Conjunctivae normal.  Cardiovascular:     Rate and Rhythm: Normal rate and regular rhythm.     Heart sounds: No murmur heard. Pulmonary:     Effort: Pulmonary effort is normal. No respiratory distress.     Breath sounds: Normal breath sounds. No wheezing or rales.  Chest:     Chest wall: No tenderness.  Abdominal:     Palpations: Abdomen is soft.     Tenderness: There is abdominal tenderness. There is no guarding or rebound.  Musculoskeletal:        General: No swelling. Normal range of motion.     Cervical back: Normal range of motion and neck supple.     Right lower leg: No edema.     Left lower leg: No edema.  Skin:    General: Skin is warm and dry.     Coloration: Skin is not jaundiced or pale.  Neurological:     General: No focal deficit present.     Mental Status: She is alert and oriented to person, place, and time.     Cranial Nerves: No cranial nerve deficit.     Sensory: No sensory deficit.     Motor: No weakness.     Coordination: Coordination normal.  Psychiatric:        Mood and Affect: Mood normal.        Behavior: Behavior normal.        Thought Content: Thought content normal.        Judgment: Judgment normal.     ED Results / Procedures / Treatments   Labs (all labs ordered are listed, but only abnormal results are displayed) Labs Reviewed  COMPREHENSIVE METABOLIC PANEL - Abnormal; Notable for the following components:      Result Value   Glucose, Bld 113 (*)     All other components within normal limits  CBC - Abnormal; Notable for the following components:   RBC 5.20 (*)    Hemoglobin 15.4 (*)    HCT 49.0 (*)    All other components within normal limits  URINALYSIS, ROUTINE W REFLEX MICROSCOPIC - Abnormal; Notable for the following components:   Color, Urine STRAW (*)    Specific Gravity, Urine >1.046 (*)    Hgb urine dipstick MODERATE (*)    All  other components within normal limits  LIPASE, BLOOD    EKG None  Radiology CT ABDOMEN PELVIS W CONTRAST  Result Date: 04/03/2022 CLINICAL DATA:  Abdominal pain EXAM: CT ABDOMEN AND PELVIS WITH CONTRAST TECHNIQUE: Multidetector CT imaging of the abdomen and pelvis was performed using the standard protocol following bolus administration of intravenous contrast. RADIATION DOSE REDUCTION: This exam was performed according to the departmental dose-optimization program which includes automated exposure control, adjustment of the mA and/or kV according to patient size and/or use of iterative reconstruction technique. CONTRAST:  115mL OMNIPAQUE IOHEXOL 300 MG/ML  SOLN COMPARISON:  04/01/2017 FINDINGS: Lower chest: No acute abnormality. Hepatobiliary: No solid liver abnormality is seen. Hepatic steatosis. No gallstones, gallbladder wall thickening, or biliary dilatation. Pancreas: Unremarkable. No pancreatic ductal dilatation or surrounding inflammatory changes. Spleen: Normal in size without significant abnormality. Adrenals/Urinary Tract: Adrenal glands are unremarkable. Kidneys are normal, without renal calculi, solid lesion, or hydronephrosis. Bladder is unremarkable. Stomach/Bowel: Stomach is within normal limits. Appendix appears normal. No evidence of bowel wall thickening, distention, or inflammatory changes. Descending and sigmoid diverticulosis. Vascular/Lymphatic: Aortic atherosclerosis. No enlarged abdominal or pelvic lymph nodes. Reproductive: Status post hysterectomy. Other: No abdominal wall hernia or  abnormality. No ascites. Musculoskeletal: No acute or significant osseous findings. IMPRESSION: 1. No acute CT findings of the abdomen or pelvis to explain abdominal pain. 2. Descending and sigmoid diverticulosis without evidence of acute diverticulitis. 3. Hepatic steatosis. 4. Status post hysterectomy. Aortic Atherosclerosis (ICD10-I70.0). Electronically Signed   By: Delanna Ahmadi M.D.   On: 04/03/2022 14:09    Procedures Procedures    Medications Ordered in ED Medications  lactated ringers bolus 1,000 mL (0 mLs Intravenous Stopped 04/03/22 1550)  iohexol (OMNIPAQUE) 300 MG/ML solution 100 mL (100 mLs Intravenous Contrast Given 04/03/22 1337)    ED Course/ Medical Decision Making/ A&P                             Medical Decision Making Amount and/or Complexity of Data Reviewed Labs: ordered. Radiology: ordered.  Risk Prescription drug management.   This patient presents to the ED for concern of abdominal pain and diarrhea, this involves an extensive number of treatment options, and is a complaint that carries with it a high risk of complications and morbidity.  The differential diagnosis includes gastroenteritis, colitis, diverticulitis, malabsorption,   Co morbidities that complicate the patient evaluation  CAD, COPD, GERD, HLD, diverticulitis, glaucoma   Additional history obtained:  Additional history obtained from N/A External records from outside source obtained and reviewed including EMR   Lab Tests:  I Ordered, and personally interpreted labs.  The pertinent results include: Slight decrease in hemoglobin from baseline suggestive of hemoconcentration.  No leukocytosis is present.  Lipase, hepatobiliary enzymes and electrolytes are normal.   Imaging Studies ordered:  I ordered imaging studies including CT of abdomen and pelvis I independently visualized and interpreted imaging which showed no acute findings I agree with the radiologist interpretation   Cardiac  Monitoring: / EKG:  The patient was maintained on a cardiac monitor.  I personally viewed and interpreted the cardiac monitored which showed an underlying rhythm of: Sinus rhythm   Problem List / ED Course / Critical interventions / Medication management  Patient is a pleasant 77 year old female presenting for lower abdominal pain and diarrhea starting today.  She does have history of diverticulitis.  On arrival in the ED, blood pressure is elevated in the range of 194/81.  This is similar to the range when she checked it at home.  This was an additional concern of hers.  She did take her blood pressure medications this morning.  Per chart review this includes low-dose lisinopril and metoprolol.  After being bedded in the ED, blood pressure improved to the range of 150s over 80s.  Currently, her abdominal pain is mild.  She declines any pain medication.  She does have mild tenderness on exam.  Lab work and CT imaging were ordered.  Patient was kept on bedside cardiac monitor.  CBC shows slight increase in hemoglobin from baseline suggestive of hemoconcentration.  IV fluids were ordered.  Lab work is otherwise reassuring.  CT results are also reassuring with no acute findings to explain her recent symptoms.  While in the ED, patient had improved blood pressures have ranged from 976B to 341P systolic. Urinalysis showed no evidence of infection.  It was consistent with dehydration.  She had no worsening of symptoms.  She was advised to continue hydration at home.  Currently, she is on low-dose blood pressure medications.  She was advised to continue to check her blood pressure medications at home and discuss with her primary care doctor for possible increasing of home doses.   I ordered medication including IV fluids for hydration Reevaluation of the patient after these medicines showed that the patient improved I have reviewed the patients home medicines and have made adjustments as needed   Social  Determinants of Health:  Has access to outpatient care         Final Clinical Impression(s) / ED Diagnoses Final diagnoses:  Dehydration  Hypertension, unspecified type    Rx / DC Orders ED Discharge Orders     None         Godfrey Pick, MD 04/03/22 1621

## 2022-04-03 NOTE — ED Notes (Signed)
Pt reports some mildly blurred vision and dizziness. 0730 onset.

## 2022-04-03 NOTE — ED Triage Notes (Signed)
Pt reports she cheeks her BP before she takes her meds and it was elevated.  Also reports 6 episodes of diarrhea this morning.

## 2022-04-03 NOTE — Discharge Instructions (Signed)
Your urine did not show infection.  It does show dehydration.  Continue to drink plenty of fluids at home.  Continue to check your blood pressures and discuss with your primary doctor for possible dose increases to your home medications.  Return to the emergency department for any new or worsening symptoms of concern.

## 2022-04-06 DIAGNOSIS — J449 Chronic obstructive pulmonary disease, unspecified: Secondary | ICD-10-CM | POA: Diagnosis not present

## 2022-04-06 DIAGNOSIS — I10 Essential (primary) hypertension: Secondary | ICD-10-CM | POA: Diagnosis not present

## 2022-04-06 DIAGNOSIS — I509 Heart failure, unspecified: Secondary | ICD-10-CM | POA: Diagnosis not present

## 2022-04-06 DIAGNOSIS — Z299 Encounter for prophylactic measures, unspecified: Secondary | ICD-10-CM | POA: Diagnosis not present

## 2022-07-13 DIAGNOSIS — R0602 Shortness of breath: Secondary | ICD-10-CM | POA: Diagnosis not present

## 2022-07-13 DIAGNOSIS — Z955 Presence of coronary angioplasty implant and graft: Secondary | ICD-10-CM | POA: Diagnosis not present

## 2022-07-13 DIAGNOSIS — Z888 Allergy status to other drugs, medicaments and biological substances status: Secondary | ICD-10-CM | POA: Diagnosis not present

## 2022-07-13 DIAGNOSIS — J441 Chronic obstructive pulmonary disease with (acute) exacerbation: Secondary | ICD-10-CM | POA: Diagnosis not present

## 2022-07-13 DIAGNOSIS — K5900 Constipation, unspecified: Secondary | ICD-10-CM | POA: Diagnosis not present

## 2022-07-13 DIAGNOSIS — Z20822 Contact with and (suspected) exposure to covid-19: Secondary | ICD-10-CM | POA: Diagnosis not present

## 2022-07-13 DIAGNOSIS — Z7951 Long term (current) use of inhaled steroids: Secondary | ICD-10-CM | POA: Diagnosis not present

## 2022-07-13 DIAGNOSIS — J219 Acute bronchiolitis, unspecified: Secondary | ICD-10-CM | POA: Diagnosis not present

## 2022-07-13 DIAGNOSIS — J9611 Chronic respiratory failure with hypoxia: Secondary | ICD-10-CM | POA: Diagnosis not present

## 2022-07-13 DIAGNOSIS — I700 Atherosclerosis of aorta: Secondary | ICD-10-CM | POA: Diagnosis not present

## 2022-07-13 DIAGNOSIS — Z882 Allergy status to sulfonamides status: Secondary | ICD-10-CM | POA: Diagnosis not present

## 2022-07-13 DIAGNOSIS — Z79899 Other long term (current) drug therapy: Secondary | ICD-10-CM | POA: Diagnosis not present

## 2022-07-13 DIAGNOSIS — R911 Solitary pulmonary nodule: Secondary | ICD-10-CM | POA: Diagnosis not present

## 2022-07-13 DIAGNOSIS — Z880 Allergy status to penicillin: Secondary | ICD-10-CM | POA: Diagnosis not present

## 2022-07-13 DIAGNOSIS — J45909 Unspecified asthma, uncomplicated: Secondary | ICD-10-CM | POA: Diagnosis not present

## 2022-07-13 DIAGNOSIS — J449 Chronic obstructive pulmonary disease, unspecified: Secondary | ICD-10-CM | POA: Diagnosis not present

## 2022-07-13 DIAGNOSIS — I2510 Atherosclerotic heart disease of native coronary artery without angina pectoris: Secondary | ICD-10-CM | POA: Diagnosis not present

## 2022-07-13 DIAGNOSIS — K219 Gastro-esophageal reflux disease without esophagitis: Secondary | ICD-10-CM | POA: Diagnosis not present

## 2022-07-13 DIAGNOSIS — J9601 Acute respiratory failure with hypoxia: Secondary | ICD-10-CM | POA: Diagnosis not present

## 2022-07-13 DIAGNOSIS — Z7982 Long term (current) use of aspirin: Secondary | ICD-10-CM | POA: Diagnosis not present

## 2022-07-13 DIAGNOSIS — Z1152 Encounter for screening for COVID-19: Secondary | ICD-10-CM | POA: Diagnosis not present

## 2022-07-19 DIAGNOSIS — Z299 Encounter for prophylactic measures, unspecified: Secondary | ICD-10-CM | POA: Diagnosis not present

## 2022-07-19 DIAGNOSIS — I509 Heart failure, unspecified: Secondary | ICD-10-CM | POA: Diagnosis not present

## 2022-07-19 DIAGNOSIS — J441 Chronic obstructive pulmonary disease with (acute) exacerbation: Secondary | ICD-10-CM | POA: Diagnosis not present

## 2022-07-19 DIAGNOSIS — I10 Essential (primary) hypertension: Secondary | ICD-10-CM | POA: Diagnosis not present

## 2022-07-22 ENCOUNTER — Emergency Department (HOSPITAL_COMMUNITY): Payer: 59

## 2022-07-22 ENCOUNTER — Emergency Department (HOSPITAL_COMMUNITY)
Admission: EM | Admit: 2022-07-22 | Discharge: 2022-07-22 | Disposition: A | Discharge status: Home / Self Care | Payer: 59 | Attending: Emergency Medicine | Admitting: Emergency Medicine

## 2022-07-22 ENCOUNTER — Other Ambulatory Visit: Payer: Self-pay

## 2022-07-22 ENCOUNTER — Encounter (HOSPITAL_COMMUNITY): Payer: Self-pay | Admitting: Emergency Medicine

## 2022-07-22 DIAGNOSIS — I2510 Atherosclerotic heart disease of native coronary artery without angina pectoris: Secondary | ICD-10-CM | POA: Diagnosis not present

## 2022-07-22 DIAGNOSIS — J449 Chronic obstructive pulmonary disease, unspecified: Secondary | ICD-10-CM | POA: Insufficient documentation

## 2022-07-22 DIAGNOSIS — R911 Solitary pulmonary nodule: Secondary | ICD-10-CM | POA: Insufficient documentation

## 2022-07-22 DIAGNOSIS — Z7982 Long term (current) use of aspirin: Secondary | ICD-10-CM | POA: Insufficient documentation

## 2022-07-22 DIAGNOSIS — Z7951 Long term (current) use of inhaled steroids: Secondary | ICD-10-CM | POA: Insufficient documentation

## 2022-07-22 DIAGNOSIS — R0602 Shortness of breath: Secondary | ICD-10-CM | POA: Diagnosis not present

## 2022-07-22 LAB — CBC WITH DIFFERENTIAL/PLATELET
Abs Immature Granulocytes: 0.03 10*3/uL (ref 0.00–0.07)
Basophils Absolute: 0 10*3/uL (ref 0.0–0.1)
Basophils Relative: 0 %
Eosinophils Absolute: 0.2 10*3/uL (ref 0.0–0.5)
Eosinophils Relative: 2 %
HCT: 43.2 % (ref 36.0–46.0)
Hemoglobin: 13.2 g/dL (ref 12.0–15.0)
Immature Granulocytes: 0 %
Lymphocytes Relative: 16 %
Lymphs Abs: 1.5 10*3/uL (ref 0.7–4.0)
MCH: 29.1 pg (ref 26.0–34.0)
MCHC: 30.6 g/dL (ref 30.0–36.0)
MCV: 95.4 fL (ref 80.0–100.0)
Monocytes Absolute: 1 10*3/uL (ref 0.1–1.0)
Monocytes Relative: 11 %
Neutro Abs: 7 10*3/uL (ref 1.7–7.7)
Neutrophils Relative %: 71 %
Platelets: 229 10*3/uL (ref 150–400)
RBC: 4.53 MIL/uL (ref 3.87–5.11)
RDW: 15.2 % (ref 11.5–15.5)
WBC: 9.8 10*3/uL (ref 4.0–10.5)
nRBC: 0 % (ref 0.0–0.2)

## 2022-07-22 LAB — COMPREHENSIVE METABOLIC PANEL
ALT: 16 U/L (ref 0–44)
AST: 12 U/L — ABNORMAL LOW (ref 15–41)
Albumin: 3.4 g/dL — ABNORMAL LOW (ref 3.5–5.0)
Alkaline Phosphatase: 81 U/L (ref 38–126)
Anion gap: 10 (ref 5–15)
BUN: 10 mg/dL (ref 8–23)
CO2: 29 mmol/L (ref 22–32)
Calcium: 8.7 mg/dL — ABNORMAL LOW (ref 8.9–10.3)
Chloride: 101 mmol/L (ref 98–111)
Creatinine, Ser: 0.65 mg/dL (ref 0.44–1.00)
GFR, Estimated: >60 mL/min (ref >60)
Glucose, Bld: 97 mg/dL (ref 70–99)
Potassium: 3.6 mmol/L (ref 3.5–5.1)
Sodium: 140 mmol/L (ref 135–145)
Total Bilirubin: 0.9 mg/dL (ref 0.3–1.2)
Total Protein: 7.1 g/dL (ref 6.5–8.1)

## 2022-07-22 LAB — TROPONIN I (HIGH SENSITIVITY)
Troponin I (High Sensitivity): 5 ng/L (ref <18)
Troponin I (High Sensitivity): 5 ng/L (ref <18)

## 2022-07-22 LAB — BRAIN NATRIURETIC PEPTIDE: B Natriuretic Peptide: 186 pg/mL — ABNORMAL HIGH (ref 0.0–100.0)

## 2022-07-22 MED ORDER — ALBUTEROL SULFATE HFA 108 (90 BASE) MCG/ACT IN AERS: 2.0000 | INHALATION_SPRAY | RESPIRATORY_TRACT | Status: DC | PRN

## 2022-07-22 MED ORDER — IOHEXOL 350 MG/ML SOLN
75.0000 mL | Freq: Once | INTRAVENOUS | Status: AC | PRN
  Administered 2022-07-22: 75 mL via INTRAVENOUS

## 2022-07-22 NOTE — ED Provider Notes (Signed)
[name] EMERGENCY DEPARTMENT AT [facility] Provider Note   CSN: [id number] Arrival date & time: 07/22/22  1330     History  Chief Complaint  Patient presents with   Shortness of Breath    Lisa [name] is a 77 y.o. female history of NSTEMI, CAD, unstable angina, COPD, presented for shortness of breath for the past 3 weeks but worsened this morning.  Patient was recently discharged on 07/13/2022 due to COPD exacerbation and bronchitis diagnosis.  Patient states since hospital stay she was placed on 3 L baseline however stated that today when she woke up her home oxygen was 71% with her oxygen.  Patient states that she has had increased shortness of breath since being discharged.    Home Medications Prior to Admission medications   Medication Sig Start Date End Date Taking? Authorizing Provider  acetaminophen (TYLENOL) 500 MG tablet Take 1,000 mg by mouth every 6 (six) hours as needed for moderate pain.   Yes [provider]  aspirin EC 81 MG EC tablet Take 1 tablet (81 mg total) by mouth daily. 09/07/16  Yes Duke, Roe Rutherford, PA  Cholecalciferol (VITAMIN D3) 125 MCG (5000 UT) CAPS Take 5,000 Units by mouth daily.   Yes [provider]  Coenzyme Q10 (COQ10) 100 MG CAPS Take 100 mg by mouth daily.   Yes [provider]  Cyanocobalamin (VITAMIN B-12 PO) Take 1 tablet by mouth daily.   Yes [provider]  diazepam (VALIUM) 2 MG tablet Take 2 mg by mouth daily as needed for anxiety. 03/16/20  Yes [provider]  docusate sodium (COLACE) 100 MG capsule Take 100 mg by mouth daily.   Yes [provider]  fluticasone-salmeterol (ADVAIR HFA) 115-21 MCG/ACT inhaler Inhale 2 puffs into the lungs at bedtime.   Yes [provider]  Glucosamine-Chondroitin (MOVE FREE PO) Take 1 tablet by mouth daily.   Yes [provider]  ipratropium-albuterol (DUONEB) 0.5-2.5 (3) MG/3ML SOLN Take 3 mLs by nebulization every 6  (six) hours as needed (shortness of breath). 07/19/22  Yes [provider]  lisinopril (ZESTRIL) 20 MG tablet Take 20 mg by mouth daily. 03/23/20  Yes [provider]  metoprolol succinate (TOPROL XL) 25 MG 24 hr tablet Take 0.5 tablets (12.5 mg total) by mouth 2 (two) times daily. Patient taking differently: Take 25 mg by mouth daily. 04/08/19  Yes Pricilla Riffle, MD  nitroGLYCERIN (NITROSTAT) 0.4 MG SL tablet Place 1 tablet (0.4 mg total) under the tongue every 5 (five) minutes as needed for chest pain. 09/06/16  Yes Duke, Roe Rutherford, PA  omeprazole (PRILOSEC) 40 MG capsule Take 40 mg by mouth daily.   Yes [provider]  Probiotic Product (PROBIOTIC PO) Take 1 capsule by mouth daily.   Yes [provider]  VENTOLIN HFA 108 (90 Base) MCG/ACT inhaler Inhale 2 puffs into the lungs every 6 (six) hours as needed for wheezing or shortness of breath.  08/17/16  Yes [provider]  fluocinonide ointment (LIDEX) 0.05 % Apply 1 Application topically 2 (two) times daily. Patient not taking: Reported on 07/22/2022    [provider]      Allergies    Brilinta [ticagrelor], Penicillins, Ranexa [ranolazine], Sulfa antibiotics, and Crestor [rosuvastatin]    Review of Systems   Review of Systems  Respiratory:  Positive for shortness of breath.     Physical Exam Updated Vital Signs BP 136/66   Pulse 65   Temp 97.8 F (36.6  C) (Oral)   Resp 19   Ht 5\' 6"  (1.676 m)   Wt 78.9 kg   LMP 09/01/2016 (LMP Unknown)   SpO2 95%   BMI 28.08 kg/m  Physical Exam Constitutional:      General: She is not in acute distress. HENT:     Head: Normocephalic.  Cardiovascular:     Rate and Rhythm: Normal rate and regular rhythm.     Pulses: Normal pulses.     Heart sounds: Normal heart sounds.  Pulmonary:     Breath sounds: Wheezing present.  Abdominal:     Palpations: Abdomen is soft.     Tenderness: There is no abdominal tenderness. There is no guarding  or rebound.  Musculoskeletal:        General: Normal range of motion.     Cervical back: Normal range of motion.  Skin:    General: Skin is warm and dry.     Capillary Refill: Capillary refill takes less than 2 seconds.  Neurological:     Mental Status: She is alert.  Psychiatric:        Mood and Affect: Mood normal.     ED Results / Procedures / Treatments   Labs (all labs ordered are listed, but only abnormal results are displayed) Labs Reviewed  COMPREHENSIVE METABOLIC PANEL - Abnormal; Notable for the following components:      Result Value   Calcium 8.7 (*)    Albumin 3.4 (*)    AST 12 (*)    All other components within normal limits  BRAIN NATRIURETIC PEPTIDE - Abnormal; Notable for the following components:   B Natriuretic Peptide 186.0 (*)    All other components within normal limits  CBC WITH DIFFERENTIAL/PLATELET  TROPONIN I (HIGH SENSITIVITY)  TROPONIN I (HIGH SENSITIVITY)    EKG EKG Interpretation  Date/Time:  Friday Jul 22 2022 17:01:55 EDT Ventricular Rate:  66 PR Interval:  182 QRS Duration: 85 QT Interval:  408 QTC Calculation: 428 R Axis:   37 Text Interpretation: Sinus or ectopic atrial rhythm Anteroseptal infarct, age indeterminate Confirmed by Vonita Moss [phone]) on 07/22/2022 6:43:27 PM  Radiology CT Angio Chest PE W/Cm &/Or Wo Cm  Result Date: 07/22/2022 CLINICAL DATA:  Shortness of breath EXAM: CT ANGIOGRAPHY CHEST WITH CONTRAST TECHNIQUE: Multidetector CT imaging of the chest was performed using the standard protocol during bolus administration of intravenous contrast. Multiplanar CT image reconstructions and MIPs were obtained to evaluate the vascular anatomy. RADIATION DOSE REDUCTION: This exam was performed according to the departmental dose-optimization program which includes automated exposure control, adjustment of the mA and/or kV according to patient size and/or use of iterative reconstruction technique. CONTRAST:  75mL OMNIPAQUE  IOHEXOL 350 MG/ML SOLN COMPARISON:  Chest x-ray 07/22/2022, CT chest 07/13/2022 FINDINGS: Cardiovascular: Satisfactory opacification of the pulmonary arteries to the segmental level. No evidence of pulmonary embolism. Normal heart size. No pericardial effusion. Nonaneurysmal aorta. No dissection. Moderate aortic atherosclerosis. Dilated pulmonary trunk measuring up to 3.9 cm. Mild coronary vascular calcification Mediastinum/Nodes: No enlarged mediastinal, hilar, or axillary lymph nodes. Thyroid gland, trachea, and esophagus demonstrate no significant findings. Lungs/Pleura: Emphysema. Stable 5 mm right middle lobe pulmonary nodule. Similar mild tree-in-bud density within the right upper and middle lobes. Dependent atelectasis. Slight increased partial consolidation left lung base. No pleural effusion or pneumothorax Upper Abdomen: No acute abnormality. Musculoskeletal: No chest wall abnormality. No acute or significant osseous findings. Review of the MIP images confirms the above findings. IMPRESSION: 1. Negative for acute pulmonary  embolus or aortic dissection. 2. Emphysema. Slight increased partial consolidation at the left lung base, worsened atelectasis versus developing pneumonia. Similar mild tree-in-bud density within the right upper and middle lobes, consistent with infectious or inflammatory bronchiolitis. 3. Dilated pulmonary trunk suggesting pulmonary arterial hypertension. 4. Aortic atherosclerosis. 5. Emphysema. Stable 5-6 mm right middle lobe pulmonary nodule, reference chest CT report 07/13/2022 for additional recommendations. Aortic Atherosclerosis (ICD10-I70.0) and Emphysema (ICD10-J43.9). Electronically Signed   By: Jasmine Pang M.D.   On: 07/22/2022 17:55   DG Chest 2 View  Result Date: 07/22/2022 CLINICAL DATA:  Shortness of breath.  Decreased oxygen saturation. EXAM: CHEST - 2 VIEW COMPARISON:  07/13/2022 FINDINGS: Mild cardiac enlargement is stable. Chronic increase interstitial markings  are noted bilaterally. Atelectasis versus airspace disease identified within the right middle lobe and right lower lobe. No acute osseous findings. IMPRESSION: 1. Atelectasis versus airspace disease within the right middle lobe and right lower lobe. 2. Chronic increase interstitial markings secondary to COPD/emphysema. Electronically Signed   By: Signa Kell M.D.   On: 07/22/2022 15:23    Procedures Procedures    Medications Ordered in ED Medications  albuterol (VENTOLIN HFA) 108 (90 Base) MCG/ACT inhaler 2 puff (has no administration in time range)  iohexol (OMNIPAQUE) 350 MG/ML injection 75 mL (75 mLs Intravenous Contrast Given 07/22/22 1741)    ED Course/ Medical Decision Making/ A&P                             Medical Decision Making Amount and/or Complexity of Data Reviewed Labs: ordered. Radiology: ordered.  Risk Prescription drug management.   Lisa [name] 77 y.o. presented today for shortness of breath.  Working DDx that I considered at this time includes, but not limited to, asthma/COPD exacerbation, URI, viral illness, anemia, ACS, PE, pneumonia, pleural effusion, lung cancer.  R/o DDx: Asthma exacerbation, COPD exacerbation, URI, viral illness, anemia, ACS, PE, pneumonia, pleural effusion, lung cancer: These are considered less likely due to history of present illness and physical exam findings  Review of prior external notes: 04/03/2022 ED provider  Unique Tests and My Interpretation:  CBC: Unremarkable CMP: Unremarkable BNP: Slightly elevated 186 EKG: 6 6 bpm with possible ectopic, no ST abnormalities or blocks noted Troponin: 5, 5 CXR: Atelectasis versus pneumonia and right middle/lower lobe CTA Chest PE: Emphysema with atelectasis versus pneumonia present, pulmonary arterial hypertension noted, stable 5 to 6 mm right middle pulmonary nodule  Discussion with Independent Historian:  Daughter  Discussion of Management of Tests: None  Risk: Low: based on  diagnostic testing/clinical impression and treatment plan  Risk Stratification Score:  None  Staffed with Eloise Harman, MD  Plan: Patient presented for shortness of breath.  On exam patient was in no acute distress and had stable vitals.  Patient was on 3 L of oxygen via nasal cannula.  Patient's physical exam outside of the 3 L were unremarkable and I did not note any leg swelling.  Patient states she was discharged on 2.5 L however trended up to 3 L on her own.  Due to patient having recent hospitalization and being discharged on oxygen along with her age a CTA chest was ordered to evaluate for possible PE along with labs and imaging.  CTA showed emphysema due to patient's history of COPD along with pulmonary arterial hypertension most likely secondary to patient's COPD.  Patient does have stable right middle lobe nodule seen on previous imaging.  Patient also had  atelectasis versus pneumonia present on CTA however patient does not endorse any infectious symptoms, has stable vitals, and does not have a white count and so this is most likely atelectasis at this point.  I spoke to the patient about how her oxygen should be around 88 to 92% due to her COPD history. Patient remained stable throughout ED stay and will be discharged with primary care follow-up.  Patient was given return precautions. Patient stable for discharge at this time.  Patient verbalized understanding of plan.         Final Clinical Impression(s) / ED Diagnoses Final diagnoses:  Lung nodule  Shortness of breath    Rx / DC Orders ED Discharge Orders     None         Remi Deter 07/22/22 1908    Rondel Baton, MD 07/27/22 [phone]

## 2022-07-22 NOTE — Discharge Instructions (Signed)
Please follow-up with your primary care provider regarding recent symptoms and ER visit.  Today your CT showed atelectasis most likely secondary to your bronchitis or COPD.  Please use your oxygen as prescribed.  Due to you having COPD your oxygen should be around 88 to 92%.  If symptoms worsen please return to ER.

## 2022-07-22 NOTE — ED Provider Triage Note (Signed)
Emergency Medicine Provider Triage Evaluation Note  Lisa [name] , a 77 y.o. female  was evaluated in triage.  Pt complains of shortness of breath for the past 3 weeks.  History is provided by daughter.  Patient recent hospitalization for COPD exacerbation.  On 3 L oxygen at baseline.  Daughter states patient's legs have become more swollen bilaterally.  Patient does not have history of cancer, blood clots, estrogen use, recent travel.  Review of Systems  Positive: See HPI Negative: See HPI  Physical Exam  BP (!) 170/92   Pulse 70   Temp 97.8 F (36.6 C) (Oral)   Resp 20   Ht 5\' 6"  (1.676 m)   Wt 78.9 kg   LMP 09/01/2016 (LMP Unknown)   SpO2 90%   BMI 28.08 kg/m  Gen:   Awake, no distress   Resp:  Normal effort  MSK:   Moves extremities without difficulty  Other:    Medical Decision Making  Medically screening exam initiated at 2:50 PM.  Appropriate orders placed.  Lisa [name] was informed that the remainder of the evaluation will be completed by another provider, this initial triage assessment does not replace that evaluation, and the importance of remaining in the ED until their evaluation is complete.  Workup initiated, PE study ordered as patient is 77 years old with recent hospitalization   Lisa [name] 07/22/22 1459

## 2022-07-22 NOTE — ED Triage Notes (Addendum)
Pt via POV c/o SOB since this morning. Pt wears 3L O2 at baseline, new x 3 weeks after bronchitis dx, but saturation at home read as low as 71% today with increased SOB. O2 sat 84% on room air in triage. Pt denies CP, dizziness, cough.

## 2022-08-05 DIAGNOSIS — I509 Heart failure, unspecified: Secondary | ICD-10-CM | POA: Diagnosis not present

## 2022-08-05 DIAGNOSIS — J209 Acute bronchitis, unspecified: Secondary | ICD-10-CM | POA: Diagnosis not present

## 2022-08-05 DIAGNOSIS — J440 Chronic obstructive pulmonary disease with acute lower respiratory infection: Secondary | ICD-10-CM | POA: Diagnosis not present

## 2022-08-05 DIAGNOSIS — Z0000 Encounter for general adult medical examination without abnormal findings: Secondary | ICD-10-CM | POA: Diagnosis not present

## 2022-08-05 DIAGNOSIS — I10 Essential (primary) hypertension: Secondary | ICD-10-CM | POA: Diagnosis not present

## 2022-08-05 DIAGNOSIS — Z299 Encounter for prophylactic measures, unspecified: Secondary | ICD-10-CM | POA: Diagnosis not present

## 2022-08-05 DIAGNOSIS — Z7189 Other specified counseling: Secondary | ICD-10-CM | POA: Diagnosis not present

## 2022-08-19 DIAGNOSIS — T63441A Toxic effect of venom of bees, accidental (unintentional), initial encounter: Secondary | ICD-10-CM | POA: Diagnosis not present

## 2022-08-19 DIAGNOSIS — I10 Essential (primary) hypertension: Secondary | ICD-10-CM | POA: Diagnosis not present

## 2022-08-19 DIAGNOSIS — Z299 Encounter for prophylactic measures, unspecified: Secondary | ICD-10-CM | POA: Diagnosis not present

## 2022-09-09 DIAGNOSIS — I10 Essential (primary) hypertension: Secondary | ICD-10-CM | POA: Diagnosis not present

## 2022-09-09 DIAGNOSIS — Z299 Encounter for prophylactic measures, unspecified: Secondary | ICD-10-CM | POA: Diagnosis not present

## 2022-09-09 DIAGNOSIS — J449 Chronic obstructive pulmonary disease, unspecified: Secondary | ICD-10-CM | POA: Diagnosis not present

## 2022-09-09 DIAGNOSIS — K5732 Diverticulitis of large intestine without perforation or abscess without bleeding: Secondary | ICD-10-CM | POA: Diagnosis not present

## 2022-11-23 DIAGNOSIS — I10 Essential (primary) hypertension: Secondary | ICD-10-CM | POA: Diagnosis not present

## 2022-11-23 DIAGNOSIS — I509 Heart failure, unspecified: Secondary | ICD-10-CM | POA: Diagnosis not present

## 2022-11-23 DIAGNOSIS — Z299 Encounter for prophylactic measures, unspecified: Secondary | ICD-10-CM | POA: Diagnosis not present

## 2022-11-23 DIAGNOSIS — Z23 Encounter for immunization: Secondary | ICD-10-CM | POA: Diagnosis not present

## 2022-11-23 DIAGNOSIS — Z0000 Encounter for general adult medical examination without abnormal findings: Secondary | ICD-10-CM | POA: Diagnosis not present

## 2022-11-23 DIAGNOSIS — J449 Chronic obstructive pulmonary disease, unspecified: Secondary | ICD-10-CM | POA: Diagnosis not present

## 2022-12-28 ENCOUNTER — Encounter (HOSPITAL_COMMUNITY): Payer: Self-pay

## 2022-12-28 ENCOUNTER — Inpatient Hospital Stay (HOSPITAL_COMMUNITY)
Admission: AD | Admit: 2022-12-28 | Discharge: 2023-01-02 | DRG: 698 | Disposition: A | Discharge status: Home Health | Payer: Medicare Other | Source: Other Acute Inpatient Hospital | Attending: Internal Medicine | Admitting: Internal Medicine

## 2022-12-28 DIAGNOSIS — Z9842 Cataract extraction status, left eye: Secondary | ICD-10-CM

## 2022-12-28 DIAGNOSIS — K219 Gastro-esophageal reflux disease without esophagitis: Secondary | ICD-10-CM | POA: Diagnosis present

## 2022-12-28 DIAGNOSIS — J439 Emphysema, unspecified: Secondary | ICD-10-CM | POA: Diagnosis not present

## 2022-12-28 DIAGNOSIS — I5033 Acute on chronic diastolic (congestive) heart failure: Secondary | ICD-10-CM | POA: Diagnosis not present

## 2022-12-28 DIAGNOSIS — Z961 Presence of intraocular lens: Secondary | ICD-10-CM | POA: Diagnosis present

## 2022-12-28 DIAGNOSIS — J9601 Acute respiratory failure with hypoxia: Secondary | ICD-10-CM | POA: Diagnosis not present

## 2022-12-28 DIAGNOSIS — N280 Ischemia and infarction of kidney: Secondary | ICD-10-CM | POA: Diagnosis not present

## 2022-12-28 DIAGNOSIS — Z955 Presence of coronary angioplasty implant and graft: Secondary | ICD-10-CM | POA: Diagnosis not present

## 2022-12-28 DIAGNOSIS — I2510 Atherosclerotic heart disease of native coronary artery without angina pectoris: Secondary | ICD-10-CM | POA: Diagnosis not present

## 2022-12-28 DIAGNOSIS — H35033 Hypertensive retinopathy, bilateral: Secondary | ICD-10-CM | POA: Diagnosis not present

## 2022-12-28 DIAGNOSIS — Z8042 Family history of malignant neoplasm of prostate: Secondary | ICD-10-CM

## 2022-12-28 DIAGNOSIS — R54 Age-related physical debility: Secondary | ICD-10-CM | POA: Diagnosis present

## 2022-12-28 DIAGNOSIS — I739 Peripheral vascular disease, unspecified: Secondary | ICD-10-CM | POA: Diagnosis present

## 2022-12-28 DIAGNOSIS — E876 Hypokalemia: Secondary | ICD-10-CM | POA: Diagnosis not present

## 2022-12-28 DIAGNOSIS — R918 Other nonspecific abnormal finding of lung field: Secondary | ICD-10-CM | POA: Diagnosis not present

## 2022-12-28 DIAGNOSIS — R109 Unspecified abdominal pain: Secondary | ICD-10-CM | POA: Diagnosis not present

## 2022-12-28 DIAGNOSIS — K5730 Diverticulosis of large intestine without perforation or abscess without bleeding: Secondary | ICD-10-CM | POA: Diagnosis not present

## 2022-12-28 DIAGNOSIS — J9811 Atelectasis: Secondary | ICD-10-CM | POA: Diagnosis present

## 2022-12-28 DIAGNOSIS — I252 Old myocardial infarction: Secondary | ICD-10-CM

## 2022-12-28 DIAGNOSIS — J449 Chronic obstructive pulmonary disease, unspecified: Secondary | ICD-10-CM | POA: Diagnosis present

## 2022-12-28 DIAGNOSIS — Z8719 Personal history of other diseases of the digestive system: Secondary | ICD-10-CM | POA: Diagnosis not present

## 2022-12-28 DIAGNOSIS — J4489 Other specified chronic obstructive pulmonary disease: Secondary | ICD-10-CM | POA: Diagnosis not present

## 2022-12-28 DIAGNOSIS — Z79899 Other long term (current) drug therapy: Secondary | ICD-10-CM

## 2022-12-28 DIAGNOSIS — I10 Essential (primary) hypertension: Secondary | ICD-10-CM

## 2022-12-28 DIAGNOSIS — J984 Other disorders of lung: Secondary | ICD-10-CM | POA: Diagnosis not present

## 2022-12-28 DIAGNOSIS — Z7982 Long term (current) use of aspirin: Secondary | ICD-10-CM | POA: Diagnosis not present

## 2022-12-28 DIAGNOSIS — R6889 Other general symptoms and signs: Secondary | ICD-10-CM | POA: Diagnosis not present

## 2022-12-28 DIAGNOSIS — Z9861 Coronary angioplasty status: Secondary | ICD-10-CM

## 2022-12-28 DIAGNOSIS — K5792 Diverticulitis of intestine, part unspecified, without perforation or abscess without bleeding: Secondary | ICD-10-CM | POA: Diagnosis not present

## 2022-12-28 DIAGNOSIS — Z880 Allergy status to penicillin: Secondary | ICD-10-CM

## 2022-12-28 DIAGNOSIS — R7401 Elevation of levels of liver transaminase levels: Secondary | ICD-10-CM | POA: Diagnosis not present

## 2022-12-28 DIAGNOSIS — R1012 Left upper quadrant pain: Secondary | ICD-10-CM | POA: Diagnosis not present

## 2022-12-28 DIAGNOSIS — Z6831 Body mass index (BMI) 31.0-31.9, adult: Secondary | ICD-10-CM | POA: Diagnosis not present

## 2022-12-28 DIAGNOSIS — Z8619 Personal history of other infectious and parasitic diseases: Secondary | ICD-10-CM

## 2022-12-28 DIAGNOSIS — Z882 Allergy status to sulfonamides status: Secondary | ICD-10-CM | POA: Diagnosis not present

## 2022-12-28 DIAGNOSIS — Z743 Need for continuous supervision: Secondary | ICD-10-CM | POA: Diagnosis not present

## 2022-12-28 DIAGNOSIS — Z888 Allergy status to other drugs, medicaments and biological substances status: Secondary | ICD-10-CM | POA: Diagnosis not present

## 2022-12-28 DIAGNOSIS — J8410 Pulmonary fibrosis, unspecified: Secondary | ICD-10-CM | POA: Diagnosis not present

## 2022-12-28 DIAGNOSIS — Z8249 Family history of ischemic heart disease and other diseases of the circulatory system: Secondary | ICD-10-CM

## 2022-12-28 DIAGNOSIS — I823 Embolism and thrombosis of renal vein: Secondary | ICD-10-CM | POA: Diagnosis not present

## 2022-12-28 DIAGNOSIS — B0052 Herpesviral keratitis: Secondary | ICD-10-CM | POA: Diagnosis present

## 2022-12-28 DIAGNOSIS — I4891 Unspecified atrial fibrillation: Secondary | ICD-10-CM | POA: Diagnosis not present

## 2022-12-28 DIAGNOSIS — Z9841 Cataract extraction status, right eye: Secondary | ICD-10-CM

## 2022-12-28 DIAGNOSIS — I110 Hypertensive heart disease with heart failure: Secondary | ICD-10-CM | POA: Diagnosis present

## 2022-12-28 DIAGNOSIS — B0230 Zoster ocular disease, unspecified: Secondary | ICD-10-CM | POA: Diagnosis not present

## 2022-12-28 DIAGNOSIS — F17210 Nicotine dependence, cigarettes, uncomplicated: Secondary | ICD-10-CM | POA: Diagnosis not present

## 2022-12-28 DIAGNOSIS — Z720 Tobacco use: Secondary | ICD-10-CM | POA: Diagnosis present

## 2022-12-28 DIAGNOSIS — R040 Epistaxis: Secondary | ICD-10-CM | POA: Diagnosis not present

## 2022-12-28 DIAGNOSIS — R0602 Shortness of breath: Secondary | ICD-10-CM | POA: Diagnosis not present

## 2022-12-28 DIAGNOSIS — E66811 Obesity, class 1: Secondary | ICD-10-CM | POA: Diagnosis present

## 2022-12-28 DIAGNOSIS — Z90710 Acquired absence of both cervix and uterus: Secondary | ICD-10-CM

## 2022-12-28 DIAGNOSIS — R9431 Abnormal electrocardiogram [ECG] [EKG]: Secondary | ICD-10-CM | POA: Diagnosis not present

## 2022-12-28 DIAGNOSIS — R935 Abnormal findings on diagnostic imaging of other abdominal regions, including retroperitoneum: Secondary | ICD-10-CM | POA: Diagnosis not present

## 2022-12-28 DIAGNOSIS — Z7951 Long term (current) use of inhaled steroids: Secondary | ICD-10-CM

## 2022-12-28 DIAGNOSIS — N179 Acute kidney failure, unspecified: Secondary | ICD-10-CM | POA: Diagnosis not present

## 2022-12-28 DIAGNOSIS — Z825 Family history of asthma and other chronic lower respiratory diseases: Secondary | ICD-10-CM

## 2022-12-28 LAB — HEPARIN LEVEL (UNFRACTIONATED): Heparin Unfractionated: 0.34 [IU]/mL (ref 0.30–0.70)

## 2022-12-28 MED ORDER — ONDANSETRON HCL 4 MG PO TABS: 4.0000 mg | ORAL_TABLET | Freq: Four times a day (QID) | ORAL | Status: DC | PRN

## 2022-12-28 MED ORDER — SORBITOL 70 % SOLN
30.0000 mL | Freq: Every day | Status: DC | PRN
  Administered 2023-01-01: 30 mL via ORAL
  Filled 2022-12-28 (×2): qty 30

## 2022-12-28 MED ORDER — OXYCODONE HCL 5 MG PO TABS
5.0000 mg | ORAL_TABLET | ORAL | Status: DC | PRN
  Administered 2022-12-28 – 2022-12-30 (×6): 5 mg via ORAL
  Filled 2022-12-28 (×6): qty 1

## 2022-12-28 MED ORDER — IPRATROPIUM-ALBUTEROL 0.5-2.5 (3) MG/3ML IN SOLN
3.0000 mL | Freq: Four times a day (QID) | RESPIRATORY_TRACT | Status: DC
  Administered 2022-12-28 – 2022-12-29 (×2): 3 mL via RESPIRATORY_TRACT
  Filled 2022-12-28 (×2): qty 3

## 2022-12-28 MED ORDER — DOCUSATE SODIUM 100 MG PO CAPS
100.0000 mg | ORAL_CAPSULE | Freq: Every day | ORAL | Status: DC
  Administered 2022-12-28 – 2023-01-01 (×5): 100 mg via ORAL
  Filled 2022-12-28 (×5): qty 1

## 2022-12-28 MED ORDER — HYDROMORPHONE HCL 1 MG/ML IJ SOLN
0.5000 mg | INTRAMUSCULAR | Status: DC | PRN
  Administered 2022-12-28 – 2022-12-29 (×2): 0.5 mg via INTRAVENOUS
  Filled 2022-12-28 (×4): qty 0.5

## 2022-12-28 MED ORDER — ACETAMINOPHEN 650 MG RE SUPP: 650.0000 mg | Freq: Four times a day (QID) | RECTAL | Status: DC | PRN

## 2022-12-28 MED ORDER — PANTOPRAZOLE SODIUM 40 MG PO TBEC
40.0000 mg | DELAYED_RELEASE_TABLET | Freq: Every day | ORAL | Status: DC
  Administered 2022-12-29 – 2023-01-02 (×5): 40 mg via ORAL
  Filled 2022-12-28 (×6): qty 1

## 2022-12-28 MED ORDER — ONDANSETRON HCL 4 MG/2ML IJ SOLN: 4.0000 mg | Freq: Four times a day (QID) | INTRAMUSCULAR | Status: DC | PRN

## 2022-12-28 MED ORDER — METOPROLOL SUCCINATE ER 25 MG PO TB24
25.0000 mg | ORAL_TABLET | Freq: Every day | ORAL | Status: DC
  Administered 2022-12-30: 25 mg via ORAL
  Filled 2022-12-28 (×3): qty 1

## 2022-12-28 MED ORDER — ACETAMINOPHEN 500 MG PO TABS
500.0000 mg | ORAL_TABLET | Freq: Four times a day (QID) | ORAL | Status: DC | PRN
  Administered 2022-12-30 – 2023-01-02 (×3): 500 mg via ORAL
  Filled 2022-12-28 (×3): qty 1

## 2022-12-28 MED ORDER — ASPIRIN 81 MG PO TBEC
81.0000 mg | DELAYED_RELEASE_TABLET | Freq: Every day | ORAL | Status: DC
  Administered 2022-12-28 – 2022-12-31 (×4): 81 mg via ORAL
  Filled 2022-12-28 (×4): qty 1

## 2022-12-28 MED ORDER — OXYCODONE HCL 5 MG PO TABS: 5.0000 mg | ORAL_TABLET | ORAL | Status: DC | PRN

## 2022-12-28 MED ORDER — HEPARIN (PORCINE) 25000 UT/250ML-% IV SOLN
1500.0000 [IU]/h | INTRAVENOUS | Status: DC
  Administered 2022-12-28 – 2022-12-29 (×2): 1100 [IU]/h via INTRAVENOUS
  Administered 2022-12-30: 1250 [IU]/h via INTRAVENOUS
  Filled 2022-12-28 (×3): qty 250

## 2022-12-28 NOTE — Consult Note (Signed)
Hospital Consult    Reason for Consult:  Left renal infarct  Referring Physician:  Lanier Prude ED MRN #:  [id number]  History of Present Illness: This is a 77 y.o. female with hx COPD, HTN, tobacco abuse, and CAD that vascular surgery was consulted for left renal infarct.  Patient presented to [facility] with left abdominal pain that started about 4 AM.  She states she has never had pain like this before.  Got a CT at [place] showing 50% infarction of the left kidney.  Past Medical History:  Diagnosis Date   Asthma    COPD (chronic obstructive pulmonary disease) (HCC)    Coronary artery disease    Cough    " ALL MY LIFE "   Dyspnea    GERD (gastroesophageal reflux disease)    Hypertension    NSTEMI (non-ST elevated myocardial infarction) (HCC) 09/01/2016   DES to Cx/OM bifurcation   Unstable angina (HCC) 08/2016    Past Surgical History:  Procedure Laterality Date   ABDOMINAL HYSTERECTOMY     CORONARY STENT INTERVENTION  09/05/2016   Successful complex PCI of the circumflex/OM bifurcation using a Synergy DES   CORONARY STENT INTERVENTION N/A 09/05/2016   Procedure: Coronary Stent Intervention;  Surgeon: Tonny Bollman, MD;  Location: [facility] INVASIVE CV LAB;  Service: Cardiovascular;  Laterality: N/A;   LEFT HEART CATH AND CORONARY ANGIOGRAPHY N/A 09/02/2016   Procedure: Left Heart Cath and Coronary Angiography;  Surgeon: Corky Crafts, MD;  Location: [facility] INVASIVE CV LAB;  Service: Cardiovascular;  Laterality: N/A;    Allergies  Allergen Reactions   Brilinta [Ticagrelor] Shortness Of Breath   Penicillins Anaphylaxis    Has patient had a PCN reaction causing immediate rash, facial/tongue/throat swelling, SOB or lightheadedness with hypotension: Yes Has patient had a PCN reaction causing severe rash involving mucus membranes or skin necrosis: yes Has patient had a PCN reaction that required hospitalization: no Has patient had a PCN reaction occurring within the  last 10 years: no If all of the above answers are "NO", then may proceed with Cephalosporin use.    Ranexa [Ranolazine] Shortness Of Breath   Sulfa Antibiotics Anaphylaxis and Rash    Blistering rash on hands and feet   Crestor [Rosuvastatin] Other (See Comments)    Myalgias    Prior to Admission medications   Medication Sig Start Date End Date Taking? Authorizing Provider  acetaminophen (TYLENOL) 500 MG tablet Take 1,000 mg by mouth every 6 (six) hours as needed for moderate pain.   Yes [provider]  aspirin EC 81 MG EC tablet Take 1 tablet (81 mg total) by mouth daily. 09/07/16  Yes Duke, Roe Rutherford, PA  Cholecalciferol (VITAMIN D3) 125 MCG (5000 UT) CAPS Take 5,000 Units by mouth daily.   Yes [provider]  Coenzyme Q10 (COQ10) 100 MG CAPS Take 100 mg by mouth daily.   Yes [provider]  Cyanocobalamin (VITAMIN B-12 PO) Take 1 tablet by mouth daily.   Yes [provider]  docusate sodium (COLACE) 100 MG capsule Take 100 mg by mouth daily.   Yes [provider]  esomeprazole (NEXIUM) 20 MG capsule Take 20 mg by mouth daily at 12 noon.   Yes [provider]  fluocinonide ointment (LIDEX) 0.05 % Apply 1 Application topically 2 (two) times daily.   Yes [provider]  fluticasone-salmeterol (ADVAIR HFA) 115-21 MCG/ACT inhaler Inhale 2 puffs into the lungs at bedtime.   Yes [provider]  furosemide (LASIX) 20 MG tablet Take 20 mg by mouth daily as needed. 12/14/22  Yes [provider]  Glucosamine-Chondroitin (MOVE FREE PO) Take 1 tablet by mouth daily.   Yes [provider]  lisinopril (ZESTRIL) 20 MG tablet Take 20 mg by mouth daily. 03/23/20  Yes [provider]  metoprolol succinate (TOPROL XL) 25 MG 24 hr tablet Take 0.5 tablets (12.5 mg total) by mouth 2 (two) times daily. Patient taking differently: Take 25 mg by mouth daily. 04/08/19  Yes Pricilla Riffle, MD  nitroGLYCERIN  (NITROSTAT) 0.4 MG SL tablet Place 1 tablet (0.4 mg total) under the tongue every 5 (five) minutes as needed for chest pain. 09/06/16  Yes Duke, Roe Rutherford, PA  potassium chloride (KLOR-CON M) 10 MEQ tablet Take 10 mEq by mouth as needed. 12/14/22  Yes [provider]  Probiotic Product (PROBIOTIC PO) Take 1 capsule by mouth daily.   Yes [provider]  diazepam (VALIUM) 2 MG tablet Take 2 mg by mouth daily as needed for anxiety. 03/16/20   [provider]    Social History   Socioeconomic History   Marital status: Divorced    Spouse name: Not on file   Number of children: Not on file   Years of education: Not on file   Highest education level: Not on file  Occupational History   Not on file  Tobacco Use   Smoking status: Every Day    Current packs/day: 1.00    Average packs/day: 1 pack/day for 50.0 years (50.0 ttl pk-yrs)    Types: Cigarettes   Smokeless tobacco: Never  Vaping Use   Vaping status: Never Used  Substance and Sexual Activity   Alcohol use: Not Currently    Comment: occasional   Drug use: No   Sexual activity: Yes  Other Topics Concern   Not on file  Social History Narrative   Not on file   Social Determinants of Health   Financial Resource Strain: Low Risk  (07/14/2022)   Received from [facility]   Overall Financial Resource Strain (CARDIA)    Difficulty of Paying Living Expenses: Not hard at all  Food Insecurity: No Food Insecurity (12/28/2022)   Hunger Vital Sign    Worried About Running Out of Food in the Last Year: Never true    Ran Out of Food in the Last Year: Never true  Transportation Needs: No Transportation Needs (12/28/2022)   PRAPARE - Administrator, Civil Service (Medical): No    Lack of Transportation (Non-Medical): No  Physical Activity: Not on file  Stress: Not on file  Social Connections: Not on file  Intimate Partner Violence: Not At Risk (12/28/2022)   Humiliation,  Afraid, Rape, and Kick questionnaire    Fear of Current or Ex-Partner: No    Emotionally Abused: No    Physically Abused: No    Sexually Abused: No     Family History  Problem Relation Age of Onset   Asthma Mother    Heart attack Father 66   Heart attack Son 53   Prostate cancer Brother     ROS: [x]  Positive   [ ]  Negative   [ ]  All sytems reviewed and are negative  Cardiovascular: []  chest pain/pressure []  palpitations []  SOB lying flat []  DOE []  pain in legs while walking []  pain in legs at rest []  pain in legs at night []  non-healing ulcers []  hx of DVT []  swelling in  legs  Pulmonary: []  productive cough []  asthma/wheezing []  home O2  Neurologic: []  weakness in []  arms []  legs []  numbness in []  arms []  legs []  hx of CVA []  mini stroke [] difficulty speaking or slurred speech []  temporary loss of vision in one eye []  dizziness  Hematologic: []  hx of cancer []  bleeding problems []  problems with blood clotting easily  Endocrine:   []  diabetes []  thyroid disease  GI []  vomiting blood []  blood in stool  GU: []  CKD/renal failure []  HD--[]  M/W/F or []  T/T/S []  burning with urination []  blood in urine  Psychiatric: []  anxiety []  depression  Musculoskeletal: []  arthritis []  joint pain  Integumentary: []  rashes []  ulcers  Constitutional: []  fever []  chills   Physical Examination  Vitals:   12/28/22 1837 12/28/22 1956  BP: 139/63 131/71  Pulse: 71 69  Resp: 12 (!) 21  Temp: 98.1 F (36.7 C) 97.9 F (36.6 C)  SpO2: 93% 95%   Body mass index is 31.07 kg/m.  General:  WDWN in NAD Gait: Not observed HENT: WNL, normocephalic Pulmonary: normal non-labored breathing Cardiac: regular, without  Murmurs, rubs or gallops Abdomen: Soft, left-sided tenderness, no rebound or guarding Vascular Exam/Pulses: Weakly palpable femoral pulses bilaterally Musculoskeletal: no muscle wasting or atrophy  Neurologic: A&O X 3; Appropriate Affect ;  SENSATION: normal; MOTOR FUNCTION:  moving all extremities equally. Speech is fluent/normal   CBC    Component Value Date/Time   WBC 9.8 07/22/2022 1529   RBC 4.53 07/22/2022 1529   HGB 13.2 07/22/2022 1529   HCT 43.2 07/22/2022 1529   PLT 229 07/22/2022 1529   MCV 95.4 07/22/2022 1529   MCH 29.1 07/22/2022 1529   MCHC 30.6 07/22/2022 1529   RDW 15.2 07/22/2022 1529   LYMPHSABS 1.5 07/22/2022 1529   MONOABS 1.0 07/22/2022 1529   EOSABS 0.2 07/22/2022 1529   BASOSABS 0.0 07/22/2022 1529    BMET    Component Value Date/Time   NA 140 07/22/2022 1529   K 3.6 07/22/2022 1529   CL 101 07/22/2022 1529   CO2 29 07/22/2022 1529   GLUCOSE 97 07/22/2022 1529   BUN 10 07/22/2022 1529   CREATININE 0.65 07/22/2022 1529   CALCIUM 8.7 (L) 07/22/2022 1529   GFRNONAA >60 07/22/2022 1529   GFRAA >60 02/24/2018 1937    COAGS: Lab Results  Component Value Date   INR 1.07 09/01/2016     Non-Invasive Vascular Imaging:      CT abdomen pelvis 12/28/22 reviewed with 50% infarction of the left kidney.  The main left renal artery is patent from the origin out to the hilum.  There is some plaque at the origin of the left renal artery.   ASSESSMENT/PLAN: This is a  77 y.o. female with hx COPD, HTN, tobacco abuse, and CAD that vascular surgery was consulted for left renal infarct.  I reviewed the CT scan with the patient's daughter showing approximate 50% infarction of the left kidney.  The main left renal artery appears patent although there is some plaque at the origin.  Discussed that there is no indication for percutaneous intervention or other surgical intervention and that what portion of the kidney is infarcted will be permanently damaged.  The main renal artery appears patent and the thrombosis is out distally in the hilum.  Recommend echocardiogram to complete her cardioembolic workup.  She had a CT Chest earlier this year in 06/2022 and I do not see any atheroembolic source in the chest.  Heparin is fine while she is undergoing further workup.  I will get a renal artery duplex tomorrow.  Cephus Shelling, MD [facility] Office: [phone]  Cephus Shelling

## 2022-12-28 NOTE — Plan of Care (Signed)
Transfer from [facility]. Patient is a 77 year old female who presented with complaints of abdominal pain found to have renal thromboembolism and infarct on CT scan of the abdomen pelvis.  Patient was started on heparin.  Case had been discussed with vascular surgery who agreed to see in consultation.  EKG reported to be abnormal.  Labs reported within normal limits.  Accept to a telemetry bed.

## 2022-12-28 NOTE — H&P (Signed)
History and Physical    Patient: Lisa [name] ZOX:[id number] DOB: 07/20/1945 DOA: 12/28/2022 DOS: the patient was seen and examined on 12/28/2022 PCP: Kirstie Peri, MD  Patient coming from: Outside Hospital  Chief Complaint: No chief complaint on file.  HPI: Lisa [name] is a 77 y.o. female with medical history significant for COPD, tobacco abuse, hypertension, and coronary artery disease with history of drug-eluting stent 6 years ago.  She developed acute onset abdominal pain at approximately 4:00 this morning.  She was up having some coffee when the pain started.  She moved around and did everything she could but the pain just got worse as the hours went by.  Eventually she presented to an outside emergency department.  Her workup included a CT scan of the abdomen pelvis which revealed the renal infarct which involved greater than 50% of her left kidney.  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  The patient was started on IV heparin and was transferred here.  She continues to have pain but denies fevers or chills or chest pain or nausea or vomiting or diarrhea.   Review of Systems: As mentioned in the history of present illness. All other systems reviewed and are negative. Past Medical History:  Diagnosis Date   Asthma    COPD (chronic obstructive pulmonary disease) (HCC)    Coronary artery disease    Cough    " ALL MY LIFE "   Dyspnea    GERD (gastroesophageal reflux disease)    Hypertension    NSTEMI (non-ST elevated myocardial infarction) (HCC) 09/01/2016   DES to Cx/OM bifurcation   Unstable angina (HCC) 08/2016   Past Surgical History:  Procedure Laterality Date   ABDOMINAL HYSTERECTOMY     CORONARY STENT INTERVENTION  09/05/2016   Successful complex PCI of the circumflex/OM bifurcation using a Synergy DES   CORONARY STENT INTERVENTION N/A 09/05/2016   Procedure: Coronary Stent Intervention;  Surgeon: Tonny Bollman, MD;  Location: [facility] INVASIVE CV LAB;  Service:  Cardiovascular;  Laterality: N/A;   LEFT HEART CATH AND CORONARY ANGIOGRAPHY N/A 09/02/2016   Procedure: Left Heart Cath and Coronary Angiography;  Surgeon: Corky Crafts, MD;  Location: [facility] INVASIVE CV LAB;  Service: Cardiovascular;  Laterality: N/A;   Social History:  reports that she has been smoking cigarettes. She has a 50 pack-year smoking history. She has never used smokeless tobacco. She reports that she does not currently use alcohol. She reports that she does not use drugs.  Allergies  Allergen Reactions   Brilinta [Ticagrelor] Shortness Of Breath   Penicillins Anaphylaxis    Has patient had a PCN reaction causing immediate rash, facial/tongue/throat swelling, SOB or lightheadedness with hypotension: Yes Has patient had a PCN reaction causing severe rash involving mucus membranes or skin necrosis: yes Has patient had a PCN reaction that required hospitalization: no Has patient had a PCN reaction occurring within the last 10 years: no If all of the above answers are "NO", then may proceed with Cephalosporin use.    Ranexa [Ranolazine] Shortness Of Breath   Sulfa Antibiotics Anaphylaxis and Rash    Blistering rash on hands and feet   Crestor [Rosuvastatin] Other (See Comments)    Myalgias    Family History  Problem Relation Age of Onset   Asthma Mother    Heart attack Father 40   Heart attack Son 25   Prostate cancer Brother     Prior to Admission medications   Medication Sig Start Date End Date Taking? Authorizing  Provider  acetaminophen (TYLENOL) 500 MG tablet Take 1,000 mg by mouth every 6 (six) hours as needed for moderate pain.   Yes [provider]  aspirin EC 81 MG EC tablet Take 1 tablet (81 mg total) by mouth daily. 09/07/16  Yes Duke, Roe Rutherford, PA  Cholecalciferol (VITAMIN D3) 125 MCG (5000 UT) CAPS Take 5,000 Units by mouth daily.   Yes [provider]  Coenzyme Q10 (COQ10) 100 MG CAPS Take 100 mg by mouth daily.   Yes [provider]  Cyanocobalamin (VITAMIN B-12 PO) Take 1 tablet by mouth daily.   Yes [provider]  docusate sodium (COLACE) 100 MG capsule Take 100 mg by mouth daily.   Yes [provider]  esomeprazole (NEXIUM) 20 MG capsule Take 20 mg by mouth daily at 12 noon.   Yes [provider]  fluocinonide ointment (LIDEX) 0.05 % Apply 1 Application topically 2 (two) times daily.   Yes [provider]  fluticasone-salmeterol (ADVAIR HFA) 115-21 MCG/ACT inhaler Inhale 2 puffs into the lungs at bedtime.   Yes [provider]  furosemide (LASIX) 20 MG tablet Take 20 mg by mouth daily as needed. 12/14/22  Yes [provider]  Glucosamine-Chondroitin (MOVE FREE PO) Take 1 tablet by mouth daily.   Yes [provider]  lisinopril (ZESTRIL) 20 MG tablet Take 20 mg by mouth daily. 03/23/20  Yes [provider]  metoprolol succinate (TOPROL XL) 25 MG 24 hr tablet Take 0.5 tablets (12.5 mg total) by mouth 2 (two) times daily. Patient taking differently: Take 25 mg by mouth daily. 04/08/19  Yes Pricilla Riffle, MD  nitroGLYCERIN (NITROSTAT) 0.4 MG SL tablet Place 1 tablet (0.4 mg total) under the tongue every 5 (five) minutes as needed for chest pain. 09/06/16  Yes Duke, Roe Rutherford, PA  potassium chloride (KLOR-CON M) 10 MEQ tablet Take 10 mEq by mouth as needed. 12/14/22  Yes [provider]  Probiotic Product (PROBIOTIC PO) Take 1 capsule by mouth daily.   Yes [provider]  diazepam (VALIUM) 2 MG tablet Take 2 mg by mouth daily as needed for anxiety. 03/16/20   [provider]    Physical Exam: Vitals:   12/28/22 1837 12/28/22 1956  BP: 139/63   Pulse: 71   Resp: 12   Temp: 98.1 F (36.7 C) 97.9 F (36.6 C)  TempSrc: Oral Oral  SpO2: 93%   Weight:  84.7 kg  Height:  5\' 5"  (1.651 m)   Physical Exam:  General: No acute distress, well developed, well nourished HEENT: Normocephalic, atraumatic,  PERRL Cardiovascular: Normal rate and rhythm. Distal pulses intact. Pulmonary: Normal pulmonary effort, mild exp wheezing in bases Gastrointestinal: Nondistended abdomen, soft, tender in periumbilical area and left lower quad, hypoactive bowel sounds Musculoskeletal:Normal ROM, no lower ext edema Lymphadenopathy: No cervical LAD. Skin: Skin is warm and dry. Neuro: No focal deficits noted, AAOx3. PSYCH: Attentive and cooperative  Data Reviewed:  No results found for this or any previous visit (from the past 24 hour(s)).   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 - Echo ordered - Since greater than 50% of the kidney is involved, consider urology or nephrology consult   2. COPD - Nebs   3. Htn- Continue Metoprolol. Hold Ace for forst 24 hours to monitor kidney function.   Advance Care Planning:   Code Status: Full Code the patient names her daughter [name] as her surrogate  decision-maker and she wants to be full code.  Consults: none  Family Communication: None  Severity of Illness: The appropriate patient status for this patient is INPATIENT. Inpatient status is judged to be reasonable and necessary in order to provide the required intensity of service to ensure the patient's safety. The patient's presenting symptoms, physical exam findings, and initial radiographic and laboratory data in the context of their chronic comorbidities is felt to place them at high risk for further clinical deterioration. Furthermore, it is not anticipated that the patient will be medically stable for discharge from the hospital within 2 midnights of admission.   * I certify that at the point of admission it is my clinical judgment that the patient will require inpatient hospital care spanning beyond 2 midnights from the point of admission due to high intensity of service, high risk for further deterioration and high frequency of surveillance  required.*  Author: Buena Irish, MD 12/28/2022 8:00 PM  For on call review [email or web address].

## 2022-12-28 NOTE — Progress Notes (Addendum)
PHARMACY - ANTICOAGULATION CONSULT NOTE  Pharmacy Consult for Heparin Indication: renal artery thromboembolism  Allergies  Allergen Reactions   Brilinta [Ticagrelor] Shortness Of Breath   Penicillins Anaphylaxis    Has patient had a PCN reaction causing immediate rash, facial/tongue/throat swelling, SOB or lightheadedness with hypotension: Yes Has patient had a PCN reaction causing severe rash involving mucus membranes or skin necrosis: yes Has patient had a PCN reaction that required hospitalization: no Has patient had a PCN reaction occurring within the last 10 years: no If all of the above answers are "NO", then may proceed with Cephalosporin use.    Ranexa [Ranolazine] Shortness Of Breath   Sulfa Antibiotics Anaphylaxis and Rash    Blistering rash on hands and feet   Crestor [Rosuvastatin] Other (See Comments)    Myalgias    Patient Measurements: Height: 5\' 5"  (165.1 cm) Weight: 84.7 kg (186 lb 11.7 oz) IBW/kg (Calculated) : 57 Heparin Dosing Weight: 75.3 kg  Vital Signs: Temp: 97.9 F (36.6 C) (10/30 1956) Temp Source: Oral (10/30 1956) BP: 131/71 (10/30 1956) Pulse Rate: 69 (10/30 1956)  Labs: No results for input(s): "HGB", "HCT", "PLT", "APTT", "LABPROT", "INR", "HEPARINUNFRC", "HEPRLOWMOCWT", "CREATININE", "CKTOTAL", "CKMB", "TROPONINIHS" in the last 72 hours.  CrCl cannot be calculated (Patient's most recent lab result is older than the maximum 21 days allowed.).   Medical History: Past Medical History:  Diagnosis Date   Asthma    COPD (chronic obstructive pulmonary disease) (HCC)    Coronary artery disease    Cough    " ALL MY LIFE "   Dyspnea    GERD (gastroesophageal reflux disease)    Hypertension    NSTEMI (non-ST elevated myocardial infarction) (HCC) 09/01/2016   DES to Cx/OM bifurcation   Unstable angina (HCC) 08/2016    Medications:  Scheduled:   aspirin EC  81 mg Oral Daily   docusate sodium  100 mg Oral Daily   ipratropium-albuterol  3  mL Nebulization QID   metoprolol succinate  25 mg Oral Daily   pantoprazole  40 mg Oral Daily   Infusions:   heparin 1,100 Units/hr (12/28/22 2018)   PRN: acetaminophen **OR** acetaminophen, HYDROmorphone (DILAUDID) injection, ondansetron **OR** ondansetron (ZOFRAN) IV, oxyCODONE, sorbitol  Assessment: 77 yo female transferred from [facility] on a heparin drip for renal artery thromboembolism. Started ~1200 today - 5000 unit IV bolus + 1100 units/hr infusion. Hgb 11.5, Plts 198, SCr 0.95. Patient not on anticoagulants PTA.  Goal of Therapy:  Heparin level 0.3-0.7 units/ml Monitor platelets by anticoagulation protocol: Yes   Plan:  Check heparin level now Check heparin level and CBC daily Monitor for signs/symptoms of bleeding  Loralee Pacas, PharmD, BCPS 12/28/2022,8:47 PM  Please check AMION for all [facility] Pharmacy phone numbers After 10:00 PM, call Main Pharmacy [phone]  ADDENDUM (2215) Heparin level therapeutic (0.33) on infusion at 1100 units/hr. No bleeding noted. Continue same hepairn and f/u heparin level in a.m.  Christoper Fabian, PharmD, BCPS Please see amion for complete clinical pharmacist phone list 12/28/2022 10:16 PM

## 2022-12-29 ENCOUNTER — Other Ambulatory Visit (HOSPITAL_COMMUNITY): Payer: Self-pay

## 2022-12-29 ENCOUNTER — Inpatient Hospital Stay (HOSPITAL_COMMUNITY): Payer: Medicare Other

## 2022-12-29 DIAGNOSIS — N280 Ischemia and infarction of kidney: Secondary | ICD-10-CM | POA: Diagnosis not present

## 2022-12-29 DIAGNOSIS — R9431 Abnormal electrocardiogram [ECG] [EKG]: Secondary | ICD-10-CM

## 2022-12-29 LAB — COMPREHENSIVE METABOLIC PANEL
ALT: 114 U/L — ABNORMAL HIGH (ref 0–44)
AST: 131 U/L — ABNORMAL HIGH (ref 15–41)
Albumin: 3 g/dL — ABNORMAL LOW (ref 3.5–5.0)
Alkaline Phosphatase: 103 U/L (ref 38–126)
Anion gap: 8 (ref 5–15)
BUN: 10 mg/dL (ref 8–23)
CO2: 27 mmol/L (ref 22–32)
Calcium: 8.7 mg/dL — ABNORMAL LOW (ref 8.9–10.3)
Chloride: 100 mmol/L (ref 98–111)
Creatinine, Ser: 1.09 mg/dL — ABNORMAL HIGH (ref 0.44–1.00)
GFR, Estimated: 52 mL/min — ABNORMAL LOW (ref >60)
Glucose, Bld: 139 mg/dL — ABNORMAL HIGH (ref 70–99)
Potassium: 4 mmol/L (ref 3.5–5.1)
Sodium: 135 mmol/L (ref 135–145)
Total Bilirubin: 0.9 mg/dL (ref 0.3–1.2)
Total Protein: 6.3 g/dL — ABNORMAL LOW (ref 6.5–8.1)

## 2022-12-29 LAB — LIPID PANEL
Cholesterol: 126 mg/dL (ref 0–200)
HDL: 34 mg/dL — ABNORMAL LOW (ref >40)
LDL Cholesterol: 74 mg/dL (ref 0–99)
Total CHOL/HDL Ratio: 3.7 {ratio}
Triglycerides: 89 mg/dL (ref <150)
VLDL: 18 mg/dL (ref 0–40)

## 2022-12-29 LAB — ECHOCARDIOGRAM COMPLETE
AR max vel: 2.51 cm2
AV Area VTI: 2.82 cm2
AV Area mean vel: 2.41 cm2
AV Mean grad: 6 mm[Hg]
AV Peak grad: 11.3 mm[Hg]
Ao pk vel: 1.68 m/s
Area-P 1/2: 3.85 cm2
Height: 65 in
S' Lateral: 2.5 cm
Weight: 2987.67 [oz_av]

## 2022-12-29 LAB — TYPE AND SCREEN
ABO/RH(D): O POS
Antibody Screen: NEGATIVE

## 2022-12-29 LAB — CBC
HCT: 43.3 % (ref 36.0–46.0)
Hemoglobin: 13.5 g/dL (ref 12.0–15.0)
MCH: 29.1 pg (ref 26.0–34.0)
MCHC: 31.2 g/dL (ref 30.0–36.0)
MCV: 93.3 fL (ref 80.0–100.0)
Platelets: 163 10*3/uL (ref 150–400)
RBC: 4.64 MIL/uL (ref 3.87–5.11)
RDW: 15.4 % (ref 11.5–15.5)
WBC: 8.5 10*3/uL (ref 4.0–10.5)
nRBC: 0 % (ref 0.0–0.2)

## 2022-12-29 LAB — HEPARIN LEVEL (UNFRACTIONATED)
Heparin Unfractionated: 0.28 [IU]/mL — ABNORMAL LOW (ref 0.30–0.70)
Heparin Unfractionated: 0.31 [IU]/mL (ref 0.30–0.70)

## 2022-12-29 LAB — TSH: TSH: 2.824 u[IU]/mL (ref 0.350–4.500)

## 2022-12-29 MED ORDER — DEXTROSE-SODIUM CHLORIDE 5-0.45 % IV SOLN: INTRAVENOUS | Status: DC

## 2022-12-29 MED ORDER — UMECLIDINIUM BROMIDE 62.5 MCG/ACT IN AEPB
1.0000 | INHALATION_SPRAY | Freq: Every day | RESPIRATORY_TRACT | Status: DC
  Administered 2022-12-31: 1 via RESPIRATORY_TRACT
  Filled 2022-12-29: qty 7

## 2022-12-29 MED ORDER — IPRATROPIUM-ALBUTEROL 0.5-2.5 (3) MG/3ML IN SOLN: 3.0000 mL | Freq: Four times a day (QID) | RESPIRATORY_TRACT | Status: DC | PRN

## 2022-12-29 MED ORDER — FLUTICASONE FUROATE-VILANTEROL 100-25 MCG/ACT IN AEPB
1.0000 | INHALATION_SPRAY | Freq: Every day | RESPIRATORY_TRACT | Status: DC
  Administered 2022-12-31: 1 via RESPIRATORY_TRACT
  Filled 2022-12-29: qty 28

## 2022-12-29 NOTE — Progress Notes (Signed)
Renal artery duplex  has been completed. Refer to [facility] under chart review to view preliminary results.   12/29/2022  11:01 AM Odalis Jordan, Gerarda Gunther

## 2022-12-29 NOTE — Progress Notes (Signed)
[facility] [place]  Subjective  -still having left abdominal pain   Objective 103/61 74 99.3 F (37.4 C) (Oral) 11 93% No intake or output data in the 24 hours ending 12/29/22 1411    Laboratory Lab Results: Recent Labs    12/29/22 0606  WBC 8.5  HGB 13.5  HCT 43.3  PLT 163   BMET Recent Labs    12/29/22 0606  NA 135  K 4.0  CL 100  CO2 27  GLUCOSE 139*  BUN 10  CREATININE 1.09*  CALCIUM 8.7*    COAG Lab Results  Component Value Date   INR 1.07 09/01/2016   No results found for: "PTT"  Assessment/Planning:  77 year old female evaluated after transfer from [facility] for approximately 50% infarction of the left kidney.  Concern for atheroembolic event and echocardiogram is still pending.  I reviewed her CTA chest earlier this year with no obvious source for atheroembolism.  Renal duplex confirmed that the left renal artery is patent without flow-limiting stenosis which is what I suspected after review of the CT last night.  Again discussed there is no surgical indication and whatever kidney is infarcted will not recover.  Will be pain and expectant management.    Cephus Shelling 12/29/2022 2:11 PM --

## 2022-12-29 NOTE — TOC Benefit Eligibility Note (Signed)
Patient Product/process development scientist completed.    The patient is insured through [facility]. Patient has Medicare and is not eligible for a copay card, but may be able to apply for patient assistance, if available.    Ran test claim for Eliquis 5 mg and the current 30 day co-pay is $4.60.  Ran test claim for Xarelto 20 mg and the current 30 day co-pay is $4.60.  Ran test claim for warfarin (Coumadin) 5 mg and the current 30 day co-pay is $0.00   This test claim was processed through [employer]- copay amounts may vary at other pharmacies due to [employer], or as the patient moves through the different stages of their insurance plan.     Roland Earl, CPHT Pharmacy Technician III Certified Patient Advocate [facility] Patient Advocate Team Direct Number: [phone]  Fax: [phone]

## 2022-12-29 NOTE — Progress Notes (Signed)
PHARMACY - ANTICOAGULATION CONSULT NOTE  Pharmacy Consult for Heparin Indication: renal artery thromboembolism  Allergies  Allergen Reactions   Brilinta [Ticagrelor] Shortness Of Breath   Penicillins Anaphylaxis    Has patient had a PCN reaction causing immediate rash, facial/tongue/throat swelling, SOB or lightheadedness with hypotension: Yes Has patient had a PCN reaction causing severe rash involving mucus membranes or skin necrosis: yes Has patient had a PCN reaction that required hospitalization: no Has patient had a PCN reaction occurring within the last 10 years: no If all of the above answers are "NO", then may proceed with Cephalosporin use.    Ranexa [Ranolazine] Shortness Of Breath   Sulfa Antibiotics Anaphylaxis and Rash    Blistering rash on hands and feet   Crestor [Rosuvastatin] Other (See Comments)    Myalgias    Patient Measurements: Height: 5\' 5"  (165.1 cm) Weight: 84.7 kg (186 lb 11.7 oz) IBW/kg (Calculated) : 57 Heparin Dosing Weight: 75.3 kg  Vital Signs: BP: 100/60 (10/31 1200) Pulse Rate: 71 (10/31 1200)  Labs: Recent Labs    12/28/22 2111 12/29/22 0606 12/29/22 1936  HGB  --  13.5  --   HCT  --  43.3  --   PLT  --  163  --   HEPARINUNFRC 0.34 0.28* 0.31  CREATININE  --  1.09*  --     Estimated Creatinine Clearance: 45 mL/min (A) (by C-G formula based on SCr of 1.09 mg/dL (H)).   Medical History: Past Medical History:  Diagnosis Date   Asthma    COPD (chronic obstructive pulmonary disease) (HCC)    Coronary artery disease    Cough    " ALL MY LIFE "   Dyspnea    GERD (gastroesophageal reflux disease)    Hypertension    NSTEMI (non-ST elevated myocardial infarction) (HCC) 09/01/2016   DES to Cx/OM bifurcation   Unstable angina (HCC) 08/2016    Medications:  Scheduled:   aspirin EC  81 mg Oral Daily   docusate sodium  100 mg Oral Daily   fluticasone furoate-vilanterol  1 puff Inhalation Daily   metoprolol succinate  25 mg Oral  Daily   pantoprazole  40 mg Oral Daily   umeclidinium bromide  1 puff Inhalation Daily   Infusions:   dextrose 5 % and 0.45 % NaCl 75 mL/hr at 12/29/22 1603   heparin 1,250 Units/hr (12/29/22 1207)   PRN: acetaminophen **OR** acetaminophen, HYDROmorphone (DILAUDID) injection, ipratropium-albuterol, ondansetron **OR** ondansetron (ZOFRAN) IV, oxyCODONE, sorbitol  Assessment: 77 yo female transferred from [facility] on a heparin drip for renal artery thromboembolism.  Patient not on anticoagulants PTA. BMI 31. CBC within normal. LFTs increased  on 10/31  AST/ALT 12/16 increased to 131/114.  Heparin level decreased to subtherapeutic (0.28) on infusion at 1100 units/hr. No issues with heparin infusion reported. No bleeding noted.  Will increase heparin rate and f/u heparin level in 6-8 hours.  Copay check for 30 day supply: Eliquis copay is $4.60, Xarelto copay is $4.60, Warfarin 5 mg copay is $0.00  10/31 PM: heparin level 0.31 (therapeutic) on heparin 1250 units/hr. No issues with the infusion or bleeding reported.  Goal of Therapy:  Heparin level 0.3-0.7 units/ml Monitor platelets by anticoagulation protocol: Yes   Plan:  Continue Heparin infusion at 1250 units/hr  Check confirmatory heparin level with AM labs.  Check heparin level and CBC daily Monitor for signs/symptoms of bleeding  Thank you for allowing pharmacy to be part of this patients care team.  Loralee Pacas,  PharmD, BCPS Clinical Pharmacist  Please check AMION for all [facility] Pharmacy phone numbers After 10:00 PM, call Main Pharmacy [phone] 12/29/2022 8:20 PM

## 2022-12-29 NOTE — Plan of Care (Signed)

## 2022-12-29 NOTE — Progress Notes (Signed)
PHARMACY - ANTICOAGULATION CONSULT NOTE  Pharmacy Consult for Heparin Indication: renal artery thromboembolism  Allergies  Allergen Reactions   Brilinta [Ticagrelor] Shortness Of Breath   Penicillins Anaphylaxis    Has patient had a PCN reaction causing immediate rash, facial/tongue/throat swelling, SOB or lightheadedness with hypotension: Yes Has patient had a PCN reaction causing severe rash involving mucus membranes or skin necrosis: yes Has patient had a PCN reaction that required hospitalization: no Has patient had a PCN reaction occurring within the last 10 years: no If all of the above answers are "NO", then may proceed with Cephalosporin use.    Ranexa [Ranolazine] Shortness Of Breath   Sulfa Antibiotics Anaphylaxis and Rash    Blistering rash on hands and feet   Crestor [Rosuvastatin] Other (See Comments)    Myalgias    Patient Measurements: Height: 5\' 5"  (165.1 cm) Weight: 84.7 kg (186 lb 11.7 oz) IBW/kg (Calculated) : 57 Heparin Dosing Weight: 75.3 kg  Vital Signs: Temp: 99.3 F (37.4 C) (10/31 0807) Temp Source: Oral (10/31 0807) BP: 103/61 (10/31 0807) Pulse Rate: 74 (10/31 0807)  Labs: Recent Labs    12/28/22 2111 12/29/22 0606  HGB  --  13.5  HCT  --  43.3  PLT  --  163  HEPARINUNFRC 0.34 0.28*  CREATININE  --  1.09*    Estimated Creatinine Clearance: 45 mL/min (A) (by C-G formula based on SCr of 1.09 mg/dL (H)).   Medical History: Past Medical History:  Diagnosis Date   Asthma    COPD (chronic obstructive pulmonary disease) (HCC)    Coronary artery disease    Cough    " ALL MY LIFE "   Dyspnea    GERD (gastroesophageal reflux disease)    Hypertension    NSTEMI (non-ST elevated myocardial infarction) (HCC) 09/01/2016   DES to Cx/OM bifurcation   Unstable angina (HCC) 08/2016    Medications:  Scheduled:   aspirin EC  81 mg Oral Daily   docusate sodium  100 mg Oral Daily   fluticasone furoate-vilanterol  1 puff Inhalation Daily    metoprolol succinate  25 mg Oral Daily   pantoprazole  40 mg Oral Daily   umeclidinium bromide  1 puff Inhalation Daily   Infusions:   heparin 1,100 Units/hr (12/29/22 0736)   PRN: acetaminophen **OR** acetaminophen, HYDROmorphone (DILAUDID) injection, ipratropium-albuterol, ondansetron **OR** ondansetron (ZOFRAN) IV, oxyCODONE, sorbitol  Assessment: 77 yo female transferred from [facility] on a heparin drip for renal artery thromboembolism.  Patient not on anticoagulants PTA. BMI 31. CBC within normal. LFTs increased  on 10/31  AST/ALT 12/16 increased to 131/114.  Heparin level decreased to subtherapeutic (0.28) on infusion at 1100 units/hr. No issues with heparin infusion reported. No bleeding noted.  Will increase heparin rate and f/u heparin level in 6-8 hours.  Copay check for 30 day supply: Eliquis copay is $4.60, Xarelto copay is $4.60, Warfarin 5 mg copay is $0.00  Goal of Therapy:  Heparin level 0.3-0.7 units/ml Monitor platelets by anticoagulation protocol: Yes   Plan:  Increase Heparin infusion rate to 1250 units/hr  Check heparin level 8hr after rate increase.  Check heparin level and CBC daily Monitor for signs/symptoms of bleeding  Thank you for allowing pharmacy to be part of this patients care team.  Noah Delaine, RPh Clinical Pharmacist  Please check AMION for all [facility] Pharmacy phone numbers After 10:00 PM, call Main Pharmacy [phone] 12/29/2022 11:02 AM

## 2022-12-29 NOTE — Progress Notes (Signed)
PROGRESS NOTE    Lisa [name]  VQQ:[id number] DOB: 05/21/1945 DOA: 12/28/2022 PCP: Kirstie Peri, MD   No chief complaint on file.   Brief Narrative:   Lisa [name] is a 77 y.o. female with medical history significant for COPD, tobacco abuse, hypertension, and coronary artery disease with history of drug-eluting stent 6 years ago.  She developed acute onset abdominal pain at approximately 4:00 this morning.  She was up having some coffee when the pain started.  She moved around and did everything she could but the pain just got worse as the hours went by.  Eventually she presented to an outside emergency department.  Her workup included a CT scan of the abdomen pelvis which revealed the renal infarct which involved greater than 50% of her left kidney.  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  The patient was started on IV heparin and was transferred here.  She continues to have pain but denies fevers or chills or chest pain or nausea or vomiting or diarrhea.   Assessment & Plan:   Principal Problem:   Renal infarction [facility]) Active Problems:   COPD (chronic obstructive pulmonary disease) (HCC)  Left renal infarct  -This most likely due to thromboembolic/atheroembolic event, no clear etiology so far, continue with heparin GTT over the next 24 hours, likely will transition to DOAC as an outpatient for 51-month discussed with vascular surgery -echo with no acute finding or concern for thrombosis  -Likely she will need 30 days event monitor as an outpatient to evaluate for source of thrombosis -As discussed with vascular surgery, renal duplex confirmed that left renal artery is patent without flow-limiting stenosis.  AKI -Creatinine is up this morning 0.65-1.09 -continue with IV fluids  COPD - Nebs    Htn - Continue Metoprolol. Hold Ace for forst 24 hours to monitor kidney function.   DVT prophylaxis: Heparin GTT Code Status: full Family Communication: D/W daughter at  bedside Disposition:   Status is: Inpatient    Consultants:  Vascular Surgery   Subjective:  No significant events overnight as discussed with staff, she does report some nausea, vomiting overnight, appears to be improving today, she did have left flank pain as well  Objective: Vitals:   12/29/22 0000 12/29/22 0200 12/29/22 0400 12/29/22 0807  BP: 95/67  118/66 103/61  Pulse: 64 74 71 74  Resp: 13 18 20 11   Temp:    99.3 F (37.4 C)  TempSrc:    Oral  SpO2: 92% 91% 93% 93%  Weight:      Height:       No intake or output data in the 24 hours ending 12/29/22 1446 Filed Weights   12/28/22 1956  Weight: 84.7 kg    Examination:  Awake Alert, Oriented X 3, No new F.N deficits, Normal affect Symmetrical Chest wall movement, Good air movement bilaterally, CTAB RRR,No Gallops,Rubs or new Murmurs, No Parasternal Heave +ve B.Sounds, Abd Soft, No tenderness, No rebound - guarding or rigidity. No Cyanosis, Clubbing or edema, No new Rash or bruise       Data Reviewed: I have personally reviewed following labs and imaging studies  CBC: Recent Labs  Lab 12/29/22 0606  WBC 8.5  HGB 13.5  HCT 43.3  MCV 93.3  PLT 163    Basic Metabolic Panel: Recent Labs  Lab 12/29/22 0606  NA 135  K 4.0  CL 100  CO2 27  GLUCOSE 139*  BUN 10  CREATININE 1.09*  CALCIUM 8.7*    GFR:  Estimated Creatinine Clearance: 45 mL/min (A) (by C-G formula based on SCr of 1.09 mg/dL (H)).  Liver Function Tests: Recent Labs  Lab 12/29/22 0606  AST 131*  ALT 114*  ALKPHOS 103  BILITOT 0.9  PROT 6.3*  ALBUMIN 3.0*    CBG: No results for input(s): "GLUCAP" in the last 168 hours.   No results found for this or any previous visit (from the past 240 hour(s)).       Radiology Studies: ECHOCARDIOGRAM COMPLETE  Result Date: 12/29/2022    ECHOCARDIOGRAM REPORT   Patient Name:   Lisa [name] Date of Exam: 12/29/2022 Medical Rec #:  [id number]        Height:       65.0 in  Accession #:    [id number]       Weight:       186.7 lb Date of Birth:  12-12-1945        BSA:          1.921 m Patient Age:    77 years         BP:           118/66 mmHg Patient Gender: F                HR:           76 bpm. Exam Location:  Inpatient Procedure: 2D Echo, Cardiac Doppler and Color Doppler Indications:    Stroke  History:        Patient has prior history of Echocardiogram examinations, most                 recent 09/02/2016. Risk Factors:Family History of Coronary Artery                 Disease and Current Smoker.  Sonographer:    Karma Ganja Referring Phys: Buena Irish  Sonographer Comments: Image acquisition challenging due to COPD. IMPRESSIONS  1. Left ventricular ejection fraction, by estimation, is 60 to 65%. The left ventricle has normal function. The left ventricle has no regional wall motion abnormalities. There is moderate left ventricular hypertrophy. Left ventricular diastolic parameters are consistent with Grade II diastolic dysfunction (pseudonormalization).  2. Right ventricular systolic function is normal. The right ventricular size is normal.  3. The mitral valve is grossly normal. No evidence of mitral valve regurgitation.  4. The aortic valve is grossly normal. Aortic valve regurgitation is not visualized.  5. The inferior vena cava is normal in size with greater than 50% respiratory variability, suggesting right atrial pressure of 3 mmHg. FINDINGS  Left Ventricle: Left ventricular ejection fraction, by estimation, is 60 to 65%. The left ventricle has normal function. The left ventricle has no regional wall motion abnormalities. The left ventricular internal cavity size was normal in size. There is  moderate left ventricular hypertrophy. Left ventricular diastolic parameters are consistent with Grade II diastolic dysfunction (pseudonormalization). Right Ventricle: The right ventricular size is normal. Right ventricular systolic function is normal. Left Atrium: Left atrial size  was normal in size. Right Atrium: Right atrial size was normal in size. Pericardium: There is no evidence of pericardial effusion. Mitral Valve: The mitral valve is grossly normal. No evidence of mitral valve regurgitation. Tricuspid Valve: Tricuspid valve regurgitation is not demonstrated. Aortic Valve: The aortic valve is grossly normal. Aortic valve regurgitation is not visualized. Aortic valve mean gradient measures 6.0 mmHg. Aortic valve peak gradient measures 11.3 mmHg. Aortic valve area, by VTI measures 2.82 cm. Pulmonic Valve: Pulmonic  valve regurgitation is not visualized. Aorta: The aortic root and ascending aorta are structurally normal, with no evidence of dilitation. Venous: The inferior vena cava is normal in size with greater than 50% respiratory variability, suggesting right atrial pressure of 3 mmHg. IAS/Shunts: The interatrial septum was not well visualized.  LEFT VENTRICLE PLAX 2D LVIDd:         4.20 cm   Diastology LVIDs:         2.50 cm   LV e' medial:    5.87 cm/s LV PW:         1.30 cm   LV E/e' medial:  19.9 LV IVS:        1.30 cm   LV e' lateral:   8.59 cm/s LVOT diam:     2.00 cm   LV E/e' lateral: 13.6 LV SV:         72 LV SV Index:   38 LVOT Area:     3.14 cm  RIGHT VENTRICLE             IVC RV Basal diam:  3.40 cm     IVC diam: 1.80 cm RV S prime:     15.60 cm/s TAPSE (M-mode): 3.1 cm LEFT ATRIUM             Index        RIGHT ATRIUM           Index LA diam:        4.00 cm 2.08 cm/m   RA Area:     13.50 cm LA Vol (A2C):   63.7 ml 33.16 ml/m  RA Volume:   25.70 ml  13.38 ml/m LA Vol (A4C):   31.3 ml 16.29 ml/m LA Biplane Vol: 47.9 ml 24.93 ml/m  AORTIC VALVE AV Area (Vmax):    2.51 cm AV Area (Vmean):   2.41 cm AV Area (VTI):     2.82 cm AV Vmax:           168.00 cm/s AV Vmean:          111.000 cm/s AV VTI:            0.256 m AV Peak Grad:      11.3 mmHg AV Mean Grad:      6.0 mmHg LVOT Vmax:         134.00 cm/s LVOT Vmean:        85.300 cm/s LVOT VTI:          0.230 m LVOT/AV  VTI ratio: 0.90  AORTA Ao Root diam: 2.90 cm Ao Asc diam:  2.80 cm MITRAL VALVE MV Area (PHT): 3.85 cm     SHUNTS MV Decel Time: 197 msec     Systemic VTI:  0.23 m MV E velocity: 117.00 cm/s  Systemic Diam: 2.00 cm MV A velocity: 90.00 cm/s MV E/A ratio:  1.30 Photographer signed by Carolan Clines Signature Date/Time: 12/29/2022/2:35:08 PM    Final    VAS US RENAL ARTERY DUPLEX  Result Date: 12/29/2022 ABDOMINAL VISCERAL Patient Name:  Lisa [name]  Date of Exam:   12/29/2022 Medical Rec #: [id number]         Accession #:    [id number] Date of Birth: 1945/03/16         Patient Gender: F Patient Age:   1 years Exam Location:  [facility] Procedure:      VAS US RENAL ARTERY DUPLEX Referring Phys: [id number] Canary Brim CLARK -------------------------------------------------------------------------------- Indications: 50% renal  infarction of the left kidney on CT scan at [facility]. High Risk Factors: Hypertension, coronary artery disease. Other Factors: GERD, COPD, tobacco abuse. Comparison Study: No priors. Performing Technologist: Marilynne Halsted RDMS, RVT  Examination Guidelines: A complete evaluation includes B-mode imaging, spectral Doppler, color Doppler, and power Doppler as needed of all accessible portions of each vessel. Bilateral testing is considered an integral part of a complete examination. Limited examinations for reoccurring indications may be performed as noted.  Duplex Findings: +--------------------+--------+--------+------+--------------+ Mesenteric          PSV cm/sEDV cm/sPlaque   Comments    +--------------------+--------+--------+------+--------------+ Aorta Prox             67                                +--------------------+--------+--------+------+--------------+ Celiac Artery Origin                      not visualized +--------------------+--------+--------+------+--------------+ SMA Origin                                 not visualized +--------------------+--------+--------+------+--------------+    +------------------+--------+--------+-------+ Right Renal ArteryPSV cm/sEDV cm/sComment +------------------+--------+--------+-------+ Origin              155      39           +------------------+--------+--------+-------+ Proximal            158      35           +------------------+--------+--------+-------+ Mid                 135      36           +------------------+--------+--------+-------+ Distal               89      26           +------------------+--------+--------+-------+ +-----------------+--------+--------+-------+ Left Renal ArteryPSV cm/sEDV cm/sComment +-----------------+--------+--------+-------+ Origin             140      31           +-----------------+--------+--------+-------+ Proximal           142      28           +-----------------+--------+--------+-------+ Mid                143      24           +-----------------+--------+--------+-------+ Distal              87      17           +-----------------+--------+--------+-------+ +------------+--------+--------+----+-----------+--------+--------+----+ Right KidneyPSV cm/sEDV cm/sRI  Left KidneyPSV cm/sEDV cm/sRI   +------------+--------+--------+----+-----------+--------+--------+----+ Upper Pole  35      12      0.66Upper Pole 33      10      0.69 +------------+--------+--------+----+-----------+--------+--------+----+ Mid         49      14      0.        45      15      0.67 +------------+--------+--------+----+-----------+--------+--------+----+ Lower Pole  53      17  0.68Lower Pole 34      11      0.69 +------------+--------+--------+----+-----------+--------+--------+----+ Hilar       65      16      0.76Hilar      47      12      0.75 +------------+--------+--------+----+-----------+--------+--------+----+  +------------------+-----+------------------+-----+ Right Kidney           Left Kidney             +------------------+-----+------------------+-----+ RAR                    RAR                     +------------------+-----+------------------+-----+ RAR (manual)      2.3  RAR (manual)      2.1   +------------------+-----+------------------+-----+ Cortex                 Cortex                  +------------------+-----+------------------+-----+ Cortex thickness       Corex thickness         +------------------+-----+------------------+-----+ Kidney length (cm)11.94Kidney length (cm)13.28 +------------------+-----+------------------+-----+   Summary: Renal:  Right: Patent renal artery without significant stenosis noted. The        resisitive index in the mid pole appears slightly elevated.        Normal size right kidney. Left:  Patent renal artery without significant stenosis noted. The        resisitive index appears within normal limits. Left kidney        appears larger in size when compared to the right kidney.  *See table(s) above for measurements and observations.     Preliminary         Scheduled Meds:  aspirin EC  81 mg Oral Daily   docusate sodium  100 mg Oral Daily   fluticasone furoate-vilanterol  1 puff Inhalation Daily   metoprolol succinate  25 mg Oral Daily   pantoprazole  40 mg Oral Daily   umeclidinium bromide  1 puff Inhalation Daily   Continuous Infusions:  heparin 1,250 Units/hr (12/29/22 1207)     LOS: 1 day        Huey Bienenstock, MD [facility]   To contact the attending provider between 7A-7P or the covering provider during after hours 7P-7A, please log into the web site [email or web address] and access using universal [place] password for that web site. If you do not have the password, please call the hospital operator.  12/29/2022, 2:46 PM

## 2022-12-29 NOTE — Plan of Care (Signed)
  Problem: Education: Goal: Knowledge of General Education information will improve Description: Including pain rating scale, medication(s)/side effects and non-pharmacologic comfort measures 12/29/2022 1657 by Nyoka Lint, RN Outcome: Progressing 12/29/2022 1657 by Nyoka Lint, RN Outcome: Progressing   Problem: Health Behavior/Discharge Planning: Goal: Ability to manage health-related needs will improve 12/29/2022 1657 by Nyoka Lint, RN Outcome: Progressing 12/29/2022 1657 by Nyoka Lint, RN Outcome: Progressing   Problem: Clinical Measurements: Goal: Ability to maintain clinical measurements within normal limits will improve 12/29/2022 1657 by Nyoka Lint, RN Outcome: Progressing 12/29/2022 1657 by Nyoka Lint, RN Outcome: Progressing Goal: Will remain free from infection 12/29/2022 1657 by Nyoka Lint, RN Outcome: Progressing 12/29/2022 1657 by Nyoka Lint, RN Outcome: Progressing Goal: Diagnostic test results will improve 12/29/2022 1657 by Nyoka Lint, RN Outcome: Progressing 12/29/2022 1657 by Nyoka Lint, RN Outcome: Progressing Goal: Respiratory complications will improve 12/29/2022 1657 by Nyoka Lint, RN Outcome: Progressing 12/29/2022 1657 by Nyoka Lint, RN Outcome: Progressing Goal: Cardiovascular complication will be avoided 12/29/2022 1657 by Nyoka Lint, RN Outcome: Progressing 12/29/2022 1657 by Nyoka Lint, RN Outcome: Progressing   Problem: Activity: Goal: Risk for activity intolerance will decrease 12/29/2022 1657 by Nyoka Lint, RN Outcome: Progressing 12/29/2022 1657 by Nyoka Lint, RN Outcome: Progressing   Problem: Nutrition: Goal: Adequate nutrition will be maintained 12/29/2022 1657 by Nyoka Lint, RN Outcome: Progressing 12/29/2022 1657 by Nyoka Lint, RN Outcome: Progressing   Problem: Coping: Goal: Level of anxiety will decrease 12/29/2022 1657 by Nyoka Lint, RN Outcome:  Progressing 12/29/2022 1657 by Nyoka Lint, RN Outcome: Progressing   Problem: Elimination: Goal: Will not experience complications related to bowel motility 12/29/2022 1657 by Nyoka Lint, RN Outcome: Progressing 12/29/2022 1657 by Nyoka Lint, RN Outcome: Progressing Goal: Will not experience complications related to urinary retention 12/29/2022 1657 by Nyoka Lint, RN Outcome: Progressing 12/29/2022 1657 by Nyoka Lint, RN Outcome: Progressing   Problem: Pain Management: Goal: General experience of comfort will improve 12/29/2022 1657 by Nyoka Lint, RN Outcome: Progressing 12/29/2022 1657 by Nyoka Lint, RN Outcome: Progressing   Problem: Safety: Goal: Ability to remain free from injury will improve 12/29/2022 1657 by Nyoka Lint, RN Outcome: Progressing 12/29/2022 1657 by Nyoka Lint, RN Outcome: Progressing   Problem: Skin Integrity: Goal: Risk for impaired skin integrity will decrease 12/29/2022 1657 by Nyoka Lint, RN Outcome: Progressing 12/29/2022 1657 by Nyoka Lint, RN Outcome: Progressing

## 2022-12-29 NOTE — Progress Notes (Signed)
  Echocardiogram 2D Echocardiogram has been performed.  Casimiro Needle P Dajiah Kooi 12/29/2022, 1:25 PM

## 2022-12-30 ENCOUNTER — Inpatient Hospital Stay (HOSPITAL_COMMUNITY): Payer: Medicare Other

## 2022-12-30 DIAGNOSIS — B0052 Herpesviral keratitis: Secondary | ICD-10-CM | POA: Diagnosis not present

## 2022-12-30 DIAGNOSIS — J439 Emphysema, unspecified: Secondary | ICD-10-CM | POA: Diagnosis not present

## 2022-12-30 DIAGNOSIS — N280 Ischemia and infarction of kidney: Secondary | ICD-10-CM | POA: Diagnosis not present

## 2022-12-30 DIAGNOSIS — I739 Peripheral vascular disease, unspecified: Secondary | ICD-10-CM | POA: Diagnosis not present

## 2022-12-30 LAB — BASIC METABOLIC PANEL
Anion gap: 9 (ref 5–15)
BUN: 11 mg/dL (ref 8–23)
CO2: 25 mmol/L (ref 22–32)
Calcium: 7.9 mg/dL — ABNORMAL LOW (ref 8.9–10.3)
Chloride: 100 mmol/L (ref 98–111)
Creatinine, Ser: 1.16 mg/dL — ABNORMAL HIGH (ref 0.44–1.00)
GFR, Estimated: 48 mL/min — ABNORMAL LOW (ref >60)
Glucose, Bld: 142 mg/dL — ABNORMAL HIGH (ref 70–99)
Potassium: 3.6 mmol/L (ref 3.5–5.1)
Sodium: 134 mmol/L — ABNORMAL LOW (ref 135–145)

## 2022-12-30 LAB — CBC
HCT: 37.5 % (ref 36.0–46.0)
Hemoglobin: 11.5 g/dL — ABNORMAL LOW (ref 12.0–15.0)
MCH: 28.5 pg (ref 26.0–34.0)
MCHC: 30.7 g/dL (ref 30.0–36.0)
MCV: 93.1 fL (ref 80.0–100.0)
Platelets: 139 10*3/uL — ABNORMAL LOW (ref 150–400)
RBC: 4.03 MIL/uL (ref 3.87–5.11)
RDW: 15.8 % — ABNORMAL HIGH (ref 11.5–15.5)
WBC: 11.1 10*3/uL — ABNORMAL HIGH (ref 4.0–10.5)
nRBC: 0 % (ref 0.0–0.2)

## 2022-12-30 LAB — URINALYSIS, W/ REFLEX TO CULTURE (INFECTION SUSPECTED)
Bilirubin Urine: NEGATIVE
Glucose, UA: NEGATIVE mg/dL
Ketones, ur: NEGATIVE mg/dL
Leukocytes,Ua: NEGATIVE
Nitrite: NEGATIVE
Protein, ur: 100 mg/dL — AB
Specific Gravity, Urine: 1.012 (ref 1.005–1.030)
pH: 5 (ref 5.0–8.0)

## 2022-12-30 LAB — HEPATIC FUNCTION PANEL
ALT: 80 U/L — ABNORMAL HIGH (ref 0–44)
AST: 48 U/L — ABNORMAL HIGH (ref 15–41)
Albumin: 2.8 g/dL — ABNORMAL LOW (ref 3.5–5.0)
Alkaline Phosphatase: 87 U/L (ref 38–126)
Bilirubin, Direct: 0.4 mg/dL — ABNORMAL HIGH (ref 0.0–0.2)
Indirect Bilirubin: 0.8 mg/dL (ref 0.3–0.9)
Total Bilirubin: 1.2 mg/dL (ref 0.3–1.2)
Total Protein: 6.4 g/dL — ABNORMAL LOW (ref 6.5–8.1)

## 2022-12-30 LAB — PROCALCITONIN: Procalcitonin: 0.45 ng/mL

## 2022-12-30 LAB — BRAIN NATRIURETIC PEPTIDE
B Natriuretic Peptide: 125 pg/mL — ABNORMAL HIGH (ref 0.0–100.0)
B Natriuretic Peptide: 134.3 pg/mL — ABNORMAL HIGH (ref 0.0–100.0)

## 2022-12-30 LAB — SEDIMENTATION RATE: Sed Rate: 30 mm/h — ABNORMAL HIGH (ref 0–22)

## 2022-12-30 LAB — HEPARIN LEVEL (UNFRACTIONATED)
Heparin Unfractionated: 0.22 [IU]/mL — ABNORMAL LOW (ref 0.30–0.70)
Heparin Unfractionated: 0.31 [IU]/mL (ref 0.30–0.70)

## 2022-12-30 LAB — C-REACTIVE PROTEIN: CRP: 20.2 mg/dL — ABNORMAL HIGH (ref <1.0)

## 2022-12-30 MED ORDER — GANCICLOVIR 0.15 % OP GEL: 1.0000 [drp] | Freq: Every day | OPHTHALMIC | Status: DC

## 2022-12-30 MED ORDER — METHYLPREDNISOLONE SODIUM SUCC 125 MG IJ SOLR
125.0000 mg | Freq: Once | INTRAMUSCULAR | Status: AC
  Administered 2022-12-30: 125 mg via INTRAVENOUS
  Filled 2022-12-30: qty 2

## 2022-12-30 MED ORDER — POLYETHYLENE GLYCOL 3350 17 G PO PACK
17.0000 g | PACK | Freq: Four times a day (QID) | ORAL | Status: AC
  Administered 2022-12-30 (×2): 17 g via ORAL
  Filled 2022-12-30 (×2): qty 1

## 2022-12-30 MED ORDER — IPRATROPIUM-ALBUTEROL 0.5-2.5 (3) MG/3ML IN SOLN
3.0000 mL | RESPIRATORY_TRACT | Status: DC
  Administered 2022-12-30 – 2022-12-31 (×6): 3 mL via RESPIRATORY_TRACT
  Filled 2022-12-30 (×6): qty 3

## 2022-12-30 MED ORDER — OFLOXACIN 0.3 % OP SOLN
1.0000 [drp] | Freq: Four times a day (QID) | OPHTHALMIC | Status: DC
  Administered 2022-12-31 – 2023-01-02 (×9): 1 [drp] via OPHTHALMIC
  Filled 2022-12-30 (×3): qty 5

## 2022-12-30 MED ORDER — MIDODRINE HCL 5 MG PO TABS
5.0000 mg | ORAL_TABLET | Freq: Two times a day (BID) | ORAL | Status: DC
  Administered 2022-12-30 – 2022-12-31 (×3): 5 mg via ORAL
  Filled 2022-12-30 (×4): qty 1

## 2022-12-30 MED ORDER — VALACYCLOVIR HCL 500 MG PO TABS
1000.0000 mg | ORAL_TABLET | Freq: Two times a day (BID) | ORAL | Status: DC
  Administered 2022-12-30 – 2023-01-02 (×6): 1000 mg via ORAL
  Filled 2022-12-30 (×6): qty 2

## 2022-12-30 NOTE — Progress Notes (Signed)
PHARMACY - ANTICOAGULATION CONSULT NOTE  Pharmacy Consult for Heparin Indication: renal artery thromboembolism  Allergies  Allergen Reactions   Brilinta [Ticagrelor] Shortness Of Breath   Penicillins Anaphylaxis    Has patient had a PCN reaction causing immediate rash, facial/tongue/throat swelling, SOB or lightheadedness with hypotension: Yes Has patient had a PCN reaction causing severe rash involving mucus membranes or skin necrosis: yes Has patient had a PCN reaction that required hospitalization: no Has patient had a PCN reaction occurring within the last 10 years: no If all of the above answers are "NO", then may proceed with Cephalosporin use.    Ranexa [Ranolazine] Shortness Of Breath   Sulfa Antibiotics Anaphylaxis and Rash    Blistering rash on hands and feet   Crestor [Rosuvastatin] Other (See Comments)    Myalgias    Patient Measurements: Height: 5\' 5"  (165.1 cm) Weight: 84.7 kg (186 lb 11.7 oz) IBW/kg (Calculated) : 57 Heparin Dosing Weight: 75.3 kg  Vital Signs: Temp: 97.5 F (36.4 C) (11/01 2043) Temp Source: Oral (11/01 2043) BP: 117/58 (11/01 2043) Pulse Rate: 75 (11/01 1815)  Labs: Recent Labs    12/29/22 0606 12/29/22 1936 12/30/22 0320 12/30/22 2044  HGB 13.5  --  11.5*  --   HCT 43.3  --  37.5  --   PLT 163  --  139*  --   HEPARINUNFRC 0.28* 0.31 0.22* 0.31  CREATININE 1.09*  --  1.16*  --     Estimated Creatinine Clearance: 42.3 mL/min (A) (by C-G formula based on SCr of 1.16 mg/dL (H)).   Medical History: Past Medical History:  Diagnosis Date   Asthma    COPD (chronic obstructive pulmonary disease) (HCC)    Coronary artery disease    Cough    " ALL MY LIFE "   Dyspnea    GERD (gastroesophageal reflux disease)    Hypertension    NSTEMI (non-ST elevated myocardial infarction) (HCC) 09/01/2016   DES to Cx/OM bifurcation   Unstable angina (HCC) 08/2016    Medications:  Scheduled:   aspirin EC  81 mg Oral Daily   docusate sodium   100 mg Oral Daily   fluticasone furoate-vilanterol  1 puff Inhalation Daily   Ganciclovir  1 drop Right Eye 5 X Daily   ipratropium-albuterol  3 mL Nebulization Q4H   midodrine  5 mg Oral BID WC   ofloxacin  1 drop Right Eye QID   pantoprazole  40 mg Oral Daily   polyethylene glycol  17 g Oral Q6H   umeclidinium bromide  1 puff Inhalation Daily   valACYclovir  1,000 mg Oral BID   Infusions:   heparin 1,400 Units/hr (12/30/22 1800)   PRN: acetaminophen **OR** acetaminophen, HYDROmorphone (DILAUDID) injection, ipratropium-albuterol, ondansetron **OR** ondansetron (ZOFRAN) IV, oxyCODONE, sorbitol  Assessment: 77 yo female transferred from [facility] on a heparin drip for renal artery thromboembolism.  Patient not on anticoagulants PTA. BMI 31.  Heparin level came back therapeutic but on the lower side this PM. We will increase slightly and check in AM.   Copay check for 30 day supply: Eliquis copay is $4.60, Xarelto copay is $4.60, Warfarin 5 mg copay is $0.00  Goal of Therapy:  Heparin level 0.3-0.7 units/ml Monitor platelets by anticoagulation protocol: Yes   Plan:  Increase Heparin infusion to 1450 units/hr  Check AM heparin level  Daily HL and CBC Monitor for signs/symptoms of bleeding   Ulyses Southward, PharmD, BCIDP, AAHIVP, CPP Infectious Disease Pharmacist 12/30/2022 9:28 PM

## 2022-12-30 NOTE — Progress Notes (Signed)
Inpatient Rehab Admissions Coordinator:   Per PT recommendation pt was screened for CIR by Estill Dooms, PT, DPT.  Pt admitted with renal infarct, AKI now resolving.  Mobilizing with CGA, but limited by pain and decreased O2 saturation 2/2 pain with inspiration.  O2 at 3L with PT, now documented at 4L.  I will not place a consult today, but will f/u on Monday if she remains admitted and rescreen at that time.   Estill Dooms, PT, DPT Admissions Coordinator [phone] 12/30/22  1:23 PM

## 2022-12-30 NOTE — Progress Notes (Signed)
Came to room for flutter valve.  Pt is off the floor now, flutter valve placed on computer and RN states he will give the flutter valve

## 2022-12-30 NOTE — Evaluation (Signed)
Physical Therapy Evaluation Patient Details Name: Lisa [name] MRN: [id number] DOB: [birth year] Today's Date: 12/30/2022  History of Present Illness  Pt is a 77 y/o F admitted on 12/28/22 after presenting with c/o acute onset of abdominal pain. 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 PMH: COPD, tobacco abuse, HTN, CAD  Clinical Impression  Pt seen for PT evaluation with pt agreeable, daughter [name]) present for session. Prior to admission pt was independent without AD, driving, denies falls, living with daughter (she is disabled & pt assists her with IADLs). On this date, pt is limited by pain in L flank. Pt is able to complete supine>sit with supervision, extra time, but relies on HOB elevated, bed rails. Pt is able to complete STS with CGA, extra time & ambulate around bed to [facility] & back with RW & CGA. Pt on 3L/min with SpO2 dropping as low as 87% with activity but increases with rest; pt unable to take deep inhalation 2/2 pain. Reviewed use of incentive spirometer with pt. Pt would benefit from post acute rehab >3 hours therapy/day to maximize independence & facilitate return to independent PLOF.        If plan is discharge home, recommend the following: A little help with walking and/or transfers;A little help with bathing/dressing/bathroom;Assistance with cooking/housework;Assist for transportation;Help with stairs or ramp for entrance   Can travel by private vehicle        Equipment Recommendations Rolling walker (2 wheels);BSC/3in1  Recommendations for Other Services    OT consult   Functional Status Assessment Patient has had a recent decline in their functional status and demonstrates the ability to make significant improvements in function in a reasonable and predictable amount of time.     Precautions / Restrictions Precautions Precautions: Fall Restrictions Weight Bearing Restrictions: No      Mobility  Bed  Mobility Overal bed mobility: Needs Assistance Bed Mobility: Supine to Sit     Supine to sit: Supervision, HOB elevated, Used rails     General bed mobility comments: extra time    Transfers Overall transfer level: Needs assistance Equipment used: Rolling walker (2 wheels) Transfers: Sit to/from Stand, Bed to chair/wheelchair/BSC Sit to Stand: Contact guard assist   Step pivot transfers: Contact guard assist (with RW)       General transfer comment: STS from EOB, recliner, BSC, extra time to power up to standing    Ambulation/Gait Ambulation/Gait assistance: Contact guard assist Gait Distance (Feet):  (10 + 10 ft) Assistive device: Rolling walker (2 wheels) Gait Pattern/deviations: Decreased step length - right, Decreased step length - left, Decreased dorsiflexion - left, Decreased dorsiflexion - right, Decreased stride length, Shuffle Gait velocity: decreased     General Gait Details: decreased foot clearance BLE, educated pt on need to ambulate within base of AD but pt still pushing it slightly out in front of her  Stairs            Wheelchair Mobility     Tilt Bed    Modified Rankin (Stroke Patients Only)       Balance Overall balance assessment: Needs assistance Sitting-balance support: Feet supported Sitting balance-Leahy Scale: Fair     Standing balance support: During functional activity, No upper extremity supported Standing balance-Leahy Scale: Poor                               Pertinent Vitals/Pain Pain Assessment Pain  Assessment: 0-10 Pain Score: 4  Pain Location: L flank Pain Descriptors / Indicators: Discomfort Pain Intervention(s): Monitored during session    Home Living Family/patient expects to be discharged to:: Private residence Living Arrangements: Children Available Help at Discharge: Family;Available 24 hours/day Type of Home: House Home Access: Stairs to enter Entrance Stairs-Rails: Right Entrance  Stairs-Number of Steps: 2 at back door   Home Layout: One level 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: Standard Walker Additional Comments: Lives with disabled daughter (assists daughter with IADLs)    Prior Function Prior Level of Function : Independent/Modified Independent;Driving             Mobility Comments: independent without AD, denies falls ADLs Comments: Independent     Extremity/Trunk Assessment   Upper Extremity Assessment Upper Extremity Assessment: Generalized weakness    Lower Extremity Assessment Lower Extremity Assessment: Generalized weakness       Communication   Communication Communication: No apparent difficulties  Cognition Arousal: Alert Behavior During Therapy: WFL for tasks assessed/performed Overall Cognitive Status: Within Functional Limits for tasks assessed                                          General Comments General comments (skin integrity, edema, etc.): Small continent void on [facility]    Exercises     Assessment/Plan    PT Assessment Patient needs continued PT services  PT Problem List Decreased strength;Cardiopulmonary status limiting activity;Pain;Decreased range of motion;Decreased activity tolerance;Decreased balance;Decreased mobility;Decreased knowledge of use of DME       PT Treatment Interventions DME instruction;Balance training;Modalities;Gait training;Neuromuscular re-education;Stair training;Functional mobility training;Patient/family education;Therapeutic exercise;Manual techniques;Therapeutic activities    PT Goals (Current goals can be found in the Care Plan section)  Acute Rehab PT Goals Patient Stated Goal: get better, decreased pain PT Goal Formulation: With patient/family Time For Goal Achievement: 01/13/23 Potential to Achieve Goals: Good    Frequency Min 1X/week     Co-evaluation               AM-PAC PT "6 Clicks" Mobility  Outcome Measure Help needed turning from your back to your side while in a  flat bed without using bedrails?: None Help needed moving from lying on your back to sitting on the side of a flat bed without using bedrails?: A Little Help needed moving to and from a bed to a chair (including a wheelchair)?: A Little Help needed standing up from a chair using your arms (e.g., wheelchair or bedside chair)?: A Little Help needed to walk in hospital room?: A Little Help needed climbing 3-5 steps with a railing? : A Lot 6 Click Score: 18    End of Session Equipment Utilized During Treatment: Oxygen Activity Tolerance: Patient tolerated treatment well;Patient limited by fatigue;Patient limited by pain Patient left: in chair;with chair alarm set;with call bell/phone within reach;with family/visitor present   PT Visit Diagnosis: Difficulty in walking, not elsewhere classified (R26.2);Muscle weakness (generalized) (M62.81);Pain;Other abnormalities of gait and mobility (R26.89) Pain - Right/Left: Left Pain - part of body:  (flank)    Time: 1610-9604 PT Time Calculation (min) (ACUTE ONLY): 33 min   Charges:   PT Evaluation $PT Eval Low Complexity: 1 Low PT Treatments $Therapeutic Activity: 8-22 mins PT General Charges $$ ACUTE PT VISIT: 1 Visit         Aleda Grana, PT, DPT 12/30/22, 10:09 AM   Sandi Mariscal 12/30/2022, 10:07 AM

## 2022-12-30 NOTE — Progress Notes (Signed)
PHARMACY - ANTICOAGULATION CONSULT NOTE  Pharmacy Consult for Heparin Indication: renal artery thromboembolism  Allergies  Allergen Reactions   Brilinta [Ticagrelor] Shortness Of Breath   Penicillins Anaphylaxis    Has patient had a PCN reaction causing immediate rash, facial/tongue/throat swelling, SOB or lightheadedness with hypotension: Yes Has patient had a PCN reaction causing severe rash involving mucus membranes or skin necrosis: yes Has patient had a PCN reaction that required hospitalization: no Has patient had a PCN reaction occurring within the last 10 years: no If all of the above answers are "NO", then may proceed with Cephalosporin use.    Ranexa [Ranolazine] Shortness Of Breath   Sulfa Antibiotics Anaphylaxis and Rash    Blistering rash on hands and feet   Crestor [Rosuvastatin] Other (See Comments)    Myalgias    Patient Measurements: Height: 5\' 5"  (165.1 cm) Weight: 84.7 kg (186 lb 11.7 oz) IBW/kg (Calculated) : 57 Heparin Dosing Weight: 75.3 kg  Vital Signs: Temp: 98.8 F (37.1 C) (11/01 0820) Temp Source: Oral (11/01 0820) BP: 108/51 (11/01 0820) Pulse Rate: 77 (11/01 0820)  Labs: Recent Labs    12/29/22 0606 12/29/22 1936 12/30/22 0320  HGB 13.5  --  11.5*  HCT 43.3  --  37.5  PLT 163  --  139*  HEPARINUNFRC 0.28* 0.31 0.22*  CREATININE 1.09*  --  1.16*    Estimated Creatinine Clearance: 42.3 mL/min (A) (by C-G formula based on SCr of 1.16 mg/dL (H)).   Medical History: Past Medical History:  Diagnosis Date   Asthma    COPD (chronic obstructive pulmonary disease) (HCC)    Coronary artery disease    Cough    " ALL MY LIFE "   Dyspnea    GERD (gastroesophageal reflux disease)    Hypertension    NSTEMI (non-ST elevated myocardial infarction) (HCC) 09/01/2016   DES to Cx/OM bifurcation   Unstable angina (HCC) 08/2016    Medications:  Scheduled:   aspirin EC  81 mg Oral Daily   docusate sodium  100 mg Oral Daily   fluticasone  furoate-vilanterol  1 puff Inhalation Daily   ipratropium-albuterol  3 mL Nebulization Q4H   metoprolol succinate  25 mg Oral Daily   pantoprazole  40 mg Oral Daily   umeclidinium bromide  1 puff Inhalation Daily   Infusions:   dextrose 5 % and 0.45 % NaCl 75 mL/hr at 12/30/22 0900   heparin 1,250 Units/hr (12/30/22 0900)   PRN: acetaminophen **OR** acetaminophen, HYDROmorphone (DILAUDID) injection, ipratropium-albuterol, ondansetron **OR** ondansetron (ZOFRAN) IV, oxyCODONE, sorbitol  Assessment: 77 yo female transferred from [facility] on a heparin drip for renal artery thromboembolism.  Patient not on anticoagulants PTA. BMI 31.  11/1 AM: heparin level 0.22 dropped to subtherapeutic on heparin 1250 units/hr. No issues with the infusion or bleeding reported. Hgb 13.5> 11.5, pltc 163>139k.  Copay check for 30 day supply: Eliquis copay is $4.60, Xarelto copay is $4.60, Warfarin 5 mg copay is $0.00  Goal of Therapy:  Heparin level 0.3-0.7 units/ml Monitor platelets by anticoagulation protocol: Yes   Plan:  Increase Heparin infusion to 1400 units/hr  Check 8 hour heparin level  Daily HL and CBC Monitor for signs/symptoms of bleeding   Thank you for allowing pharmacy to be part of this patients care team. Noah Delaine, RPh Clinical Pharmacist  Please check AMION for all [facility] Pharmacy phone numbers After 10:00 PM, call Main Pharmacy [phone] 12/30/2022 11:03 AM

## 2022-12-30 NOTE — Consult Note (Signed)
OPHTHALMOLOGY CONSULT NOTE   HPI: 77 yo F w/ significant PMH, admitted for renal infarct. Pt developed right eye pain and blurry vision that started on Wednesday. Ophthalmology consulted to evaluate.  Pt reports eye pain OD, but denies foreign body sensation. No photophobia. Pt reports history of shingles of right cheek some 20 years ago.  OHx: s/p cataract surgery OU  ORx: none  No current facility-administered medications on file prior to encounter.   Current Outpatient Medications on File Prior to Encounter  Medication Sig Dispense Refill   acetaminophen (TYLENOL) 500 MG tablet Take 1,000 mg by mouth every 6 (six) hours as needed for moderate pain.     aspirin EC 81 MG EC tablet Take 1 tablet (81 mg total) by mouth daily. 90 tablet 3   Cholecalciferol (VITAMIN D3) 125 MCG (5000 UT) CAPS Take 5,000 Units by mouth daily.     Coenzyme Q10 (COQ10) 100 MG CAPS Take 100 mg by mouth daily.     Cyanocobalamin (VITAMIN B-12 PO) Take 1 tablet by mouth daily.     docusate sodium (COLACE) 100 MG capsule Take 100 mg by mouth daily.     esomeprazole (NEXIUM) 20 MG capsule Take 20 mg by mouth daily at 12 noon.     fluocinonide ointment (LIDEX) 0.05 % Apply 1 Application topically 2 (two) times daily.     fluticasone-salmeterol (ADVAIR HFA) 115-21 MCG/ACT inhaler Inhale 2 puffs into the lungs at bedtime.     furosemide (LASIX) 20 MG tablet Take 20 mg by mouth daily as needed.     Glucosamine-Chondroitin (MOVE FREE PO) Take 1 tablet by mouth daily.     lisinopril (ZESTRIL) 20 MG tablet Take 20 mg by mouth daily.     metoprolol succinate (TOPROL XL) 25 MG 24 hr tablet Take 0.5 tablets (12.5 mg total) by mouth 2 (two) times daily. (Patient taking differently: Take 25 mg by mouth daily.) 30 tablet 11   nitroGLYCERIN (NITROSTAT) 0.4 MG SL tablet Place 1 tablet (0.4 mg total) under the tongue every 5 (five) minutes as needed for chest pain. 25 tablet 1   potassium chloride (KLOR-CON M) 10 MEQ tablet Take  10 mEq by mouth as needed.     Probiotic Product (PROBIOTIC PO) Take 1 capsule by mouth daily.     diazepam (VALIUM) 2 MG tablet Take 2 mg by mouth daily as needed for anxiety.      Past Medical History:  Diagnosis Date   Asthma    COPD (chronic obstructive pulmonary disease) (HCC)    Coronary artery disease    Cough    " ALL MY LIFE "   Dyspnea    GERD (gastroesophageal reflux disease)    Hypertension    NSTEMI (non-ST elevated myocardial infarction) (HCC) 09/01/2016   DES to Cx/OM bifurcation   Unstable angina (HCC) 08/2016    family history includes Asthma in her mother; Heart attack (age of onset: 60) in her father; Heart attack (age of onset: 64) in her son; Prostate cancer in her brother.  Social History   Occupational History   Not on file  Tobacco Use   Smoking status: Every Day    Current packs/day: 1.00    Average packs/day: 1 pack/day for 50.0 years (50.0 ttl pk-yrs)    Types: Cigarettes   Smokeless tobacco: Never  Vaping Use   Vaping status: Never Used  Substance and Sexual Activity   Alcohol use: Not Currently    Comment: occasional   Drug use:  No   Sexual activity: Yes    Allergies  Allergen Reactions   Brilinta [Ticagrelor] Shortness Of Breath   Penicillins Anaphylaxis    Has patient had a PCN reaction causing immediate rash, facial/tongue/throat swelling, SOB or lightheadedness with hypotension: Yes Has patient had a PCN reaction causing severe rash involving mucus membranes or skin necrosis: yes Has patient had a PCN reaction that required hospitalization: no Has patient had a PCN reaction occurring within the last 10 years: no If all of the above answers are "NO", then may proceed with Cephalosporin use.    Ranexa [Ranolazine] Shortness Of Breath   Sulfa Antibiotics Anaphylaxis and Rash    Blistering rash on hands and feet   Crestor [Rosuvastatin] Other (See Comments)    Myalgias    EXAM  Mental Status: A&Ox3   Base Exam  OD  OS   VA  (near card)  20/20 (slow) 20/20  Pupils  Round; reactive; 3-28mm; no rAPD Round; reactive; 3-70mm; no rAPD  IOP  17 mmHg 18 mmHg  Motility  Full Full  External  Normal; no skin lesions Normal    Anterior Exam  OD  OS   Lids / Lashes  Normal Normal  Conj / Sclera Normal  Normal  Cornea  Punctate fluorescein staining -- small dendrites Clear; no fluorescein uptake  Ant Chamber  Deep Deep  Iris Normal Normal  Lens PCIOL PCIOL    Posterior Exam  OD  OS   Vitreous   Clear  Clear  Disc  Pink and sharp; c/d 0.6  Pink and sharp; c/d 0.6   Macula  Flat; good foveal reflex; no heme or edema Flat; good foveal reflex; no heme or edema  Vessels  Mild attenuation; mild tortuosity Mild attenuation; mild tortuosity  Periphery  Attached; no heme Attached; no heme   Assessment/Plan:  1. Herpes simplex dendritic keratitis OD  - small, early dendritic lesions w/ positive fluorescein uptake -- central cornea  - pt reports history of herpetic disease on right cheek some 20 yrs ago  - no periocular skin vesicles/lesions noted today  - BCVA OD remains good at 20/20, but subjectively not as sharp as OS  - recommend topical ganciclovir 0.15% ophthalmic gel, 5x/day, OD  - recommend ofloxacin ophthalmic soln, QID OD  - pt may benefit from oral valacyclovir 500mg  TID, but will defer to primary team, due to possible nephrotoxicity  - will follow up in 1 wk - - if pt is discharged, please call my office to set up outpt follow up   [facility]   [phone]  2. Hypertensive retinopathy OU - discussed importance of tight BP control - monitor   3. Pseudophakia OU  - [place]  - IOLs in good position  - monitor  Karie Chimera, M.D., Ph.D. Diseases & Surgery of the Retina and Vitreous [facility]

## 2022-12-30 NOTE — Progress Notes (Signed)
[facility]  Subjective  -patient in CT scanner this morning   Objective (!) 108/51 77 98.8 F (37.1 C) (Oral) 11 92%  Intake/Output Summary (Last 24 hours) at 12/30/2022 1202 Last data filed at 12/30/2022 0900 Gross per 24 hour  Intake 1932.28 ml  Output --  Net 1932.28 ml      Laboratory Lab Results: Recent Labs    12/29/22 0606 12/30/22 0320  WBC 8.5 11.1*  HGB 13.5 11.5*  HCT 43.3 37.5  PLT 163 139*   BMET Recent Labs    12/29/22 0606 12/30/22 0320  NA 135 134*  K 4.0 3.6  CL 100 100  CO2 27 25  GLUCOSE 139* 142*  BUN 10 11  CREATININE 1.09* 1.16*  CALCIUM 8.7* 7.9*    COAG Lab Results  Component Value Date   INR 1.07 09/01/2016   No results found for: "PTT"  Assessment/Planning:  77 year old female seen with 50% infarct of the left kidney.  Echocardiogram negative for any atheroembolic source and CTA chest earlier this year did not show any cardioembolic source.  Would be reasonable to consider DOAC until she has completed her cardioembolic workup and would need outpatient loop recorder.  Discussed with family if this was all negative can probably do just aspirin and high-dose statin as she does have some plaque at the ostium of the left renal artery that may have been the source.  Duplex shows the left renal artery is patent with no flow-limiting stenosis and there is no indication for percutaneous intervention.  I will arrange follow-up with me in 1 month.  Cephus Shelling 12/30/2022 12:02 PM --

## 2022-12-30 NOTE — Progress Notes (Signed)
Pt dx with HSV dendritic keratitis. Rx asked to guide Valtrex dosing for renal function.  Crcl 30-50>>Valtrex 1g PO BID x7 days  Ulyses Southward, PharmD, [place], AAHIVP, CPP Infectious Disease Pharmacist 12/30/2022 6:40 PM

## 2022-12-30 NOTE — Progress Notes (Signed)
PROGRESS NOTE    Lisa [name]  WGN:[id number] DOB: 12-19-1945 DOA: 12/28/2022 PCP: Kirstie Peri, MD   No chief complaint on file.   Brief Narrative:   Lisa [name] is a 77 y.o. female with medical history significant for COPD, tobacco abuse, hypertension, and coronary artery disease with history of drug-eluting stent 6 years ago.  She developed acute onset abdominal pain at approximately 4:00 this morning.  She was up having some coffee when the pain started.  She moved around and did everything she could but the pain just got worse as the hours went by.  Eventually she presented to an outside emergency department.  Her workup included a CT scan of the abdomen pelvis which revealed the renal infarct which involved greater than 50% of her left kidney.  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  The patient was started on IV heparin and was transferred here.  She continues to have pain but denies fevers or chills or chest pain or nausea or vomiting or diarrhea.   Assessment & Plan:   Principal Problem:   Renal infarction [facility]) Active Problems:   COPD (chronic obstructive pulmonary disease) (HCC)  Left renal infarct  -This most likely due to thromboembolic/atheroembolic event, no clear etiology so far, continue with heparin GTT over the next 24 hours, likely will transition to DOAC as an outpatient for 31-month discussed with vascular surgery -echo with no acute finding or concern for thrombosis  -Likely she will need 30 days event monitor as an outpatient to evaluate for source of thrombosis -As discussed with vascular surgery, renal duplex confirmed that left renal artery is patent without flow-limiting stenosis. -He does report some lower extremity pain started overnight, I will check ABI as discussed with vascular surgery  AKI -Creatinine is up this morning 0.65-1.16 -continue with IV fluids  COPD -will start on scheduled DuoNebs, will give 1 dose of steroids given some  wheezing and oxygen requirement  Hypertension -I will hold her metoprolol given overall her blood pressure remains soft  -As well continue to hold her ACE inhibitor in the setting of worsening creatinine   Acute on chronic diastolic CHF Acute hypoxic respiratory failure -Patient requiring 3 L oxygen, upon discontinuing it she drops to 85%, she is with evidence of volume overload on imaging -As well she is with underlying diagnosis of COPD, she is with minimal wheezing, so we will give 1 dose of steroids, continue with scheduled nebulizer treatment. -CT chest without contrast is pending  Right eye pain - Patient reports right eye sharp pain, developed overnight, and some blurry vision, she denies any headache, sudden vision loss, or jaw claudication, she is afebrile. -Ophthalmology input greatly appreciated, Dr. Vanessa Barbara will evaluate her this evening for further recommendations.  Transaminitis -continue to trend, improving.      DVT prophylaxis: Heparin GTT Code Status: full Family Communication: D/W daughter at bedside Disposition:   Status is: Inpatient    Consultants:  Vascular Surgery   Subjective:  No nausea or vomiting, she still reports left abdomen-flank pain, she does report right high blurry vision and sharp pain developed overnight, no headache, no jaw claudication.  Objective: Vitals:   12/30/22 0817 12/30/22 0820 12/30/22 1201 12/30/22 1345  BP: (!) 108/51 (!) 108/51  (!) 100/52  Pulse: 79 77  76  Resp:      Temp:  98.8 F (37.1 C)  98.3 F (36.8 C)  TempSrc:  Oral    SpO2:  92% 92% 91%  Weight:  Height:        Intake/Output Summary (Last 24 hours) at 12/30/2022 1520 Last data filed at 12/30/2022 0900 Gross per 24 hour  Intake 1932.28 ml  Output --  Net 1932.28 ml   Filed Weights   12/28/22 1956  Weight: 84.7 kg    Examination:  Awake Alert, Oriented X 3, frail Symmetrical Chest wall movement, Good air movement bilaterally,  CTAB RRR,No Gallops,Rubs or new Murmurs, No Parasternal Heave +ve B.Sounds, Abd Soft, No tenderness, No rebound - guarding or rigidity. No Cyanosis, Clubbing or edema     Data Reviewed: I have personally reviewed following labs and imaging studies  CBC: Recent Labs  Lab 12/29/22 0606 12/30/22 0320  WBC 8.5 11.1*  HGB 13.5 11.5*  HCT 43.3 37.5  MCV 93.3 93.1  PLT 163 139*    Basic Metabolic Panel: Recent Labs  Lab 12/29/22 0606 12/30/22 0320  NA 135 134*  K 4.0 3.6  CL 100 100  CO2 27 25  GLUCOSE 139* 142*  BUN 10 11  CREATININE 1.09* 1.16*  CALCIUM 8.7* 7.9*    GFR: Estimated Creatinine Clearance: 42.3 mL/min (A) (by C-G formula based on SCr of 1.16 mg/dL (H)).  Liver Function Tests: Recent Labs  Lab 12/29/22 0606 12/30/22 1016  AST 131* 48*  ALT 114* 80*  ALKPHOS 103 87  BILITOT 0.9 1.2  PROT 6.3* 6.4*  ALBUMIN 3.0* 2.8*    CBG: No results for input(s): "GLUCAP" in the last 168 hours.   No results found for this or any previous visit (from the past 240 hour(s)).       Radiology Studies: CT CHEST WO CONTRAST  Result Date: 12/30/2022 CLINICAL DATA:  Abnormal x-ray with lung opacity. Hypoxia. Rales in the lung base. EXAM: CT CHEST WITHOUT CONTRAST TECHNIQUE: Multidetector CT imaging of the chest was performed following the standard protocol without IV contrast. RADIATION DOSE REDUCTION: This exam was performed according to the departmental dose-optimization program which includes automated exposure control, adjustment of the mA and/or kV according to patient size and/or use of iterative reconstruction technique. COMPARISON:  Chest radiograph 12/30/2022.  CT chest 07/22/2022 FINDINGS: Cardiovascular: Normal heart size. No pericardial effusions. Normal caliber thoracic aorta. Calcification of the aorta and coronary arteries. Mediastinum/Nodes: Esophagus is decompressed. Lymph nodes are not pathologically enlarged. Thyroid gland is unremarkable.  Lungs/Pleura: Emphysematous changes throughout the lungs with scattered fibrosis. Linear infiltration or atelectasis demonstrated in both lung bases, mildly progressed since prior study. This could represent pneumonia or compressive atelectasis. Bronchial wall thickening with mucous plugging or secretions in the lower lobe bronchi peripherally. No pleural effusions. No pneumothorax. Upper Abdomen: Increased density throughout the gallbladder likely representing milk of calcium, tiny stones, or sludge. No wall thickening or inflammatory changes. A 3 mm stone is demonstrated in the left kidney without obvious hydronephrosis. Musculoskeletal: Degenerative changes in the spine. No focal bone lesions. IMPRESSION: 1. Emphysematous changes and scattered fibrosis in the lungs. 2. Linear atelectasis or infiltration in both lung bases with mild progression since prior study. This could represent pneumonia or compressive atelectasis. 3. Bronchial wall thickening with mucous plugging or secretions in the lower lobe bronchi peripherally. This could indicate airways disease or possibly aspiration. 4. Diffusely increased density in the gallbladder, likely representing milk of calcium, tiny stones, or sludge. 5. 3 mm nonobstructing stone in the left kidney. Electronically Signed   By: Burman Nieves M.D.   On: 12/30/2022 15:05   DG CHEST PORT 1 VIEW  Result Date: 12/30/2022 CLINICAL  DATA:  Shortness of breath. EXAM: PORTABLE CHEST 1 VIEW COMPARISON:  Jul 22, 2022. FINDINGS: Stable cardiomediastinal silhouette. Increased interstitial densities are noted in both lung bases concerning for possible pulmonary edema, right greater than left. Bony thorax is unremarkable. IMPRESSION: Increased interstitial densities are noted in both lung bases, right greater than left, concerning for pulmonary edema. Electronically Signed   By: Lupita Raider M.D.   On: 12/30/2022 11:52   VAS US RENAL ARTERY DUPLEX  Result Date:  12/29/2022 ABDOMINAL VISCERAL Patient Name:  GEMA RINGOLD  Date of Exam:   12/29/2022 Medical Rec #: [id number]         Accession #:    [id number] Date of Birth: 14-Mar-1945         Patient Gender: F Patient Age:   47 years Exam Location:  [facility] Procedure:      VAS US RENAL ARTERY DUPLEX Referring Phys: [id number] Cephus Shelling -------------------------------------------------------------------------------- Indications: 50% renal infarction of the left kidney on CT scan at [facility]. High Risk Factors: Hypertension, coronary artery disease. Other Factors: GERD, COPD, tobacco abuse. Comparison Study: No priors. Performing Technologist: Marilynne Halsted RDMS, RVT  Examination Guidelines: A complete evaluation includes B-mode imaging, spectral Doppler, color Doppler, and power Doppler as needed of all accessible portions of each vessel. Bilateral testing is considered an integral part of a complete examination. Limited examinations for reoccurring indications may be performed as noted.  Duplex Findings: +--------------------+--------+--------+------+--------------+ Mesenteric          PSV cm/sEDV cm/sPlaque   Comments    +--------------------+--------+--------+------+--------------+ Aorta Prox             67                                +--------------------+--------+--------+------+--------------+ Celiac Artery Origin                      not visualized +--------------------+--------+--------+------+--------------+ SMA Origin                                not visualized +--------------------+--------+--------+------+--------------+    +------------------+--------+--------+-------+ Right Renal ArteryPSV cm/sEDV cm/sComment +------------------+--------+--------+-------+ Origin              155      39           +------------------+--------+--------+-------+ Proximal            158      35            +------------------+--------+--------+-------+ Mid                 135      36           +------------------+--------+--------+-------+ Distal               89      26           +------------------+--------+--------+-------+ +-----------------+--------+--------+-------+ Left Renal ArteryPSV cm/sEDV cm/sComment +-----------------+--------+--------+-------+ Origin             140      31           +-----------------+--------+--------+-------+ Proximal           142      28           +-----------------+--------+--------+-------+  Mid                143      24           +-----------------+--------+--------+-------+ Distal              87      17           +-----------------+--------+--------+-------+ +------------+--------+--------+----+-----------+--------+--------+----+ Right KidneyPSV cm/sEDV cm/sRI  Left KidneyPSV cm/sEDV cm/sRI   +------------+--------+--------+----+-----------+--------+--------+----+ Upper Pole  35      12      0.66Upper Pole 33      10      0.69 +------------+--------+--------+----+-----------+--------+--------+----+ Mid         49      14      0.        45      15      0.67 +------------+--------+--------+----+-----------+--------+--------+----+ Lower Pole  53      17      0.68Lower Pole 34      11      0.69 +------------+--------+--------+----+-----------+--------+--------+----+ Hilar       65      16      0.76Hilar      47      12      0.75 +------------+--------+--------+----+-----------+--------+--------+----+ +------------------+-----+------------------+-----+ Right Kidney           Left Kidney             +------------------+-----+------------------+-----+ RAR                    RAR                     +------------------+-----+------------------+-----+ RAR (manual)      2.3  RAR (manual)      2.1   +------------------+-----+------------------+-----+ Cortex                 Cortex                   +------------------+-----+------------------+-----+ Cortex thickness       Corex thickness         +------------------+-----+------------------+-----+ Kidney length (cm)11.94Kidney length (cm)13.28 +------------------+-----+------------------+-----+  Summary: Renal:  Right: Patent renal artery without significant stenosis noted. The        resisitive index in the mid pole appears slightly elevated.        Normal size right kidney. Left:  Patent renal artery without significant stenosis noted. The        resisitive index appears within normal limits. Left kidney        appears larger in size when compared to the right kidney.  *See table(s) above for measurements and observations.  Diagnosing physician: Heath Lark  Electronically signed by Heath Lark on 12/29/2022 at 4:08:08 PM.    Final    ECHOCARDIOGRAM COMPLETE  Result Date: 12/29/2022    ECHOCARDIOGRAM REPORT   Patient Name:   Teresita Fanton Date of Exam: 12/29/2022 Medical Rec #:  [id number]        Height:       65.0 in Accession #:    [id number]       Weight:       186.7 lb Date of Birth:  06/20/1945        BSA:          1.921 m Patient Age:    77 years         BP:  118/66 mmHg Patient Gender: F                HR:           76 bpm. Exam Location:  Inpatient Procedure: 2D Echo, Cardiac Doppler and Color Doppler Indications:    Stroke  History:        Patient has prior history of Echocardiogram examinations, most                 recent 09/02/2016. Risk Factors:Family History of Coronary Artery                 Disease and Current Smoker.  Sonographer:    Karma Ganja Referring Phys: Buena Irish  Sonographer Comments: Image acquisition challenging due to COPD. IMPRESSIONS  1. Left ventricular ejection fraction, by estimation, is 60 to 65%. The left ventricle has normal function. The left ventricle has no regional wall motion abnormalities. There is moderate left ventricular hypertrophy. Left ventricular diastolic  parameters are consistent with Grade II diastolic dysfunction (pseudonormalization).  2. Right ventricular systolic function is normal. The right ventricular size is normal.  3. The mitral valve is grossly normal. No evidence of mitral valve regurgitation.  4. The aortic valve is grossly normal. Aortic valve regurgitation is not visualized.  5. The inferior vena cava is normal in size with greater than 50% respiratory variability, suggesting right atrial pressure of 3 mmHg. FINDINGS  Left Ventricle: Left ventricular ejection fraction, by estimation, is 60 to 65%. The left ventricle has normal function. The left ventricle has no regional wall motion abnormalities. The left ventricular internal cavity size was normal in size. There is  moderate left ventricular hypertrophy. Left ventricular diastolic parameters are consistent with Grade II diastolic dysfunction (pseudonormalization). Right Ventricle: The right ventricular size is normal. Right ventricular systolic function is normal. Left Atrium: Left atrial size was normal in size. Right Atrium: Right atrial size was normal in size. Pericardium: There is no evidence of pericardial effusion. Mitral Valve: The mitral valve is grossly normal. No evidence of mitral valve regurgitation. Tricuspid Valve: Tricuspid valve regurgitation is not demonstrated. Aortic Valve: The aortic valve is grossly normal. Aortic valve regurgitation is not visualized. Aortic valve mean gradient measures 6.0 mmHg. Aortic valve peak gradient measures 11.3 mmHg. Aortic valve area, by VTI measures 2.82 cm. Pulmonic Valve: Pulmonic valve regurgitation is not visualized. Aorta: The aortic root and ascending aorta are structurally normal, with no evidence of dilitation. Venous: The inferior vena cava is normal in size with greater than 50% respiratory variability, suggesting right atrial pressure of 3 mmHg. IAS/Shunts: The interatrial septum was not well visualized.  LEFT VENTRICLE PLAX 2D LVIDd:          4.20 cm   Diastology LVIDs:         2.50 cm   LV e' medial:    5.87 cm/s LV PW:         1.30 cm   LV E/e' medial:  19.9 LV IVS:        1.30 cm   LV e' lateral:   8.59 cm/s LVOT diam:     2.00 cm   LV E/e' lateral: 13.6 LV SV:         72 LV SV Index:   38 LVOT Area:     3.14 cm  RIGHT VENTRICLE             IVC RV Basal diam:  3.40 cm     IVC diam: 1.80  cm RV S prime:     15.60 cm/s TAPSE (M-mode): 3.1 cm LEFT ATRIUM             Index        RIGHT ATRIUM           Index LA diam:        4.00 cm 2.08 cm/m   RA Area:     13.50 cm LA Vol (A2C):   63.7 ml 33.16 ml/m  RA Volume:   25.70 ml  13.38 ml/m LA Vol (A4C):   31.3 ml 16.29 ml/m LA Biplane Vol: 47.9 ml 24.93 ml/m  AORTIC VALVE AV Area (Vmax):    2.51 cm AV Area (Vmean):   2.41 cm AV Area (VTI):     2.82 cm AV Vmax:           168.00 cm/s AV Vmean:          111.000 cm/s AV VTI:            0.256 m AV Peak Grad:      11.3 mmHg AV Mean Grad:      6.0 mmHg LVOT Vmax:         134.00 cm/s LVOT Vmean:        85.300 cm/s LVOT VTI:          0.230 m LVOT/AV VTI ratio: 0.90  AORTA Ao Root diam: 2.90 cm Ao Asc diam:  2.80 cm MITRAL VALVE MV Area (PHT): 3.85 cm     SHUNTS MV Decel Time: 197 msec     Systemic VTI:  0.23 m MV E velocity: 117.00 cm/s  Systemic Diam: 2.00 cm MV A velocity: 90.00 cm/s MV E/A ratio:  1.30 Photographer signed by Carolan Clines Signature Date/Time: 12/29/2022/2:35:08 PM    Final         Scheduled Meds:  aspirin EC  81 mg Oral Daily   docusate sodium  100 mg Oral Daily   fluticasone furoate-vilanterol  1 puff Inhalation Daily   ipratropium-albuterol  3 mL Nebulization Q4H   methylPREDNISolone (SOLU-MEDROL) injection  125 mg Intravenous Once   metoprolol succinate  25 mg Oral Daily   pantoprazole  40 mg Oral Daily   umeclidinium bromide  1 puff Inhalation Daily   Continuous Infusions:  dextrose 5 % and 0.45 % NaCl 75 mL/hr at 12/30/22 0900   heparin 1,400 Units/hr (12/30/22 1109)     LOS: 2 days         Huey Bienenstock, MD [facility]   To contact the attending provider between 7A-7P or the covering provider during after hours 7P-7A, please log into the web site [email or web address] and access using universal [place] password for that web site. If you do not have the password, please call the hospital operator.  12/30/2022, 3:20 PM

## 2022-12-30 NOTE — TOC CM/SW Note (Signed)
Transition of Care [facility]) - Inpatient Brief Assessment   Patient Details  Name: Lisa [name] MRN: [id number] Date of Birth: 05-28-1945  Transition of [facility]) CM/SW Contact:    Mearl Latin, LCSW Phone Number: 12/30/2022, 5:03 PM   Clinical Narrative: Patient admitted from home with her daughter who is disabled. TOC following for CIR determination and disposition options.    Transition of Care Asessment: Insurance and Status: Insurance coverage has been reviewed Patient has primary care physician: Yes Home environment has been reviewed: From home Prior level of function:: Independent Prior/Current Home Services: No current home services Social Determinants of Health Reivew: SDOH reviewed no interventions necessary Readmission risk has been reviewed: Yes Transition of care needs: transition of care needs identified, TOC will continue to follow

## 2022-12-31 DIAGNOSIS — J439 Emphysema, unspecified: Secondary | ICD-10-CM | POA: Diagnosis not present

## 2022-12-31 DIAGNOSIS — N280 Ischemia and infarction of kidney: Secondary | ICD-10-CM | POA: Diagnosis not present

## 2022-12-31 LAB — COMPREHENSIVE METABOLIC PANEL
ALT: 65 U/L — ABNORMAL HIGH (ref 0–44)
AST: 28 U/L (ref 15–41)
Albumin: 2.6 g/dL — ABNORMAL LOW (ref 3.5–5.0)
Alkaline Phosphatase: 92 U/L (ref 38–126)
Anion gap: 7 (ref 5–15)
BUN: 10 mg/dL (ref 8–23)
CO2: 25 mmol/L (ref 22–32)
Calcium: 8.8 mg/dL — ABNORMAL LOW (ref 8.9–10.3)
Chloride: 105 mmol/L (ref 98–111)
Creatinine, Ser: 1.07 mg/dL — ABNORMAL HIGH (ref 0.44–1.00)
GFR, Estimated: 53 mL/min — ABNORMAL LOW (ref >60)
Glucose, Bld: 198 mg/dL — ABNORMAL HIGH (ref 70–99)
Potassium: 3.7 mmol/L (ref 3.5–5.1)
Sodium: 137 mmol/L (ref 135–145)
Total Bilirubin: 0.5 mg/dL (ref 0.3–1.2)
Total Protein: 6.5 g/dL (ref 6.5–8.1)

## 2022-12-31 LAB — HEPARIN LEVEL (UNFRACTIONATED): Heparin Unfractionated: 0.36 [IU]/mL (ref 0.30–0.70)

## 2022-12-31 LAB — CBC
HCT: 37.9 % (ref 36.0–46.0)
Hemoglobin: 12.3 g/dL (ref 12.0–15.0)
MCH: 29.8 pg (ref 26.0–34.0)
MCHC: 32.5 g/dL (ref 30.0–36.0)
MCV: 91.8 fL (ref 80.0–100.0)
Platelets: 139 10*3/uL — ABNORMAL LOW (ref 150–400)
RBC: 4.13 MIL/uL (ref 3.87–5.11)
RDW: 15.4 % (ref 11.5–15.5)
WBC: 10.5 10*3/uL (ref 4.0–10.5)
nRBC: 0 % (ref 0.0–0.2)

## 2022-12-31 LAB — VAS US ABI WITH/WO TBI
Left ABI: 0.74
Right ABI: 0.7

## 2022-12-31 LAB — BRAIN NATRIURETIC PEPTIDE: B Natriuretic Peptide: 254.9 pg/mL — ABNORMAL HIGH (ref 0.0–100.0)

## 2022-12-31 MED ORDER — POTASSIUM CHLORIDE CRYS ER 20 MEQ PO TBCR
40.0000 meq | EXTENDED_RELEASE_TABLET | Freq: Once | ORAL | Status: AC
Start: 2022-12-31 — End: 2022-12-31
  Administered 2022-12-31: 40 meq via ORAL
  Filled 2022-12-31: qty 2

## 2022-12-31 MED ORDER — TRIFLURIDINE 1 % OP SOLN
1.0000 [drp] | OPHTHALMIC | Status: DC
  Administered 2022-12-31 – 2023-01-02 (×16): 1 [drp] via OPHTHALMIC
  Filled 2022-12-31: qty 7.5

## 2022-12-31 MED ORDER — IPRATROPIUM-ALBUTEROL 0.5-2.5 (3) MG/3ML IN SOLN
3.0000 mL | Freq: Four times a day (QID) | RESPIRATORY_TRACT | Status: DC
  Administered 2022-12-31 – 2023-01-01 (×3): 3 mL via RESPIRATORY_TRACT
  Filled 2022-12-31 (×3): qty 3

## 2022-12-31 MED ORDER — APIXABAN 5 MG PO TABS
5.0000 mg | ORAL_TABLET | Freq: Two times a day (BID) | ORAL | Status: DC
  Administered 2022-12-31 – 2023-01-02 (×4): 5 mg via ORAL
  Filled 2022-12-31 (×4): qty 1

## 2022-12-31 MED ORDER — MAGIC MOUTHWASH
5.0000 mL | Freq: Four times a day (QID) | ORAL | Status: DC
  Administered 2022-12-31: 5 mL via ORAL
  Filled 2022-12-31 (×12): qty 5

## 2022-12-31 MED ORDER — FUROSEMIDE 10 MG/ML IJ SOLN
40.0000 mg | Freq: Once | INTRAMUSCULAR | Status: AC
  Administered 2022-12-31: 40 mg via INTRAVENOUS
  Filled 2022-12-31: qty 4

## 2022-12-31 NOTE — Evaluation (Signed)
Occupational Therapy Evaluation Patient Details Name: Lisa [name] MRN: [id number] DOB: [birth year] Today's Date: 12/31/2022   History of Present Illness Pt is a 77 y/o F admitted on 12/28/22 after presenting with c/o acute onset of abdominal pain. 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 PMH: COPD, tobacco abuse, HTN, CAD   Clinical Impression   Pt. Was motivated to work with OT and was cooperative during session. Pt. Amb in hallway holding onto iv pole. Pt. Will need further ed on energy conservation. Acute OT to follow. Pt. Is being following by CIR for possible rehab stay.       If plan is discharge home, recommend the following: A little help with walking and/or transfers;A little help with bathing/dressing/bathroom;Assistance with cooking/housework    Functional Status Assessment  Patient has had a recent decline in their functional status and demonstrates the ability to make significant improvements in function in a reasonable and predictable amount of time.  Equipment Recommendations   (to be determined by dc location)    Recommendations for Other Services  (cir are following)     Precautions / Restrictions Precautions Precautions: Fall Restrictions Weight Bearing Restrictions: No      Mobility Bed Mobility   Bed Mobility: Supine to Sit     Supine to sit: Supervision, HOB elevated, Used rails     General bed mobility comments: Pt. needed extra time and rest break prior to standing.    Transfers Overall transfer level: Needs assistance   Transfers: Sit to/from Stand, Bed to chair/wheelchair/BSC Sit to Stand: Contact guard assist     Step pivot transfers: Contact guard assist     General transfer comment: Pt. AMB in hallway and used iv pole to balance.      Balance     Sitting balance-Leahy Scale: Good       Standing balance-Leahy Scale: Fair                              ADL either performed or assessed with clinical judgement   ADL Overall ADL's : Needs assistance/impaired Eating/Feeding: Independent   Grooming: Wash/dry hands;Wash/dry face;Supervision/safety;Standing   Upper Body Bathing: Set up;Sitting   Lower Body Bathing: Minimal assistance;Sit to/from stand   Upper Body Dressing : Minimal assistance;Sitting   Lower Body Dressing: Minimal assistance;Sit to/from stand   Toilet Transfer: Minimal assistance;Ambulation;Grab bars;Comfort height toilet   Toileting- Clothing Manipulation and Hygiene: Minimal assistance;Sit to/from stand       Functional mobility during ADLs: Minimal assistance (holding iv pole) General ADL Comments: Pt. is using cross leg technique for LE dresing.     Vision Baseline Vision/History: 0 No visual deficits Patient Visual Report: No change from baseline Vision Assessment?: No apparent visual deficits     Perception         Praxis         Pertinent Vitals/Pain Pain Assessment Pain Assessment: No/denies pain     Extremity/Trunk Assessment Upper Extremity Assessment Upper Extremity Assessment: Generalized weakness           Communication Communication Communication: No apparent difficulties   Cognition Arousal: Alert Behavior During Therapy: WFL for tasks assessed/performed Overall Cognitive Status: Within Functional Limits for tasks assessed  General Comments       Exercises     Shoulder Instructions      Home Living Family/patient expects to be discharged to:: Private residence Living Arrangements: Children Available Help at Discharge: Family;Available 24 hours/day Type of Home: House Home Access: Stairs to enter [employer] of Steps: 2 at back door Entrance Stairs-Rails: Right Home Layout: One level               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: Firefighter   Additional Comments: Lives with disabled daughter (assists  daughter with IADLs)      Prior Functioning/Environment Prior Level of Function : Independent/Modified Independent;Driving             Mobility Comments: independent without AD, denies falls ADLs Comments: Independent        OT Problem List: Decreased strength;Decreased activity tolerance;Decreased knowledge of use of DME or AE;Cardiopulmonary status limiting activity      OT Treatment/Interventions: Self-care/ADL training;Energy conservation;DME and/or AE instruction;Therapeutic activities;Patient/family education    OT Goals(Current goals can be found in the care plan section) Acute Rehab OT Goals Patient Stated Goal: to go home OT Goal Formulation: With patient Time For Goal Achievement: 01/14/23 Potential to Achieve Goals: Good ADL Goals Pt Will Perform Lower Body Bathing: with supervision;sit to/from stand Pt Will Perform Lower Body Dressing: with supervision;sit to/from stand Pt Will Transfer to Toilet: with supervision;stand pivot transfer Pt Will Perform Toileting - Clothing Manipulation and hygiene: with supervision;sit to/from stand Pt Will Perform Tub/Shower Transfer: with supervision;ambulating  OT Frequency: Min 2X/week    Co-evaluation              AM-PAC OT "6 Clicks" Daily Activity     Outcome Measure Help from another person eating meals?: None Help from another person taking care of personal grooming?: A Little Help from another person toileting, which includes using toliet, bedpan, or urinal?: A Little Help from another person bathing (including washing, rinsing, drying)?: A Little Help from another person to put on and taking off regular upper body clothing?: A Little Help from another person to put on and taking off regular lower body clothing?: A Little 6 Click Score: 19   End of Session Equipment Utilized During Treatment: Oxygen Nurse Communication:  (ok therapy)  Activity Tolerance: Patient tolerated treatment well Patient left: in  chair;with call bell/phone within reach;with chair alarm set;with family/visitor present  OT Visit Diagnosis: Unsteadiness on feet (R26.81);Muscle weakness (generalized) (M62.81)                Time: 1610-9604 OT Time Calculation (min): 52 min Charges:  OT General Charges $OT Visit: 1 Visit OT Evaluation $OT Eval Moderate Complexity: 1 Mod OT Treatments $Self Care/Home Management : 8-22 mins $Therapeutic Activity: 8-22 mins  Derrek Gu OT/L   Darell Saputo 12/31/2022, 10:59 AM

## 2022-12-31 NOTE — Progress Notes (Signed)
PROGRESS NOTE    Lisa [name]  KVQ:[id number] DOB: 31-Aug-1945 DOA: 12/28/2022 PCP: Lisa Peri, MD   No chief complaint on file.   Brief Narrative:   Lisa [name] is a 77 y.o. female with medical history significant for COPD, tobacco abuse, hypertension, and coronary artery disease with history of drug-eluting stent 6 years ago.  She developed acute onset abdominal pain at approximately 4:00 this morning.  She was up having some coffee when the pain started.  She moved around and did everything she could but the pain just got worse as the hours went by.  Eventually she presented to an outside emergency department.  Her workup included a CT scan of the abdomen pelvis which revealed the renal infarct which involved greater than 50% of her left kidney.  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  The patient was started on IV heparin and was transferred here.  She continues to have pain but denies fevers or chills or chest pain or nausea or vomiting or diarrhea.   Assessment & Plan:   Principal Problem:   Renal infarction [facility]) Active Problems:   COPD (chronic obstructive pulmonary disease) (HCC)  Left renal infarct  -This most likely due to thromboembolic/atheroembolic event, no clear etiology so far, continue with heparin GTT over the next 24 hours, likely will transition to DOAC as an outpatient for 39-month discussed with vascular surgery -echo with no acute finding or concern for thrombosis  -Likely she will need 30 days event monitor as an outpatient to evaluate for source of thrombosis -As discussed with vascular surgery, renal duplex confirmed that left renal artery is patent without flow-limiting stenosis. -Brein GTT for anticoagulation, will transition to Eliquis today, -Developed significant epistaxis to Plavix in the past.  AKI -Plan creatinine 0.65, peaked at 1.1, avoid nephrotoxic medications  PAD -vascular surgery input greatly appreciated  COPD - on  scheduled DuoNebs.  Hypertension -I will hold her metoprolol given overall her blood pressure remains soft  -As well continue to hold her ACE inhibitor in the setting of worsening creatinine  -He is on low-dose midodrine to avoid low blood pressure especially now she is on diuresis  Acute on chronic diastolic CHF Acute hypoxic respiratory failure -Remains with significant oxygen requirement, up to 6 L oxygen, BNP is trending up, evidence of volume overload -will start on Lasix, will dose daily given low blood pressure and AKI  -CT chest significant for emphysema, compressive atelectasis bronchial airway thickening, she was encouraged use incentive spirometry, flutter valve, will consult SLP as well but aspiration  Right eye pain - Patient reports right eye sharp pain, developed overnight, and some blurry vision, she denies any headache, sudden vision loss, or jaw claudication, she is afebrile. -Ophthalmology input greatly appreciated, Dr. Vanessa Barbara will evaluate her this evening for further recommendations.  Transaminitis -continue to trend, improving.  Right eye herpes simplex dendritic keratitis -Ophthalmology input greatly appreciated, continue with ofloxacin ophthalmic, Valtrex, continue with antiviral eyedrop (ganciclovir changed to Viroptic due to availability)    DVT prophylaxis: Heparin GTT Code Status: full Family Communication: D/W daughter at bedside Disposition:   Status is: Inpatient    Consultants:  Vascular Surgery   Subjective:  Patient denies any chest pain, reports dyspnea still present, right eye pain has improved  Objective: Vitals:   12/31/22 0411 12/31/22 0833 12/31/22 0845 12/31/22 1143  BP:  (!) 122/57  119/60  Pulse: 70 71  69  Resp:    14  Temp:    97.9  F (36.6 C)  TempSrc:    Oral  SpO2: 91% 93% 93% 92%  Weight:      Height:        Intake/Output Summary (Last 24 hours) at 12/31/2022 1329 Last data filed at 12/30/2022 1800 Gross per 24  hour  Intake 625.64 ml  Output --  Net 625.64 ml   Filed Weights   12/28/22 1956  Weight: 84.7 kg    Examination:  Awake Alert, Oriented X 3, frail Symmetrical Chest wall movement, scattered Rales bilaterally RRR,No Gallops,Rubs or new Murmurs, No Parasternal Heave +ve B.Sounds, Abd Soft, No tenderness, No rebound - guarding or rigidity. No Cyanosis, Clubbing, +1 edema    Data Reviewed: I have personally reviewed following labs and imaging studies  CBC: Recent Labs  Lab 12/29/22 0606 12/30/22 0320 12/31/22 0441  WBC 8.5 11.1* 10.5  HGB 13.5 11.5* 12.3  HCT 43.3 37.5 37.9  MCV 93.3 93.1 91.8  PLT 163 139* 139*    Basic Metabolic Panel: Recent Labs  Lab 12/29/22 0606 12/30/22 0320 12/31/22 0441  NA 135 134* 137  K 4.0 3.6 3.7  CL 100 100 105  CO2 27 25 25   GLUCOSE 139* 142* 198*  BUN 10 11 10   CREATININE 1.09* 1.16* 1.07*  CALCIUM 8.7* 7.9* 8.8*    GFR: Estimated Creatinine Clearance: 45.8 mL/min (A) (by C-G formula based on SCr of 1.07 mg/dL (H)).  Liver Function Tests: Recent Labs  Lab 12/29/22 0606 12/30/22 1016 12/31/22 0441  AST 131* 48* 28  ALT 114* 80* 65*  ALKPHOS 103 87 92  BILITOT 0.9 1.2 0.5  PROT 6.3* 6.4* 6.5  ALBUMIN 3.0* 2.8* 2.6*    CBG: No results for input(s): "GLUCAP" in the last 168 hours.   No results found for this or any previous visit (from the past 240 hour(s)).       Radiology Studies: VAS [place] ABI WITH/WO TBI  Result Date: 12/31/2022  LOWER EXTREMITY DOPPLER STUDY Patient Name:  Lisa [name]  Date of Exam:   12/30/2022 Medical Rec #: [id number]         Accession #:    [id number] Date of Birth: [birth year]         Patient Gender: F Patient Age:   81 years Exam Location:  [facility] Procedure:      VAS [place] ABI WITH/WO TBI Referring Phys: Crystol [facility] --------------------------------------------------------------------------------  Indications: Claudication. High Risk Factors: Hypertension,  hyperlipidemia, current smoker, coronary artery                    disease.  Comparison Study: No priors. Performing Technologist: Fernande Bras  Examination Guidelines: A complete evaluation includes at minimum, Doppler waveform signals and systolic blood pressure reading at the level of bilateral brachial, anterior tibial, and posterior tibial arteries, when vessel segments are accessible. Bilateral testing is considered an integral part of a complete examination. Photoelectric Plethysmograph (PPG) waveforms and toe systolic pressure readings are included as required and additional duplex testing as needed. Limited examinations for reoccurring indications may be performed as noted.  ABI Findings: +---------+------------------+-----+---------+--------+ Right    Rt Pressure (mmHg)IndexWaveform Comment  +---------+------------------+-----+---------+--------+ Brachial 118                    triphasic         +---------+------------------+-----+---------+--------+ PTA      81                0.69 biphasic          +---------+------------------+-----+---------+--------+  DP       83                0.70 biphasic          +---------+------------------+-----+---------+--------+ Great Toe57                0.48                   +---------+------------------+-----+---------+--------+ +---------+------------------+-----+---------+-------+ Left     Lt Pressure (mmHg)IndexWaveform Comment +---------+------------------+-----+---------+-------+ Brachial 116                    triphasic        +---------+------------------+-----+---------+-------+ PTA      81                0.69 biphasic         +---------+------------------+-----+---------+-------+ DP       87                0.74 biphasic         +---------+------------------+-----+---------+-------+ Great Toe58                0.49                  +---------+------------------+-----+---------+-------+  +-------+-----------+-----------+------------+------------+ ABI/TBIToday's ABIToday's TBIPrevious ABIPrevious TBI +-------+-----------+-----------+------------+------------+ Right  0.70       0.48                                +-------+-----------+-----------+------------+------------+ Left   0.74       0.49                                +-------+-----------+-----------+------------+------------+  Summary: Right: Resting right ankle-brachial index indicates moderate right lower extremity arterial disease. The right toe-brachial index is abnormal. Left: Resting left ankle-brachial index indicates moderate left lower extremity arterial disease. The left toe-brachial index is abnormal. *See table(s) above for measurements and observations.  Electronically signed by Coral Else MD on 12/31/2022 at 10:52:07 AM.    Final    CT CHEST WO CONTRAST  Result Date: 12/30/2022 CLINICAL DATA:  Abnormal x-ray with lung opacity. Hypoxia. Rales in the lung base. EXAM: CT CHEST WITHOUT CONTRAST TECHNIQUE: Multidetector CT imaging of the chest was performed following the standard protocol without IV contrast. RADIATION DOSE REDUCTION: This exam was performed according to the departmental dose-optimization program which includes automated exposure control, adjustment of the mA and/or kV according to patient size and/or use of iterative reconstruction technique. COMPARISON:  Chest radiograph 12/30/2022.  CT chest 07/22/2022 FINDINGS: Cardiovascular: Normal heart size. No pericardial effusions. Normal caliber thoracic aorta. Calcification of the aorta and coronary arteries. Mediastinum/Nodes: Esophagus is decompressed. Lymph nodes are not pathologically enlarged. Thyroid gland is unremarkable. Lungs/Pleura: Emphysematous changes throughout the lungs with scattered fibrosis. Linear infiltration or atelectasis demonstrated in both lung bases, mildly progressed since prior study. This could represent pneumonia or  compressive atelectasis. Bronchial wall thickening with mucous plugging or secretions in the lower lobe bronchi peripherally. No pleural effusions. No pneumothorax. Upper Abdomen: Increased density throughout the gallbladder likely representing milk of calcium, tiny stones, or sludge. No wall thickening or inflammatory changes. A 3 mm stone is demonstrated in the left kidney without obvious hydronephrosis. Musculoskeletal: Degenerative changes in the spine. No focal bone lesions. IMPRESSION: 1. Emphysematous changes and scattered fibrosis in the lungs. 2. Linear atelectasis  or infiltration in both lung bases with mild progression since prior study. This could represent pneumonia or compressive atelectasis. 3. Bronchial wall thickening with mucous plugging or secretions in the lower lobe bronchi peripherally. This could indicate airways disease or possibly aspiration. 4. Diffusely increased density in the gallbladder, likely representing milk of calcium, tiny stones, or sludge. 5. 3 mm nonobstructing stone in the left kidney. Electronically Signed   By: Burman Nieves M.D.   On: 12/30/2022 15:05   DG CHEST PORT 1 VIEW  Result Date: 12/30/2022 CLINICAL DATA:  Shortness of breath. EXAM: PORTABLE CHEST 1 VIEW COMPARISON:  Jul 22, 2022. FINDINGS: Stable cardiomediastinal silhouette. Increased interstitial densities are noted in both lung bases concerning for possible pulmonary edema, right greater than left. Bony thorax is unremarkable. IMPRESSION: Increased interstitial densities are noted in both lung bases, right greater than left, concerning for pulmonary edema. Electronically Signed   By: Lupita Raider M.D.   On: 12/30/2022 11:52        Scheduled Meds:  apixaban  5 mg Oral BID   docusate sodium  100 mg Oral Daily   fluticasone furoate-vilanterol  1 puff Inhalation Daily   ipratropium-albuterol  3 mL Nebulization Q6H   magic mouthwash  5 mL Oral QID   midodrine  5 mg Oral BID WC   ofloxacin  1  drop Right Eye QID   pantoprazole  40 mg Oral Daily   trifluridine  1 drop Right Eye Q2H while awake   umeclidinium bromide  1 puff Inhalation Daily   valACYclovir  1,000 mg Oral BID   Continuous Infusions:     LOS: 3 days        Huey Bienenstock, MD [facility]   To contact the attending provider between 7A-7P or the covering provider during after hours 7P-7A, please log into the web site [email or web address] and access using universal [place] password for that web site. If you do not have the password, please call the hospital operator.  12/31/2022, 1:29 PM

## 2022-12-31 NOTE — Evaluation (Signed)
Clinical/Bedside Swallow Evaluation Patient Details  Name: Lisa [name] MRN: [id number] Date of Birth: [birth year]  Today's Date: 12/31/2022 Time: SLP Start Time (ACUTE ONLY): 1600 SLP Stop Time (ACUTE ONLY): 1610 SLP Time Calculation (min) (ACUTE ONLY): 10 min  Past Medical History:  Past Medical History:  Diagnosis Date   Asthma    COPD (chronic obstructive pulmonary disease) (HCC)    Coronary artery disease    Cough    " ALL MY LIFE "   Dyspnea    GERD (gastroesophageal reflux disease)    Hypertension    NSTEMI (non-ST elevated myocardial infarction) (HCC) 09/01/2016   DES to Cx/OM bifurcation   Unstable angina (HCC) 08/2016   Past Surgical History:  Past Surgical History:  Procedure Laterality Date   ABDOMINAL HYSTERECTOMY     CORONARY STENT INTERVENTION  09/05/2016   Successful complex PCI of the circumflex/OM bifurcation using a Synergy DES   CORONARY STENT INTERVENTION N/A 09/05/2016   Procedure: Coronary Stent Intervention;  Surgeon: Tonny Bollman, MD;  Location: [facility] INVASIVE CV LAB;  Service: Cardiovascular;  Laterality: N/A;   LEFT HEART CATH AND CORONARY ANGIOGRAPHY N/A 09/02/2016   Procedure: Left Heart Cath and Coronary Angiography;  Surgeon: Corky Crafts, MD;  Location: [facility] INVASIVE CV LAB;  Service: Cardiovascular;  Laterality: N/A;   HPI:  Patient is a 77 y.o. female with PMH: COPD, GERD, tobacco abuse, HTN, CAD with h/o drug-eluting stent 6 years ago. She presented to the hospital on 12/28/22 with acute onset of abdominal pain. CDT scan of ab/pelvis showed renal infarct involving greater than 50% of her left kidney. 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 CT chest significant for emphysema, compressive atelectasis bronchial airway thickening. SLP swallow evaluation ordered to r/o aspiration as potential contributer to her acute hypoxic respiratory failure.    Assessment / Plan / Recommendation  Clinical Impression  Patient is not currently  presenting with clinical s/s of dysphagia as per this bedside swallow evaluation. Patient endorses GERD and takes Nexium which she reports helps. She mainly experiences GERD symptoms when she has had something "spicy" but she feels it is managed well at this point. SLP assessed her swallow function with successive straw sips of thin liquids (water). She did stop once to belch but was able to drink continuously without change in vitals and without overt s/s aspiration. Swallow intiiation was timely. SLP not suspecting oropharyngeal dysphagia at this time. SLP advised patient to continue to manage her GERD symptoms and f/u with her PCP if any changes. No further skilled SLP intervention warranted at this time. SLP Visit Diagnosis: Dysphagia, unspecified (R13.10)    Aspiration Risk  No limitations    Diet Recommendation Regular;Thin liquid    Liquid Administration via: Cup;Straw Medication Administration: Whole meds with liquid Supervision: Patient able to self feed Postural Changes: Seated upright at 90 degrees;Remain upright for at least 30 minutes after po intake    Other  Recommendations Oral Care Recommendations: Oral care BID;Patient independent with oral care    Recommendations for follow up therapy are one component of a multi-disciplinary discharge planning process, led by the attending physician.  Recommendations may be updated based on patient status, additional functional criteria and insurance authorization.  Follow up Recommendations No SLP follow up      Assistance Recommended at Discharge    Functional Status Assessment Patient has not had a recent decline in their functional status  Frequency and Duration     N/A  Prognosis   N/A     Swallow Study   General Date of Onset: 12/28/22 HPI: Patient is a 77 y.o. female with PMH: COPD, GERD, tobacco abuse, HTN, CAD with h/o drug-eluting stent 6 years ago. She presented to the hospital on 12/28/22 with acute onset of  abdominal pain. CDT scan of ab/pelvis showed renal infarct involving greater than 50% of her left kidney. 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 CT chest significant for emphysema, compressive atelectasis bronchial airway thickening. SLP swallow evaluation ordered to r/o aspiration as potential contributer to her acute hypoxic respiratory failure. Type of Study: Bedside Swallow Evaluation Previous Swallow Assessment: none found Diet Prior to this Study: Regular;Thin liquids (Level 0) Temperature Spikes Noted: No Respiratory Status: Nasal cannula History of Recent Intubation: No Behavior/Cognition: Alert;Cooperative;Pleasant mood Oral Cavity Assessment: Within Functional Limits Oral Care Completed by SLP: No Oral Cavity - Dentition: Adequate natural dentition Vision: Functional for self-feeding Self-Feeding Abilities: Able to feed self Patient Positioning: Upright in bed Baseline Vocal Quality: Normal Volitional Swallow: Able to elicit    Oral/Motor/Sensory Function Overall Oral Motor/Sensory Function: Within functional limits   Ice Chips     Thin Liquid Thin Liquid: Within functional limits Presentation: Straw;Self Fed    Nectar Thick     Honey Thick     Puree     Solid            Angela Nevin, MA, CCC-SLP Speech Therapy

## 2022-12-31 NOTE — Progress Notes (Signed)
PHARMACY - ANTICOAGULATION CONSULT NOTE  Pharmacy Consult for Heparin Indication: renal artery thromboembolism  Allergies  Allergen Reactions   Brilinta [Ticagrelor] Shortness Of Breath   Penicillins Anaphylaxis    Has patient had a PCN reaction causing immediate rash, facial/tongue/throat swelling, SOB or lightheadedness with hypotension: Yes Has patient had a PCN reaction causing severe rash involving mucus membranes or skin necrosis: yes Has patient had a PCN reaction that required hospitalization: no Has patient had a PCN reaction occurring within the last 10 years: no If all of the above answers are "NO", then may proceed with Cephalosporin use.    Ranexa [Ranolazine] Shortness Of Breath   Sulfa Antibiotics Anaphylaxis and Rash    Blistering rash on hands and feet   Crestor [Rosuvastatin] Other (See Comments)    Myalgias    Patient Measurements: Height: 5\' 5"  (165.1 cm) Weight: 84.7 kg (186 lb 11.7 oz) IBW/kg (Calculated) : 57 Heparin Dosing Weight: 75.3 kg  Vital Signs: Temp: 98.1 F (36.7 C) (11/02 0024) Temp Source: Oral (11/02 0024) BP: 121/50 (11/02 0024) Pulse Rate: 70 (11/02 0411)  Labs: Recent Labs    12/29/22 0606 12/29/22 1936 12/30/22 0320 12/30/22 2044 12/31/22 0441  HGB 13.5  --  11.5*  --  12.3  HCT 43.3  --  37.5  --  37.9  PLT 163  --  139*  --  139*  HEPARINUNFRC 0.28*   < > 0.22* 0.31 0.36  CREATININE 1.09*  --  1.16*  --  1.07*   < > = values in this interval not displayed.    Estimated Creatinine Clearance: 45.8 mL/min (A) (by C-G formula based on SCr of 1.07 mg/dL (H)).   Medical History: Past Medical History:  Diagnosis Date   Asthma    COPD (chronic obstructive pulmonary disease) (HCC)    Coronary artery disease    Cough    " ALL MY LIFE "   Dyspnea    GERD (gastroesophageal reflux disease)    Hypertension    NSTEMI (non-ST elevated myocardial infarction) (HCC) 09/01/2016   DES to Cx/OM bifurcation   Unstable angina (HCC)  08/2016    Medications:  Scheduled:   aspirin EC  81 mg Oral Daily   docusate sodium  100 mg Oral Daily   fluticasone furoate-vilanterol  1 puff Inhalation Daily   Ganciclovir  1 drop Right Eye 5 X Daily   ipratropium-albuterol  3 mL Nebulization Q4H   midodrine  5 mg Oral BID WC   ofloxacin  1 drop Right Eye QID   pantoprazole  40 mg Oral Daily   umeclidinium bromide  1 puff Inhalation Daily   valACYclovir  1,000 mg Oral BID   Infusions:   heparin 1,450 Units/hr (12/30/22 2141)   PRN: acetaminophen **OR** acetaminophen, HYDROmorphone (DILAUDID) injection, ipratropium-albuterol, ondansetron **OR** ondansetron (ZOFRAN) IV, oxyCODONE, sorbitol  Assessment: 77 yo female transferred from [facility] on a heparin drip for renal artery thromboembolism.  Patient not on anticoagulants PTA. BMI 31.  Heparin level came back therapeutic at 0.36 but on the lower end of goal. Will increase rate slightly. Hgb 12.3 (stable) and plts 139. No signs of bleeding or issues with the infusion noted per nursing team.   Copay check for 30 day supply: Eliquis copay is $4.60, Xarelto copay is $4.60, Warfarin 5 mg copay is $0.00  Goal of Therapy:  Heparin level 0.3-0.7 units/ml Monitor platelets by anticoagulation protocol: Yes   Plan:  Increase Heparin infusion to 1500 units/hr  Check  8 hour heparin level  Daily HL and CBC Monitor for signs/symptoms of bleeding  Roslyn Smiling, PharmD PGY1 Pharmacy Resident 12/31/2022 7:46 AM

## 2022-12-31 NOTE — Plan of Care (Signed)
  Problem: Education: Goal: Knowledge of General Education information will improve Description: Including pain rating scale, medication(s)/side effects and non-pharmacologic comfort measures Outcome: Progressing   Problem: Health Behavior/Discharge Planning: Goal: Ability to manage health-related needs will improve Outcome: Progressing   Problem: Clinical Measurements: Goal: Ability to maintain clinical measurements within normal limits will improve Outcome: Progressing Goal: Will remain free from infection Outcome: Progressing Goal: Diagnostic test results will improve Outcome: Progressing Goal: Respiratory complications will improve Outcome: Progressing Goal: Cardiovascular complication will be avoided Outcome: Progressing   Problem: Nutrition: Goal: Adequate nutrition will be maintained Outcome: Progressing   Problem: Coping: Goal: Level of anxiety will decrease Outcome: Progressing   Problem: Pain Management: Goal: General experience of comfort will improve Outcome: Progressing   Problem: Safety: Goal: Ability to remain free from injury will improve Outcome: Progressing   Problem: Skin Integrity: Goal: Risk for impaired skin integrity will decrease Outcome: Progressing

## 2022-12-31 NOTE — Plan of Care (Signed)

## 2022-12-31 NOTE — Progress Notes (Signed)
    Subjective  -   No complaints this morning.  She is no longer having leg pain.  Her flank pain is improved   Physical Exam:  Palpable femoral pulses, nonpalpable pedal pulses No evidence of lower extremity ischemia       Assessment/Plan:    Left renal infarct: Embolic workup has been unremarkable.  Possible explanation of renal infarct is the plaque at the ostium of the left renal artery.  There is no stenosis that would require intervention at this time.  She does have a history of severe nosebleed on Plavix.  She has been tolerating heparin.  I discussed with the hospitalist, we will start her on a DOAC and have her to follow-up with Dr. Chestine Spore in 1 month.  She can be discharged home from our perspective.  Lisa [name] 12/31/2022 10:33 AM --  Vitals:   12/31/22 0833 12/31/22 0845  BP: (!) 122/57   Pulse: 71   Resp:    Temp:    SpO2: 93% 93%    Intake/Output Summary (Last 24 hours) at 12/31/2022 1033 Last data filed at 12/30/2022 1800 Gross per 24 hour  Intake 625.64 ml  Output --  Net 625.64 ml     Laboratory CBC    Component Value Date/Time   WBC 10.5 12/31/2022 0441   HGB 12.3 12/31/2022 0441   HCT 37.9 12/31/2022 0441   PLT 139 (L) 12/31/2022 0441    BMET    Component Value Date/Time   NA 137 12/31/2022 0441   K 3.7 12/31/2022 0441   CL 105 12/31/2022 0441   CO2 25 12/31/2022 0441   GLUCOSE 198 (H) 12/31/2022 0441   BUN 10 12/31/2022 0441   CREATININE 1.07 (H) 12/31/2022 0441   CALCIUM 8.8 (L) 12/31/2022 0441   GFRNONAA 53 (L) 12/31/2022 0441   GFRAA >60 02/24/2018 1937    COAG Lab Results  Component Value Date   INR 1.07 09/01/2016   No results found for: "PTT"  Antibiotics Anti-infectives (From admission, onward)    Start     Dose/Rate Route Frequency Ordered Stop   12/30/22 1930  valACYclovir (VALTREX) tablet 1,000 mg        1,000 mg Oral 2 times daily 12/30/22 1839 01/06/23 1959        V. Charlena Cross, M.D.,  [facility] [place] Office: [phone] Pager:  [phone]

## 2023-01-01 DIAGNOSIS — N280 Ischemia and infarction of kidney: Secondary | ICD-10-CM | POA: Diagnosis not present

## 2023-01-01 DIAGNOSIS — J439 Emphysema, unspecified: Secondary | ICD-10-CM | POA: Diagnosis not present

## 2023-01-01 LAB — CBC
HCT: 35.5 % — ABNORMAL LOW (ref 36.0–46.0)
Hemoglobin: 11.3 g/dL — ABNORMAL LOW (ref 12.0–15.0)
MCH: 29.1 pg (ref 26.0–34.0)
MCHC: 31.8 g/dL (ref 30.0–36.0)
MCV: 91.5 fL (ref 80.0–100.0)
Platelets: ADEQUATE 10*3/uL (ref 150–400)
RBC: 3.88 MIL/uL (ref 3.87–5.11)
RDW: 15.9 % — ABNORMAL HIGH (ref 11.5–15.5)
WBC: 8.9 10*3/uL (ref 4.0–10.5)
nRBC: 0 % (ref 0.0–0.2)

## 2023-01-01 LAB — BASIC METABOLIC PANEL
Anion gap: 9 (ref 5–15)
BUN: 21 mg/dL (ref 8–23)
CO2: 27 mmol/L (ref 22–32)
Calcium: 8.9 mg/dL (ref 8.9–10.3)
Chloride: 105 mmol/L (ref 98–111)
Creatinine, Ser: 1.07 mg/dL — ABNORMAL HIGH (ref 0.44–1.00)
GFR, Estimated: 53 mL/min — ABNORMAL LOW (ref >60)
Glucose, Bld: 123 mg/dL — ABNORMAL HIGH (ref 70–99)
Potassium: 4.2 mmol/L (ref 3.5–5.1)
Sodium: 141 mmol/L (ref 135–145)

## 2023-01-01 LAB — MAGNESIUM: Magnesium: 1.8 mg/dL (ref 1.7–2.4)

## 2023-01-01 LAB — PHOSPHORUS: Phosphorus: 2.3 mg/dL — ABNORMAL LOW (ref 2.5–4.6)

## 2023-01-01 MED ORDER — NICOTINE POLACRILEX 2 MG MT GUM
2.0000 mg | CHEWING_GUM | OROMUCOSAL | Status: DC | PRN
  Administered 2023-01-01: 2 mg via ORAL
  Filled 2023-01-01 (×2): qty 1

## 2023-01-01 MED ORDER — FUROSEMIDE 10 MG/ML IJ SOLN
20.0000 mg | Freq: Once | INTRAMUSCULAR | Status: AC
  Administered 2023-01-01: 20 mg via INTRAVENOUS
  Filled 2023-01-01: qty 2

## 2023-01-01 MED ORDER — SALINE SPRAY 0.65 % NA SOLN
1.0000 | NASAL | Status: DC | PRN
  Administered 2023-01-01: 1 via NASAL
  Filled 2023-01-01: qty 44

## 2023-01-01 MED ORDER — MOMETASONE FURO-FORMOTEROL FUM 200-5 MCG/ACT IN AERO
2.0000 | INHALATION_SPRAY | Freq: Two times a day (BID) | RESPIRATORY_TRACT | Status: DC
  Administered 2023-01-01 – 2023-01-02 (×2): 2 via RESPIRATORY_TRACT
  Filled 2023-01-01: qty 8.8

## 2023-01-01 MED ORDER — ATORVASTATIN CALCIUM 10 MG PO TABS
20.0000 mg | ORAL_TABLET | Freq: Every day | ORAL | Status: DC
Start: 2023-01-01 — End: 2023-01-01
  Administered 2023-01-01: 20 mg via ORAL
  Filled 2023-01-01: qty 2

## 2023-01-01 MED ORDER — IPRATROPIUM-ALBUTEROL 0.5-2.5 (3) MG/3ML IN SOLN
3.0000 mL | Freq: Two times a day (BID) | RESPIRATORY_TRACT | Status: DC
  Filled 2023-01-01: qty 3

## 2023-01-01 MED ORDER — FUROSEMIDE 10 MG/ML IJ SOLN
40.0000 mg | Freq: Once | INTRAMUSCULAR | Status: AC
  Administered 2023-01-01: 40 mg via INTRAVENOUS
  Filled 2023-01-01: qty 4

## 2023-01-01 MED ORDER — K PHOS MONO-SOD PHOS DI & MONO 155-852-130 MG PO TABS
500.0000 mg | ORAL_TABLET | Freq: Two times a day (BID) | ORAL | Status: DC
  Administered 2023-01-01 (×2): 500 mg via ORAL
  Filled 2023-01-01 (×2): qty 2

## 2023-01-01 NOTE — Plan of Care (Signed)
  Problem: Safety: Goal: Ability to remain free from injury will improve Outcome: Progressing   

## 2023-01-01 NOTE — Progress Notes (Signed)
PROGRESS NOTE    Tanaya Dunigan  QIO:[id number] DOB: [birth year] DOA: 12/28/2022 PCP: Kirstie Peri, MD   No chief complaint on file.   Brief Narrative:   Lisa [name] is a 77 y.o. female with medical history significant for COPD, tobacco abuse, hypertension, and coronary artery disease with history of drug-eluting stent 6 years ago.  She developed acute onset abdominal pain at approximately 4:00 this morning.  She was up having some coffee when the pain started.  She moved around and did everything she could but the pain just got worse as the hours went by.  Eventually she presented to an outside emergency department.  Her workup included a CT scan of the abdomen pelvis which revealed the renal infarct which involved greater than 50% of her left kidney.  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  The patient was started on IV heparin and was transferred here.  She continues to have pain but denies fevers or chills or chest pain or nausea or vomiting or diarrhea.   Assessment & Plan:   Principal Problem:   Renal infarction [facility]) Active Problems:   COPD (chronic obstructive pulmonary disease) (HCC)  Left renal infarct  -This most likely due to thromboembolic/atheroembolic event, no clear etiology so far, continue with heparin GTT over the next 24 hours, likely will transition to DOAC as an outpatient for 81-month discussed with vascular surgery -echo with no acute finding or concern for thrombosis  -Likely she will need 30 days event monitor as an outpatient to evaluate for source of thrombosis -As discussed with vascular surgery, renal duplex confirmed that left renal artery is patent without flow-limiting stenosis. -Heparin Gtt. initially, she tolerated pretty well, transition to Eliquis 11/2 -Developed significant epistaxis to Plavix in the past.  AKI -Baseline creatinine 0.65, peaked at 1.1, avoid nephrotoxic medications, continue to monitor closely as on IV  diuresis  PAD -vascular surgery input greatly appreciated  COPD - on scheduled DuoNebs.  Hypertension -I will hold her metoprolol given overall her blood pressure remains soft  -As well continue to hold her ACE inhibitor in the setting of worsening creatinine  -He is on low-dose midodrine to avoid low blood pressure especially now she is on diuresis, I will DC her midodrine today given improved blood pressure  Hypophosphatemia -Repleted  Acute on chronic diastolic CHF Acute hypoxic respiratory failure -Remains with significant oxygen requirement, up to 6 L oxygen, BNP is trending up, evidence of volume overload -will start on Lasix, will dose daily given low blood pressure and AKI  -CT chest significant for emphysema, compressive atelectasis bronchial airway thickening, she was encouraged use incentive spirometry, flutter valve, SLP consult appreciated, no evidence of aspiration -Continue with IV diuresis, she received 40 mg of IV Lasix yesterday, will increase this to 60 mg total today (40 mg in the morning and 20 mg in the evening).  Transaminitis -continue to trend, improving.  Right eye herpes simplex dendritic keratitis -Ophthalmology input greatly appreciated, continue with ofloxacin ophthalmic, Valtrex, continue with antiviral eyedrop (ganciclovir changed to Viroptic due to availability)    DVT prophylaxis: Heparin GTT Code Status: full Family Communication: D/W daughter at bedside Disposition:   Status is: Inpatient    Consultants:  Vascular Surgery   Subjective:  Patient denies any chest pain, reports dyspnea still present, right eye pain has improved  Objective: Vitals:   01/01/23 0010 01/01/23 0234 01/01/23 0353 01/01/23 0848  BP: 120/62  (!) 119/56 137/61  Pulse: 72   82  Resp: Marland Kitchen)  22   18  Temp: 97.7 F (36.5 C)  97.8 F (36.6 C)   TempSrc: Oral  Oral   SpO2: 93% 90%  92%  Weight:      Height:       No intake or output data in the 24 hours  ending 01/01/23 1420  Filed Weights   12/28/22 1956  Weight: 84.7 kg    Examination:  Awake Alert, Oriented X 3, No new F.N deficits, Normal affect Symmetrical Chest wall movement, Good air movement bilaterally RRR,No Gallops,Rubs or new Murmurs, No Parasternal Heave +ve B.Sounds, Abd Soft, No tenderness, No rebound - guarding or rigidity. No Cyanosis, Clubbing or edema, No new Rash or bruise       Data Reviewed: I have personally reviewed following labs and imaging studies  CBC: Recent Labs  Lab 12/29/22 0606 12/30/22 0320 12/31/22 0441 01/01/23 0731  WBC 8.5 11.1* 10.5 8.9  HGB 13.5 11.5* 12.3 11.3*  HCT 43.3 37.5 37.9 35.5*  MCV 93.3 93.1 91.8 91.5  PLT 163 139* 139* PLATELET CLUMPS NOTED ON SMEAR, COUNT APPEARS ADEQUATE    Basic Metabolic Panel: Recent Labs  Lab 12/29/22 0606 12/30/22 0320 12/31/22 0441 01/01/23 0445  NA 135 134* 137 141  K 4.0 3.6 3.7 4.2  CL 100 100 105 105  CO2 27 25 25 27   GLUCOSE 139* 142* 198* 123*  BUN 10 11 10 21   CREATININE 1.09* 1.16* 1.07* 1.07*  CALCIUM 8.7* 7.9* 8.8* 8.9  MG  --   --   --  1.8  PHOS  --   --   --  2.3*    GFR: Estimated Creatinine Clearance: 45.8 mL/min (A) (by C-G formula based on SCr of 1.07 mg/dL (H)).  Liver Function Tests: Recent Labs  Lab 12/29/22 0606 12/30/22 1016 12/31/22 0441  AST 131* 48* 28  ALT 114* 80* 65*  ALKPHOS 103 87 92  BILITOT 0.9 1.2 0.5  PROT 6.3* 6.4* 6.5  ALBUMIN 3.0* 2.8* 2.6*    CBG: No results for input(s): "GLUCAP" in the last 168 hours.   No results found for this or any previous visit (from the past 240 hour(s)).       Radiology Studies: VAS [place] ABI WITH/WO TBI  Result Date: 12/31/2022  LOWER EXTREMITY DOPPLER STUDY Patient Name:  DANDREA MEDDERS  Date of Exam:   12/30/2022 Medical Rec #: [id number]         Accession #:    [id number] Date of Birth: 1945/07/19         Patient Gender: F Patient Age:   77 years Exam Location:  [facility] Procedure:       VAS [place] ABI WITH/WO TBI Referring Phys: Makyia Erxleben [facility] --------------------------------------------------------------------------------  Indications: Claudication. High Risk Factors: Hypertension, hyperlipidemia, current smoker, coronary artery                    disease.  Comparison Study: No priors. Performing Technologist: Fernande Bras  Examination Guidelines: A complete evaluation includes at minimum, Doppler waveform signals and systolic blood pressure reading at the level of bilateral brachial, anterior tibial, and posterior tibial arteries, when vessel segments are accessible. Bilateral testing is considered an integral part of a complete examination. Photoelectric Plethysmograph (PPG) waveforms and toe systolic pressure readings are included as required and additional duplex testing as needed. Limited examinations for reoccurring indications may be performed as noted.  ABI Findings: +---------+------------------+-----+---------+--------+ Right    Rt Pressure (mmHg)IndexWaveform Comment  +---------+------------------+-----+---------+--------+ Brachial 118  triphasic         +---------+------------------+-----+---------+--------+ PTA      81                0.69 biphasic          +---------+------------------+-----+---------+--------+ DP       83                0.70 biphasic          +---------+------------------+-----+---------+--------+ Great Toe57                0.48                   +---------+------------------+-----+---------+--------+ +---------+------------------+-----+---------+-------+ Left     Lt Pressure (mmHg)IndexWaveform Comment +---------+------------------+-----+---------+-------+ Brachial 116                    triphasic        +---------+------------------+-----+---------+-------+ PTA      81                0.69 biphasic         +---------+------------------+-----+---------+-------+ DP       87                0.74  biphasic         +---------+------------------+-----+---------+-------+ Great Toe58                0.49                  +---------+------------------+-----+---------+-------+ +-------+-----------+-----------+------------+------------+ ABI/TBIToday's ABIToday's TBIPrevious ABIPrevious TBI +-------+-----------+-----------+------------+------------+ Right  0.70       0.48                                +-------+-----------+-----------+------------+------------+ Left   0.74       0.49                                +-------+-----------+-----------+------------+------------+  Summary: Right: Resting right ankle-brachial index indicates moderate right lower extremity arterial disease. The right toe-brachial index is abnormal. Left: Resting left ankle-brachial index indicates moderate left lower extremity arterial disease. The left toe-brachial index is abnormal. *See table(s) above for measurements and observations.  Electronically signed by Coral Else MD on 12/31/2022 at 10:52:07 AM.    Final         Scheduled Meds:  apixaban  5 mg Oral BID   docusate sodium  100 mg Oral Daily   furosemide  20 mg Intravenous Once   magic mouthwash  5 mL Oral QID   mometasone-formoterol  2 puff Inhalation BID   ofloxacin  1 drop Right Eye QID   pantoprazole  40 mg Oral Daily   phosphorus  500 mg Oral BID   trifluridine  1 drop Right Eye Q2H while awake   valACYclovir  1,000 mg Oral BID   Continuous Infusions:     LOS: 4 days        Huey Bienenstock, MD [facility]   To contact the attending provider between 7A-7P or the covering provider during after hours 7P-7A, please log into the web site [email or web address] and access using universal [place] password for that web site. If you do not have the password, please call the hospital operator.  01/01/2023, 2:20 PM

## 2023-01-02 ENCOUNTER — Encounter (HOSPITAL_COMMUNITY): Payer: Self-pay | Admitting: Internal Medicine

## 2023-01-02 ENCOUNTER — Other Ambulatory Visit: Payer: Self-pay | Admitting: Cardiology

## 2023-01-02 ENCOUNTER — Other Ambulatory Visit (HOSPITAL_COMMUNITY): Payer: Self-pay

## 2023-01-02 ENCOUNTER — Other Ambulatory Visit: Payer: Self-pay

## 2023-01-02 DIAGNOSIS — B0052 Herpesviral keratitis: Secondary | ICD-10-CM | POA: Insufficient documentation

## 2023-01-02 DIAGNOSIS — N280 Ischemia and infarction of kidney: Secondary | ICD-10-CM | POA: Diagnosis not present

## 2023-01-02 LAB — BRAIN NATRIURETIC PEPTIDE: B Natriuretic Peptide: 116.1 pg/mL — ABNORMAL HIGH (ref 0.0–100.0)

## 2023-01-02 LAB — BASIC METABOLIC PANEL
Anion gap: 11 (ref 5–15)
BUN: 20 mg/dL (ref 8–23)
CO2: 29 mmol/L (ref 22–32)
Calcium: 8.8 mg/dL — ABNORMAL LOW (ref 8.9–10.3)
Chloride: 99 mmol/L (ref 98–111)
Creatinine, Ser: 1.09 mg/dL — ABNORMAL HIGH (ref 0.44–1.00)
GFR, Estimated: 52 mL/min — ABNORMAL LOW (ref >60)
Glucose, Bld: 109 mg/dL — ABNORMAL HIGH (ref 70–99)
Potassium: 3.1 mmol/L — ABNORMAL LOW (ref 3.5–5.1)
Sodium: 139 mmol/L (ref 135–145)

## 2023-01-02 LAB — CBC
HCT: 39 % (ref 36.0–46.0)
Hemoglobin: 12.7 g/dL (ref 12.0–15.0)
MCH: 29.7 pg (ref 26.0–34.0)
MCHC: 32.6 g/dL (ref 30.0–36.0)
MCV: 91.1 fL (ref 80.0–100.0)
Platelets: 169 10*3/uL (ref 150–400)
RBC: 4.28 MIL/uL (ref 3.87–5.11)
RDW: 15.9 % — ABNORMAL HIGH (ref 11.5–15.5)
WBC: 6.2 10*3/uL (ref 4.0–10.5)
nRBC: 0 % (ref 0.0–0.2)

## 2023-01-02 LAB — MAGNESIUM: Magnesium: 1.6 mg/dL — ABNORMAL LOW (ref 1.7–2.4)

## 2023-01-02 LAB — PHOSPHORUS: Phosphorus: 4.9 mg/dL — ABNORMAL HIGH (ref 2.5–4.6)

## 2023-01-02 MED ORDER — VALACYCLOVIR HCL 1 G PO TABS
500.0000 mg | ORAL_TABLET | Freq: Three times a day (TID) | ORAL | 0 refills | Status: AC
  Filled 2023-01-02: qty 15, 10d supply, fill #0

## 2023-01-02 MED ORDER — OFLOXACIN 0.3 % OP SOLN
1.0000 [drp] | Freq: Four times a day (QID) | OPHTHALMIC | 0 refills | Status: AC
  Filled 2023-01-02: qty 5, 7d supply, fill #0

## 2023-01-02 MED ORDER — TRIFLURIDINE 1 % OP SOLN
1.0000 [drp] | OPHTHALMIC | 0 refills | Status: AC
  Filled 2023-01-02: qty 7.5, 17d supply, fill #0

## 2023-01-02 MED ORDER — POTASSIUM CHLORIDE 10 MEQ/100ML IV SOLN
10.0000 meq | INTRAVENOUS | Status: AC
  Administered 2023-01-02: 10 meq via INTRAVENOUS
  Filled 2023-01-02 (×2): qty 100

## 2023-01-02 MED ORDER — LISINOPRIL 20 MG PO TABS: 20.0000 mg | ORAL_TABLET | Freq: Every day | ORAL | Status: DC

## 2023-01-02 MED ORDER — OXYCODONE HCL 5 MG PO TABS
2.5000 mg | ORAL_TABLET | Freq: Four times a day (QID) | ORAL | 0 refills | Status: AC | PRN
  Filled 2023-01-02: qty 10, 5d supply, fill #0

## 2023-01-02 MED ORDER — APIXABAN 5 MG PO TABS
5.0000 mg | ORAL_TABLET | Freq: Two times a day (BID) | ORAL | 0 refills | Status: DC
  Filled 2023-01-02: qty 60, 30d supply, fill #0

## 2023-01-02 MED ORDER — POTASSIUM CHLORIDE CRYS ER 10 MEQ PO TBCR
20.0000 meq | EXTENDED_RELEASE_TABLET | Freq: Every day | ORAL | 0 refills | Status: DC
  Filled 2023-01-02: qty 6, 3d supply, fill #0

## 2023-01-02 MED ORDER — FUROSEMIDE 10 MG/ML IJ SOLN
40.0000 mg | Freq: Once | INTRAMUSCULAR | Status: AC
  Administered 2023-01-02: 40 mg via INTRAVENOUS
  Filled 2023-01-02: qty 4

## 2023-01-02 MED ORDER — POTASSIUM CHLORIDE CRYS ER 20 MEQ PO TBCR
60.0000 meq | EXTENDED_RELEASE_TABLET | Freq: Once | ORAL | Status: AC
  Administered 2023-01-02: 60 meq via ORAL
  Filled 2023-01-02: qty 3

## 2023-01-02 MED ORDER — MAGNESIUM SULFATE 4 GM/100ML IV SOLN
4.0000 g | Freq: Once | INTRAVENOUS | Status: AC
  Administered 2023-01-02: 4 g via INTRAVENOUS
  Filled 2023-01-02: qty 100

## 2023-01-02 NOTE — Discharge Summary (Signed)
Physician Discharge Summary  Lisa [name] WUJ:[id number] DOB: 25-Apr-1945 DOA: 12/28/2022  PCP: Kirstie Peri, MD  Admit date: 12/28/2022 Discharge date: 01/02/2023  Admitted From: (Home) Disposition:  (Home )  Recommendations for Outpatient Follow-up:  Follow up with PCP in 1-2 weeks Please obtain BMP/CBC in one week To follow with ophthalmology Dr. Vanessa Barbara regarding HSV keratitis, appointment scheduled for next Thursday. 30 days heart monitor has been arranged by [facility] prior to discharge Please continue counseling about tobacco cessation  Home Health: (YES)  Diet recommendation: Heart Healthy  Brief/Interim Summary:  Lisa [name] is a 77 y.o. female with medical history significant for COPD, tobacco abuse, hypertension, and coronary artery disease with history of drug-eluting stent 6 years ago.  She developed acute onset abdominal pain at approximately 4:00 this morning.  She was up having some coffee when the pain started.  She moved around and did everything she could but the pain just got worse as the hours went by.  Eventually she presented to an outside emergency department.  Her workup included a CT scan of the abdomen pelvis which revealed the renal infarct which involved greater than 50% of her left kidney.  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  The patient was started on IV heparin and was transferred here.  She continues to have pain but denies fevers or chills or chest pain or nausea or vomiting or diarrhea.    Left renal infarct  -This most likely due to thromboembolic/atheroembolic event, no clear etiology so far, he was kept on heparin GTT initially for a few days, then she was transitioned to Eliquis, as I have discussed with vascular surgery, recommendation for 50-month of Eliquis, then to transition back to aspirin . -Cardioembolic event has been negative, 2D echo with no evidence of thrombosis, preserved EF, no arrhythmias or A-fib on telemetry, her finding  felt mainly due to atheroembolic disease, will still arrange for 30 days heart monitor as an outpatient. -LDL is elevated at 74, but she has been intolerant to multiple statin in the past, and daughter reports she had some dyspneic events, so at this point will not challenge her anymore. -As discussed with vascular surgery, renal duplex confirmed that left renal artery is patent without flow-limiting stenosis. -She developed significant epistaxis to Plavix in the past(few years ago).   AKI -Baseline creatinine 0.65, peaked at 1.1, avoid nephrotoxic medications, creatinine remained stable at 1.1 at time of discharge   PAD -vascular surgery input greatly appreciated, no critical stenosis, she is already on Eliquis, further workup can be pursued as an outpatient   COPD, not in exacerbation -Continue home medication  Right eye herpes simplex dendritic keratitis -He has been complaining of sharp pain in the right eye, ophthalmology Dr. Vanessa Barbara has been consulted, ophthalmology input greatly appreciated, continue with ofloxacin ophthalmic, Valtrex) to finish total of 14 days,, continue with antiviral eyedrop (ganciclovir changed to Viroptic due to availability), follow-up with Dr. Vanessa Barbara has been arranged this coming Thursday upon my discussion with him today upon discharge.     Hypertension -Blood pressure has been soft initially on presentation, as well she did require low-dose midodrine, but this has resolved, no midodrine over last 24 hours, actually her blood pressure started to increase and elevated this morning, so she is resumed on her home dose metoprolol today, and she is to resume her home dose ACE inhibitor in 3 days.  Hypophosphatemia Hypokalemia -Repleted   Acute on chronic diastolic CHF Acute hypoxic respiratory failure -Patient with significant  oxygen requirement, up to 6 L, BNP is elevated, imaging significant for volume overload, she required aggressive IV diuresis after initial  IV hydration, volume status much improved, she is euvolemic at time of discharge, no further hypoxia, with no oxygen requirement, she was encouraged to keep using incentive spirometer, flutter valve as an outpatient, and to stay on Lasix on a as needed basis.   -CT chest significant for emphysema, compressive atelectasis bronchial airway thickening, she was encouraged use incentive spirometry, flutter valve, SLP consult appreciated, no evidence of aspiration    Transaminitis - improving.     Discharge Diagnoses:  Principal Problem:   Renal infarction [facility]) Active Problems:   GERD (gastroesophageal reflux disease)   COPD (chronic obstructive pulmonary disease) (HCC)   Tobacco abuse   HSV (herpes simplex virus) dendritic keratitis    Discharge Instructions  Discharge Instructions     Diet - low sodium heart healthy   Complete by: As directed    Discharge instructions   Complete by: As directed    Follow with Primary MD Kirstie Peri, MD in 7 days   Get CBC, CMP, checked  by Primary MD next visit.    Activity: As tolerated with Full fall precautions use walker/cane & assistance as needed   Disposition Home    Diet: Heart Healthy    On your next visit with your primary care physician please Get Medicines reviewed and adjusted.   Please request your Prim.MD to go over all Hospital Tests and Procedure/Radiological results at the follow up, please get all Hospital records sent to your Prim MD by signing hospital release before you go home.   If you experience worsening of your admission symptoms, develop shortness of breath, life threatening emergency, suicidal or homicidal thoughts you must seek medical attention immediately by calling 911 or calling your MD immediately  if symptoms less severe.  You Must read complete instructions/literature along with all the possible adverse reactions/side effects for all the Medicines you take and that have been prescribed to you. Take  any new Medicines after you have completely understood and accpet all the possible adverse reactions/side effects.   Do not drive, operating heavy machinery, perform activities at heights, swimming or participation in water activities or provide baby sitting services if your were admitted for syncope or siezures until you have seen by Primary MD or a Neurologist and advised to do so again.  Do not drive when taking Pain medications.    Do not take more than prescribed Pain, Sleep and Anxiety Medications  Special Instructions: If you have smoked or chewed Tobacco  in the last 2 yrs please stop smoking, stop any regular Alcohol  and or any Recreational drug use.  Wear Seat belts while driving.   Please note  You were cared for by a hospitalist during your hospital stay. If you have any questions about your discharge medications or the care you received while you were in the hospital after you are discharged, you can call the unit and asked to speak with the hospitalist on call if the hospitalist that took care of you is not available. Once you are discharged, your primary care physician will handle any further medical issues. Please note that NO REFILLS for any discharge medications will be authorized once you are discharged, as it is imperative that you return to your primary care physician (or establish a relationship with a primary care physician if you do not have one) for your aftercare needs so that they  can reassess your need for medications and monitor your lab values.   Increase activity slowly   Complete by: As directed       Allergies as of 01/02/2023       Reactions   Brilinta [ticagrelor] Shortness Of Breath   Penicillins Anaphylaxis   Has patient had a PCN reaction causing immediate rash, facial/tongue/throat swelling, SOB or lightheadedness with hypotension: Yes Has patient had a PCN reaction causing severe rash involving mucus membranes or skin necrosis: yes Has patient had  a PCN reaction that required hospitalization: no Has patient had a PCN reaction occurring within the last 10 years: no If all of the above answers are "NO", then may proceed with Cephalosporin use.   Ranexa [ranolazine] Shortness Of Breath   Sulfa Antibiotics Anaphylaxis, Rash   Blistering rash on hands and feet   Crestor [rosuvastatin] Other (See Comments)   Myalgias        Medication List     STOP taking these medications    aspirin EC 81 MG tablet       TAKE these medications    acetaminophen 500 MG tablet Commonly known as: TYLENOL Take 1,000 mg by mouth every 6 (six) hours as needed for moderate pain.   apixaban 5 MG Tabs tablet Commonly known as: ELIQUIS Take 1 tablet (5 mg total) by mouth 2 (two) times daily.   CoQ10 100 MG Caps Take 100 mg by mouth daily.   diazepam 2 MG tablet Commonly known as: VALIUM Take 2 mg by mouth daily as needed for anxiety.   docusate sodium 100 MG capsule Commonly known as: COLACE Take 100 mg by mouth daily.   esomeprazole 20 MG capsule Commonly known as: NEXIUM Take 20 mg by mouth daily at 12 noon.   fluocinonide ointment 0.05 % Commonly known as: LIDEX Apply 1 Application topically 2 (two) times daily.   fluticasone-salmeterol 115-21 MCG/ACT inhaler Commonly known as: ADVAIR HFA Inhale 2 puffs into the lungs at bedtime.   furosemide 20 MG tablet Commonly known as: LASIX Take 20 mg by mouth daily as needed.   lisinopril 20 MG tablet Commonly known as: ZESTRIL Take 1 tablet (20 mg total) by mouth daily. Start taking on: January 06, 2023 What changed: These instructions start on January 06, 2023. If you are unsure what to do until then, ask your doctor or other care provider.   metoprolol succinate 25 MG 24 hr tablet Commonly known as: Toprol XL Take 0.5 tablets (12.5 mg total) by mouth 2 (two) times daily. What changed:  how much to take when to take this   MOVE FREE PO Take 1 tablet by mouth daily.    nitroGLYCERIN 0.4 MG SL tablet Commonly known as: NITROSTAT Place 1 tablet (0.4 mg total) under the tongue every 5 (five) minutes as needed for chest pain.   ofloxacin 0.3 % ophthalmic solution Commonly known as: OCUFLOX Place 1 drop into the right eye 4 (four) times daily for 7 days.   oxyCODONE 5 MG immediate release tablet Commonly known as: Oxy IR/ROXICODONE Take 0.5 tablets (2.5 mg total) by mouth every 6 (six) hours as needed for up to 5 days for moderate pain (pain score 4-6) or breakthrough pain.   potassium chloride 10 MEQ tablet Commonly known as: KLOR-CON M Take 10 mEq by mouth as needed. What changed: Another medication with the same name was added. Make sure you understand how and when to take each.   potassium chloride 10 MEQ tablet Commonly  known as: KLOR-CON M Take 2 tablets (20 mEq total) by mouth daily for 3 days. What changed: You were already taking a medication with the same name, and this prescription was added. Make sure you understand how and when to take each.   PROBIOTIC PO Take 1 capsule by mouth daily.   trifluridine 1 % ophthalmic solution Commonly known as: VIROPTIC Place 1 drop into the right eye every 2 (two) hours while awake for 10 days.   valACYclovir 1000 MG tablet Commonly known as: VALTREX Take 0.5 tablets (500 mg total) by mouth 3 (three) times daily for 10 days.   VITAMIN B-12 PO Take 1 tablet by mouth daily.   Vitamin D3 125 MCG (5000 UT) Caps Take 5,000 Units by mouth daily.               Durable Medical Equipment  (From admission, onward)           Start     Ordered   01/02/23 1131  For home use only DME Walker rolling  Once       Question Answer Comment  Walker: With 5 Inch Wheels   Patient needs a walker to treat with the following condition Renal infarction (HCC)      01/02/23 1130            Follow-up Information     Rennis Chris, MD Follow up on 01/05/2023.   Specialty: Ophthalmology Why: at 8:30  am Contact information: [address] [place] [address] [phone]                Allergies  Allergen Reactions   Brilinta [Ticagrelor] Shortness Of Breath   Penicillins Anaphylaxis    Has patient had a PCN reaction causing immediate rash, facial/tongue/throat swelling, SOB or lightheadedness with hypotension: Yes Has patient had a PCN reaction causing severe rash involving mucus membranes or skin necrosis: yes Has patient had a PCN reaction that required hospitalization: no Has patient had a PCN reaction occurring within the last 10 years: no If all of the above answers are "NO", then may proceed with Cephalosporin use.    Ranexa [Ranolazine] Shortness Of Breath   Sulfa Antibiotics Anaphylaxis and Rash    Blistering rash on hands and feet   Crestor [Rosuvastatin] Other (See Comments)    Myalgias    Consultations: Ophthalmology   Procedures/Studies: VAS [place] ABI WITH/WO TBI  Result Date: 12/31/2022  LOWER EXTREMITY DOPPLER STUDY Patient Name:  KOHANA AMBLE  Date of Exam:   12/30/2022 Medical Rec #: [id number]         Accession #:    [id number] Date of Birth: 1945/12/01         Patient Gender: F Patient Age:   67 years Exam Location:  [facility] Procedure:      VAS [place] ABI WITH/WO TBI Referring Phys: Keishawn Rajewski [facility] --------------------------------------------------------------------------------  Indications: Claudication. High Risk Factors: Hypertension, hyperlipidemia, current smoker, coronary artery                    disease.  Comparison Study: No priors. Performing Technologist: Fernande Bras  Examination Guidelines: A complete evaluation includes at minimum, Doppler waveform signals and systolic blood pressure reading at the level of bilateral brachial, anterior tibial, and posterior tibial arteries, when vessel segments are accessible. Bilateral testing is considered an integral part of a complete examination. Photoelectric Plethysmograph  (PPG) waveforms and toe systolic pressure readings are included as required and additional duplex  testing as needed. Limited examinations for reoccurring indications may be performed as noted.  ABI Findings: +---------+------------------+-----+---------+--------+ Right    Rt Pressure (mmHg)IndexWaveform Comment  +---------+------------------+-----+---------+--------+ Brachial 118                    triphasic         +---------+------------------+-----+---------+--------+ PTA      81                0.69 biphasic          +---------+------------------+-----+---------+--------+ DP       83                0.70 biphasic          +---------+------------------+-----+---------+--------+ Great Toe57                0.48                   +---------+------------------+-----+---------+--------+ +---------+------------------+-----+---------+-------+ Left     Lt Pressure (mmHg)IndexWaveform Comment +---------+------------------+-----+---------+-------+ Brachial 116                    triphasic        +---------+------------------+-----+---------+-------+ PTA      81                0.69 biphasic         +---------+------------------+-----+---------+-------+ DP       87                0.74 biphasic         +---------+------------------+-----+---------+-------+ Great Toe58                0.49                  +---------+------------------+-----+---------+-------+ +-------+-----------+-----------+------------+------------+ ABI/TBIToday's ABIToday's TBIPrevious ABIPrevious TBI +-------+-----------+-----------+------------+------------+ Right  0.70       0.48                                +-------+-----------+-----------+------------+------------+ Left   0.74       0.49                                +-------+-----------+-----------+------------+------------+  Summary: Right: Resting right ankle-brachial index indicates moderate right lower extremity  arterial disease. The right toe-brachial index is abnormal. Left: Resting left ankle-brachial index indicates moderate left lower extremity arterial disease. The left toe-brachial index is abnormal. *See table(s) above for measurements and observations.  Electronically signed by Coral Else MD on 12/31/2022 at 10:52:07 AM.    Final    CT CHEST WO CONTRAST  Result Date: 12/30/2022 CLINICAL DATA:  Abnormal x-ray with lung opacity. Hypoxia. Rales in the lung base. EXAM: CT CHEST WITHOUT CONTRAST TECHNIQUE: Multidetector CT imaging of the chest was performed following the standard protocol without IV contrast. RADIATION DOSE REDUCTION: This exam was performed according to the departmental dose-optimization program which includes automated exposure control, adjustment of the mA and/or kV according to patient size and/or use of iterative reconstruction technique. COMPARISON:  Chest radiograph 12/30/2022.  CT chest 07/22/2022 FINDINGS: Cardiovascular: Normal heart size. No pericardial effusions. Normal caliber thoracic aorta. Calcification of the aorta and coronary arteries. Mediastinum/Nodes: Esophagus is decompressed. Lymph nodes are not pathologically enlarged. Thyroid gland is unremarkable. Lungs/Pleura: Emphysematous changes throughout the lungs with scattered fibrosis. Linear infiltration or atelectasis demonstrated in both  lung bases, mildly progressed since prior study. This could represent pneumonia or compressive atelectasis. Bronchial wall thickening with mucous plugging or secretions in the lower lobe bronchi peripherally. No pleural effusions. No pneumothorax. Upper Abdomen: Increased density throughout the gallbladder likely representing milk of calcium, tiny stones, or sludge. No wall thickening or inflammatory changes. A 3 mm stone is demonstrated in the left kidney without obvious hydronephrosis. Musculoskeletal: Degenerative changes in the spine. No focal bone lesions. IMPRESSION: 1. Emphysematous  changes and scattered fibrosis in the lungs. 2. Linear atelectasis or infiltration in both lung bases with mild progression since prior study. This could represent pneumonia or compressive atelectasis. 3. Bronchial wall thickening with mucous plugging or secretions in the lower lobe bronchi peripherally. This could indicate airways disease or possibly aspiration. 4. Diffusely increased density in the gallbladder, likely representing milk of calcium, tiny stones, or sludge. 5. 3 mm nonobstructing stone in the left kidney. Electronically Signed   By: Burman Nieves M.D.   On: 12/30/2022 15:05   DG CHEST PORT 1 VIEW  Result Date: 12/30/2022 CLINICAL DATA:  Shortness of breath. EXAM: PORTABLE CHEST 1 VIEW COMPARISON:  Jul 22, 2022. FINDINGS: Stable cardiomediastinal silhouette. Increased interstitial densities are noted in both lung bases concerning for possible pulmonary edema, right greater than left. Bony thorax is unremarkable. IMPRESSION: Increased interstitial densities are noted in both lung bases, right greater than left, concerning for pulmonary edema. Electronically Signed   By: Lupita Raider M.D.   On: 12/30/2022 11:52   VAS US RENAL ARTERY DUPLEX  Result Date: 12/29/2022 ABDOMINAL VISCERAL Patient Name:  ARAMINTA ZORN  Date of Exam:   12/29/2022 Medical Rec #: [id number]         Accession #:    [id number] Date of Birth: August 30, 1945         Patient Gender: F Patient Age:   77 years Exam Location:  [facility] Procedure:      VAS US RENAL ARTERY DUPLEX Referring Phys: [id number] Cephus Shelling -------------------------------------------------------------------------------- Indications: 50% renal infarction of the left kidney on CT scan at [facility]. High Risk Factors: Hypertension, coronary artery disease. Other Factors: GERD, COPD, tobacco abuse. Comparison Study: No priors. Performing Technologist: Marilynne Halsted RDMS, RVT  Examination Guidelines: A complete  evaluation includes B-mode imaging, spectral Doppler, color Doppler, and power Doppler as needed of all accessible portions of each vessel. Bilateral testing is considered an integral part of a complete examination. Limited examinations for reoccurring indications may be performed as noted.  Duplex Findings: +--------------------+--------+--------+------+--------------+ Mesenteric          PSV cm/sEDV cm/sPlaque   Comments    +--------------------+--------+--------+------+--------------+ Aorta Prox             67                                +--------------------+--------+--------+------+--------------+ Celiac Artery Origin                      not visualized +--------------------+--------+--------+------+--------------+ SMA Origin                                not visualized +--------------------+--------+--------+------+--------------+    +------------------+--------+--------+-------+ Right Renal ArteryPSV cm/sEDV cm/sComment +------------------+--------+--------+-------+ Origin  155      39           +------------------+--------+--------+-------+ Proximal            158      35           +------------------+--------+--------+-------+ Mid                 135      36           +------------------+--------+--------+-------+ Distal               89      26           +------------------+--------+--------+-------+ +-----------------+--------+--------+-------+ Left Renal ArteryPSV cm/sEDV cm/sComment +-----------------+--------+--------+-------+ Origin             140      31           +-----------------+--------+--------+-------+ Proximal           142      28           +-----------------+--------+--------+-------+ Mid                143      24           +-----------------+--------+--------+-------+ Distal              87      17           +-----------------+--------+--------+-------+  +------------+--------+--------+----+-----------+--------+--------+----+ Right KidneyPSV cm/sEDV cm/sRI  Left KidneyPSV cm/sEDV cm/sRI   +------------+--------+--------+----+-----------+--------+--------+----+ Upper Pole  35      12      0.66Upper Pole 33      10      0.69 +------------+--------+--------+----+-----------+--------+--------+----+ Mid         49      14      0.        45      15      0.67 +------------+--------+--------+----+-----------+--------+--------+----+ Lower Pole  53      17      0.68Lower Pole 34      11      0.69 +------------+--------+--------+----+-----------+--------+--------+----+ Hilar       65      16      0.76Hilar      47      12      0.75 +------------+--------+--------+----+-----------+--------+--------+----+ +------------------+-----+------------------+-----+ Right Kidney           Left Kidney             +------------------+-----+------------------+-----+ RAR                    RAR                     +------------------+-----+------------------+-----+ RAR (manual)      2.3  RAR (manual)      2.1   +------------------+-----+------------------+-----+ Cortex                 Cortex                  +------------------+-----+------------------+-----+ Cortex thickness       Corex thickness         +------------------+-----+------------------+-----+ Kidney length (cm)11.94Kidney length (cm)13.28 +------------------+-----+------------------+-----+  Summary: Renal:  Right: Patent renal artery without significant stenosis noted. The        resisitive index in the mid pole appears slightly elevated.        Normal size right kidney. Left:  Patent renal artery without significant stenosis noted. The        resisitive index appears within normal limits. Left kidney        appears larger in size when compared to the right kidney.  *See table(s) above for measurements and observations.  Diagnosing physician: Heath Lark  Electronically signed by Heath Lark on 12/29/2022 at 4:08:08 PM.    Final    ECHOCARDIOGRAM COMPLETE  Result Date: 12/29/2022    ECHOCARDIOGRAM REPORT   Patient Name:   Brexlee Heberlein Date of Exam: 12/29/2022 Medical Rec #:  [id number]        Height:       65.0 in Accession #:    [id number]       Weight:       186.7 lb Date of Birth:  Apr 11, 1945        BSA:          1.921 m Patient Age:    77 years         BP:           118/66 mmHg Patient Gender: F                HR:           76 bpm. Exam Location:  Inpatient Procedure: 2D Echo, Cardiac Doppler and Color Doppler Indications:    Stroke  History:        Patient has prior history of Echocardiogram examinations, most                 recent 09/02/2016. Risk Factors:Family History of Coronary Artery                 Disease and Current Smoker.  Sonographer:    Karma Ganja Referring Phys: Buena Irish  Sonographer Comments: Image acquisition challenging due to COPD. IMPRESSIONS  1. Left ventricular ejection fraction, by estimation, is 60 to 65%. The left ventricle has normal function. The left ventricle has no regional wall motion abnormalities. There is moderate left ventricular hypertrophy. Left ventricular diastolic parameters are consistent with Grade II diastolic dysfunction (pseudonormalization).  2. Right ventricular systolic function is normal. The right ventricular size is normal.  3. The mitral valve is grossly normal. No evidence of mitral valve regurgitation.  4. The aortic valve is grossly normal. Aortic valve regurgitation is not visualized.  5. The inferior vena cava is normal in size with greater than 50% respiratory variability, suggesting right atrial pressure of 3 mmHg. FINDINGS  Left Ventricle: Left ventricular ejection fraction, by estimation, is 60 to 65%. The left ventricle has normal function. The left ventricle has no regional wall motion abnormalities. The left ventricular internal cavity size was normal in size. There is   moderate left ventricular hypertrophy. Left ventricular diastolic parameters are consistent with Grade II diastolic dysfunction (pseudonormalization). Right Ventricle: The right ventricular size is normal. Right ventricular systolic function is normal. Left Atrium: Left atrial size was normal in size. Right Atrium: Right atrial size was normal in size. Pericardium: There is no evidence of pericardial effusion. Mitral Valve: The mitral valve is grossly normal. No evidence of mitral valve regurgitation. Tricuspid Valve: Tricuspid valve regurgitation is not demonstrated. Aortic Valve: The aortic valve is grossly normal. Aortic valve regurgitation is not visualized. Aortic valve mean gradient measures 6.0 mmHg. Aortic valve peak gradient measures 11.3 mmHg. Aortic valve area, by VTI measures 2.82 cm. Pulmonic Valve: Pulmonic valve regurgitation is not visualized. Aorta: The aortic root and ascending  aorta are structurally normal, with no evidence of dilitation. Venous: The inferior vena cava is normal in size with greater than 50% respiratory variability, suggesting right atrial pressure of 3 mmHg. IAS/Shunts: The interatrial septum was not well visualized.  LEFT VENTRICLE PLAX 2D LVIDd:         4.20 cm   Diastology LVIDs:         2.50 cm   LV e' medial:    5.87 cm/s LV PW:         1.30 cm   LV E/e' medial:  19.9 LV IVS:        1.30 cm   LV e' lateral:   8.59 cm/s LVOT diam:     2.00 cm   LV E/e' lateral: 13.6 LV SV:         72 LV SV Index:   38 LVOT Area:     3.14 cm  RIGHT VENTRICLE             IVC RV Basal diam:  3.40 cm     IVC diam: 1.80 cm RV S prime:     15.60 cm/s TAPSE (M-mode): 3.1 cm LEFT ATRIUM             Index        RIGHT ATRIUM           Index LA diam:        4.00 cm 2.08 cm/m   RA Area:     13.50 cm LA Vol (A2C):   63.7 ml 33.16 ml/m  RA Volume:   25.70 ml  13.38 ml/m LA Vol (A4C):   31.3 ml 16.29 ml/m LA Biplane Vol: 47.9 ml 24.93 ml/m  AORTIC VALVE AV Area (Vmax):    2.51 cm AV Area (Vmean):    2.41 cm AV Area (VTI):     2.82 cm AV Vmax:           168.00 cm/s AV Vmean:          111.000 cm/s AV VTI:            0.256 m AV Peak Grad:      11.3 mmHg AV Mean Grad:      6.0 mmHg LVOT Vmax:         134.00 cm/s LVOT Vmean:        85.300 cm/s LVOT VTI:          0.230 m LVOT/AV VTI ratio: 0.90  AORTA Ao Root diam: 2.90 cm Ao Asc diam:  2.80 cm MITRAL VALVE MV Area (PHT): 3.85 cm     SHUNTS MV Decel Time: 197 msec     Systemic VTI:  0.23 m MV E velocity: 117.00 cm/s  Systemic Diam: 2.00 cm MV A velocity: 90.00 cm/s MV E/A ratio:  1.30 Photographer signed by Carolan Clines Signature Date/Time: 12/29/2022/2:35:08 PM    Final      Subjective: He had a good night sleep, denies dyspnea, cough, reports right ocular pain much improved.  Discharge Exam: Vitals:   01/02/23 0820 01/02/23 0831  BP: 129/71   Pulse:    Resp: 18   Temp: 98 F (36.7 C)   SpO2: 92% 92%   Vitals:   01/02/23 0000 01/02/23 0400 01/02/23 0820 01/02/23 0831  BP: 115/70 122/75 129/71   Pulse: 93 90    Resp:   18   Temp: 98.9 F (37.2 C) 98.8 F (37.1 C) 98 F (36.7 C)   TempSrc: Oral  Oral Oral   SpO2: 92% 91% 92% 92%  Weight:      Height:        General: Pt is alert, awake, not in acute distress Cardiovascular: RRR, S1/S2 +, no rubs, no gallops Respiratory: CTA bilaterally, no wheezing, no rhonchi Abdominal: Soft, NT, ND, bowel sounds + Extremities: no edema, no cyanosis    The results of significant diagnostics from this hospitalization (including imaging, microbiology, ancillary and laboratory) are listed below for reference.     Microbiology: No results found for this or any previous visit (from the past 240 hour(s)).   Labs: BNP (last 3 results) Recent Labs    12/30/22 1016 12/31/22 0441 01/02/23 0328  BNP 134.3* 254.9* 116.1*   Basic Metabolic Panel: Recent Labs  Lab 12/29/22 0606 12/30/22 0320 12/31/22 0441 01/01/23 0445 01/02/23 0328  NA 135 134* 137 141 139  K 4.0 3.6  3.7 4.2 3.1*  CL 100 100 105 105 99  CO2 27 25 25 27 29   GLUCOSE 139* 142* 198* 123* 109*  BUN 10 11 10 21 20   CREATININE 1.09* 1.16* 1.07* 1.07* 1.09*  CALCIUM 8.7* 7.9* 8.8* 8.9 8.8*  MG  --   --   --  1.8 1.6*  PHOS  --   --   --  2.3* 4.9*   Liver Function Tests: Recent Labs  Lab 12/29/22 0606 12/30/22 1016 12/31/22 0441  AST 131* 48* 28  ALT 114* 80* 65*  ALKPHOS 103 87 92  BILITOT 0.9 1.2 0.5  PROT 6.3* 6.4* 6.5  ALBUMIN 3.0* 2.8* 2.6*   No results for input(s): "LIPASE", "AMYLASE" in the last 168 hours. No results for input(s): "AMMONIA" in the last 168 hours. CBC: Recent Labs  Lab 12/29/22 0606 12/30/22 0320 12/31/22 0441 01/01/23 0731 01/02/23 0328  WBC 8.5 11.1* 10.5 8.9 6.2  HGB 13.5 11.5* 12.3 11.3* 12.7  HCT 43.3 37.5 37.9 35.5* 39.0  MCV 93.3 93.1 91.8 91.5 91.1  PLT 163 139* 139* PLATELET CLUMPS NOTED ON SMEAR, COUNT APPEARS ADEQUATE 169   Cardiac Enzymes: No results for input(s): "CKTOTAL", "CKMB", "CKMBINDEX", "TROPONINI" in the last 168 hours. BNP: Invalid input(s): "POCBNP" CBG: No results for input(s): "GLUCAP" in the last 168 hours. D-Dimer No results for input(s): "DDIMER" in the last 72 hours. Hgb A1c No results for input(s): "HGBA1C" in the last 72 hours. Lipid Profile No results for input(s): "CHOL", "HDL", "LDLCALC", "TRIG", "CHOLHDL", "LDLDIRECT" in the last 72 hours. Thyroid function studies No results for input(s): "TSH", "T4TOTAL", "T3FREE", "THYROIDAB" in the last 72 hours.  Invalid input(s): "FREET3" Anemia work up No results for input(s): "VITAMINB12", "FOLATE", "FERRITIN", "TIBC", "IRON", "RETICCTPCT" in the last 72 hours. Urinalysis    Component Value Date/Time   COLORURINE YELLOW 12/30/2022 0833   APPEARANCEUR HAZY (A) 12/30/2022 0833   LABSPEC 1.012 12/30/2022 0833   PHURINE 5.0 12/30/2022 0833   GLUCOSEU NEGATIVE 12/30/2022 0833   HGBUR SMALL (A) 12/30/2022 0833   BILIRUBINUR NEGATIVE 12/30/2022 0833   KETONESUR  NEGATIVE 12/30/2022 0833   PROTEINUR 100 (A) 12/30/2022 0833   UROBILINOGEN 0.2 09/26/2009 1632   NITRITE NEGATIVE 12/30/2022 0833   LEUKOCYTESUR NEGATIVE 12/30/2022 0833   Sepsis Labs Recent Labs  Lab 12/30/22 0320 12/31/22 0441 01/01/23 0731 01/02/23 0328  WBC 11.1* 10.5 8.9 6.2   Microbiology No results found for this or any previous visit (from the past 240 hour(s)).   Time coordinating discharge: Over 30 minutes  SIGNED:   Huey Bienenstock, MD  [facility] 01/02/2023, 11:54 AM Pager   If 7PM-7AM, please contact night-coverage [email or web address] Password [email or web address]

## 2023-01-02 NOTE — Progress Notes (Signed)
Mobility Specialist Progress Note;   01/02/23 1120  Mobility  Activity Ambulated with assistance in hallway  Level of Assistance Contact guard assist, steadying assist  Assistive Device Other (Comment) (IV pole)  Distance Ambulated (ft) 450 ft  Activity Response Tolerated well  Mobility Referral Yes  $Mobility charge 1 Mobility  Mobility Specialist Start Time (ACUTE ONLY) 1120  Mobility Specialist Stop Time (ACUTE ONLY) 1140  Mobility Specialist Time Calculation (min) (ACUTE ONLY) 20 min   Pt agreeable to mobility. Required minG assistance during ambulation for safety. Requested to use BR during session, void successful. No c/o during ambulation. Pt back in bed with all needs met. Daughter in room.  Caesar Bookman Mobility Specialist Please contact via SecureChat or Rehab Office [phone]

## 2023-01-02 NOTE — Progress Notes (Signed)
Physical Therapy Treatment Patient Details Name: Lisa [name] MRN: [id number] DOB: 10/20/1945 Today's Date: 01/02/2023   History of Present Illness Pt is a 77 y/o F admitted on 12/28/22 after presenting with c/o acute onset of abdominal pain. 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 PMH: COPD, tobacco abuse, HTN, CAD    PT Comments  Pt received in supine and agreeable to session. Pt is able to perform all mobility tasks with up to CGA for safety. Pt able to tolerate gait trial without AD this session demonstrating some unsteadiness, but no overt LOB. Pt defers stair trial and reports feeling confident that she will be able to manage the steps into her house. Pt able to perform 5 serial STS without UE support. Education provided on activity progression and energy conservation. Anticipate pt and family will be able to manage pt's mobility needs at home once medically ready for discharge.    If plan is discharge home, recommend the following: A little help with walking and/or transfers;A little help with bathing/dressing/bathroom;Assistance with cooking/housework;Assist for transportation;Help with stairs or ramp for entrance   Can travel by private vehicle        Equipment Recommendations  Rolling walker (2 wheels);BSC/3in1    Recommendations for Other Services       Precautions / Restrictions Precautions Precautions: Fall Restrictions Weight Bearing Restrictions: No     Mobility  Bed Mobility Overal bed mobility: Modified Independent Bed Mobility: Supine to Sit     Supine to sit: HOB elevated, Modified independent (Device/Increase time)          Transfers Overall transfer level: Needs assistance Equipment used: None Transfers: Sit to/from Stand Sit to Stand: Supervision                Ambulation/Gait Ambulation/Gait assistance: Contact guard assist, Supervision Gait Distance (Feet): 220  Feet Assistive device: None Gait Pattern/deviations: Decreased stride length, Step-through pattern Gait velocity: decreased     General Gait Details: slight unsteadiness without UE support, but no LOB. CGA progressing to supervision for safety      Balance Overall balance assessment: Needs assistance Sitting-balance support: Feet supported Sitting balance-Leahy Scale: Good Sitting balance - Comments: sitting EOB   Standing balance support: During functional activity, No upper extremity supported Standing balance-Leahy Scale: Fair Standing balance comment: without AD                            Cognition Arousal: Alert Behavior During Therapy: WFL for tasks assessed/performed Overall Cognitive Status: Within Functional Limits for tasks assessed                                          Exercises Other Exercises Other Exercises: STS x5 without UE support    General Comments        Pertinent Vitals/Pain Pain Assessment Pain Assessment: No/denies pain     PT Goals (current goals can now be found in the care plan section) Acute Rehab PT Goals Patient Stated Goal: get better, decreased pain PT Goal Formulation: With patient/family Time For Goal Achievement: 01/13/23 Progress towards PT goals: Progressing toward goals    Frequency    Min 1X/week       AM-PAC PT "6 Clicks" Mobility   Outcome Measure  Help needed turning from your back to your  side while in a flat bed without using bedrails?: None Help needed moving from lying on your back to sitting on the side of a flat bed without using bedrails?: None Help needed moving to and from a bed to a chair (including a wheelchair)?: A Little Help needed standing up from a chair using your arms (e.g., wheelchair or bedside chair)?: A Little Help needed to walk in hospital room?: A Little Help needed climbing 3-5 steps with a railing? : A Little 6 Click Score: 20    End of Session    Activity Tolerance: Patient tolerated treatment well;Patient limited by fatigue Patient left: in bed;with family/visitor present;with call bell/phone within reach Nurse Communication: Mobility status PT Visit Diagnosis: Difficulty in walking, not elsewhere classified (R26.2);Muscle weakness (generalized) (M62.81);Pain;Other abnormalities of gait and mobility (R26.89)     Time: 8413-2440 PT Time Calculation (min) (ACUTE ONLY): 9 min  Charges:    $Gait Training: 8-22 mins PT General Charges $$ ACUTE PT VISIT: 1 Visit                     Johny Shock, PTA Acute Rehabilitation Services Secure Chat Preferred  [phone]    Johny Shock 01/02/2023, 12:34 PM

## 2023-01-02 NOTE — Discharge Instructions (Signed)
Follow with Primary MD Monico Blitz, MD in 7 days   Get CBC, CMP,  checked  by Primary MD next visit.    Activity: As tolerated with Full fall precautions use walker/cane & assistance as needed   Disposition Home    Diet: Heart Healthy    On your next visit with your primary care physician please Get Medicines reviewed and adjusted.   Please request your Prim.MD to go over all Hospital Tests and Procedure/Radiological results at the follow up, please get all Hospital records sent to your Prim MD by signing hospital release before you go home.   If you experience worsening of your admission symptoms, develop shortness of breath, life threatening emergency, suicidal or homicidal thoughts you must seek medical attention immediately by calling 911 or calling your MD immediately  if symptoms less severe.  You Must read complete instructions/literature along with all the possible adverse reactions/side effects for all the Medicines you take and that have been prescribed to you. Take any new Medicines after you have completely understood and accpet all the possible adverse reactions/side effects.   Do not drive, operating heavy machinery, perform activities at heights, swimming or participation in water activities or provide baby sitting services if your were admitted for syncope or siezures until you have seen by Primary MD or a Neurologist and advised to do so again.  Do not drive when taking Pain medications.    Do not take more than prescribed Pain, Sleep and Anxiety Medications  Special Instructions: If you have smoked or chewed Tobacco  in the last 2 yrs please stop smoking, stop any regular Alcohol  and or any Recreational drug use.  Wear Seat belts while driving.   Please note  You were cared for by a hospitalist during your hospital stay. If you have any questions about your discharge medications or the care you received while you were in the hospital after you are discharged,  you can call the unit and asked to speak with the hospitalist on call if the hospitalist that took care of you is not available. Once you are discharged, your primary care physician will handle any further medical issues. Please note that NO REFILLS for any discharge medications will be authorized once you are discharged, as it is imperative that you return to your primary care physician (or establish a relationship with a primary care physician if you do not have one) for your aftercare needs so that they can reassess your need for medications and monitor your lab values.

## 2023-01-02 NOTE — Care Management Important Message (Signed)
Important Message  Patient Details  Name: Lisa [name] MRN: [id number] Date of Birth: 1945/12/03   Important Message Given:    PT'S HAD DISCHARGED IM MAILED    Mardene Sayer 01/02/2023, 3:18 PM

## 2023-01-02 NOTE — Plan of Care (Signed)

## 2023-01-02 NOTE — Care Management Important Message (Signed)
Important Message  Patient Details  Name: Lisa [name] MRN: [id number] Date of Birth: 1945/03/08   Important Message Given:  Yes - Medicare IM     Dorena Bodo 01/02/2023, 3:33 PM

## 2023-01-03 ENCOUNTER — Other Ambulatory Visit: Payer: Self-pay | Admitting: Cardiology

## 2023-01-03 DIAGNOSIS — N280 Ischemia and infarction of kidney: Secondary | ICD-10-CM

## 2023-01-03 DIAGNOSIS — I491 Atrial premature depolarization: Secondary | ICD-10-CM

## 2023-01-04 DIAGNOSIS — I10 Essential (primary) hypertension: Secondary | ICD-10-CM | POA: Diagnosis not present

## 2023-01-04 DIAGNOSIS — J441 Chronic obstructive pulmonary disease with (acute) exacerbation: Secondary | ICD-10-CM | POA: Diagnosis not present

## 2023-01-04 DIAGNOSIS — I509 Heart failure, unspecified: Secondary | ICD-10-CM | POA: Diagnosis not present

## 2023-01-04 DIAGNOSIS — Z299 Encounter for prophylactic measures, unspecified: Secondary | ICD-10-CM | POA: Diagnosis not present

## 2023-01-05 ENCOUNTER — Encounter (INDEPENDENT_AMBULATORY_CARE_PROVIDER_SITE_OTHER): Payer: Medicare Other | Admitting: Ophthalmology

## 2023-01-05 ENCOUNTER — Telehealth: Payer: Medicare Other | Admitting: Internal Medicine

## 2023-01-05 DIAGNOSIS — H3581 Retinal edema: Secondary | ICD-10-CM

## 2023-01-05 NOTE — Telephone Encounter (Signed)
Pt is requesting a callback regarding the Cardiac advance monitor that was ordered. Please advise

## 2023-01-05 NOTE — Telephone Encounter (Signed)
Patient stated that her insurance does not cover [employer] monitor. Pt wants to cancel monitor. Please advise.

## 2023-01-06 NOTE — Telephone Encounter (Signed)
Returned call to patient. Her insurance company said her monitor would not be covered. She since has spoken to the monitor company and got a quote for $200ish. We discussed the need for the monitor and why it was ordered for her. She has decided to decline at this time. She will return the monitor to [employer] once she received it in the mail

## 2023-01-12 ENCOUNTER — Other Ambulatory Visit: Payer: Self-pay

## 2023-01-12 ENCOUNTER — Encounter (HOSPITAL_COMMUNITY): Payer: Self-pay | Admitting: Emergency Medicine

## 2023-01-12 ENCOUNTER — Emergency Department (HOSPITAL_COMMUNITY)
Admission: EM | Admit: 2023-01-12 | Discharge: 2023-01-13 | Disposition: A | Discharge status: Home / Self Care | Payer: Medicare Other | Attending: Emergency Medicine | Admitting: Emergency Medicine

## 2023-01-12 DIAGNOSIS — Z7901 Long term (current) use of anticoagulants: Secondary | ICD-10-CM | POA: Diagnosis not present

## 2023-01-12 DIAGNOSIS — N280 Ischemia and infarction of kidney: Secondary | ICD-10-CM | POA: Diagnosis not present

## 2023-01-12 DIAGNOSIS — K625 Hemorrhage of anus and rectum: Secondary | ICD-10-CM | POA: Insufficient documentation

## 2023-01-12 DIAGNOSIS — K6389 Other specified diseases of intestine: Secondary | ICD-10-CM | POA: Diagnosis not present

## 2023-01-12 DIAGNOSIS — K7689 Other specified diseases of liver: Secondary | ICD-10-CM | POA: Diagnosis not present

## 2023-01-12 DIAGNOSIS — K5730 Diverticulosis of large intestine without perforation or abscess without bleeding: Secondary | ICD-10-CM | POA: Diagnosis not present

## 2023-01-12 LAB — COMPREHENSIVE METABOLIC PANEL
ALT: 14 U/L (ref 0–44)
AST: 14 U/L — ABNORMAL LOW (ref 15–41)
Albumin: 3.5 g/dL (ref 3.5–5.0)
Alkaline Phosphatase: 111 U/L (ref 38–126)
Anion gap: 9 (ref 5–15)
BUN: 14 mg/dL (ref 8–23)
CO2: 27 mmol/L (ref 22–32)
Calcium: 9 mg/dL (ref 8.9–10.3)
Chloride: 104 mmol/L (ref 98–111)
Creatinine, Ser: 0.99 mg/dL (ref 0.44–1.00)
GFR, Estimated: 58 mL/min — ABNORMAL LOW (ref >60)
Glucose, Bld: 129 mg/dL — ABNORMAL HIGH (ref 70–99)
Potassium: 4.5 mmol/L (ref 3.5–5.1)
Sodium: 140 mmol/L (ref 135–145)
Total Bilirubin: 0.5 mg/dL (ref <1.2)
Total Protein: 7.4 g/dL (ref 6.5–8.1)

## 2023-01-12 LAB — CBC
HCT: 42.9 % (ref 36.0–46.0)
Hemoglobin: 13.3 g/dL (ref 12.0–15.0)
MCH: 29.6 pg (ref 26.0–34.0)
MCHC: 31 g/dL (ref 30.0–36.0)
MCV: 95.3 fL (ref 80.0–100.0)
Platelets: 330 10*3/uL (ref 150–400)
RBC: 4.5 MIL/uL (ref 3.87–5.11)
RDW: 16 % — ABNORMAL HIGH (ref 11.5–15.5)
WBC: 7.1 10*3/uL (ref 4.0–10.5)
nRBC: 0 % (ref 0.0–0.2)

## 2023-01-12 LAB — TYPE AND SCREEN
ABO/RH(D): O POS
Antibody Screen: NEGATIVE

## 2023-01-12 NOTE — ED Triage Notes (Signed)
Pt presents ambulatory to triage via POV with complaints of rectal bleeding x 4-5 days. Pt states she is on Eliquis for a recent blood clot to her kidney and has had some dark colored bleeding from her rectum over the last 2 days. Pt endorses hemorrhoids -no abdominal pain nor N/V/D. A&Ox4 at this time. Denies CP or SOB.

## 2023-01-13 ENCOUNTER — Emergency Department (HOSPITAL_COMMUNITY): Payer: Medicare Other

## 2023-01-13 ENCOUNTER — Inpatient Hospital Stay (HOSPITAL_COMMUNITY)
Admission: RE | Admit: 2023-01-13 | Discharge: 2023-01-13 | Disposition: A | Discharge status: Home / Self Care | Payer: Medicare Other | Source: Ambulatory Visit

## 2023-01-13 ENCOUNTER — Encounter: Payer: Self-pay | Admitting: Gastroenterology

## 2023-01-13 ENCOUNTER — Ambulatory Visit: Payer: Medicare Other | Admitting: Gastroenterology

## 2023-01-13 ENCOUNTER — Encounter: Payer: Self-pay | Admitting: *Deleted

## 2023-01-13 ENCOUNTER — Encounter (HOSPITAL_COMMUNITY): Payer: Self-pay

## 2023-01-13 VITALS — BP 125/82 | HR 71 | Temp 97.8°F | Ht 65.0 in | Wt 179.8 lb

## 2023-01-13 DIAGNOSIS — N280 Ischemia and infarction of kidney: Secondary | ICD-10-CM | POA: Diagnosis not present

## 2023-01-13 DIAGNOSIS — K648 Other hemorrhoids: Secondary | ICD-10-CM

## 2023-01-13 DIAGNOSIS — K6389 Other specified diseases of intestine: Secondary | ICD-10-CM | POA: Diagnosis not present

## 2023-01-13 DIAGNOSIS — K625 Hemorrhage of anus and rectum: Secondary | ICD-10-CM | POA: Diagnosis not present

## 2023-01-13 DIAGNOSIS — K7689 Other specified diseases of liver: Secondary | ICD-10-CM | POA: Diagnosis not present

## 2023-01-13 DIAGNOSIS — K5730 Diverticulosis of large intestine without perforation or abscess without bleeding: Secondary | ICD-10-CM | POA: Diagnosis not present

## 2023-01-13 DIAGNOSIS — K649 Unspecified hemorrhoids: Secondary | ICD-10-CM

## 2023-01-13 HISTORY — DX: Personal history of other venous thrombosis and embolism: Z86.718

## 2023-01-13 LAB — CBC WITH DIFFERENTIAL/PLATELET
Absolute Lymphocytes: 1709 {cells}/uL (ref 850–3900)
Absolute Monocytes: 303 {cells}/uL (ref 200–950)
Basophils Absolute: 30 {cells}/uL (ref 0–200)
Basophils Relative: 0.4 %
Eosinophils Absolute: 141 {cells}/uL (ref 15–500)
Eosinophils Relative: 1.9 %
HCT: 41.6 % (ref 35.0–45.0)
Hemoglobin: 13.4 g/dL (ref 11.7–15.5)
MCH: 29.3 pg (ref 27.0–33.0)
MCHC: 32.2 g/dL (ref 32.0–36.0)
MCV: 90.8 fL (ref 80.0–100.0)
MPV: 10.1 fL (ref 7.5–12.5)
Monocytes Relative: 4.1 %
Neutro Abs: 5217 {cells}/uL (ref 1500–7800)
Neutrophils Relative %: 70.5 %
Platelets: 338 10*3/uL (ref 140–400)
RBC: 4.58 10*6/uL (ref 3.80–5.10)
RDW: 14.8 % (ref 11.0–15.0)
Total Lymphocyte: 23.1 %
WBC: 7.4 10*3/uL (ref 3.8–10.8)

## 2023-01-13 MED ORDER — HYDROCORTISONE (PERIANAL) 2.5 % EX CREA: 1.0000 | TOPICAL_CREAM | Freq: Three times a day (TID) | CUTANEOUS | 1 refills | Status: DC

## 2023-01-13 MED ORDER — IOHEXOL 300 MG/ML  SOLN
100.0000 mL | Freq: Once | INTRAMUSCULAR | Status: AC | PRN
  Administered 2023-01-13: 100 mL via INTRAVENOUS

## 2023-01-13 NOTE — Patient Instructions (Addendum)
Please have blood work completed at [facility] when you leave the office today.   Please hold Eliquis starting today.   We will get you scheduled for colonoscopy on Monday 11/18 with Dr. Marletta Lor at [facility].  For hemorrhoids, use Anusol rectal cream 3 times daily for the next 7 days.  I am sending a prescription to your pharmacy.  For constipation, continue to take Colace 200-300 mg daily.  We will follow-up with you in the office after your colonoscopy.  I am reaching out to Dr. Chestine Spore, the vascular surgeon that you are scheduled to see in December, to update him on the current situation.    Ermalinda Memos, PA-C [facility]

## 2023-01-13 NOTE — H&P (View-Only) (Signed)
Referring Provider: [facility] Primary Care Physician:  Kirstie Peri, MD Primary Gastroenterologist:  Dr. Marletta Lor  Chief Complaint  Patient presents with   Rectal Bleeding    Rectal bleeding for 5 days     HPI:   Lisa [name] is a 77 y.o. 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   Was in the emergency room today reporting rectal bleeding for the last 4 days.  Stated it started out bright red, just when she wiped and in the stool, but turned dark with a little bit of clots the last couple of days and had also been more constipated as well.  Her hemoglobin was 13.3.  BUN within normal limits.  CT A/P with contrast with no acute abnormalities, improved perfusion of the left upper kidney. She was discharged home in stable condition and advised to return to the ER if worsening rectal bleeding and to follow-up with PCP/GI.    Today:  Has been having bright red blood per rectum for the last 5 days. Occurring at least 4-5 times a day. Blood in toilet water, even without a BM.  No black stools.   No abdominal pain. Has chronic intermittent RLQ abdominal pain. Occurs at random. No specific triggers. Short lived. Resolves spontaneously. Notices it more when she is lifting something heavy like flipping a mattress or moving the refrigerator to clean underneath/behind it.   Developed constipation after recent hospitalization. Went 3 days without a BM, but finally had a BM this morning. Took colace and this seemed to help.   Has some weakness, but that has been present since she was admitted to the hospital in October.   History of severe nose bleed with Plavix after stent placed several  years ago.   No NSAIDs.   GERD is well controlled. No nausea, vomiting, or dysphagia.   No prior EGD or colonoscopy.   Past Medical History:  Diagnosis Date   Asthma    COPD (chronic obstructive pulmonary disease) (HCC)    Coronary artery disease    Cough    " ALL MY LIFE "   Dyspnea    GERD (gastroesophageal reflux disease)    Hypertension    NSTEMI (non-ST elevated myocardial infarction) (HCC) 09/01/2016   DES to Cx/OM bifurcation   Unstable angina (HCC) 08/2016    Past Surgical History:  Procedure Laterality Date   ABDOMINAL HYSTERECTOMY     CORONARY STENT INTERVENTION  09/05/2016   Successful complex PCI of the circumflex/OM bifurcation using a Synergy DES   CORONARY STENT INTERVENTION N/A 09/05/2016   Procedure: Coronary Stent Intervention;  Surgeon: Tonny Bollman, MD;  Location: [facility] INVASIVE CV LAB;  Service: Cardiovascular;  Laterality: N/A;   LEFT HEART CATH AND CORONARY ANGIOGRAPHY N/A 09/02/2016   Procedure: Left Heart Cath and Coronary Angiography;  Surgeon: Corky Crafts, MD;  Location: [facility] INVASIVE CV LAB;  Service: Cardiovascular;  Laterality: N/A;    Current Outpatient Medications  Medication Sig Dispense Refill   acetaminophen (TYLENOL) 500 MG tablet Take 1,000 mg by mouth every 6 (six) hours as needed for moderate pain.     apixaban (ELIQUIS) 5 MG TABS tablet Take 1 tablet (5 mg total) by mouth 2 (two) times daily. 60 tablet  0   Cholecalciferol (VITAMIN D3) 125 MCG (5000 UT) CAPS Take 5,000 Units by mouth daily.     Coenzyme Q10 (COQ10) 100 MG CAPS Take 100 mg by mouth daily.     Cyanocobalamin (VITAMIN B-12 PO) Take 1 tablet by mouth daily.     diazepam (VALIUM) 2 MG tablet Take 2 mg by mouth daily as needed for anxiety.     docusate sodium (COLACE) 100 MG capsule Take 100 mg by mouth daily.     esomeprazole (NEXIUM) 20 MG capsule Take 20 mg by mouth daily at 12 noon.     fluocinonide ointment (LIDEX) 0.05 % Apply 1 Application topically 2 (two) times  daily.     fluticasone-salmeterol (ADVAIR HFA) 115-21 MCG/ACT inhaler Inhale 2 puffs into the lungs at bedtime.     furosemide (LASIX) 20 MG tablet Take 20 mg by mouth daily as needed.     Glucosamine-Chondroitin (MOVE FREE PO) Take 1 tablet by mouth daily.     hydrocortisone (ANUSOL-HC) 2.5 % rectal cream Place 1 Application rectally 3 (three) times daily. 30 g 1   lisinopril (ZESTRIL) 20 MG tablet Take 1 tablet (20 mg total) by mouth daily.     metoprolol succinate (TOPROL XL) 25 MG 24 hr tablet Take 0.5 tablets (12.5 mg total) by mouth 2 (two) times daily. (Patient taking differently: Take 25 mg by mouth daily.) 30 tablet 11   nitroGLYCERIN (NITROSTAT) 0.4 MG SL tablet Place 1 tablet (0.4 mg total) under the tongue every 5 (five) minutes as needed for chest pain. 25 tablet 1   potassium chloride (KLOR-CON M) 10 MEQ tablet Take 10 mEq by mouth as needed.     Probiotic Product (PROBIOTIC PO) Take 1 capsule by mouth daily.     No current facility-administered medications for this visit.    Allergies as of 01/13/2023 - Review Complete 01/13/2023  Allergen Reaction Noted   Brilinta [ticagrelor] Shortness Of Breath 10/11/2016   Penicillins Anaphylaxis 11/27/2015   Ranexa [ranolazine] Shortness Of Breath 12/06/2016   Sulfa antibiotics Anaphylaxis and Rash 11/27/2015   Crestor [rosuvastatin] Other (See Comments) 03/28/2020    Family History  Problem Relation Age of Onset   Asthma Mother    Heart attack Father 45   Heart attack Son 65   Prostate cancer Brother     Social History   Socioeconomic History   Marital status: Divorced    Spouse name: Not on file   Number of children: Not on file   Years of education: Not on file   Highest education level: Not on file  Occupational History   Not on file  Tobacco Use   Smoking status: Every Day    Current packs/day: 1.00    Average packs/day: 1 pack/day for 50.0 years (50.0 ttl pk-yrs)    Types: Cigarettes   Smokeless tobacco: Never   Vaping Use   Vaping status: Never Used  Substance and Sexual Activity   Alcohol use: Not Currently    Comment: occasional   Drug use: No   Sexual activity: Yes  Other Topics Concern   Not on file  Social History Narrative   Not on file   Social Determinants of Health   Financial Resource Strain: Low Risk  (07/14/2022)   Received from [facility]   Overall Financial Resource Strain (CARDIA)    Difficulty of Paying Living Expenses: Not hard at all  Food Insecurity: No Food Insecurity (12/28/2022)   Hunger Vital Sign  Worried About Programme researcher, broadcasting/film/video in the Last Year: Never true    Ran Out of Food in the Last Year: Never true  Transportation Needs: No Transportation Needs (12/28/2022)   PRAPARE - Administrator, Civil Service (Medical): No    Lack of Transportation (Non-Medical): No  Physical Activity: Not on file  Stress: Not on file  Social Connections: Not on file  Intimate Partner Violence: Not At Risk (12/28/2022)   Humiliation, Afraid, Rape, and Kick questionnaire    Fear of Current or Ex-Partner: No    Emotionally Abused: No    Physically Abused: No    Sexually Abused: No    Review of Systems: Gen: Denies any fever, chills, cold or flulike symptoms, presyncope, syncope. CV: Denies chest pain, heart palpitations. Resp: Denies shortness of breath, cough. GI: See HPI GU : Denies urinary burning, urinary frequency, urinary hesitancy MS: Denies joint pain. Derm: Denies rash. Psych: Denies depression, anxiety. Heme: See HPI  Physical Exam: BP 125/82 (BP Location: Right Arm, Patient Position: Sitting, Cuff Size: Normal)   Pulse 71   Temp 97.8 F (36.6 C) (Temporal)   Ht 5\' 5"  (1.651 m)   Wt 179 lb 12.8 oz (81.6 kg)   LMP 09/01/2016 (LMP Unknown)   BMI 29.92 kg/m  General:   Alert and oriented. Pleasant and cooperative. Well-nourished and well-developed.  Head:  Normocephalic and atraumatic. Eyes:  Without icterus, sclera  clear and conjunctiva pink.  Ears:  Normal auditory acuity. Lungs:  Clear to auscultation bilaterally. No wheezes, rales, or rhonchi. No distress.  Heart:  S1, S2 present without murmurs appreciated.  Abdomen:  +BS, soft, non-tender and non-distended. No HSM noted. No guarding or rebound. No masses appreciated.  Rectal:  Internal hemorrhoids with slight prolapse. No gross blood or other significant findings.  Msk:  Symmetrical without gross deformities. Normal posture. Extremities:  Without edema. Neurologic:  Alert and  oriented x4;  grossly normal neurologically. Skin:  Intact without significant lesions or rashes. Psych:  Normal mood and affect.    Assessment:  77 y.o. 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 Reports blood filling the toilet bowl at least 4-5 times a day, even without a bowel movement.  No associated abdominal pain or rectal pain.  She does report some mild weakness, but this has been present since her admission in October.  Evaluated in the ER yesterday; hemoglobin 13.3, creatinine 0.99, BUN 14.  CT A/P with no acute abnormalities, improved perfusion of the left upper kidney.  Rectal exam today with evidence of internal hemorrhoids with some prolapse, no gross blood or other significant findings.  Differentials include rectal bleeding related to hemorrhoids which may also be influenced by mild constipation since recent hospital discharge, but unable to rule out colon polyps, malignancy, AVMs.  Eliquis likely the driving factor behind ongoing bleeding.   I have recommended holding Eliquis, updating CBC, and proceeding with colonoscopy on Monday.  I have also prescribed anusol rectal cream for hemorrhoids and advised she continue colace for constipation as this is working well. We discussed ER return precautions. If she has any significant decline in hemoglobin on this repeat CBC, would recommend proceeding to the  ER for inpatient colonoscopy.  I discussed the case with Dr. Sherald Hess (vascular surgeon), after patient left the office, who is in agreement with holding Eliquis.  He would like for her to maintain on 81 mg aspirin for now.This has been relayed to the patient via telephone.     Plan:  CBC STAT. If any significant decline in hemoglobin, she will need to proceed to the emergency room. Hold Eliquis starting today.  Start 81 mg aspirin daily per Dr. Chestine Spore (vascular surgeon). Proceed with colonoscopy with propofol by Dr. Marletta Lor on 01/16/23. The risks, benefits, and alternatives have been discussed with the patient in detail. The patient states understanding and desires to proceed.  ASA 3 Anusol rectal cream 3 times daily.  Prescription sent to pharmacy. Continue Colace 200-300 mg daily. If any worsening rectal bleeding over the weekend, or if she were to develop lightheadedness, dizziness, feel like she would pass out, shortness of breath, chest pain, she is to proceed to the emergency room.   Ermalinda Memos, PA-C [facility] 01/13/2023

## 2023-01-13 NOTE — ED Provider Notes (Signed)
[place] EMERGENCY DEPARTMENT AT [facility] Provider Note   CSN: [id number] Arrival date & time: 01/12/23  2117     History  Chief Complaint  Patient presents with   Rectal Bleeding    Lisa [name] is a 77 y.o. female.  77 year old female recently started on Eliquis for renal artery clot that presents the ER today with some rectal bleeding.  Patient states this last 4 days.  Started out some bright red blood just when she wipes and in this stool around the feces.  Turned into dark blood and then a little bit of clots the last couple days.  Has been low bit more constipated last couple days as well.  No lightheadedness, pallor, weakness, syncope.   Rectal Bleeding      Home Medications Prior to Admission medications   Medication Sig Start Date End Date Taking? Authorizing Provider  acetaminophen (TYLENOL) 500 MG tablet Take 1,000 mg by mouth every 6 (six) hours as needed for moderate pain.    [provider]  apixaban (ELIQUIS) 5 MG TABS tablet Take 1 tablet (5 mg total) by mouth 2 (two) times daily. 01/02/23   Elgergawy, Leana Roe, MD  Cholecalciferol (VITAMIN D3) 125 MCG (5000 UT) CAPS Take 5,000 Units by mouth daily.    [provider]  Coenzyme Q10 (COQ10) 100 MG CAPS Take 100 mg by mouth daily.    [provider]  Cyanocobalamin (VITAMIN B-12 PO) Take 1 tablet by mouth daily.    [provider]  diazepam (VALIUM) 2 MG tablet Take 2 mg by mouth daily as needed for anxiety. 03/16/20   [provider]  docusate sodium (COLACE) 100 MG capsule Take 100 mg by mouth daily.    [provider]  esomeprazole (NEXIUM) 20 MG capsule Take 20 mg by mouth daily at 12 noon.    [provider]  fluocinonide ointment (LIDEX) 0.05 % Apply 1 Application topically 2 (two) times daily.    [provider]  fluticasone-salmeterol (ADVAIR HFA) 115-21 MCG/ACT inhaler Inhale 2 puffs into the lungs at bedtime.     [provider]  furosemide (LASIX) 20 MG tablet Take 20 mg by mouth daily as needed. 12/14/22   [provider]  Glucosamine-Chondroitin (MOVE FREE PO) Take 1 tablet by mouth daily.    [provider]  lisinopril (ZESTRIL) 20 MG tablet Take 1 tablet (20 mg total) by mouth daily. 01/06/23   Elgergawy, Leana Roe, MD  metoprolol succinate (TOPROL XL) 25 MG 24 hr tablet Take 0.5 tablets (12.5 mg total) by mouth 2 (two) times daily. Patient taking differently: Take 25 mg by mouth daily. 04/08/19   Pricilla Riffle, MD  nitroGLYCERIN (NITROSTAT) 0.4 MG SL tablet Place 1 tablet (0.4 mg total) under the tongue every 5 (five) minutes as needed for chest pain. 09/06/16   [facility], Roe Rutherford, PA  potassium chloride (KLOR-CON M) 10 MEQ tablet Take 10 mEq by mouth as needed. 12/14/22   [provider]  Probiotic Product (PROBIOTIC PO) Take 1 capsule by mouth daily.    [provider]      Allergies    Brilinta [ticagrelor], Penicillins, Ranexa [ranolazine], Sulfa antibiotics, and Crestor [rosuvastatin]    Review of Systems   Review of Systems  Gastrointestinal:  Positive for hematochezia.    Physical Exam Updated Vital Signs BP 131/64   Pulse 63   Temp 98.1 F (36.7 C)   Resp 18   Ht 5\' 5"  (1.651 m)  Wt 78.9 kg   LMP 09/01/2016 (LMP Unknown)   SpO2 94%   BMI 28.96 kg/m  Physical Exam Vitals and nursing note reviewed.  Constitutional:      Appearance: She is well-developed.  HENT:     Head: Normocephalic and atraumatic.  Cardiovascular:     Rate and Rhythm: Normal rate and regular rhythm.  Pulmonary:     Effort: No respiratory distress.     Breath sounds: No stridor.  Abdominal:     General: There is no distension.  Genitourinary:    Comments: Chaperoned by female nurse no evidence of active bleeding.  She does have a couple internal hemorrhoids couple external skin tags when internal hemorrhoids may have stigmata of recent bleeding but not  clear. Musculoskeletal:     Cervical back: Normal range of motion.  Skin:    Coloration: Skin is not pale.  Neurological:     Mental Status: She is alert.     ED Results / Procedures / Treatments   Labs (all labs ordered are listed, but only abnormal results are displayed) Labs Reviewed  COMPREHENSIVE METABOLIC PANEL - Abnormal; Notable for the following components:      Result Value   Glucose, Bld 129 (*)    AST 14 (*)    GFR, Estimated 58 (*)    All other components within normal limits  CBC - Abnormal; Notable for the following components:   RDW 16.0 (*)    All other components within normal limits  POC OCCULT BLOOD, ED  TYPE AND SCREEN    EKG None  Radiology CT ABDOMEN PELVIS W CONTRAST  Result Date: 01/13/2023 CLINICAL DATA:  Rectal bleeding, on Eliquis EXAM: CT ABDOMEN AND PELVIS WITH CONTRAST TECHNIQUE: Multidetector CT imaging of the abdomen and pelvis was performed using the standard protocol following bolus administration of intravenous contrast. RADIATION DOSE REDUCTION: This exam was performed according to the departmental dose-optimization program which includes automated exposure control, adjustment of the mA and/or kV according to patient size and/or use of iterative reconstruction technique. CONTRAST:  OMNIPAQUE IOHEXOL 300 MG/ML  SOLN COMPARISON:  12/28/2022 FINDINGS: Lower chest: Mild subpleural scarring at the lung bases. Hepatobiliary: 6 mm central hepatic cyst (series 2/image 13), benign. Gallbladder is unremarkable. No intrahepatic or extrahepatic ductal dilatation. Pancreas: Within normal limits. Spleen: Within normal limits. Adrenals/Urinary Tract: Adrenal glands are within normal limits. Heterogeneous enhancement of the left upper kidney, improved from the recent prior, related to recent renal infarction. Right kidney is within normal limits.  No hydronephrosis. Bladder is within normal limits. Stomach/Bowel: Stomach is within normal limits. No  evidence of bowel obstruction. Normal appendix (series 2/image 58). Extensive sigmoid diverticulosis, without evidence of diverticulitis. No colonic mass on CT. Vascular/Lymphatic: No evidence of abdominal aortic aneurysm. Atherosclerotic calcifications of the abdominal aorta and branch vessels, although vessels remain patent. No suspicious abdominopelvic lymphadenopathy. Reproductive: Status post hysterectomy. Bilateral ovaries are within normal limits. Other: No abdominopelvic ascites. Musculoskeletal: Degenerative changes of the lumbar spine. Mild narrowing of the spinal canal at L2-3, chronic. IMPRESSION: No colonic mass on CT in this patient with rectal bleeding. Extensive sigmoid diverticulosis, without evidence of diverticulitis. Improving perfusion of the left upper kidney, status post recent renal infarction. Electronically Signed   By: Charline Bills M.D.   On: 01/13/2023 01:14    Procedures Procedures    Medications Ordered in ED Medications  iohexol (OMNIPAQUE) 300 MG/ML solution 100 mL (100 mLs Intravenous Contrast Given 01/13/23 0039)    ED Course/  Medical Decision Making/ A&P                                 Medical Decision Making Amount and/or Complexity of Data Reviewed Labs: ordered. Radiology: ordered.  Risk Prescription drug management.   CT scan to evaluate her GI tract for constipation and bleeding and this is reassuring.  Patient's vital signs are stable for multiple hours.  Hemoglobin is stable and normal.  Secondary to the renal arteries embolism we will continue Eliquis for now as her hemoglobin stable and she is minimal bleeding seems like it is distal lower GI.  She will continue to use MiraLAX for constipation.  She starts having any symptoms or increasing bleeding she will return to the ER patient does not seem to improve in a few days and she will follow-up with her doctor or GI however she is never had a colonoscopy so she would like to follow-up with her  PCP.  Her daughter at bedside understand all questions answered.  Final Clinical Impression(s) / ED Diagnoses Final diagnoses:  Rectal bleeding    Rx / DC Orders ED Discharge Orders          Ordered    Ambulatory referral to Gastroenterology        01/13/23 0233              Marily Memos, MD 01/13/23 [phone]

## 2023-01-13 NOTE — Progress Notes (Signed)
Referring Provider: [facility] Primary Care Physician:  Kirstie Peri, MD Primary Gastroenterologist:  Dr. Marletta Lor  Chief Complaint  Patient presents with   Rectal Bleeding    Rectal bleeding for 5 days     HPI:   Lisa [name] is a 77 y.o. 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   Was in the emergency room today reporting rectal bleeding for the last 4 days.  Stated it started out bright red, just when she wiped and in the stool, but turned dark with a little bit of clots the last couple of days and had also been more constipated as well.  Her hemoglobin was 13.3.  BUN within normal limits.  CT A/P with contrast with no acute abnormalities, improved perfusion of the left upper kidney. She was discharged home in stable condition and advised to return to the ER if worsening rectal bleeding and to follow-up with PCP/GI.    Today:  Has been having bright red blood per rectum for the last 5 days. Occurring at least 4-5 times a day. Blood in toilet water, even without a BM.  No black stools.   No abdominal pain. Has chronic intermittent RLQ abdominal pain. Occurs at random. No specific triggers. Short lived. Resolves spontaneously. Notices it more when she is lifting something heavy like flipping a mattress or moving the refrigerator to clean underneath/behind it.   Developed constipation after recent hospitalization. Went 3 days without a BM, but finally had a BM this morning. Took colace and this seemed to help.   Has some weakness, but that has been present since she was admitted to the hospital in October.   History of severe nose bleed with Plavix after stent placed several  years ago.   No NSAIDs.   GERD is well controlled. No nausea, vomiting, or dysphagia.   No prior EGD or colonoscopy.   Past Medical History:  Diagnosis Date   Asthma    COPD (chronic obstructive pulmonary disease) (HCC)    Coronary artery disease    Cough    " ALL MY LIFE "   Dyspnea    GERD (gastroesophageal reflux disease)    Hypertension    NSTEMI (non-ST elevated myocardial infarction) (HCC) 09/01/2016   DES to Cx/OM bifurcation   Unstable angina (HCC) 08/2016    Past Surgical History:  Procedure Laterality Date   ABDOMINAL HYSTERECTOMY     CORONARY STENT INTERVENTION  09/05/2016   Successful complex PCI of the circumflex/OM bifurcation using a Synergy DES   CORONARY STENT INTERVENTION N/A 09/05/2016   Procedure: Coronary Stent Intervention;  Surgeon: Tonny Bollman, MD;  Location: [facility] INVASIVE CV LAB;  Service: Cardiovascular;  Laterality: N/A;   LEFT HEART CATH AND CORONARY ANGIOGRAPHY N/A 09/02/2016   Procedure: Left Heart Cath and Coronary Angiography;  Surgeon: Corky Crafts, MD;  Location: [facility] INVASIVE CV LAB;  Service: Cardiovascular;  Laterality: N/A;    Current Outpatient Medications  Medication Sig Dispense Refill   acetaminophen (TYLENOL) 500 MG tablet Take 1,000 mg by mouth every 6 (six) hours as needed for moderate pain.     apixaban (ELIQUIS) 5 MG TABS tablet Take 1 tablet (5 mg total) by mouth 2 (two) times daily. 60 tablet  0   Cholecalciferol (VITAMIN D3) 125 MCG (5000 UT) CAPS Take 5,000 Units by mouth daily.     Coenzyme Q10 (COQ10) 100 MG CAPS Take 100 mg by mouth daily.     Cyanocobalamin (VITAMIN B-12 PO) Take 1 tablet by mouth daily.     diazepam (VALIUM) 2 MG tablet Take 2 mg by mouth daily as needed for anxiety.     docusate sodium (COLACE) 100 MG capsule Take 100 mg by mouth daily.     esomeprazole (NEXIUM) 20 MG capsule Take 20 mg by mouth daily at 12 noon.     fluocinonide ointment (LIDEX) 0.05 % Apply 1 Application topically 2 (two) times  daily.     fluticasone-salmeterol (ADVAIR HFA) 115-21 MCG/ACT inhaler Inhale 2 puffs into the lungs at bedtime.     furosemide (LASIX) 20 MG tablet Take 20 mg by mouth daily as needed.     Glucosamine-Chondroitin (MOVE FREE PO) Take 1 tablet by mouth daily.     hydrocortisone (ANUSOL-HC) 2.5 % rectal cream Place 1 Application rectally 3 (three) times daily. 30 g 1   lisinopril (ZESTRIL) 20 MG tablet Take 1 tablet (20 mg total) by mouth daily.     metoprolol succinate (TOPROL XL) 25 MG 24 hr tablet Take 0.5 tablets (12.5 mg total) by mouth 2 (two) times daily. (Patient taking differently: Take 25 mg by mouth daily.) 30 tablet 11   nitroGLYCERIN (NITROSTAT) 0.4 MG SL tablet Place 1 tablet (0.4 mg total) under the tongue every 5 (five) minutes as needed for chest pain. 25 tablet 1   potassium chloride (KLOR-CON M) 10 MEQ tablet Take 10 mEq by mouth as needed.     Probiotic Product (PROBIOTIC PO) Take 1 capsule by mouth daily.     No current facility-administered medications for this visit.    Allergies as of 01/13/2023 - Review Complete 01/13/2023  Allergen Reaction Noted   Brilinta [ticagrelor] Shortness Of Breath 10/11/2016   Penicillins Anaphylaxis 11/27/2015   Ranexa [ranolazine] Shortness Of Breath 12/06/2016   Sulfa antibiotics Anaphylaxis and Rash 11/27/2015   Crestor [rosuvastatin] Other (See Comments) 03/28/2020    Family History  Problem Relation Age of Onset   Asthma Mother    Heart attack Father 45   Heart attack Son 65   Prostate cancer Brother     Social History   Socioeconomic History   Marital status: Divorced    Spouse name: Not on file   Number of children: Not on file   Years of education: Not on file   Highest education level: Not on file  Occupational History   Not on file  Tobacco Use   Smoking status: Every Day    Current packs/day: 1.00    Average packs/day: 1 pack/day for 50.0 years (50.0 ttl pk-yrs)    Types: Cigarettes   Smokeless tobacco: Never   Vaping Use   Vaping status: Never Used  Substance and Sexual Activity   Alcohol use: Not Currently    Comment: occasional   Drug use: No   Sexual activity: Yes  Other Topics Concern   Not on file  Social History Narrative   Not on file   Social Determinants of Health   Financial Resource Strain: Low Risk  (07/14/2022)   Received from [facility]   Overall Financial Resource Strain (CARDIA)    Difficulty of Paying Living Expenses: Not hard at all  Food Insecurity: No Food Insecurity (12/28/2022)   Hunger Vital Sign  Worried About Programme researcher, broadcasting/film/video in the Last Year: Never true    Ran Out of Food in the Last Year: Never true  Transportation Needs: No Transportation Needs (12/28/2022)   PRAPARE - Administrator, Civil Service (Medical): No    Lack of Transportation (Non-Medical): No  Physical Activity: Not on file  Stress: Not on file  Social Connections: Not on file  Intimate Partner Violence: Not At Risk (12/28/2022)   Humiliation, Afraid, Rape, and Kick questionnaire    Fear of Current or Ex-Partner: No    Emotionally Abused: No    Physically Abused: No    Sexually Abused: No    Review of Systems: Gen: Denies any fever, chills, cold or flulike symptoms, presyncope, syncope. CV: Denies chest pain, heart palpitations. Resp: Denies shortness of breath, cough. GI: See HPI GU : Denies urinary burning, urinary frequency, urinary hesitancy MS: Denies joint pain. Derm: Denies rash. Psych: Denies depression, anxiety. Heme: See HPI  Physical Exam: BP 125/82 (BP Location: Right Arm, Patient Position: Sitting, Cuff Size: Normal)   Pulse 71   Temp 97.8 F (36.6 C) (Temporal)   Ht 5\' 5"  (1.651 m)   Wt 179 lb 12.8 oz (81.6 kg)   LMP 09/01/2016 (LMP Unknown)   BMI 29.92 kg/m  General:   Alert and oriented. Pleasant and cooperative. Well-nourished and well-developed.  Head:  Normocephalic and atraumatic. Eyes:  Without icterus, sclera  clear and conjunctiva pink.  Ears:  Normal auditory acuity. Lungs:  Clear to auscultation bilaterally. No wheezes, rales, or rhonchi. No distress.  Heart:  S1, S2 present without murmurs appreciated.  Abdomen:  +BS, soft, non-tender and non-distended. No HSM noted. No guarding or rebound. No masses appreciated.  Rectal:  Internal hemorrhoids with slight prolapse. No gross blood or other significant findings.  Msk:  Symmetrical without gross deformities. Normal posture. Extremities:  Without edema. Neurologic:  Alert and  oriented x4;  grossly normal neurologically. Skin:  Intact without significant lesions or rashes. Psych:  Normal mood and affect.    Assessment:  77 y.o. 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 Reports blood filling the toilet bowl at least 4-5 times a day, even without a bowel movement.  No associated abdominal pain or rectal pain.  She does report some mild weakness, but this has been present since her admission in October.  Evaluated in the ER yesterday; hemoglobin 13.3, creatinine 0.99, BUN 14.  CT A/P with no acute abnormalities, improved perfusion of the left upper kidney.  Rectal exam today with evidence of internal hemorrhoids with some prolapse, no gross blood or other significant findings.  Differentials include rectal bleeding related to hemorrhoids which may also be influenced by mild constipation since recent hospital discharge, but unable to rule out colon polyps, malignancy, AVMs.  Eliquis likely the driving factor behind ongoing bleeding.   I have recommended holding Eliquis, updating CBC, and proceeding with colonoscopy on Monday.  I have also prescribed anusol rectal cream for hemorrhoids and advised she continue colace for constipation as this is working well. We discussed ER return precautions. If she has any significant decline in hemoglobin on this repeat CBC, would recommend proceeding to the  ER for inpatient colonoscopy.  I discussed the case with Dr. Sherald Hess (vascular surgeon), after patient left the office, who is in agreement with holding Eliquis.  He would like for her to maintain on 81 mg aspirin for now.This has been relayed to the patient via telephone.     Plan:  CBC STAT. If any significant decline in hemoglobin, she will need to proceed to the emergency room. Hold Eliquis starting today.  Start 81 mg aspirin daily per Dr. Chestine Spore (vascular surgeon). Proceed with colonoscopy with propofol by Dr. Marletta Lor on 01/16/23. The risks, benefits, and alternatives have been discussed with the patient in detail. The patient states understanding and desires to proceed.  ASA 3 Anusol rectal cream 3 times daily.  Prescription sent to pharmacy. Continue Colace 200-300 mg daily. If any worsening rectal bleeding over the weekend, or if she were to develop lightheadedness, dizziness, feel like she would pass out, shortness of breath, chest pain, she is to proceed to the emergency room.   Ermalinda Memos, PA-C [facility] 01/13/2023

## 2023-01-16 ENCOUNTER — Ambulatory Visit (HOSPITAL_COMMUNITY)
Admission: RE | Admit: 2023-01-16 | Discharge: 2023-01-16 | Disposition: A | Discharge status: Home / Self Care | Payer: Medicare Other | Attending: Internal Medicine | Admitting: Internal Medicine

## 2023-01-16 ENCOUNTER — Ambulatory Visit: Payer: Medicare Other | Admitting: Gastroenterology

## 2023-01-16 ENCOUNTER — Encounter (HOSPITAL_COMMUNITY)
Admission: RE | Disposition: A | Discharge status: Home / Self Care | Payer: Self-pay | Source: Home / Self Care | Attending: Internal Medicine

## 2023-01-16 ENCOUNTER — Ambulatory Visit (HOSPITAL_COMMUNITY): Payer: Medicare Other | Admitting: Anesthesiology

## 2023-01-16 ENCOUNTER — Encounter (HOSPITAL_COMMUNITY): Payer: Self-pay

## 2023-01-16 DIAGNOSIS — K5900 Constipation, unspecified: Secondary | ICD-10-CM | POA: Diagnosis not present

## 2023-01-16 DIAGNOSIS — F17200 Nicotine dependence, unspecified, uncomplicated: Secondary | ICD-10-CM | POA: Diagnosis not present

## 2023-01-16 DIAGNOSIS — I110 Hypertensive heart disease with heart failure: Secondary | ICD-10-CM | POA: Diagnosis not present

## 2023-01-16 DIAGNOSIS — K625 Hemorrhage of anus and rectum: Secondary | ICD-10-CM

## 2023-01-16 DIAGNOSIS — I25119 Atherosclerotic heart disease of native coronary artery with unspecified angina pectoris: Secondary | ICD-10-CM | POA: Insufficient documentation

## 2023-01-16 DIAGNOSIS — K5730 Diverticulosis of large intestine without perforation or abscess without bleeding: Secondary | ICD-10-CM | POA: Diagnosis not present

## 2023-01-16 DIAGNOSIS — K648 Other hemorrhoids: Secondary | ICD-10-CM | POA: Diagnosis not present

## 2023-01-16 DIAGNOSIS — I2510 Atherosclerotic heart disease of native coronary artery without angina pectoris: Secondary | ICD-10-CM

## 2023-01-16 DIAGNOSIS — K635 Polyp of colon: Secondary | ICD-10-CM | POA: Diagnosis not present

## 2023-01-16 DIAGNOSIS — K219 Gastro-esophageal reflux disease without esophagitis: Secondary | ICD-10-CM | POA: Insufficient documentation

## 2023-01-16 DIAGNOSIS — D123 Benign neoplasm of transverse colon: Secondary | ICD-10-CM | POA: Diagnosis not present

## 2023-01-16 DIAGNOSIS — Z955 Presence of coronary angioplasty implant and graft: Secondary | ICD-10-CM | POA: Diagnosis not present

## 2023-01-16 DIAGNOSIS — D126 Benign neoplasm of colon, unspecified: Secondary | ICD-10-CM

## 2023-01-16 DIAGNOSIS — Z7901 Long term (current) use of anticoagulants: Secondary | ICD-10-CM | POA: Insufficient documentation

## 2023-01-16 DIAGNOSIS — Z8249 Family history of ischemic heart disease and other diseases of the circulatory system: Secondary | ICD-10-CM | POA: Diagnosis not present

## 2023-01-16 DIAGNOSIS — I5032 Chronic diastolic (congestive) heart failure: Secondary | ICD-10-CM | POA: Diagnosis not present

## 2023-01-16 DIAGNOSIS — D120 Benign neoplasm of cecum: Secondary | ICD-10-CM | POA: Diagnosis not present

## 2023-01-16 DIAGNOSIS — F17210 Nicotine dependence, cigarettes, uncomplicated: Secondary | ICD-10-CM | POA: Insufficient documentation

## 2023-01-16 DIAGNOSIS — I252 Old myocardial infarction: Secondary | ICD-10-CM | POA: Insufficient documentation

## 2023-01-16 DIAGNOSIS — J449 Chronic obstructive pulmonary disease, unspecified: Secondary | ICD-10-CM | POA: Diagnosis not present

## 2023-01-16 DIAGNOSIS — D122 Benign neoplasm of ascending colon: Secondary | ICD-10-CM | POA: Diagnosis not present

## 2023-01-16 DIAGNOSIS — J4489 Other specified chronic obstructive pulmonary disease: Secondary | ICD-10-CM | POA: Diagnosis not present

## 2023-01-16 DIAGNOSIS — D124 Benign neoplasm of descending colon: Secondary | ICD-10-CM | POA: Insufficient documentation

## 2023-01-16 HISTORY — PX: POLYPECTOMY: SHX5525

## 2023-01-16 HISTORY — PX: COLONOSCOPY WITH PROPOFOL: SHX5780

## 2023-01-16 SURGERY — COLONOSCOPY WITH PROPOFOL
Anesthesia: General

## 2023-01-16 MED ORDER — PHENYLEPHRINE 80 MCG/ML (10ML) SYRINGE FOR IV PUSH (FOR BLOOD PRESSURE SUPPORT)
PREFILLED_SYRINGE | INTRAVENOUS | Status: AC
  Filled 2023-01-16: qty 10

## 2023-01-16 MED ORDER — PHENYLEPHRINE 80 MCG/ML (10ML) SYRINGE FOR IV PUSH (FOR BLOOD PRESSURE SUPPORT)
PREFILLED_SYRINGE | INTRAVENOUS | Status: DC | PRN
  Administered 2023-01-16 (×4): 160 ug via INTRAVENOUS

## 2023-01-16 MED ORDER — STERILE WATER FOR IRRIGATION IR SOLN
Status: DC | PRN
  Administered 2023-01-16: 120 mL

## 2023-01-16 MED ORDER — LACTATED RINGERS IV SOLN: INTRAVENOUS | Status: DC | PRN

## 2023-01-16 MED ORDER — PROPOFOL 500 MG/50ML IV EMUL
INTRAVENOUS | Status: DC | PRN
  Administered 2023-01-16: 100 ug/kg/min via INTRAVENOUS

## 2023-01-16 MED ORDER — PROPOFOL 10 MG/ML IV BOLUS
INTRAVENOUS | Status: DC | PRN
  Administered 2023-01-16: 50 mg via INTRAVENOUS
  Administered 2023-01-16: 80 mg via INTRAVENOUS
  Administered 2023-01-16 (×2): 40 mg via INTRAVENOUS

## 2023-01-16 NOTE — Op Note (Signed)
[facility] [name] Procedure Date: 01/16/2023 8:25 AM MRN: [id number] Date of Birth: 1946-02-13 Attending MD: Hennie Duos. Marletta Lor , [place], [id number] CSN: [id number] Age: 77 Admit Type: Outpatient Procedure:                Colonoscopy Indications:              Rectal bleeding Providers:                Hennie Duos. Marletta Lor, DO, Sheran Fava, Emilee                            Tubb RN, RN, Dyann Ruddle Referring MD:              Medicines:                See the Anesthesia note for documentation of the                            administered medications Complications:            No immediate complications. Estimated Blood Loss:     Estimated blood loss was minimal. Procedure:                Pre-Anesthesia Assessment:                           - The anesthesia plan was to use monitored                            anesthesia care (MAC).                           After obtaining informed consent, the colonoscope                            was passed under direct vision. Throughout the                            procedure, the patient's blood pressure, pulse, and                            oxygen saturations were monitored continuously. The                            [id number]-HQ190L ([id number]) scope was introduced through                            the anus and advanced to the the cecum, identified                            by appendiceal orifice and ileocecal valve. The                            colonoscopy was performed without difficulty. The                            patient tolerated the procedure well. The  quality                            of the bowel preparation was evaluated using the                            BBPS [facility] Bowel Preparation Scale) with scores                            of: Right Colon = 2 (minor amount of residual                            staining, small fragments of stool and/or opaque                            liquid, but mucosa seen  well), Transverse Colon = 3                            (entire mucosa seen well with no residual staining,                            small fragments of stool or opaque liquid) and Left                            Colon = 3 (entire mucosa seen well with no residual                            staining, small fragments of stool or opaque                            liquid). The total BBPS score equals 8. The quality                            of the bowel preparation was good. Scope In: 8:45:12 AM Scope Out: 9:08:42 AM Scope Withdrawal Time: 0 hours 18 minutes 32 seconds  Total Procedure Duration: 0 hours 23 minutes 30 seconds  Findings:      Non-bleeding internal hemorrhoids were found during retroflexion. The       hemorrhoids were large.      Multiple large-mouthed and small-mouthed diverticula were found in the       sigmoid colon, descending colon and transverse colon.      Two sessile polyps were found in the ascending colon and cecum. The       polyps were 6 to 8 mm in size. These polyps were removed with a cold       snare. Resection and retrieval were complete.      Four sessile polyps were found in the descending colon and transverse       colon. The polyps were 5 to 8 mm in size. These polyps were removed with       a cold snare. Resection and retrieval were complete.      The exam was otherwise without abnormality. Impression:               - Non-bleeding internal hemorrhoids.                           -  Diverticulosis in the sigmoid colon, in the                            descending colon and in the transverse colon.                           - Two 6 to 8 mm polyps in the ascending colon and                            in the cecum, removed with a cold snare. Resected                            and retrieved.                           - Four 5 to 8 mm polyps in the descending colon and                            in the transverse colon, removed with a cold snare.                             Resected and retrieved.                           - The examination was otherwise normal. Moderate Sedation:      Per Anesthesia Care Recommendation:           - Patient has a contact number available for                            emergencies. The signs and symptoms of potential                            delayed complications were discussed with the                            patient. Return to normal activities tomorrow.                            Written discharge instructions were provided to the                            patient.                           - Resume previous diet.                           - Continue present medications.                           - Await pathology results.                           - No repeat colonoscopy due to age.                           -  Return to GI clinic in 4 weeks.                           - Rectal bleeding likely due to internal                            hemorrhoids. Recommend fiber therapy. Limit toilet                            time to <5 min. COnsider hemorrhoid banding if                            bleeding continues. Procedure Code(s):        --- Professional ---                           [phone], Colonoscopy, flexible; with removal of                            tumor(s), polyp(s), or other lesion(s) by snare                            technique Diagnosis Code(s):        --- Professional ---                           K64.8, Other hemorrhoids                           D12.2, Benign neoplasm of ascending colon                           D12.0, Benign neoplasm of cecum                           D12.4, Benign neoplasm of descending colon                           D12.3, Benign neoplasm of transverse colon (hepatic                            flexure or splenic flexure)                           K62.5, Hemorrhage of anus and rectum                           K57.30, Diverticulosis of large intestine without                             perforation or abscess without bleeding CPT copyright 2022 American Medical Association. All rights reserved. The codes documented in this report are preliminary and upon coder review may  be revised to meet current compliance requirements. Hennie Duos. Marletta Lor, DO Hennie Duos. Marletta Lor, DO 01/16/2023 9:16:08 AM This report has been signed electronically. Number of Addenda: 0

## 2023-01-16 NOTE — Anesthesia Procedure Notes (Signed)
Date/Time: 01/16/2023 8:37 AM  Performed by: Franco Nones, CRNAPre-anesthesia Checklist: Patient identified, Emergency Drugs available, Suction available, Timeout performed and Patient being monitored Patient Re-evaluated:Patient Re-evaluated prior to induction Oxygen Delivery Method: Non-rebreather mask

## 2023-01-16 NOTE — Interval H&P Note (Signed)
History and Physical Interval Note:  01/16/2023 8:28 AM  Lisa [name]  has presented today for surgery, with the diagnosis of rb.  The various methods of treatment have been discussed with the patient and family. After consideration of risks, benefits and other options for treatment, the patient has consented to  Procedure(s) with comments: COLONOSCOPY WITH PROPOFOL (N/A) - 845am, asa 3 as a surgical intervention.  The patient's history has been reviewed, patient examined, no change in status, stable for surgery.  I have reviewed the patient's chart and labs.  Questions were answered to the patient's satisfaction.     Lanelle Bal

## 2023-01-16 NOTE — Transfer of Care (Signed)
Immediate Anesthesia Transfer of Care Note  Patient: Lisa [name]  Procedure(s) Performed: COLONOSCOPY WITH PROPOFOL POLYPECTOMY  Patient Location: Short Stay  Anesthesia Type:General  Level of Consciousness: drowsy  Airway & Oxygen Therapy: Patient Spontanous Breathing and Patient connected to nasal cannula oxygen  Post-op Assessment: Report given to RN and Post -op Vital signs reviewed and stable  Post vital signs: Reviewed and stable  Last Vitals:  Vitals Value Taken Time  BP 101/37 01/16/23 0911  Temp 36.8 C 01/16/23 0911  Pulse 68 01/16/23 0911  Resp 21 01/16/23 0911  SpO2 99 % 01/16/23 0911    Last Pain:  Vitals:   01/16/23 0911  TempSrc: Axillary  PainSc: Asleep         Complications: No notable events documented.

## 2023-01-16 NOTE — Discharge Instructions (Addendum)
  Colonoscopy Discharge Instructions  Read the instructions outlined below and refer to this sheet in the next few weeks. These discharge instructions provide you with general information on caring for yourself after you leave the hospital. Your doctor may also give you specific instructions. While your treatment has been planned according to the most current medical practices available, unavoidable complications occasionally occur.   ACTIVITY You may resume your regular activity, but move at a slower pace for the next 24 hours.  Take frequent rest periods for the next 24 hours.  Walking will help get rid of the air and reduce the bloated feeling in your belly (abdomen).  No driving for 24 hours (because of the medicine (anesthesia) used during the test).   Do not sign any important legal documents or operate any machinery for 24 hours (because of the anesthesia used during the test).  NUTRITION Drink plenty of fluids.  You may resume your normal diet as instructed by your doctor.  Begin with a light meal and progress to your normal diet. Heavy or fried foods are harder to digest and may make you feel sick to your stomach (nauseated).  Avoid alcoholic beverages for 24 hours or as instructed.  MEDICATIONS You may resume your normal medications unless your doctor tells you otherwise.  WHAT YOU CAN EXPECT TODAY Some feelings of bloating in the abdomen.  Passage of more gas than usual.  Spotting of blood in your stool or on the toilet paper.  IF YOU HAD POLYPS REMOVED DURING THE COLONOSCOPY: No aspirin products for 7 days or as instructed.  No alcohol for 7 days or as instructed.  Eat a soft diet for the next 24 hours.  FINDING OUT THE RESULTS OF YOUR TEST Not all test results are available during your visit. If your test results are not back during the visit, make an appointment with your caregiver to find out the results. Do not assume everything is normal if you have not heard from your  caregiver or the medical facility. It is important for you to follow up on all of your test results.  SEEK IMMEDIATE MEDICAL ATTENTION IF: You have more than a spotting of blood in your stool.  Your belly is swollen (abdominal distention).  You are nauseated or vomiting.  You have a temperature over 101.  You have abdominal pain or discomfort that is severe or gets worse throughout the day.   Your colonoscopy revealed  polyp(s) which I removed successfully. Await pathology results, my office will contact you.  Given your age, I do not think we need to repeat colonoscopy for polyp surveillance.  You also have diverticulosis and large internal hemorrhoids. I would recommend increasing fiber in your diet or adding OTC Benefiber/Metamucil. Be sure to drink at least 4 to 6 glasses of water daily.  Rectal bleeding likely from internal hemorrhoids.  Try to limit toilet time to less than 5 minutes.  Keep bowel movements soft.    Okay to resume Eliquis tomorrow.  Follow-up in GI office in 3 to 4 weeks    I hope you have a great rest of your week!  Hennie Duos. Marletta Lor, D.O. Gastroenterology and Hepatology [facility]

## 2023-01-16 NOTE — Anesthesia Preprocedure Evaluation (Signed)
Anesthesia Evaluation  Patient identified by MRN, date of birth, ID band Patient awake    Reviewed: Allergy & Precautions, H&P , NPO status , Patient's Chart, lab work & pertinent test results, reviewed documented beta blocker date and time   Airway Mallampati: II  TM Distance: >3 FB Neck ROM: full    Dental no notable dental hx.    Pulmonary neg pulmonary ROS, shortness of breath, asthma , COPD, Current Smoker and Patient abstained from smoking.   Pulmonary exam normal breath sounds clear to auscultation       Cardiovascular Exercise Tolerance: Good hypertension, + angina  + CAD, + Past MI and + Cardiac Stents   Rhythm:regular Rate:Normal     Neuro/Psych negative neurological ROS  negative psych ROS   GI/Hepatic negative GI ROS, Neg liver ROS,GERD  ,,  Endo/Other  negative endocrine ROS    Renal/GU negative Renal ROS  negative genitourinary   Musculoskeletal   Abdominal   Peds  Hematology negative hematology ROS (+)   Anesthesia Other Findings   Reproductive/Obstetrics negative OB ROS                             Anesthesia Physical Anesthesia Plan  ASA: 3  Anesthesia Plan: General   Post-op Pain Management:    Induction:   PONV Risk Score and Plan: Propofol infusion  Airway Management Planned:   Additional Equipment:   Intra-op Plan:   Post-operative Plan:   Informed Consent: I have reviewed the patients History and Physical, chart, labs and discussed the procedure including the risks, benefits and alternatives for the proposed anesthesia with the patient or authorized representative who has indicated his/her understanding and acceptance.     Dental Advisory Given  Plan Discussed with: CRNA  Anesthesia Plan Comments:        Anesthesia Quick Evaluation

## 2023-01-16 NOTE — Anesthesia Postprocedure Evaluation (Signed)
Anesthesia Post Note  Patient: Lisa [name]  Procedure(s) Performed: COLONOSCOPY WITH PROPOFOL POLYPECTOMY  Patient location during evaluation: Phase II Anesthesia Type: General Level of consciousness: awake Pain management: pain level controlled Vital Signs Assessment: post-procedure vital signs reviewed and stable Respiratory status: spontaneous breathing and respiratory function stable Cardiovascular status: blood pressure returned to baseline and stable Postop Assessment: no headache and no apparent nausea or vomiting Anesthetic complications: no Comments: Late entry   No notable events documented.   Last Vitals:  Vitals:   01/16/23 0758 01/16/23 0911  BP: 116/64 (!) 101/37  Pulse: 68 68  Resp: 18 (!) 21  Temp: 36.7 C 36.8 C  SpO2: 92% 99%    Last Pain:  Vitals:   01/16/23 0913  TempSrc:   PainSc: 0-No pain                 Windell Norfolk

## 2023-01-17 LAB — SURGICAL PATHOLOGY

## 2023-01-18 DIAGNOSIS — I509 Heart failure, unspecified: Secondary | ICD-10-CM | POA: Diagnosis not present

## 2023-01-18 DIAGNOSIS — Z299 Encounter for prophylactic measures, unspecified: Secondary | ICD-10-CM | POA: Diagnosis not present

## 2023-01-18 DIAGNOSIS — F17210 Nicotine dependence, cigarettes, uncomplicated: Secondary | ICD-10-CM | POA: Diagnosis not present

## 2023-01-18 DIAGNOSIS — I10 Essential (primary) hypertension: Secondary | ICD-10-CM | POA: Diagnosis not present

## 2023-01-20 ENCOUNTER — Encounter (HOSPITAL_COMMUNITY): Payer: Self-pay | Admitting: Internal Medicine

## 2023-01-30 NOTE — Progress Notes (Unsigned)
[name] MRN: [id number] DOB: 1946-01-29 Sex: female  REASON FOR CONSULT: Hospital follow-up, left renal infarct  HPI: Lisa [name] is a 77 y.o. female, with history of COPD, coronary artery disease status post NSTEMI that presents for hospital follow-up of left renal infarct.  Previously seen in consultation on 12/28/2022 with left renal infarct.  She underwent an unremarkable cardioembolic workup.  She was discharged on a DOAC and a statin.  Ultimately we felt that her event could have been related to plaque at the left renal ostium but there was no flow-limiting stenosis.  I was contacted by GI recently that she developed a GI bleed and her DOAC was stopped.  Past Medical History:  Diagnosis Date   Asthma    COPD (chronic obstructive pulmonary disease) (HCC)    Coronary artery disease    Cough    " ALL MY LIFE "   Dyspnea    GERD (gastroesophageal reflux disease)    History of blood clots    patient had a blood clot in her kidney 01/03/2023.  pt is on Eliquis   Hypertension    NSTEMI (non-ST elevated myocardial infarction) (HCC) 09/01/2016   DES to Cx/OM bifurcation   Unstable angina (HCC) 08/2016    Past Surgical History:  Procedure Laterality Date   ABDOMINAL HYSTERECTOMY     COLONOSCOPY WITH PROPOFOL N/A 01/16/2023   Procedure: COLONOSCOPY WITH PROPOFOL;  Surgeon: Lanelle Bal, DO;  Location: [facility];  Service: Endoscopy;  Laterality: N/A;  845am, asa 3   CORONARY STENT INTERVENTION  09/05/2016   Successful complex PCI of the circumflex/OM bifurcation using a Synergy DES   CORONARY STENT INTERVENTION N/A 09/05/2016   Procedure: Coronary Stent Intervention;  Surgeon: Tonny Bollman, MD;  Location: [facility] INVASIVE CV LAB;  Service: Cardiovascular;  Laterality: N/A;   LEFT HEART CATH AND CORONARY ANGIOGRAPHY N/A 09/02/2016   Procedure: Left Heart Cath and Coronary Angiography;  Surgeon: Corky Crafts, MD;  Location: [facility] INVASIVE CV LAB;   Service: Cardiovascular;  Laterality: N/A;   POLYPECTOMY  01/16/2023   Procedure: POLYPECTOMY;  Surgeon: Lanelle Bal, DO;  Location: [facility];  Service: Endoscopy;;    Family History  Problem Relation Age of Onset   Asthma Mother    Heart attack Father 85   Heart attack Son 95   Prostate cancer Brother     SOCIAL HISTORY: Social History   Socioeconomic History   Marital status: Divorced    Spouse name: Not on file   Number of children: Not on file   Years of education: Not on file   Highest education level: Not on file  Occupational History   Not on file  Tobacco Use   Smoking status: Every Day    Current packs/day: 1.00    Average packs/day: 1 pack/day for 50.0 years (50.0 ttl pk-yrs)    Types: Cigarettes   Smokeless tobacco: Never  Vaping Use   Vaping status: Never Used  Substance and Sexual Activity   Alcohol use: Not Currently    Comment: occasional   Drug use: No   Sexual activity: Yes  Other Topics Concern   Not on file  Social History Narrative   Not on file   Social Determinants of Health   Financial Resource Strain: Low Risk  (07/14/2022)   [facility] from [facility]   Overall Financial Resource Strain (CARDIA)    Difficulty of Paying Living Expenses: Not hard at  all  Food Insecurity: No Food Insecurity (12/28/2022)   Hunger Vital Sign    Worried About Running Out of Food in the Last Year: Never true    Ran Out of Food in the Last Year: Never true  Transportation Needs: No Transportation Needs (12/28/2022)   PRAPARE - Administrator, Civil Service (Medical): No    Lack of Transportation (Non-Medical): No  Physical Activity: Not on file  Stress: Not on file  Social Connections: Not on file  Intimate Partner Violence: Not At Risk (12/28/2022)   Humiliation, Afraid, Rape, and Kick questionnaire    Fear of Current or Ex-Partner: No    Emotionally Abused: No    Physically Abused: No    Sexually Abused: No     Allergies  Allergen Reactions   Brilinta [Ticagrelor] Shortness Of Breath   Penicillins Anaphylaxis    Has patient had a PCN reaction causing immediate rash, facial/tongue/throat swelling, SOB or lightheadedness with hypotension: Yes Has patient had a PCN reaction causing severe rash involving mucus membranes or skin necrosis: yes Has patient had a PCN reaction that required hospitalization: no Has patient had a PCN reaction occurring within the last 10 years: no If all of the above answers are "NO", then may proceed with Cephalosporin use.    Ranexa [Ranolazine] Shortness Of Breath   Sulfa Antibiotics Anaphylaxis and Rash    Blistering rash on hands and feet   Crestor [Rosuvastatin] Other (See Comments)    Myalgias    Current Outpatient Medications  Medication Sig Dispense Refill   acetaminophen (TYLENOL) 500 MG tablet Take 1,000 mg by mouth every 6 (six) hours as needed for moderate pain.     apixaban (ELIQUIS) 5 MG TABS tablet Take 1 tablet (5 mg total) by mouth 2 (two) times daily. 60 tablet 0   Cholecalciferol (VITAMIN D3) 125 MCG (5000 UT) CAPS Take 5,000 Units by mouth daily.     Coenzyme Q10 (COQ10) 100 MG CAPS Take 100 mg by mouth daily.     Cyanocobalamin (VITAMIN B-12 PO) Take 1 tablet by mouth daily.     diazepam (VALIUM) 2 MG tablet Take 2 mg by mouth daily as needed for anxiety.     docusate sodium (COLACE) 100 MG capsule Take 100 mg by mouth daily.     esomeprazole (NEXIUM) 20 MG capsule Take 20 mg by mouth daily at 12 noon.     fluocinonide ointment (LIDEX) 0.05 % Apply 1 Application topically 2 (two) times daily.     fluticasone-salmeterol (ADVAIR HFA) 115-21 MCG/ACT inhaler Inhale 2 puffs into the lungs at bedtime.     furosemide (LASIX) 20 MG tablet Take 20 mg by mouth daily as needed.     Glucosamine-Chondroitin (MOVE FREE PO) Take 1 tablet by mouth daily.     hydrocortisone (ANUSOL-HC) 2.5 % rectal cream Place 1 Application rectally 3 (three) times daily. 30  g 1   lisinopril (ZESTRIL) 20 MG tablet Take 1 tablet (20 mg total) by mouth daily.     metoprolol succinate (TOPROL XL) 25 MG 24 hr tablet Take 0.5 tablets (12.5 mg total) by mouth 2 (two) times daily. (Patient taking differently: Take 25 mg by mouth daily.) 30 tablet 11   nitroGLYCERIN (NITROSTAT) 0.4 MG SL tablet Place 1 tablet (0.4 mg total) under the tongue every 5 (five) minutes as needed for chest pain. 25 tablet 1   potassium chloride (KLOR-CON M) 10 MEQ tablet Take 10 mEq by mouth as needed.  Probiotic Product (PROBIOTIC PO) Take 1 capsule by mouth daily.     No current facility-administered medications for this visit.    REVIEW OF SYSTEMS:  [X]  denotes positive finding, [ ]  denotes negative finding Cardiac  Comments:  Chest pain or chest pressure: ***   Shortness of breath upon exertion:    Short of breath when lying flat:    Irregular heart rhythm:        Vascular    Pain in calf, thigh, or hip brought on by ambulation:    Pain in feet at night that wakes you up from your sleep:     Blood clot in your veins:    Leg swelling:         Pulmonary    Oxygen at home:    Productive cough:     Wheezing:         Neurologic    Sudden weakness in arms or legs:     Sudden numbness in arms or legs:     Sudden onset of difficulty speaking or slurred speech:    Temporary loss of vision in one eye:     Problems with dizziness:         Gastrointestinal    Blood in stool:     Vomited blood:         Genitourinary    Burning when urinating:     Blood in urine:        Psychiatric    Major depression:         Hematologic    Bleeding problems:    Problems with blood clotting too easily:        Skin    Rashes or ulcers:        Constitutional    Fever or chills:      PHYSICAL EXAM: There were no vitals filed for this visit.  GENERAL: The patient is a well-nourished female, in no acute distress. The vital signs are documented above. CARDIAC: There is a regular rate  and rhythm.  VASCULAR: *** PULMONARY: There is good air exchange bilaterally without wheezing or rales. ABDOMEN: Soft and non-tender with normal pitched bowel sounds.  MUSCULOSKELETAL: There are no major deformities or cyanosis. NEUROLOGIC: No focal weakness or paresthesias are detected. SKIN: There are no ulcers or rashes noted. PSYCHIATRIC: The patient has a normal affect.  DATA:   ***  Assessment/Plan:  77 y.o. female, with history of COPD, coronary artery disease status post NSTEMI that presents for hospital follow-up of left renal infarct.  Previously seen in consultation on 12/28/2022 with left renal infarct.  She underwent an unremarkable cardioembolic workup.  She was discharged on a DOAC and a statin.  Ultimately we felt that her event could have been related to plaque at the left renal ostium but there was no flow-limiting stenosis.  I discussed she should stay on aspirin statin at a minimum.  I am okay stopping her DOAC as she had a GI bleed.  Will need to follow her left renal artery disease with duplex again in about 6 months.   Cephus Shelling, MD [facility] Office: [phone]

## 2023-01-31 ENCOUNTER — Encounter: Payer: Medicare Other | Admitting: Vascular Surgery

## 2023-02-08 ENCOUNTER — Telehealth: Payer: Self-pay | Admitting: Internal Medicine

## 2023-02-08 DIAGNOSIS — L110 Acquired keratosis follicularis: Secondary | ICD-10-CM | POA: Diagnosis not present

## 2023-02-08 DIAGNOSIS — B351 Tinea unguium: Secondary | ICD-10-CM | POA: Diagnosis not present

## 2023-02-08 DIAGNOSIS — M79671 Pain in right foot: Secondary | ICD-10-CM | POA: Diagnosis not present

## 2023-02-08 DIAGNOSIS — M79674 Pain in right toe(s): Secondary | ICD-10-CM | POA: Diagnosis not present

## 2023-02-08 DIAGNOSIS — M79675 Pain in left toe(s): Secondary | ICD-10-CM | POA: Diagnosis not present

## 2023-02-08 DIAGNOSIS — M79672 Pain in left foot: Secondary | ICD-10-CM | POA: Diagnosis not present

## 2023-02-08 DIAGNOSIS — L609 Nail disorder, unspecified: Secondary | ICD-10-CM | POA: Diagnosis not present

## 2023-02-08 MED ORDER — APIXABAN 5 MG PO TABS: 5.0000 mg | ORAL_TABLET | Freq: Two times a day (BID) | ORAL | 2 refills | Status: DC

## 2023-02-08 NOTE — Telephone Encounter (Signed)
Pt c/o medication issue:  1. Name of Medication:   apixaban (ELIQUIS) 5 MG TABS tablet    2. How are you currently taking this medication (dosage and times per day)?   Take 1 tablet (5 mg total) by mouth 2 (two) times daily.    3. Are you having a reaction (difficulty breathing--STAT)? No  4. What is your medication issue? Pt states that she was prescribed medication while in the hospital. She would like to know if Dr. Tenny Craw would like for her to continue to take it. If so she will need a refill called into the [facility] on file. Pt would like a c/b regarding this matter

## 2023-02-08 NOTE — Telephone Encounter (Signed)
Patient notified and verbalized understanding. Pt stated she would r/s with [facility] and keep upcoming cardiology appt with provider.

## 2023-02-08 NOTE — Telephone Encounter (Signed)
    By review of the chart, she was admitted in 12/2022 for a left renal infarct and [facility] recommended 3 months of Eliquis and then transitioning back to ASA. It was recommended to have a 30-day monitor as an outpatient to rule out a cardiac cause for her infarct but appears she declined this. Can provide refills of Eliquis for 3 months but would recommend that she keep follow-up with Vascular as recommended at the time of discharge (appears she canceled her appointment for earlier this month with them).   Signed, Ellsworth Lennox, PA-C 02/08/2023, 10:30 AM Pager: [phone]

## 2023-02-10 DIAGNOSIS — I509 Heart failure, unspecified: Secondary | ICD-10-CM | POA: Diagnosis not present

## 2023-02-10 DIAGNOSIS — Z299 Encounter for prophylactic measures, unspecified: Secondary | ICD-10-CM | POA: Diagnosis not present

## 2023-02-10 DIAGNOSIS — I10 Essential (primary) hypertension: Secondary | ICD-10-CM | POA: Diagnosis not present

## 2023-02-10 DIAGNOSIS — M79606 Pain in leg, unspecified: Secondary | ICD-10-CM | POA: Diagnosis not present

## 2023-02-10 DIAGNOSIS — J449 Chronic obstructive pulmonary disease, unspecified: Secondary | ICD-10-CM | POA: Diagnosis not present

## 2023-02-10 DIAGNOSIS — R52 Pain, unspecified: Secondary | ICD-10-CM | POA: Diagnosis not present

## 2023-02-14 ENCOUNTER — Inpatient Hospital Stay (HOSPITAL_COMMUNITY)
Admission: EM | Admit: 2023-02-14 | Discharge: 2023-02-21 | DRG: 981 | Disposition: A | Discharge status: Other Acute Inpatient Hospital | Payer: Medicare Other | Attending: Family Medicine | Admitting: Family Medicine

## 2023-02-14 ENCOUNTER — Emergency Department (HOSPITAL_COMMUNITY): Payer: Medicare Other

## 2023-02-14 ENCOUNTER — Encounter (HOSPITAL_COMMUNITY): Payer: Self-pay | Admitting: *Deleted

## 2023-02-14 ENCOUNTER — Other Ambulatory Visit: Payer: Self-pay

## 2023-02-14 DIAGNOSIS — J153 Pneumonia due to streptococcus, group B: Secondary | ICD-10-CM | POA: Diagnosis present

## 2023-02-14 DIAGNOSIS — E785 Hyperlipidemia, unspecified: Secondary | ICD-10-CM | POA: Diagnosis not present

## 2023-02-14 DIAGNOSIS — F419 Anxiety disorder, unspecified: Secondary | ICD-10-CM | POA: Diagnosis present

## 2023-02-14 DIAGNOSIS — J9601 Acute respiratory failure with hypoxia: Secondary | ICD-10-CM | POA: Diagnosis present

## 2023-02-14 DIAGNOSIS — J440 Chronic obstructive pulmonary disease with acute lower respiratory infection: Secondary | ICD-10-CM | POA: Diagnosis not present

## 2023-02-14 DIAGNOSIS — I959 Hypotension, unspecified: Secondary | ICD-10-CM | POA: Diagnosis not present

## 2023-02-14 DIAGNOSIS — K5730 Diverticulosis of large intestine without perforation or abscess without bleeding: Secondary | ICD-10-CM | POA: Diagnosis present

## 2023-02-14 DIAGNOSIS — Z7951 Long term (current) use of inhaled steroids: Secondary | ICD-10-CM

## 2023-02-14 DIAGNOSIS — E669 Obesity, unspecified: Secondary | ICD-10-CM | POA: Diagnosis present

## 2023-02-14 DIAGNOSIS — N280 Ischemia and infarction of kidney: Secondary | ICD-10-CM | POA: Diagnosis not present

## 2023-02-14 DIAGNOSIS — D6869 Other thrombophilia: Secondary | ICD-10-CM | POA: Diagnosis present

## 2023-02-14 DIAGNOSIS — I25110 Atherosclerotic heart disease of native coronary artery with unstable angina pectoris: Secondary | ICD-10-CM | POA: Diagnosis present

## 2023-02-14 DIAGNOSIS — R230 Cyanosis: Secondary | ICD-10-CM | POA: Diagnosis present

## 2023-02-14 DIAGNOSIS — I252 Old myocardial infarction: Secondary | ICD-10-CM

## 2023-02-14 DIAGNOSIS — R201 Hypoesthesia of skin: Secondary | ICD-10-CM | POA: Diagnosis present

## 2023-02-14 DIAGNOSIS — H409 Unspecified glaucoma: Secondary | ICD-10-CM | POA: Diagnosis not present

## 2023-02-14 DIAGNOSIS — Z888 Allergy status to other drugs, medicaments and biological substances status: Secondary | ICD-10-CM

## 2023-02-14 DIAGNOSIS — Z7901 Long term (current) use of anticoagulants: Secondary | ICD-10-CM

## 2023-02-14 DIAGNOSIS — Z90710 Acquired absence of both cervix and uterus: Secondary | ICD-10-CM

## 2023-02-14 DIAGNOSIS — I517 Cardiomegaly: Secondary | ICD-10-CM | POA: Diagnosis not present

## 2023-02-14 DIAGNOSIS — E876 Hypokalemia: Secondary | ICD-10-CM | POA: Diagnosis present

## 2023-02-14 DIAGNOSIS — Z720 Tobacco use: Secondary | ICD-10-CM | POA: Diagnosis present

## 2023-02-14 DIAGNOSIS — F39 Unspecified mood [affective] disorder: Secondary | ICD-10-CM | POA: Diagnosis present

## 2023-02-14 DIAGNOSIS — J449 Chronic obstructive pulmonary disease, unspecified: Secondary | ICD-10-CM | POA: Diagnosis not present

## 2023-02-14 DIAGNOSIS — I110 Hypertensive heart disease with heart failure: Secondary | ICD-10-CM | POA: Diagnosis not present

## 2023-02-14 DIAGNOSIS — Z6826 Body mass index (BMI) 26.0-26.9, adult: Secondary | ICD-10-CM

## 2023-02-14 DIAGNOSIS — I743 Embolism and thrombosis of arteries of the lower extremities: Secondary | ICD-10-CM | POA: Diagnosis not present

## 2023-02-14 DIAGNOSIS — I10 Essential (primary) hypertension: Secondary | ICD-10-CM

## 2023-02-14 DIAGNOSIS — D72829 Elevated white blood cell count, unspecified: Secondary | ICD-10-CM | POA: Diagnosis not present

## 2023-02-14 DIAGNOSIS — K219 Gastro-esophageal reflux disease without esophagitis: Secondary | ICD-10-CM | POA: Diagnosis not present

## 2023-02-14 DIAGNOSIS — Z882 Allergy status to sulfonamides status: Secondary | ICD-10-CM

## 2023-02-14 DIAGNOSIS — J439 Emphysema, unspecified: Secondary | ICD-10-CM | POA: Diagnosis not present

## 2023-02-14 DIAGNOSIS — Z955 Presence of coronary angioplasty implant and graft: Secondary | ICD-10-CM

## 2023-02-14 DIAGNOSIS — I200 Unstable angina: Secondary | ICD-10-CM | POA: Diagnosis present

## 2023-02-14 DIAGNOSIS — D6859 Other primary thrombophilia: Secondary | ICD-10-CM | POA: Diagnosis present

## 2023-02-14 DIAGNOSIS — F17210 Nicotine dependence, cigarettes, uncomplicated: Secondary | ICD-10-CM | POA: Diagnosis not present

## 2023-02-14 DIAGNOSIS — Z66 Do not resuscitate: Secondary | ICD-10-CM | POA: Diagnosis present

## 2023-02-14 DIAGNOSIS — R590 Localized enlarged lymph nodes: Secondary | ICD-10-CM | POA: Diagnosis present

## 2023-02-14 DIAGNOSIS — Z79899 Other long term (current) drug therapy: Secondary | ICD-10-CM

## 2023-02-14 DIAGNOSIS — C9200 Acute myeloblastic leukemia, not having achieved remission: Secondary | ICD-10-CM | POA: Diagnosis not present

## 2023-02-14 DIAGNOSIS — R918 Other nonspecific abnormal finding of lung field: Secondary | ICD-10-CM | POA: Diagnosis not present

## 2023-02-14 DIAGNOSIS — R410 Disorientation, unspecified: Secondary | ICD-10-CM | POA: Diagnosis not present

## 2023-02-14 DIAGNOSIS — R600 Localized edema: Secondary | ICD-10-CM | POA: Diagnosis not present

## 2023-02-14 DIAGNOSIS — R0602 Shortness of breath: Secondary | ICD-10-CM | POA: Diagnosis not present

## 2023-02-14 DIAGNOSIS — M79671 Pain in right foot: Secondary | ICD-10-CM | POA: Diagnosis not present

## 2023-02-14 DIAGNOSIS — Z515 Encounter for palliative care: Secondary | ICD-10-CM | POA: Diagnosis not present

## 2023-02-14 DIAGNOSIS — I70209 Unspecified atherosclerosis of native arteries of extremities, unspecified extremity: Secondary | ICD-10-CM | POA: Diagnosis not present

## 2023-02-14 DIAGNOSIS — M7989 Other specified soft tissue disorders: Secondary | ICD-10-CM | POA: Diagnosis not present

## 2023-02-14 DIAGNOSIS — D696 Thrombocytopenia, unspecified: Secondary | ICD-10-CM | POA: Diagnosis present

## 2023-02-14 DIAGNOSIS — Z86718 Personal history of other venous thrombosis and embolism: Secondary | ICD-10-CM

## 2023-02-14 DIAGNOSIS — J90 Pleural effusion, not elsewhere classified: Secondary | ICD-10-CM | POA: Diagnosis not present

## 2023-02-14 DIAGNOSIS — I5033 Acute on chronic diastolic (congestive) heart failure: Secondary | ICD-10-CM

## 2023-02-14 DIAGNOSIS — I480 Paroxysmal atrial fibrillation: Secondary | ICD-10-CM

## 2023-02-14 DIAGNOSIS — Z8249 Family history of ischemic heart disease and other diseases of the circulatory system: Secondary | ICD-10-CM

## 2023-02-14 DIAGNOSIS — R0989 Other specified symptoms and signs involving the circulatory and respiratory systems: Secondary | ICD-10-CM | POA: Diagnosis not present

## 2023-02-14 DIAGNOSIS — R062 Wheezing: Secondary | ICD-10-CM | POA: Diagnosis not present

## 2023-02-14 DIAGNOSIS — I70221 Atherosclerosis of native arteries of extremities with rest pain, right leg: Principal | ICD-10-CM | POA: Diagnosis present

## 2023-02-14 DIAGNOSIS — Z7189 Other specified counseling: Secondary | ICD-10-CM | POA: Diagnosis not present

## 2023-02-14 DIAGNOSIS — J811 Chronic pulmonary edema: Secondary | ICD-10-CM | POA: Diagnosis not present

## 2023-02-14 DIAGNOSIS — R1110 Vomiting, unspecified: Secondary | ICD-10-CM | POA: Diagnosis not present

## 2023-02-14 DIAGNOSIS — I672 Cerebral atherosclerosis: Secondary | ICD-10-CM | POA: Diagnosis not present

## 2023-02-14 DIAGNOSIS — R0902 Hypoxemia: Secondary | ICD-10-CM | POA: Diagnosis not present

## 2023-02-14 DIAGNOSIS — D649 Anemia, unspecified: Secondary | ICD-10-CM | POA: Diagnosis not present

## 2023-02-14 DIAGNOSIS — J9811 Atelectasis: Secondary | ICD-10-CM | POA: Diagnosis not present

## 2023-02-14 DIAGNOSIS — M79604 Pain in right leg: Secondary | ICD-10-CM | POA: Diagnosis not present

## 2023-02-14 DIAGNOSIS — Z880 Allergy status to penicillin: Secondary | ICD-10-CM

## 2023-02-14 LAB — BLOOD GAS, VENOUS
Acid-Base Excess: 3.2 mmol/L — ABNORMAL HIGH (ref 0.0–2.0)
Bicarbonate: 27.9 mmol/L (ref 20.0–28.0)
Drawn by: 1528
O2 Saturation: 56.5 %
Patient temperature: 37.1
pCO2, Ven: 42 mm[Hg] — ABNORMAL LOW (ref 44–60)
pH, Ven: 7.43 (ref 7.25–7.43)
pO2, Ven: <31 mm[Hg] — CL (ref 32–45)

## 2023-02-14 LAB — CBC WITH DIFFERENTIAL/PLATELET
Abs Immature Granulocytes: 5 10*3/uL — ABNORMAL HIGH (ref 0.00–0.07)
Abs Immature Granulocytes: 6.9 10*3/uL — ABNORMAL HIGH (ref 0.00–0.07)
Basophils Absolute: 0.6 10*3/uL — ABNORMAL HIGH (ref 0.0–0.1)
Basophils Absolute: 0 10*3/uL (ref 0.0–0.1)
Basophils Relative: 1 %
Basophils Relative: 0 %
Eosinophils Absolute: 0 10*3/uL (ref 0.0–0.5)
Eosinophils Absolute: 1.3 10*3/uL — ABNORMAL HIGH (ref 0.0–0.5)
Eosinophils Relative: 0 %
Eosinophils Relative: 2 %
HCT: 37.1 % (ref 36.0–46.0)
HCT: 34.6 % — ABNORMAL LOW (ref 36.0–46.0)
Hemoglobin: 11.7 g/dL — ABNORMAL LOW (ref 12.0–15.0)
Hemoglobin: 11 g/dL — ABNORMAL LOW (ref 12.0–15.0)
Lymphocytes Relative: 22 %
Lymphocytes Relative: 25 %
Lymphs Abs: 12.3 10*3/uL — ABNORMAL HIGH (ref 0.7–4.0)
Lymphs Abs: 15.8 10*3/uL — ABNORMAL HIGH (ref 0.7–4.0)
MCH: 31 pg (ref 26.0–34.0)
MCH: 30.8 pg (ref 26.0–34.0)
MCHC: 31.5 g/dL (ref 30.0–36.0)
MCHC: 31.8 g/dL (ref 30.0–36.0)
MCV: 98.4 fL (ref 80.0–100.0)
MCV: 96.9 fL (ref 80.0–100.0)
Metamyelocytes Relative: 3 %
Metamyelocytes Relative: 3 %
Monocytes Absolute: 24.6 10*3/uL — ABNORMAL HIGH (ref 0.1–1.0)
Monocytes Absolute: 22.1 10*3/uL — ABNORMAL HIGH (ref 0.1–1.0)
Monocytes Relative: 44 %
Monocytes Relative: 35 %
Myelocytes: 3 %
Myelocytes: 3 %
Neutro Abs: 6.1 10*3/uL (ref 1.7–7.7)
Neutro Abs: 10.8 10*3/uL — ABNORMAL HIGH (ref 1.7–7.7)
Neutrophils Relative %: 11 %
Neutrophils Relative %: 17 %
Other: 13 %
Other: 10 %
Platelets: 57 10*3/uL — ABNORMAL LOW (ref 150–400)
Platelets: 71 10*3/uL — ABNORMAL LOW (ref 150–400)
Promyelocytes Relative: 3 %
Promyelocytes Relative: 5 %
RBC: 3.77 MIL/uL — ABNORMAL LOW (ref 3.87–5.11)
RBC: 3.57 MIL/uL — ABNORMAL LOW (ref 3.87–5.11)
RDW: 20.1 % — ABNORMAL HIGH (ref 11.5–15.5)
RDW: 19.8 % — ABNORMAL HIGH (ref 11.5–15.5)
WBC: 55.9 10*3/uL (ref 4.0–10.5)
WBC: 63 10*3/uL (ref 4.0–10.5)
nRBC: 0.2 % (ref 0.0–0.2)
nRBC: 0.2 % (ref 0.0–0.2)

## 2023-02-14 LAB — BRAIN NATRIURETIC PEPTIDE: B Natriuretic Peptide: 74 pg/mL (ref 0.0–100.0)

## 2023-02-14 LAB — HEPATIC FUNCTION PANEL
ALT: 11 U/L (ref 0–44)
AST: 15 U/L (ref 15–41)
Albumin: 3.2 g/dL — ABNORMAL LOW (ref 3.5–5.0)
Alkaline Phosphatase: 107 U/L (ref 38–126)
Bilirubin, Direct: 0.1 mg/dL (ref 0.0–0.2)
Indirect Bilirubin: 0.4 mg/dL (ref 0.3–0.9)
Total Bilirubin: 0.5 mg/dL (ref <1.2)
Total Protein: 6.8 g/dL (ref 6.5–8.1)

## 2023-02-14 LAB — BASIC METABOLIC PANEL
Anion gap: 9 (ref 5–15)
BUN: 7 mg/dL — ABNORMAL LOW (ref 8–23)
CO2: 27 mmol/L (ref 22–32)
Calcium: 8.7 mg/dL — ABNORMAL LOW (ref 8.9–10.3)
Chloride: 105 mmol/L (ref 98–111)
Creatinine, Ser: 1.05 mg/dL — ABNORMAL HIGH (ref 0.44–1.00)
GFR, Estimated: 54 mL/min — ABNORMAL LOW (ref >60)
Glucose, Bld: 113 mg/dL — ABNORMAL HIGH (ref 70–99)
Potassium: 2.8 mmol/L — ABNORMAL LOW (ref 3.5–5.1)
Sodium: 141 mmol/L (ref 135–145)

## 2023-02-14 LAB — PROTIME-INR
INR: 1.5 — ABNORMAL HIGH (ref 0.8–1.2)
Prothrombin Time: 18.5 s — ABNORMAL HIGH (ref 11.4–15.2)

## 2023-02-14 LAB — TROPONIN I (HIGH SENSITIVITY)
Troponin I (High Sensitivity): 12 ng/L (ref <18)
Troponin I (High Sensitivity): 12 ng/L (ref <18)

## 2023-02-14 LAB — PHOSPHORUS: Phosphorus: 2 mg/dL — ABNORMAL LOW (ref 2.5–4.6)

## 2023-02-14 LAB — APTT: aPTT: 32 s (ref 24–36)

## 2023-02-14 LAB — URIC ACID: Uric Acid, Serum: 6.5 mg/dL (ref 2.5–7.1)

## 2023-02-14 LAB — FIBRINOGEN: Fibrinogen: 430 mg/dL (ref 210–475)

## 2023-02-14 LAB — MAGNESIUM: Magnesium: 1.7 mg/dL (ref 1.7–2.4)

## 2023-02-14 LAB — HEPARIN LEVEL (UNFRACTIONATED): Heparin Unfractionated: >1.1 [IU]/mL — ABNORMAL HIGH (ref 0.30–0.70)

## 2023-02-14 MED ORDER — METOPROLOL SUCCINATE ER 25 MG PO TB24
25.0000 mg | ORAL_TABLET | Freq: Every day | ORAL | Status: DC
  Administered 2023-02-16 – 2023-02-18 (×2): 25 mg via ORAL
  Filled 2023-02-14 (×3): qty 1

## 2023-02-14 MED ORDER — IPRATROPIUM-ALBUTEROL 0.5-2.5 (3) MG/3ML IN SOLN
3.0000 mL | Freq: Once | RESPIRATORY_TRACT | Status: AC
  Administered 2023-02-14: 3 mL via RESPIRATORY_TRACT
  Filled 2023-02-14: qty 3

## 2023-02-14 MED ORDER — HEPARIN BOLUS VIA INFUSION
4000.0000 [IU] | Freq: Once | INTRAVENOUS | Status: AC
  Administered 2023-02-14: 4000 [IU] via INTRAVENOUS

## 2023-02-14 MED ORDER — POTASSIUM CHLORIDE CRYS ER 20 MEQ PO TBCR
40.0000 meq | EXTENDED_RELEASE_TABLET | Freq: Once | ORAL | Status: AC
  Administered 2023-02-14: 40 meq via ORAL
  Filled 2023-02-14: qty 2

## 2023-02-14 MED ORDER — PANTOPRAZOLE SODIUM 40 MG PO TBEC
40.0000 mg | DELAYED_RELEASE_TABLET | Freq: Every day | ORAL | Status: DC
  Administered 2023-02-15 – 2023-02-21 (×7): 40 mg via ORAL
  Filled 2023-02-14 (×7): qty 1

## 2023-02-14 MED ORDER — POTASSIUM PHOSPHATES 15 MMOLE/5ML IV SOLN
15.0000 mmol | Freq: Once | INTRAVENOUS | Status: DC
  Filled 2023-02-14: qty 5

## 2023-02-14 MED ORDER — DIAZEPAM 2 MG PO TABS
2.0000 mg | ORAL_TABLET | Freq: Every day | ORAL | Status: DC | PRN
  Administered 2023-02-15 – 2023-02-20 (×3): 2 mg via ORAL
  Filled 2023-02-14 (×4): qty 1

## 2023-02-14 MED ORDER — POLYETHYLENE GLYCOL 3350 17 G PO PACK
17.0000 g | PACK | Freq: Every day | ORAL | Status: DC | PRN
  Administered 2023-02-18: 17 g via ORAL
  Filled 2023-02-14: qty 1

## 2023-02-14 MED ORDER — OXYCODONE HCL 5 MG PO TABS
2.5000 mg | ORAL_TABLET | Freq: Four times a day (QID) | ORAL | Status: DC | PRN
  Filled 2023-02-14: qty 1

## 2023-02-14 MED ORDER — OXYCODONE HCL 5 MG PO TABS: 2.5000 mg | ORAL_TABLET | Freq: Four times a day (QID) | ORAL | Status: DC | PRN

## 2023-02-14 MED ORDER — HEPARIN (PORCINE) 25000 UT/250ML-% IV SOLN
1400.0000 [IU]/h | INTRAVENOUS | Status: DC
  Administered 2023-02-14: 1250 [IU]/h via INTRAVENOUS
  Administered 2023-02-15 – 2023-02-17 (×3): 1400 [IU]/h via INTRAVENOUS
  Filled 2023-02-14 (×4): qty 250

## 2023-02-14 MED ORDER — ACETAMINOPHEN 325 MG PO TABS
650.0000 mg | ORAL_TABLET | Freq: Once | ORAL | Status: AC
  Administered 2023-02-14: 650 mg via ORAL
  Filled 2023-02-14: qty 2

## 2023-02-14 MED ORDER — SODIUM CHLORIDE 0.9 % IV SOLN: INTRAVENOUS | Status: AC

## 2023-02-14 MED ORDER — SODIUM CHLORIDE 0.9% FLUSH
3.0000 mL | Freq: Two times a day (BID) | INTRAVENOUS | Status: DC
  Administered 2023-02-15 – 2023-02-19 (×5): 3 mL via INTRAVENOUS

## 2023-02-14 MED ORDER — IOHEXOL 350 MG/ML SOLN
150.0000 mL | Freq: Once | INTRAVENOUS | Status: AC | PRN
  Administered 2023-02-14: 150 mL via INTRAVENOUS

## 2023-02-14 MED ORDER — ACETAMINOPHEN 500 MG PO TABS
1000.0000 mg | ORAL_TABLET | Freq: Four times a day (QID) | ORAL | Status: DC | PRN
  Administered 2023-02-15 – 2023-02-16 (×3): 1000 mg via ORAL
  Filled 2023-02-14 (×3): qty 2

## 2023-02-14 NOTE — ED Provider Notes (Signed)
[place] EMERGENCY DEPARTMENT AT [facility] Provider Note   CSN: [id number] Arrival date & time: 02/14/23  1030     History  Chief Complaint  Patient presents with   Leg Pain    Lisa [name] is a 77 y.o. female.   Leg Pain Associated symptoms: no fever        Lisa [name] is a 77 y.o. 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  She began having swelling of her bilateral lower legs one week ago.  Takes lasix daily.  She noticed a blue discoloration to the tips of the toes of her right foot one week ago.  She has pain to the second third and fourth toes only.  Pain worsens when supine.  Improves when standing.  Seen by her PCP and podiatry without clear cause of her symptoms.  Cigarette smoker, no history of supplemental oxygen requirement  Home Medications Prior to Admission medications   Medication Sig Start Date End Date Taking? Authorizing Provider  acetaminophen (TYLENOL) 500 MG tablet Take 1,000 mg by mouth every 6 (six) hours as needed for moderate pain.    [provider]  apixaban (ELIQUIS) 5 MG TABS tablet Take 1 tablet (5 mg total) by mouth 2 (two) times daily. 02/08/23   Pricilla Riffle, MD  Cholecalciferol (VITAMIN D3) 125 MCG (5000 UT) CAPS Take 5,000 Units by mouth daily.    [provider]  Coenzyme Q10 (COQ10) 100 MG CAPS Take 100 mg by mouth daily.    [provider]  Cyanocobalamin (VITAMIN B-12 PO) Take 1 tablet by mouth daily.    [provider]  diazepam (VALIUM) 2 MG tablet Take 2 mg by mouth daily as needed for anxiety. 03/16/20   [provider]  docusate sodium (COLACE) 100 MG capsule Take 100 mg by mouth daily.    [provider]  esomeprazole (NEXIUM) 20 MG capsule  Take 20 mg by mouth daily at 12 noon.    [provider]  fluocinonide ointment (LIDEX) 0.05 % Apply 1 Application topically 2 (two) times daily.    [provider]  fluticasone-salmeterol (ADVAIR HFA) 115-21 MCG/ACT inhaler Inhale 2 puffs into the lungs at bedtime.    [provider]  furosemide (LASIX) 20 MG tablet Take 20 mg by mouth daily as needed. 12/14/22   [provider]  Glucosamine-Chondroitin (MOVE FREE PO) Take 1 tablet by mouth daily.    [provider]  hydrocortisone (ANUSOL-HC) 2.5 % rectal cream Place 1 Application rectally 3 (three) times daily. 01/13/23   Letta Median, PA-C  lisinopril (ZESTRIL) 20 MG tablet Take 1 tablet (20 mg total) by mouth daily. 01/06/23   Elgergawy, Leana Roe, MD  metoprolol succinate (TOPROL XL) 25 MG 24 hr tablet Take 0.5 tablets (12.5 mg total) by mouth 2 (two) times daily. Patient taking differently: Take 25 mg by mouth daily. 04/08/19   Pricilla Riffle, MD  nitroGLYCERIN (NITROSTAT) 0.4 MG SL tablet Place 1 tablet (0.4 mg total) under the tongue every 5 (five) minutes as needed for chest pain. 09/06/16   [facility], Roe Rutherford, PA  potassium chloride (KLOR-CON M) 10 MEQ tablet Take 10 mEq by mouth as needed. 12/14/22   [provider]  Probiotic Product (PROBIOTIC PO) Take 1 capsule by mouth daily.  [provider]      Allergies    Brilinta [ticagrelor], Penicillins, Ranexa [ranolazine], Sulfa antibiotics, and Crestor [rosuvastatin]    Review of Systems   Review of Systems  Constitutional:  Negative for chills and fever.  Respiratory:  Positive for cough and shortness of breath.   Cardiovascular:  Positive for chest pain.  Gastrointestinal:  Negative for abdominal pain, diarrhea, nausea and vomiting.  Genitourinary:  Negative for dysuria and flank pain.  Musculoskeletal:  Positive for arthralgias (Right foot pain, pain bilateral lower legs right greater than left).  Neurological:   Negative for dizziness, syncope and weakness.    Physical Exam Updated Vital Signs BP 129/78   Pulse 81   Temp 98.8 F (37.1 C) (Oral)   Resp 16   Ht 5\' 7"  (1.702 m)   Wt 78 kg   LMP 09/01/2016 (LMP Unknown)   SpO2 90%   BMI 26.94 kg/m  Physical Exam Vitals and nursing note reviewed.  Constitutional:      General: She is not in acute distress.    Appearance: Normal appearance. She is not ill-appearing or toxic-appearing.  Cardiovascular:     Rate and Rhythm: Normal rate and regular rhythm.     Comments: Dusky appearance to the distal tip of the toes of the right foot.  Dorsalis pedis pulse faintly heard with Doppler.  Extremity slightly cool to touch  See attached photos Pulmonary:     Effort: Pulmonary effort is normal.     Breath sounds: Wheezing present.  Abdominal:     Palpations: Abdomen is soft.     Tenderness: There is no abdominal tenderness.  Musculoskeletal:     Cervical back: Normal range of motion.     Right lower leg: Edema present.     Left lower leg: Edema present.     Comments: Pitting edema bilateral lower extremities, tenderness to palpation right posterior lower leg.  No palpable cord.  No excessive erythema of the lower extremity.  Skin:    General: Skin is warm.     Capillary Refill: Capillary refill takes 2 to 3 seconds.  Neurological:     General: No focal deficit present.     Mental Status: She is alert.     Sensory: No sensory deficit.     Motor: No weakness.        ED Results / Procedures / Treatments   Labs (all labs ordered are listed, but only abnormal results are displayed) Labs Reviewed  CBC WITH DIFFERENTIAL/PLATELET - Abnormal; Notable for the following components:      Result Value   WBC 55.9 (*)    RBC 3.77 (*)    Hemoglobin 11.7 (*)    RDW 20.1 (*)    Platelets 57 (*)    Lymphs Abs 12.3 (*)    Monocytes Absolute 24.6 (*)    Basophils Absolute 0.6 (*)    Abs Immature Granulocytes 5.00 (*)    All other components  within normal limits  BASIC METABOLIC PANEL - Abnormal; Notable for the following components:   Potassium 2.8 (*)    Glucose, Bld 113 (*)    BUN 7 (*)    Creatinine, Ser 1.05 (*)    Calcium 8.7 (*)    GFR, Estimated 54 (*)    All other components within normal limits  BRAIN NATRIURETIC PEPTIDE  PATHOLOGIST SMEAR REVIEW  CBC WITH DIFFERENTIAL/PLATELET  APTT  PROTIME-INR  CBC  TROPONIN I (HIGH SENSITIVITY)  TROPONIN I (HIGH SENSITIVITY)  EKG None  Radiology CT Angio Chest PE W and/or Wo Contrast Result Date: 02/14/2023 CLINICAL DATA:  Right leg pain for 1 week that has gotten progressively worse. Lasix not decreasing swelling. PE suspected. EXAM: CT ANGIOGRAPHY CHEST WITH CONTRAST CT ANGIOGRAPHY OF ABDOMINAL AORTA WITH ILIOFEMORAL RUNOFF TECHNIQUE: Multidetector CT imaging of the abdomen, pelvis and lower extremities was performed using the standard protocol during bolus administration of intravenous contrast. Multiplanar CT image reconstructions and MIPs were obtained to evaluate the vascular anatomy. Multidetector CT imaging of the chest was performed using the standard protocol during bolus administration of intravenous contrast. Multiplanar CT image reconstructions and MIPs were obtained to evaluate the vascular anatomy. RADIATION DOSE REDUCTION: This exam was performed according to the departmental dose-optimization program which includes automated exposure control, adjustment of the mA and/or kV according to patient size and/or use of iterative reconstruction technique. CONTRAST:  OMNIPAQUE IOHEXOL 350 MG/ML SOLN COMPARISON:  Same day chest radiograph; CT abdomen and pelvis 01/13/2023 and CT chest 12/30/2022 FINDINGS: Cardiovascular: No pericardial effusion. No pulmonary embolism. Normal caliber aorta. Aortic atherosclerosis. Mediastinum/Nodes: Trachea and esophagus are unremarkable. New mediastinal and right hilar lymphadenopathy. For example 1.0 cm pretracheal node on series  1/image 33 and 1.0 cm right hilar node on series 1/image 41. Lungs/Pleura: Emphysema. Diffuse interlobular septal thickening. There are new bilateral patchy ground-glass opacities in the dependent lungs bilaterally. No pleural effusion or pneumothorax. Musculoskeletal: No acute fracture. Review of the MIP images confirms the above findings. VASCULAR Aorta: No aneurysm or dissection. Scattered mixed density atherosclerotic plaque without hemodynamically significant stenosis. Celiac: Severe narrowing at the origin of the celiac axis. SMA: Patent. Renals: Patent left renal artery. Moderate narrowing at the origin of the right renal artery. IMA: Patent. RIGHT Lower Extremity Inflow: Scattered atherosclerotic plaque without hemodynamically significant narrowing. No aneurysm or dissection. Outflow: Occlusion of the distal superficial femoral artery with reconstitution of the popliteal artery. Runoff: Three-vessel runoff of the right lower extremity. The distal anterior tibial and peroneal arteries are atretic. Patent dorsalis pedis. LEFT Lower Extremity Inflow: Scattered atherosclerotic plaque without hemodynamically significant narrowing. No aneurysm or dissection. Outflow: Patent.  No aneurysm or dissection. Runoff: Patent three vessel runoff to the ankle. Patent dorsalis pedis. Veins: No obvious venous abnormality within the limitations of this arterial phase study. Review of the MIP images confirms the above findings. NON-VASCULAR Hepatobiliary: No acute abnormality. Pancreas: Unremarkable. Spleen: Unremarkable. Adrenals/Urinary Tract: Normal adrenal glands. Geographic hypoattenuation in the anterior left kidney is unchanged from 01/13/2023 and compatible with infarct. No urinary calculi or hydronephrosis. Unremarkable bladder. Stomach/Bowel: Normal caliber large and small bowel. Colonic diverticulosis without diverticulitis. Appendix and stomach are within normal limits. Lymphatic: No lymphadenopathy. Reproductive:  Hysterectomy. Other: No free intraperitoneal fluid or air. Musculoskeletal: No acute fracture. IMPRESSION: 1. Occlusion of the distal right superficial femoral artery with reconstitution of the popliteal artery. 2. No evidence of pulmonary embolism. 3. New bilateral patchy ground-glass opacities in the dependent lungs bilaterally, likely infectious/inflammatory. 4. New mediastinal and right hilar lymphadenopathy, favored reactive. 5. Unchanged left renal infarct. Aortic Atherosclerosis (ICD10-I70.0) and Emphysema (ICD10-J43.9). Critical Value/emergent results were called by telephone at the time of interpretation on 02/14/2023 at 6:50 pm to provider Dr. Wyn Forster, who verbally acknowledged these results. Electronically Signed   By: Minerva Fester M.D.   On: 02/14/2023 18:51   CT Angio Aortobifemoral W and/or Wo Contrast Result Date: 02/14/2023 CLINICAL DATA:  Right leg pain for 1 week that has gotten progressively worse. Lasix not decreasing swelling. PE  suspected. EXAM: CT ANGIOGRAPHY CHEST WITH CONTRAST CT ANGIOGRAPHY OF ABDOMINAL AORTA WITH ILIOFEMORAL RUNOFF TECHNIQUE: Multidetector CT imaging of the abdomen, pelvis and lower extremities was performed using the standard protocol during bolus administration of intravenous contrast. Multiplanar CT image reconstructions and MIPs were obtained to evaluate the vascular anatomy. Multidetector CT imaging of the chest was performed using the standard protocol during bolus administration of intravenous contrast. Multiplanar CT image reconstructions and MIPs were obtained to evaluate the vascular anatomy. RADIATION DOSE REDUCTION: This exam was performed according to the departmental dose-optimization program which includes automated exposure control, adjustment of the mA and/or kV according to patient size and/or use of iterative reconstruction technique. CONTRAST:  OMNIPAQUE IOHEXOL 350 MG/ML SOLN COMPARISON:  Same day chest radiograph; CT abdomen and pelvis  01/13/2023 and CT chest 12/30/2022 FINDINGS: Cardiovascular: No pericardial effusion. No pulmonary embolism. Normal caliber aorta. Aortic atherosclerosis. Mediastinum/Nodes: Trachea and esophagus are unremarkable. New mediastinal and right hilar lymphadenopathy. For example 1.0 cm pretracheal node on series 1/image 33 and 1.0 cm right hilar node on series 1/image 41. Lungs/Pleura: Emphysema. Diffuse interlobular septal thickening. There are new bilateral patchy ground-glass opacities in the dependent lungs bilaterally. No pleural effusion or pneumothorax. Musculoskeletal: No acute fracture. Review of the MIP images confirms the above findings. VASCULAR Aorta: No aneurysm or dissection. Scattered mixed density atherosclerotic plaque without hemodynamically significant stenosis. Celiac: Severe narrowing at the origin of the celiac axis. SMA: Patent. Renals: Patent left renal artery. Moderate narrowing at the origin of the right renal artery. IMA: Patent. RIGHT Lower Extremity Inflow: Scattered atherosclerotic plaque without hemodynamically significant narrowing. No aneurysm or dissection. Outflow: Occlusion of the distal superficial femoral artery with reconstitution of the popliteal artery. Runoff: Three-vessel runoff of the right lower extremity. The distal anterior tibial and peroneal arteries are atretic. Patent dorsalis pedis. LEFT Lower Extremity Inflow: Scattered atherosclerotic plaque without hemodynamically significant narrowing. No aneurysm or dissection. Outflow: Patent.  No aneurysm or dissection. Runoff: Patent three vessel runoff to the ankle. Patent dorsalis pedis. Veins: No obvious venous abnormality within the limitations of this arterial phase study. Review of the MIP images confirms the above findings. NON-VASCULAR Hepatobiliary: No acute abnormality. Pancreas: Unremarkable. Spleen: Unremarkable. Adrenals/Urinary Tract: Normal adrenal glands. Geographic hypoattenuation in the anterior left kidney is  unchanged from 01/13/2023 and compatible with infarct. No urinary calculi or hydronephrosis. Unremarkable bladder. Stomach/Bowel: Normal caliber large and small bowel. Colonic diverticulosis without diverticulitis. Appendix and stomach are within normal limits. Lymphatic: No lymphadenopathy. Reproductive: Hysterectomy. Other: No free intraperitoneal fluid or air. Musculoskeletal: No acute fracture. IMPRESSION: 1. Occlusion of the distal right superficial femoral artery with reconstitution of the popliteal artery. 2. No evidence of pulmonary embolism. 3. New bilateral patchy ground-glass opacities in the dependent lungs bilaterally, likely infectious/inflammatory. 4. New mediastinal and right hilar lymphadenopathy, favored reactive. 5. Unchanged left renal infarct. Aortic Atherosclerosis (ICD10-I70.0) and Emphysema (ICD10-J43.9). Critical Value/emergent results were called by telephone at the time of interpretation on 02/14/2023 at 6:50 pm to provider Dr. Wyn Forster, who verbally acknowledged these results. Electronically Signed   By: Minerva Fester M.D.   On: 02/14/2023 18:51   DG Chest Portable 1 View Result Date: 02/14/2023 CLINICAL DATA:  Right leg pain and swelling. EXAM: PORTABLE CHEST 1 VIEW COMPARISON:  12/30/2022. FINDINGS: Bilateral lungs appear hyperlucent with coarse bronchovascular markings, in keeping with COPD. There are atelectatic changes at the left lung base. Bilateral lungs otherwise appear clear. No overt pulmonary edema. No dense consolidation or lung collapse. Bilateral costophrenic  angles are clear. Stable cardio-mediastinal silhouette. No acute osseous abnormalities. The soft tissues are within normal limits. IMPRESSION: *No active disease.  COPD. Electronically Signed   By: Jules Schick M.D.   On: 02/14/2023 15:53   US Venous Img Lower Unilateral Right Result Date: 02/14/2023 CLINICAL DATA:  Right lower extremity pain and edema. EXAM: RIGHT LOWER EXTREMITY VENOUS DOPPLER ULTRASOUND  TECHNIQUE: Gray-scale sonography with graded compression, as well as color Doppler and duplex ultrasound were performed to evaluate the lower extremity deep venous systems from the level of the common femoral vein and including the common femoral, femoral, profunda femoral, popliteal and calf veins including the posterior tibial, peroneal and gastrocnemius veins when visible. The superficial great saphenous vein was also interrogated. Spectral Doppler was utilized to evaluate flow at rest and with distal augmentation maneuvers in the common femoral, femoral and popliteal veins. COMPARISON:  None Available. FINDINGS: Contralateral Common Femoral Vein: Respiratory phasicity is normal and symmetric with the symptomatic side. No evidence of thrombus. Normal compressibility. Common Femoral Vein: No evidence of thrombus. Normal compressibility, respiratory phasicity and response to augmentation. Saphenofemoral Junction: No evidence of thrombus. Normal compressibility and flow on color Doppler imaging. Profunda Femoral Vein: No evidence of thrombus. Normal compressibility and flow on color Doppler imaging. Femoral Vein: No evidence of thrombus. Normal compressibility, respiratory phasicity and response to augmentation. Popliteal Vein: No evidence of thrombus. Normal compressibility, respiratory phasicity and response to augmentation. Calf Veins: No evidence of thrombus. Normal compressibility and flow on color Doppler imaging. Superficial Great Saphenous Vein: No evidence of thrombus. Normal compressibility. Venous Reflux:  None. Other Findings: No evidence of superficial thrombophlebitis or abnormal fluid collection. IMPRESSION: No evidence of right lower extremity deep venous thrombosis. Electronically Signed   By: Irish Lack M.D.   On: 02/14/2023 15:40    Procedures Procedures    CRITICAL CARE Performed by: [place] Total critical care time: 35 minutes Critical care time was exclusive of separately  billable procedures and treating other patients. Critical care was necessary to treat or prevent imminent or life-threatening deterioration. Critical care was time spent personally by me on the following activities: development of treatment plan with patient and/or surrogate as well as nursing, discussions with consultants, evaluation of patient's response to treatment, examination of patient, obtaining history from patient or surrogate, ordering and performing treatments and interventions, ordering and review of laboratory studies, ordering and review of radiographic studies, pulse oximetry and re-evaluation of patient's condition.  Patient with superficial arterial occlusion with reconstitution.  Has required multiple sources including labs, CT, x-ray and medications.  Also consultation with specialist service.   Medications Ordered in ED Medications  heparin bolus via infusion 4,000 Units (has no administration in time range)    Followed by  heparin ADULT infusion 100 units/mL (25000 units/251mL) (has no administration in time range)  iohexol (OMNIPAQUE) 350 MG/ML injection 150 mL (150 mLs Intravenous Contrast Given 02/14/23 1641)  potassium chloride SA (KLOR-CON M) CR tablet 40 mEq (40 mEq Oral Given 02/14/23 1726)  acetaminophen (TYLENOL) tablet 650 mg (650 mg Oral Given 02/14/23 1947)  ipratropium-albuterol (DUONEB) 0.5-2.5 (3) MG/3ML nebulizer solution 3 mL (3 mLs Nebulization Given 02/14/23 2014)    ED Course/ Medical Decision Making/ A&P Clinical Course as of 02/14/23 1912  Tue Feb 14, 2023  1846 Superfical fem artery occlusion with reconsitution  [MK]    Clinical Course User Index [MK] Kommor, Wyn Forster, MD  Medical Decision Making Patient here with renal infarct end of October recently started on Eliquis.  Has 1 week history of pain swelling bilateral lower extremities with increased pain right foot and discoloration of the toes of the right foot.   Has been evaluated by podiatry and PCP for this.  No clear cause for patient's symptoms.  On arrival, she did note some chest tightness and shortness of breath.  Sats in the low 90s.  Workup concerning for possible PE or arterial ischemia.  Will need further workup with CT PE study and aortobifemoral I anticipate hospital admission.  Amount and/or Complexity of Data Reviewed Labs: ordered.    Details: Labs significant leukocytosis with white count of 55,000 and platelet count 57.  Patient had normal white count 1 month ago.  Question validity.  CBC was repeated.  BNP troponin both reassuring.  Mild hypokalemia with potassium of 2.8.  Oral potassium was given here. Radiology: ordered.    Details: Chest x-ray without active disease, COPD  Venous imaging of the right lower extremity without evidence of DVT  CT angio PE study without evidence of pulmonary embolism.  CT angio aortobifemoral shows occlusion of the distal right superficial femoral artery with reconstitution of the popliteal artery.  New groundglass opacities bilaterally likely infectious versus inflammatory ECG/medicine tests: ordered.    Details: EKG shows sinus or ectopic atrial rhythm with PVCs. Discussion of management or test interpretation with external provider(s): On recheck, patient resting comfortably.  Tylenol given for headache, improved.  Patient does not have supplemental oxygen requirement.  She was brought in initially on 2 L by nasal cannula.  Patient was satting upper 80s to lower 90s with oxygen on.  Oxygen was DC'd after PE study negative she remains in the low 90s on room air.  Denies any chest pain or shortness of breath at this time.   Consulted Dr. Randie Heinz with vascular.  Recommends patient to be started on heparin with hospital admission and transfer to [facility] with plan for angiogram tomorrow.  Consulted [facility] hospitalist, Dr. Lazarus Salines discussed findings.  Will arrange patient's hospital admission and transfer to [facility]  Risk OTC drugs. Prescription drug management.           Final Clinical Impression(s) / ED Diagnoses Final diagnoses:  Superficial femoral artery occlusion [facility])    Rx / DC Orders ED Discharge Orders     None         Pauline Aus, PA-C 02/14/23 2131    Bethann Berkshire, MD 02/15/23 1609

## 2023-02-14 NOTE — ED Triage Notes (Signed)
Pt c/o right leg pain x one week that has gotten progressively worse; pt has significant swelling to feet   Pt states she takes lasix but has not noticed any decrease in swelling

## 2023-02-14 NOTE — Consult Note (Signed)
PHARMACY - ANTICOAGULATION CONSULT NOTE  Pharmacy Consult for IV Heparin Indication:  Occlusion superficial femoral artery  Patient Measurements: Height: 5\' 7"  (170.2 cm) Weight: 78 kg (172 lb) IBW/kg (Calculated) : 61.6 Heparin Dosing Weight: 77.3 kg  Labs: Recent Labs    02/14/23 1413 02/14/23 1557  HGB 11.7*  --   HCT 37.1  --   PLT 57*  --   CREATININE 1.05*  --   TROPONINIHS 12 12    Estimated Creatinine Clearance: 46.8 mL/min (A) (by C-G formula based on SCr of 1.05 mg/dL (H)).  Medical History: Past Medical History:  Diagnosis Date   Asthma    COPD (chronic obstructive pulmonary disease) (HCC)    Coronary artery disease    Cough    " ALL MY LIFE "   Dyspnea    GERD (gastroesophageal reflux disease)    History of blood clots    patient had a blood clot in her kidney 01/03/2023.  pt is on Eliquis   Hypertension    NSTEMI (non-ST elevated myocardial infarction) (HCC) 09/01/2016   DES to Cx/OM bifurcation   Unstable angina (HCC) 08/2016    Medications:  Apixaban 5 mg BID prior to admission, last dose unknown  Assessment: 77 y/o F with medical history as above and including history of blood clots on apixaban presenting with right leg pain with swelling. 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 Pharmacy consulted to initiate and manage heparin infusion for occlusion superficial femoral artery.  Baseline aPTT, INR, heparin level are pending. Baseline Hgb 11.7 (13.4 on 01/13/23), platelets 57 (338 on 01/13/23). White count is profoundly elevated and this appears to be new.   Goal of Therapy:  Heparin level 0.3-0.7 units/ml aPTT 66 - 102 seconds Monitor platelets by anticoagulation protocol: Yes   Plan:  --Heparin 4000 unit IV bolus followed by continuous infusion at 1250 units/hr --Check aPTT 8 hours from initiation of infusion. Anticipate interference of apixaban on anti-Xa level. Switch  over to anti-Xa level monitoring when correlation established --Daily CBC per protocol; monitor platelets closely given new thrombocytopenia. Patient has had recent heparin exposure.  Tressie Ellis 02/14/2023,8:44 PM

## 2023-02-14 NOTE — ED Notes (Signed)
Date and time results received: 02/14/23 2322 (use smartphrase ".now" to insert current time)  Test: PO2 Critical Value: <31  Name of Provider Notified: Segars  Orders Received? Or Actions Taken?: NA

## 2023-02-15 DIAGNOSIS — D72829 Elevated white blood cell count, unspecified: Secondary | ICD-10-CM | POA: Diagnosis not present

## 2023-02-15 DIAGNOSIS — I70209 Unspecified atherosclerosis of native arteries of extremities, unspecified extremity: Secondary | ICD-10-CM | POA: Diagnosis not present

## 2023-02-15 DIAGNOSIS — C9200 Acute myeloblastic leukemia, not having achieved remission: Secondary | ICD-10-CM | POA: Diagnosis present

## 2023-02-15 DIAGNOSIS — D6869 Other thrombophilia: Secondary | ICD-10-CM | POA: Diagnosis not present

## 2023-02-15 DIAGNOSIS — I743 Embolism and thrombosis of arteries of the lower extremities: Secondary | ICD-10-CM | POA: Diagnosis not present

## 2023-02-15 LAB — CBC WITH DIFFERENTIAL/PLATELET
Abs Immature Granulocytes: 10.9 10*3/uL — ABNORMAL HIGH (ref 0.00–0.07)
Abs Immature Granulocytes: 0 10*3/uL (ref 0.00–0.07)
Basophils Absolute: 0 10*3/uL (ref 0.0–0.1)
Basophils Absolute: 0 10*3/uL (ref 0.0–0.1)
Basophils Relative: 0 %
Basophils Relative: 0 %
Eosinophils Absolute: 0.6 10*3/uL — ABNORMAL HIGH (ref 0.0–0.5)
Eosinophils Absolute: 0.2 10*3/uL (ref 0.0–0.5)
Eosinophils Relative: 1 %
Eosinophils Relative: 0 %
HCT: 31.2 % — ABNORMAL LOW (ref 36.0–46.0)
HCT: 32.6 % — ABNORMAL LOW (ref 36.0–46.0)
Hemoglobin: 9.9 g/dL — ABNORMAL LOW (ref 12.0–15.0)
Hemoglobin: 10.3 g/dL — ABNORMAL LOW (ref 12.0–15.0)
Immature Granulocytes: 0 %
Lymphocytes Relative: 31 %
Lymphocytes Relative: 14 %
Lymphs Abs: 18.8 10*3/uL — ABNORMAL HIGH (ref 0.7–4.0)
Lymphs Abs: 7 10*3/uL — ABNORMAL HIGH (ref 0.7–4.0)
MCH: 30.8 pg (ref 26.0–34.0)
MCH: 30.6 pg (ref 26.0–34.0)
MCHC: 31.7 g/dL (ref 30.0–36.0)
MCHC: 31.6 g/dL (ref 30.0–36.0)
MCV: 97.2 fL (ref 80.0–100.0)
MCV: 96.7 fL (ref 80.0–100.0)
Metamyelocytes Relative: 3 %
Monocytes Absolute: 16.4 10*3/uL — ABNORMAL HIGH (ref 0.1–1.0)
Monocytes Absolute: 16.7 10*3/uL — ABNORMAL HIGH (ref 0.1–1.0)
Monocytes Relative: 27 %
Monocytes Relative: 34 %
Myelocytes: 6 %
Neutro Abs: 7.9 10*3/uL — ABNORMAL HIGH (ref 1.7–7.7)
Neutro Abs: 25 10*3/uL — ABNORMAL HIGH (ref 1.7–7.7)
Neutrophils Relative %: 13 %
Neutrophils Relative %: 52 %
Other: 10 %
Platelets: 57 10*3/uL — ABNORMAL LOW (ref 150–400)
Platelets: 57 10*3/uL — ABNORMAL LOW (ref 150–400)
Promyelocytes Relative: 9 %
RBC: 3.21 MIL/uL — ABNORMAL LOW (ref 3.87–5.11)
RBC: 3.37 MIL/uL — ABNORMAL LOW (ref 3.87–5.11)
RDW: 19.8 % — ABNORMAL HIGH (ref 11.5–15.5)
RDW: 19.9 % — ABNORMAL HIGH (ref 11.5–15.5)
WBC: 60.6 10*3/uL (ref 4.0–10.5)
WBC: 49 10*3/uL — ABNORMAL HIGH (ref 4.0–10.5)
nRBC: 0.2 % (ref 0.0–0.2)
nRBC: 0.2 % (ref 0.0–0.2)

## 2023-02-15 LAB — BASIC METABOLIC PANEL
Anion gap: 9 (ref 5–15)
BUN: 6 mg/dL — ABNORMAL LOW (ref 8–23)
CO2: 24 mmol/L (ref 22–32)
Calcium: 7.9 mg/dL — ABNORMAL LOW (ref 8.9–10.3)
Chloride: 105 mmol/L (ref 98–111)
Creatinine, Ser: 0.95 mg/dL (ref 0.44–1.00)
GFR, Estimated: >60 mL/min (ref >60)
Glucose, Bld: 116 mg/dL — ABNORMAL HIGH (ref 70–99)
Potassium: 3.9 mmol/L (ref 3.5–5.1)
Sodium: 138 mmol/L (ref 135–145)

## 2023-02-15 LAB — RESPIRATORY PANEL BY PCR

## 2023-02-15 LAB — APTT
aPTT: 63 s — ABNORMAL HIGH (ref 24–36)
aPTT: 63 s — ABNORMAL HIGH (ref 24–36)
aPTT: 83 s — ABNORMAL HIGH (ref 24–36)

## 2023-02-15 LAB — PATHOLOGIST SMEAR REVIEW

## 2023-02-15 LAB — TYPE AND SCREEN
ABO/RH(D): O POS
Antibody Screen: NEGATIVE

## 2023-02-15 LAB — TECHNOLOGIST SMEAR REVIEW: Plt Morphology: DECREASED

## 2023-02-15 LAB — LACTATE DEHYDROGENASE: LDH: 251 U/L — ABNORMAL HIGH (ref 98–192)

## 2023-02-15 LAB — VITAMIN B12: Vitamin B-12: 1991 pg/mL — ABNORMAL HIGH (ref 180–914)

## 2023-02-15 LAB — HEPARIN LEVEL (UNFRACTIONATED): Heparin Unfractionated: >1.1 [IU]/mL — ABNORMAL HIGH (ref 0.30–0.70)

## 2023-02-15 LAB — PHOSPHORUS: Phosphorus: 2.7 mg/dL (ref 2.5–4.6)

## 2023-02-15 LAB — MAGNESIUM: Magnesium: 1.6 mg/dL — ABNORMAL LOW (ref 1.7–2.4)

## 2023-02-15 LAB — URIC ACID: Uric Acid, Serum: 6 mg/dL (ref 2.5–7.1)

## 2023-02-15 MED ORDER — ALBUTEROL SULFATE HFA 108 (90 BASE) MCG/ACT IN AERS: 2.0000 | INHALATION_SPRAY | RESPIRATORY_TRACT | Status: DC | PRN

## 2023-02-15 MED ORDER — POTASSIUM & SODIUM PHOSPHATES 280-160-250 MG PO PACK
2.0000 | PACK | Freq: Once | ORAL | Status: AC
  Administered 2023-02-15: 2 via ORAL
  Filled 2023-02-15 (×2): qty 2

## 2023-02-15 MED ORDER — ALBUTEROL SULFATE (2.5 MG/3ML) 0.083% IN NEBU
2.5000 mg | INHALATION_SOLUTION | RESPIRATORY_TRACT | Status: DC | PRN
  Administered 2023-02-17 – 2023-02-18 (×2): 2.5 mg via RESPIRATORY_TRACT
  Filled 2023-02-15 (×2): qty 3

## 2023-02-15 MED ORDER — POTASSIUM CHLORIDE 10 MEQ/100ML IV SOLN
10.0000 meq | INTRAVENOUS | Status: AC
  Administered 2023-02-15 (×4): 10 meq via INTRAVENOUS
  Filled 2023-02-15 (×2): qty 100

## 2023-02-15 MED ORDER — ALLOPURINOL 300 MG PO TABS
300.0000 mg | ORAL_TABLET | Freq: Every day | ORAL | Status: DC
  Administered 2023-02-15 – 2023-02-21 (×7): 300 mg via ORAL
  Filled 2023-02-15 (×7): qty 1

## 2023-02-15 MED ORDER — HYDROXYUREA 500 MG PO CAPS
1500.0000 mg | ORAL_CAPSULE | Freq: Every day | ORAL | Status: DC
  Administered 2023-02-15 – 2023-02-16 (×2): 1500 mg via ORAL
  Filled 2023-02-15 (×3): qty 3

## 2023-02-15 MED ORDER — MAGNESIUM SULFATE 4 GM/100ML IV SOLN
4.0000 g | Freq: Once | INTRAVENOUS | Status: AC
  Administered 2023-02-15: 4 g via INTRAVENOUS
  Filled 2023-02-15: qty 100

## 2023-02-15 NOTE — Consult Note (Addendum)
PHARMACY - ANTICOAGULATION CONSULT NOTE  Pharmacy Consult for IV Heparin Indication:  Occlusion superficial femoral artery  Patient Measurements: Height: 5\' 7"  (170.2 cm) Weight: 78 kg (172 lb) IBW/kg (Calculated) : 61.6 Heparin Dosing Weight: 77.3 kg  Labs: Recent Labs    02/14/23 1413 02/14/23 1557 02/14/23 2041 02/14/23 2100 02/15/23 0605  HGB 11.7*  --  11.0*  --  9.9*  HCT 37.1  --  34.6*  --  31.2*  PLT 57*  --  71*  --  57*  APTT  --   --   --  32 63*  LABPROT  --   --   --  18.5*  --   INR  --   --   --  1.5*  --   HEPARINUNFRC  --   --   --  >1.10* >1.10*  CREATININE 1.05*  --   --   --  0.95  TROPONINIHS 12 12  --   --   --     Estimated Creatinine Clearance: 51.7 mL/min (by C-G formula based on SCr of 0.95 mg/dL).  Medical History: Past Medical History:  Diagnosis Date   Asthma    COPD (chronic obstructive pulmonary disease) (HCC)    Coronary artery disease    Cough    " ALL MY LIFE "   Dyspnea    GERD (gastroesophageal reflux disease)    History of blood clots    patient had a blood clot in her kidney 01/03/2023.  pt is on Eliquis   Hypertension    NSTEMI (non-ST elevated myocardial infarction) (HCC) 09/01/2016   DES to Cx/OM bifurcation   Unstable angina (HCC) 08/2016    Medications:  Apixaban 5 mg BID prior to admission, last dose unknown  Assessment: 77 y/o F with medical history as above and including history of blood clots on apixaban presenting with right leg pain with swelling. 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 Pharmacy consulted to initiate and manage heparin infusion for occlusion superficial femoral artery.  HL >1.10- elevated due to Eliquis aPTT 63- slightly subtherapeutic Hgb 9.9    platelets 57  Goal of Therapy:  Heparin level 0.3-0.7 units/ml aPTT 66 - 102 seconds Monitor platelets by anticoagulation protocol: Yes   Plan:  Increase heparin infusion  to 1400 units/hr aPTT in 8 hours  Continue to monitor H&H and platelets.  Judeth Cornfield, PharmD Clinical Pharmacist 02/15/2023 8:09 AM

## 2023-02-15 NOTE — Progress Notes (Signed)
Addendum to my notes at 7:50 PM I have reviewed her peripheral blood smear myself and noted circulating blasts greater than 10%.  Additional plan of care to my notes from this afternoon #1 acute leukemia I have a long discussion with the patient, her sister and her daughter who are by her bedside.  Overall, her presentation is consistent with diagnosis of acute myelogenous leukemia.  Subtype is unknown.  We discussed risk and benefits of transferring her to tertiary center versus keeping her here.  The patient is made aware that in general, we are not equipped to treat patients aggressively for acute leukemia.  The patient declined transfer.  She agrees to proceed with bone marrow aspirate and biopsy. Potentially, she is interested in systemic chemotherapy such as hypomethylating agents if that could be offered locally.  We discussed risk and benefits of treating her in [place] [facility] versus at any [facility]  We discussed risk and benefits of starting her on hydroxyurea and she is in agreement.  I will start her on 1500 mg daily starting tonight in an effort to side to reduce her white blood cell count.  She is aware that she could be more pancytopenic moving forward.  We discussed risk and benefits of port placement and she agreed to proceed.  I recommend aggressive IV fluid hydration overnight to improve her blood flow.  This would be at slight risk of more leg swelling but could reduce her risk of lower limb ischemia.  I will also start her on allopurinol for tumor lysis prophylaxis  #2 Critical limb ischemia I suspect it could be precipitated by presence of blasts causing circulation issues.  She will continue medical management with IV heparin.  As above, I think it is important that we try to side to reduce her with Hydrea and to increase IV fluids.  The patient is not capable of drinking more than 60 ounces of liquid per day  #3 goals of care discussion The patient is aware of poor prognosis  associated with acute leukemia.  She is interested for systemic chemotherapy that could be offered locally.  We discussed importance of advanced directives and living will.  The patient would like her CODE STATUS will be full code.  She appointed her daughter, [name] as her medical healthcare power of attorney in the event that she is not able to make medical decisions for herself  Artis Delay, MD 7:57 PM I spent an additional 30 minutes discussing all the above with the patient and family.

## 2023-02-15 NOTE — ED Notes (Signed)
Report given to Angela, RN.

## 2023-02-15 NOTE — Consult Note (Signed)
[facility] [place]  ASSESSMENT / PLAN: 77 y.o. female with likely embolic occlusion of right superficial femoral artery in setting of recent renal infarct from embolism. This is causing cyanosis of the toes, but no other ischemic symptoms. Thankfully her foot is not acutely threatened, and appears better on anticoagulation with heparin.   She has significant leukocytosis and thrombocytopenia.  Her thrombocytopenia makes vascular intervention risky at this moment.  This will need further workup before we proceed with intervention.  Along closely.  CHIEF COMPLAINT: Bluish discoloration of right toes  HISTORY OF PRESENT ILLNESS: Lisa [name] is a 77 y.o. female known to the vascular surgery service for recent renal infarction from likely embolism without clear source.  The patient presented to care for evaluation of right foot pain.  This began about 2 weeks ago when she noticed cyanotic changes in her toes.  Patient was started on heparin on admission to the hospital.  She was found to be significantly thrombocytopenic and with marked leukocytosis.  Overall presentation was concerning for blood cancer.  The patient's right foot has improved on IV heparin.  She has motor function in the foot.  She has sensory function in the foot.  The foot is not terribly painful to her.  We reviewed her clinical picture and my concerns for intervention at this moment.  She is understanding.  Past Medical History:  Diagnosis Date   Asthma    COPD (chronic obstructive pulmonary disease) (HCC)    Coronary artery disease    Cough    " ALL MY LIFE "   Dyspnea    GERD (gastroesophageal reflux disease)    History of blood clots    patient had a blood clot in her kidney 01/03/2023.  pt is on Eliquis   Hypertension    NSTEMI (non-ST elevated myocardial infarction) (HCC) 09/01/2016   DES to Cx/OM bifurcation   Unstable angina (HCC) 08/2016    Past Surgical History:  Procedure  Laterality Date   ABDOMINAL HYSTERECTOMY     COLONOSCOPY WITH PROPOFOL N/A 01/16/2023   Procedure: COLONOSCOPY WITH PROPOFOL;  Surgeon: Lanelle Bal, DO;  Location: [facility];  Service: Endoscopy;  Laterality: N/A;  845am, asa 3   CORONARY STENT INTERVENTION  09/05/2016   Successful complex PCI of the circumflex/OM bifurcation using a Synergy DES   CORONARY STENT INTERVENTION N/A 09/05/2016   Procedure: Coronary Stent Intervention;  Surgeon: Tonny Bollman, MD;  Location: [facility] INVASIVE CV LAB;  Service: Cardiovascular;  Laterality: N/A;   LEFT HEART CATH AND CORONARY ANGIOGRAPHY N/A 09/02/2016   Procedure: Left Heart Cath and Coronary Angiography;  Surgeon: Corky Crafts, MD;  Location: [facility] INVASIVE CV LAB;  Service: Cardiovascular;  Laterality: N/A;   POLYPECTOMY  01/16/2023   Procedure: POLYPECTOMY;  Surgeon: Lanelle Bal, DO;  Location: [facility];  Service: Endoscopy;;    Family History  Problem Relation Age of Onset   Asthma Mother    Heart attack Father 8   Heart attack Son 71   Prostate cancer Brother     Social History   Socioeconomic History   Marital status: Divorced    Spouse name: Not on file   Number of children: Not on file   Years of education: Not on file   Highest education level: Not on file  Occupational History   Not on file  Tobacco Use   Smoking status: Every Day    Current packs/day: 1.00    Average packs/day:  1 pack/day for 50.0 years (50.0 ttl pk-yrs)    Types: Cigarettes   Smokeless tobacco: Never  Vaping Use   Vaping status: Never Used  Substance and Sexual Activity   Alcohol use: Not Currently    Comment: occasional   Drug use: No   Sexual activity: Yes  Other Topics Concern   Not on file  Social History Narrative   Not on file   Social Drivers of Health   Financial Resource Strain: Low Risk  (07/14/2022)   Received from [facility], [facility]   Overall Financial Resource Strain (CARDIA)    Difficulty of  Paying Living Expenses: Not hard at all  Food Insecurity: No Food Insecurity (12/28/2022)   Hunger Vital Sign    Worried About Running Out of Food in the Last Year: Never true    Ran Out of Food in the Last Year: Never true  Transportation Needs: No Transportation Needs (12/28/2022)   PRAPARE - Administrator, Civil Service (Medical): No    Lack of Transportation (Non-Medical): No  Physical Activity: Not on file  Stress: Not on file  Social Connections: Not on file  Intimate Partner Violence: Not At Risk (12/28/2022)   Humiliation, Afraid, Rape, and Kick questionnaire    Fear of Current or Ex-Partner: No    Emotionally Abused: No    Physically Abused: No    Sexually Abused: No    Allergies  Allergen Reactions   Brilinta [Ticagrelor] Shortness Of Breath   Penicillins Anaphylaxis    immediate rash, facial/tongue/throat swelling, SOB or lightheadedness with hypotension severe rash involving mucus membranes or skin necrosis   Ranexa [Ranolazine] Shortness Of Breath   Sulfa Antibiotics Anaphylaxis and Rash    Blistering rash on hands and feet   Crestor [Rosuvastatin] Other (See Comments)    Myalgias    Current Facility-Administered Medications  Medication Dose Route Frequency Provider Last Rate Last Admin   0.9 %  sodium chloride infusion   Intravenous Continuous Segars, Christiane Ha, MD   Stopped at 02/15/23 0803   acetaminophen (TYLENOL) tablet 1,000 mg  1,000 mg Oral Q6H PRN Dolly Rias, MD       albuterol (PROVENTIL) (2.5 MG/3ML) 0.083% nebulizer solution 2.5 mg  2.5 mg Nebulization Q4H PRN Segars, Christiane Ha, MD       diazepam (VALIUM) tablet 2 mg  2 mg Oral Daily PRN Dolly Rias, MD       heparin ADULT infusion 100 units/mL (25000 units/227mL)  1,400 Units/hr Intravenous Continuous Johnson, Clanford L, MD 14 mL/hr at 02/15/23 1457 1,400 Units/hr at 02/15/23 1457   metoprolol succinate (TOPROL-XL) 24 hr tablet 25 mg  25 mg Oral Daily Segars, Christiane Ha, MD        oxyCODONE (Oxy IR/ROXICODONE) immediate release tablet 2.5 mg  2.5 mg Oral Q6H PRN Dolly Rias, MD       Or   oxyCODONE (Oxy IR/ROXICODONE) immediate release tablet 2.5 mg  2.5 mg Oral Q6H PRN Segars, Christiane Ha, MD       pantoprazole (PROTONIX) EC tablet 40 mg  40 mg Oral Daily Segars, Christiane Ha, MD   40 mg at 02/15/23 0936   polyethylene glycol (MIRALAX / GLYCOLAX) packet 17 g  17 g Oral Daily PRN Dolly Rias, MD       sodium chloride flush (NS) 0.9 % injection 3 mL  3 mL Intravenous Q12H Dolly Rias, MD        PHYSICAL EXAM Vitals:   02/15/23 1030 02/15/23 1045 02/15/23 1115  02/15/23 1300  BP: (!) 110/48 (!) 107/49 (!) 106/51 126/62  Pulse: 68 68 69 68  Resp: 20 19 20 19   Temp:      TempSrc:      SpO2: 94% 92% 91% 92%  Weight:      Height:       Elderly woman in no distress Regular rate and rhythm Unlabored breathing Doppler flow in right dorsalis pedis and posterior tibial artery Distal phalanxes of the right foot cyanotic Normal motor and sensory function of the right foot  PERTINENT LABORATORY AND RADIOLOGIC DATA  Most recent CBC    Latest Ref Rng & Units 02/15/2023    6:05 AM 02/14/2023    8:41 PM 02/14/2023    2:13 PM  CBC  WBC 4.0 - 10.5 K/uL 60.6  63.0  55.9   Hemoglobin 12.0 - 15.0 g/dL 9.9  16.1  09.6   Hematocrit 36.0 - 46.0 % 31.2  34.6  37.1   Platelets 150 - 400 K/uL 57  71  57      Most recent CMP    Latest Ref Rng & Units 02/15/2023    6:05 AM 02/14/2023    8:41 PM 02/14/2023    2:13 PM  CMP  Glucose 70 - 99 mg/dL 045   409   BUN 8 - 23 mg/dL 6   7   Creatinine 8.11 - 1.00 mg/dL 9.14   7.82   Sodium 956 - 145 mmol/L 138   141   Potassium 3.5 - 5.1 mmol/L 3.9   2.8   Chloride 98 - 111 mmol/L 105   105   CO2 22 - 32 mmol/L 24   27   Calcium 8.9 - 10.3 mg/dL 7.9   8.7   Total Protein 6.5 - 8.1 g/dL  6.8    Total Bilirubin <1.2 mg/dL  0.5    Alkaline Phos 38 - 126 U/L  107    AST 15 - 41 U/L  15    ALT 0 - 44 U/L  11      Renal  function Estimated Creatinine Clearance: 51.7 mL/min (by C-G formula based on SCr of 0.95 mg/dL).  Hgb A1c MFr Bld (%)  Date Value  09/01/2016 5.9 (H)    LDL Cholesterol  Date Value Ref Range Status  12/29/2022 74 0 - 99 mg/dL Final    Comment:           Total Cholesterol/HDL:CHD Risk Coronary Heart Disease Risk Table                     Men   Women  1/2 Average Risk   3.4   3.3  Average Risk       5.0   4.4  2 X Average Risk   9.6   7.1  3 X Average Risk  23.4   11.0        Use the calculated Patient Ratio above and the CHD Risk Table to determine the patient's CHD Risk.        ATP III CLASSIFICATION (LDL):  <100     mg/dL   Optimal  213-086  mg/dL   Near or Above                    Optimal  130-159  mg/dL   Borderline  578-469  mg/dL   High  >629     mg/dL   Very High Performed at [facility], [address] [place], [place] [address]     CT angiogram of abdomen pelvis with runoff to the toes personally reviewed There is a right superficial femoral artery occlusion at Hunter's canal with reconstitution of the popliteal artery.  This appears embolic or thrombotic in nature.   Rande Brunt. Lenell Antu, MD FACS [facility] Phone Number: [phone] 02/15/2023 3:08 PM   Total time spent on preparing this encounter including chart review, data review, collecting history, examining the patient, coordinating care for this established patient, 40 minutes.  Portions of this report may have been transcribed using voice recognition software.  Every effort has been made to ensure accuracy; however, inadvertent computerized transcription errors may still be present.

## 2023-02-15 NOTE — Consult Note (Signed)
PHARMACY - ANTICOAGULATION CONSULT NOTE  Pharmacy Consult for IV Heparin Indication:  Occlusion superficial femoral artery  Patient Measurements: Height: 5\' 7"  (170.2 cm) Weight: 78 kg (172 lb) IBW/kg (Calculated) : 61.6 Heparin Dosing Weight: 77.3 kg  Labs: Recent Labs    02/14/23 1413 02/14/23 1557 02/14/23 2041 02/14/23 2100 02/15/23 0605 02/15/23 1534 02/15/23 2051  HGB 11.7*  --  11.0*  --  9.9* 10.3*  --   HCT 37.1  --  34.6*  --  31.2* 32.6*  --   PLT 57*  --  71*  --  57* 57*  --   APTT  --   --   --  32 63*  --  83*  LABPROT  --   --   --  18.5*  --   --   --   INR  --   --   --  1.5*  --   --   --   HEPARINUNFRC  --   --   --  >1.10* >1.10*  --   --   CREATININE 1.05*  --   --   --  0.95  --   --   TROPONINIHS 12 12  --   --   --   --   --     Estimated Creatinine Clearance: 51.7 mL/min (by C-G formula based on SCr of 0.95 mg/dL).  Medical History: Past Medical History:  Diagnosis Date   Asthma    COPD (chronic obstructive pulmonary disease) (HCC)    Coronary artery disease    Cough    " ALL MY LIFE "   Dyspnea    GERD (gastroesophageal reflux disease)    History of blood clots    patient had a blood clot in her kidney 01/03/2023.  pt is on Eliquis   Hypertension    NSTEMI (non-ST elevated myocardial infarction) (HCC) 09/01/2016   DES to Cx/OM bifurcation   Unstable angina (HCC) 08/2016    Medications:  Apixaban 5 mg BID prior to admission, last dose unknown  Assessment: 77 y/o F with medical history as above and including history of blood clots on apixaban presenting with right leg pain with swelling. 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 Pharmacy consulted to initiate and manage heparin infusion for occlusion superficial femoral artery.  aPTT 83 sec is therapeutic after rate increase to 1400 units/hr (confirmed rate with RN).  Rate adjusted 0830, patient transferred to [facility]  at time 8 hour level was due and was not reordered on transfer.  No infusion issues or bleeding noted.  Goal of Therapy:  Heparin level 0.3-0.7 units/ml aPTT 66 - 102 seconds Monitor platelets by anticoagulation protocol: Yes   Plan:  Continue heparin infusion at 1400 units/hr Daily aPTT/heparin level, CBC Continue to monitor H&H and platelets.  Trixie Rude, PharmD Clinical Pharmacist 02/15/2023  10:10 PM

## 2023-02-15 NOTE — Plan of Care (Signed)

## 2023-02-15 NOTE — Progress Notes (Signed)
[facility] .[facility] denies any pain, MD aware,CCMD called. CHG bath given,no further needs at this time

## 2023-02-15 NOTE — Consult Note (Signed)
[facility] CONSULT NOTE  Patient Care Team: Kirstie Peri, MD as PCP - General (Internal Medicine) Pricilla Riffle, MD as PCP - Cardiology (Cardiology)  ASSESSMENT & PLAN Leukocytosis, mild anemia and acute thrombocytopenia Her overall presentation is worrisome for undiagnosed acute leukemia Unfortunately, I was not able to get access to her peripheral smear from [facility] which has not been read Flow cytometry was ordered and that was also not resulted I will order repeat blood work and smear here today We discussed the gravity of his situation and the need for bone marrow aspirate and biopsy Due to the timing of the day, I will order bone marrow biopsy to be done by interventional radiologist over the next 2 days If the peripheral blood smear is suspicious for malignancy, I think it is reasonable to request potential transfer to tertiary center for further management  Critical limb ischemia The patient have significant history of peripheral vascular disease Unfortunately, she continues to smoke I will defer to vascular surgeon for management Continue IV heparin for now  Recent renal infarct Again, due to thromboembolic phenomenon She will resume anticoagulation therapy as soon as feasible after surgery Thankfully, her renal function is stable  Tobacco abuse The patient need to quit smoking  Discharge planning Unknown, she will likely be here for few days Plan of care is discussed with primary service   All questions were answered. The patient knows to call the clinic with any problems, questions or concerns. No barriers to learning was detected. The total time spent in the appointment was 80 minutes encounter with patients including review of chart and various tests results, discussions about plan of care and coordination of care plan  Artis Delay, MD 02/15/2023 3:09 PM  CHIEF COMPLAINTS/PURPOSE OF CONSULTATION:  Extreme leukocytosis, worrisome for  leukemia  HISTORY OF PRESENTING ILLNESS:  Lisa [name] 77 y.o. female is seen at the request by emergency room physician from [facility] last night to evaluate this patient who presented with abnormal CBC.  The patient have recurrent hospitalizations over the last few months. Most recently, at the end of October, she was found to have renal infarct and was placed on Eliquis.  She briefly went to the emergency department last month with rectal bleeding that subsequently resolved.   She underwent colonoscopy with benign biopsies but was found to have diverticular disease. She denies missing doses of her anticoagulation therapy.  The patient has been smoking half a pack of cigarettes per day since her teenage years. She also have history of COPD with chronic cough but denies need for chronic oxygen therapy Approximately a week and a half ago, she noted right lower extremity pain Imaging study from [facility] show evidence of right SFA occlusion.  Her case was discussed with vascular surgery team with plan for revascularization procedure. Incidentally, her CBC was noted to be grossly abnormal. Her baseline labs from January 13, 2023 showed white count of 7.4, hemoglobin of 13.4 and platelet count of 338.  Yesterday, her admission labs show a white count of 55.9, hemoglobin of 11.7 and a platelet count of 57,000.  That was repeated twice and her white count remained around 60,000.  Hemoglobin dropped to 9.9 and platelet count is stable at 57,000. She have history of intermittent epistaxis while on Eliquis but not severe. She denies recent bruising/bleeding, such as hematuria, melena or hematochezia She denies fever or chills.  No signs or symptoms of infection.  She has chronic cough due to  COPD  MEDICAL HISTORY:  Past Medical History:  Diagnosis Date   Asthma    COPD (chronic obstructive pulmonary disease) (HCC)    Coronary artery disease    Cough    " ALL MY LIFE "   Dyspnea    GERD  (gastroesophageal reflux disease)    History of blood clots    patient had a blood clot in her kidney 01/03/2023.  pt is on Eliquis   Hypertension    NSTEMI (non-ST elevated myocardial infarction) (HCC) 09/01/2016   DES to Cx/OM bifurcation   Unstable angina (HCC) 08/2016    SURGICAL HISTORY: Past Surgical History:  Procedure Laterality Date   ABDOMINAL HYSTERECTOMY     COLONOSCOPY WITH PROPOFOL N/A 01/16/2023   Procedure: COLONOSCOPY WITH PROPOFOL;  Surgeon: Lanelle Bal, DO;  Location: [facility];  Service: Endoscopy;  Laterality: N/A;  845am, asa 3   CORONARY STENT INTERVENTION  09/05/2016   Successful complex PCI of the circumflex/OM bifurcation using a Synergy DES   CORONARY STENT INTERVENTION N/A 09/05/2016   Procedure: Coronary Stent Intervention;  Surgeon: Tonny Bollman, MD;  Location: [facility] INVASIVE CV LAB;  Service: Cardiovascular;  Laterality: N/A;   LEFT HEART CATH AND CORONARY ANGIOGRAPHY N/A 09/02/2016   Procedure: Left Heart Cath and Coronary Angiography;  Surgeon: Corky Crafts, MD;  Location: [facility] INVASIVE CV LAB;  Service: Cardiovascular;  Laterality: N/A;   POLYPECTOMY  01/16/2023   Procedure: POLYPECTOMY;  Surgeon: Lanelle Bal, DO;  Location: [facility];  Service: Endoscopy;;    SOCIAL HISTORY: Social History   Socioeconomic History   Marital status: Divorced    Spouse name: Not on file   Number of children: Not on file   Years of education: Not on file   Highest education level: Not on file  Occupational History   Not on file  Tobacco Use   Smoking status: Every Day    Current packs/day: 1.00    Average packs/day: 1 pack/day for 50.0 years (50.0 ttl pk-yrs)    Types: Cigarettes   Smokeless tobacco: Never  Vaping Use   Vaping status: Never Used  Substance and Sexual Activity   Alcohol use: Not Currently    Comment: occasional   Drug use: No   Sexual activity: Yes  Other Topics Concern   Not on file  Social History Narrative   Not  on file   Social Drivers of Health   Financial Resource Strain: Low Risk  (07/14/2022)   [facility]   Overall Financial Resource Strain (CARDIA)    Difficulty of Paying Living Expenses: Not hard at all  Food Insecurity: No Food Insecurity (12/28/2022)   Hunger Vital Sign    Worried About Running Out of Food in the Last Year: Never true    Ran Out of Food in the Last Year: Never true  Transportation Needs: No Transportation Needs (12/28/2022)   PRAPARE - Administrator, Civil Service (Medical): No    Lack of Transportation (Non-Medical): No  Physical Activity: Not on file  Stress: Not on file  Social Connections: Not on file  Intimate Partner Violence: Not At Risk (12/28/2022)   Humiliation, Afraid, Rape, and Kick questionnaire    Fear of Current or Ex-Partner: No    Emotionally Abused: No    Physically Abused: No    Sexually Abused: No    FAMILY HISTORY: Family History  Problem Relation Age of Onset   Asthma Mother  Heart attack Father 54   Heart attack Son 64   Prostate cancer Brother     ALLERGIES:  is allergic to brilinta [ticagrelor], penicillins, ranexa [ranolazine], sulfa antibiotics, and crestor [rosuvastatin].  MEDICATIONS:  Current Facility-Administered Medications  Medication Dose Route Frequency Provider Last Rate Last Admin   0.9 %  sodium chloride infusion   Intravenous Continuous Dolly Rias, MD   Stopped at 02/15/23 5409   acetaminophen (TYLENOL) tablet 1,000 mg  1,000 mg Oral Q6H PRN Dolly Rias, MD       albuterol (PROVENTIL) (2.5 MG/3ML) 0.083% nebulizer solution 2.5 mg  2.5 mg Nebulization Q4H PRN Segars, Christiane Ha, MD       diazepam (VALIUM) tablet 2 mg  2 mg Oral Daily PRN Dolly Rias, MD       heparin ADULT infusion 100 units/mL (25000 units/224mL)  1,400 Units/hr Intravenous Continuous Johnson, Clanford L, MD 14 mL/hr at 02/15/23 1457 1,400 Units/hr at 02/15/23 1457   metoprolol  succinate (TOPROL-XL) 24 hr tablet 25 mg  25 mg Oral Daily Segars, Christiane Ha, MD       oxyCODONE (Oxy IR/ROXICODONE) immediate release tablet 2.5 mg  2.5 mg Oral Q6H PRN Dolly Rias, MD       Or   oxyCODONE (Oxy IR/ROXICODONE) immediate release tablet 2.5 mg  2.5 mg Oral Q6H PRN Segars, Christiane Ha, MD       pantoprazole (PROTONIX) EC tablet 40 mg  40 mg Oral Daily Dolly Rias, MD   40 mg at 02/15/23 0936   polyethylene glycol (MIRALAX / GLYCOLAX) packet 17 g  17 g Oral Daily PRN Dolly Rias, MD       sodium chloride flush (NS) 0.9 % injection 3 mL  3 mL Intravenous Q12H Segars, Christiane Ha, MD        REVIEW OF SYSTEMS:   Constitutional: Denies fevers, chills or abnormal night sweats Eyes: Denies blurriness of vision, double vision or watery eyes Ears, nose, mouth, throat, and face: Denies mucositis or sore throat Respiratory: Denies cough, dyspnea or wheezes Cardiovascular: Denies palpitation, chest discomfort or lower extremity swelling Gastrointestinal:  Denies nausea, heartburn or change in bowel habits Skin: Denies abnormal skin rashes Lymphatics: Denies new lymphadenopathy or easy bruising Neurological:Denies numbness, tingling or new weaknesses Behavioral/Psych: Mood is stable, no new changes  All other systems were reviewed with the patient and are negative.  PHYSICAL EXAMINATION: ECOG PERFORMANCE STATUS: 1 - Symptomatic but completely ambulatory  Vitals:   02/15/23 1115 02/15/23 1300  BP: (!) 106/51 126/62  Pulse: 69 68  Resp: 20 19  Temp:    SpO2: 91% 92%   Filed Weights   02/14/23 1150  Weight: 172 lb (78 kg)    GENERAL:alert, no distress and comfortable SKIN: skin color, texture, turgor are normal, no rashes or significant lesions EYES: normal, conjunctiva are pink and non-injected, sclera clear OROPHARYNX:no exudate, no erythema and lips, buccal mucosa, and tongue normal  NECK: supple, thyroid normal size, non-tender, without nodularity LYMPH:  no  palpable lymphadenopathy in the cervical, axillary or inguinal LUNGS: clear to auscultation and percussion with normal breathing effort HEART: regular rate & rhythm and no murmurs and no lower extremity edema. Her lower extremity felt warm ABDOMEN:abdomen soft, non-tender and normal bowel sounds Musculoskeletal:no cyanosis of digits and no clubbing  PSYCH: alert & oriented x 3 with fluent speech NEURO: no focal motor/sensory deficits  LABORATORY DATA:  I have reviewed the data as listed Lab Results  Component Value Date   WBC 60.6 (HH) 02/15/2023  HGB 9.9 (L) 02/15/2023   HCT 31.2 (L) 02/15/2023   MCV 97.2 02/15/2023   PLT 57 (L) 02/15/2023     RADIOGRAPHIC STUDIES: I have personally reviewed the radiological images as listed and agreed with the findings in the report. CT Angio Chest PE W and/or Wo Contrast Result Date: 02/14/2023 CLINICAL DATA:  Right leg pain for 1 week that has gotten progressively worse. Lasix not decreasing swelling. PE suspected. EXAM: CT ANGIOGRAPHY CHEST WITH CONTRAST CT ANGIOGRAPHY OF ABDOMINAL AORTA WITH ILIOFEMORAL RUNOFF TECHNIQUE: Multidetector CT imaging of the abdomen, pelvis and lower extremities was performed using the standard protocol during bolus administration of intravenous contrast. Multiplanar CT image reconstructions and MIPs were obtained to evaluate the vascular anatomy. Multidetector CT imaging of the chest was performed using the standard protocol during bolus administration of intravenous contrast. Multiplanar CT image reconstructions and MIPs were obtained to evaluate the vascular anatomy. RADIATION DOSE REDUCTION: This exam was performed according to the departmental dose-optimization program which includes automated exposure control, adjustment of the mA and/or kV according to patient size and/or use of iterative reconstruction technique. CONTRAST:  OMNIPAQUE IOHEXOL 350 MG/ML SOLN COMPARISON:  Same day chest radiograph; CT abdomen and  pelvis 01/13/2023 and CT chest 12/30/2022 FINDINGS: Cardiovascular: No pericardial effusion. No pulmonary embolism. Normal caliber aorta. Aortic atherosclerosis. Mediastinum/Nodes: Trachea and esophagus are unremarkable. New mediastinal and right hilar lymphadenopathy. For example 1.0 cm pretracheal node on series 1/image 33 and 1.0 cm right hilar node on series 1/image 41. Lungs/Pleura: Emphysema. Diffuse interlobular septal thickening. There are new bilateral patchy ground-glass opacities in the dependent lungs bilaterally. No pleural effusion or pneumothorax. Musculoskeletal: No acute fracture. Review of the MIP images confirms the above findings. VASCULAR Aorta: No aneurysm or dissection. Scattered mixed density atherosclerotic plaque without hemodynamically significant stenosis. Celiac: Severe narrowing at the origin of the celiac axis. SMA: Patent. Renals: Patent left renal artery. Moderate narrowing at the origin of the right renal artery. IMA: Patent. RIGHT Lower Extremity Inflow: Scattered atherosclerotic plaque without hemodynamically significant narrowing. No aneurysm or dissection. Outflow: Occlusion of the distal superficial femoral artery with reconstitution of the popliteal artery. Runoff: Three-vessel runoff of the right lower extremity. The distal anterior tibial and peroneal arteries are atretic. Patent dorsalis pedis. LEFT Lower Extremity Inflow: Scattered atherosclerotic plaque without hemodynamically significant narrowing. No aneurysm or dissection. Outflow: Patent.  No aneurysm or dissection. Runoff: Patent three vessel runoff to the ankle. Patent dorsalis pedis. Veins: No obvious venous abnormality within the limitations of this arterial phase study. Review of the MIP images confirms the above findings. NON-VASCULAR Hepatobiliary: No acute abnormality. Pancreas: Unremarkable. Spleen: Unremarkable. Adrenals/Urinary Tract: Normal adrenal glands. Geographic hypoattenuation in the anterior left  kidney is unchanged from 01/13/2023 and compatible with infarct. No urinary calculi or hydronephrosis. Unremarkable bladder. Stomach/Bowel: Normal caliber large and small bowel. Colonic diverticulosis without diverticulitis. Appendix and stomach are within normal limits. Lymphatic: No lymphadenopathy. Reproductive: Hysterectomy. Other: No free intraperitoneal fluid or air. Musculoskeletal: No acute fracture. IMPRESSION: 1. Occlusion of the distal right superficial femoral artery with reconstitution of the popliteal artery. 2. No evidence of pulmonary embolism. 3. New bilateral patchy ground-glass opacities in the dependent lungs bilaterally, likely infectious/inflammatory. 4. New mediastinal and right hilar lymphadenopathy, favored reactive. 5. Unchanged left renal infarct. Aortic Atherosclerosis (ICD10-I70.0) and Emphysema (ICD10-J43.9). Critical Value/emergent results were called by telephone at the time of interpretation on 02/14/2023 at 6:50 pm to provider Dr. Wyn Forster, who verbally acknowledged these results. Electronically Signed   By:  Minerva Fester M.D.   On: 02/14/2023 18:51   CT Angio Aortobifemoral W and/or Wo Contrast Result Date: 02/14/2023 CLINICAL DATA:  Right leg pain for 1 week that has gotten progressively worse. Lasix not decreasing swelling. PE suspected. EXAM: CT ANGIOGRAPHY CHEST WITH CONTRAST CT ANGIOGRAPHY OF ABDOMINAL AORTA WITH ILIOFEMORAL RUNOFF TECHNIQUE: Multidetector CT imaging of the abdomen, pelvis and lower extremities was performed using the standard protocol during bolus administration of intravenous contrast. Multiplanar CT image reconstructions and MIPs were obtained to evaluate the vascular anatomy. Multidetector CT imaging of the chest was performed using the standard protocol during bolus administration of intravenous contrast. Multiplanar CT image reconstructions and MIPs were obtained to evaluate the vascular anatomy. RADIATION DOSE REDUCTION: This exam was performed  according to the departmental dose-optimization program which includes automated exposure control, adjustment of the mA and/or kV according to patient size and/or use of iterative reconstruction technique. CONTRAST:  OMNIPAQUE IOHEXOL 350 MG/ML SOLN COMPARISON:  Same day chest radiograph; CT abdomen and pelvis 01/13/2023 and CT chest 12/30/2022 FINDINGS: Cardiovascular: No pericardial effusion. No pulmonary embolism. Normal caliber aorta. Aortic atherosclerosis. Mediastinum/Nodes: Trachea and esophagus are unremarkable. New mediastinal and right hilar lymphadenopathy. For example 1.0 cm pretracheal node on series 1/image 33 and 1.0 cm right hilar node on series 1/image 41. Lungs/Pleura: Emphysema. Diffuse interlobular septal thickening. There are new bilateral patchy ground-glass opacities in the dependent lungs bilaterally. No pleural effusion or pneumothorax. Musculoskeletal: No acute fracture. Review of the MIP images confirms the above findings. VASCULAR Aorta: No aneurysm or dissection. Scattered mixed density atherosclerotic plaque without hemodynamically significant stenosis. Celiac: Severe narrowing at the origin of the celiac axis. SMA: Patent. Renals: Patent left renal artery. Moderate narrowing at the origin of the right renal artery. IMA: Patent. RIGHT Lower Extremity Inflow: Scattered atherosclerotic plaque without hemodynamically significant narrowing. No aneurysm or dissection. Outflow: Occlusion of the distal superficial femoral artery with reconstitution of the popliteal artery. Runoff: Three-vessel runoff of the right lower extremity. The distal anterior tibial and peroneal arteries are atretic. Patent dorsalis pedis. LEFT Lower Extremity Inflow: Scattered atherosclerotic plaque without hemodynamically significant narrowing. No aneurysm or dissection. Outflow: Patent.  No aneurysm or dissection. Runoff: Patent three vessel runoff to the ankle. Patent dorsalis pedis. Veins: No obvious venous  abnormality within the limitations of this arterial phase study. Review of the MIP images confirms the above findings. NON-VASCULAR Hepatobiliary: No acute abnormality. Pancreas: Unremarkable. Spleen: Unremarkable. Adrenals/Urinary Tract: Normal adrenal glands. Geographic hypoattenuation in the anterior left kidney is unchanged from 01/13/2023 and compatible with infarct. No urinary calculi or hydronephrosis. Unremarkable bladder. Stomach/Bowel: Normal caliber large and small bowel. Colonic diverticulosis without diverticulitis. Appendix and stomach are within normal limits. Lymphatic: No lymphadenopathy. Reproductive: Hysterectomy. Other: No free intraperitoneal fluid or air. Musculoskeletal: No acute fracture. IMPRESSION: 1. Occlusion of the distal right superficial femoral artery with reconstitution of the popliteal artery. 2. No evidence of pulmonary embolism. 3. New bilateral patchy ground-glass opacities in the dependent lungs bilaterally, likely infectious/inflammatory. 4. New mediastinal and right hilar lymphadenopathy, favored reactive. 5. Unchanged left renal infarct. Aortic Atherosclerosis (ICD10-I70.0) and Emphysema (ICD10-J43.9). Critical Value/emergent results were called by telephone at the time of interpretation on 02/14/2023 at 6:50 pm to provider Dr. Wyn Forster, who verbally acknowledged these results. Electronically Signed   By: Minerva Fester M.D.   On: 02/14/2023 18:51   DG Chest Portable 1 View Result Date: 02/14/2023 CLINICAL DATA:  Right leg pain and swelling. EXAM: PORTABLE CHEST 1 VIEW COMPARISON:  12/30/2022. FINDINGS: Bilateral lungs appear hyperlucent with coarse bronchovascular markings, in keeping with COPD. There are atelectatic changes at the left lung base. Bilateral lungs otherwise appear clear. No overt pulmonary edema. No dense consolidation or lung collapse. Bilateral costophrenic angles are clear. Stable cardio-mediastinal silhouette. No acute osseous abnormalities. The soft  tissues are within normal limits. IMPRESSION: *No active disease.  COPD. Electronically Signed   By: Jules Schick M.D.   On: 02/14/2023 15:53   US Venous Img Lower Unilateral Right Result Date: 02/14/2023 CLINICAL DATA:  Right lower extremity pain and edema. EXAM: RIGHT LOWER EXTREMITY VENOUS DOPPLER ULTRASOUND TECHNIQUE: Gray-scale sonography with graded compression, as well as color Doppler and duplex ultrasound were performed to evaluate the lower extremity deep venous systems from the level of the common femoral vein and including the common femoral, femoral, profunda femoral, popliteal and calf veins including the posterior tibial, peroneal and gastrocnemius veins when visible. The superficial great saphenous vein was also interrogated. Spectral Doppler was utilized to evaluate flow at rest and with distal augmentation maneuvers in the common femoral, femoral and popliteal veins. COMPARISON:  None Available. FINDINGS: Contralateral Common Femoral Vein: Respiratory phasicity is normal and symmetric with the symptomatic side. No evidence of thrombus. Normal compressibility. Common Femoral Vein: No evidence of thrombus. Normal compressibility, respiratory phasicity and response to augmentation. Saphenofemoral Junction: No evidence of thrombus. Normal compressibility and flow on color Doppler imaging. Profunda Femoral Vein: No evidence of thrombus. Normal compressibility and flow on color Doppler imaging. Femoral Vein: No evidence of thrombus. Normal compressibility, respiratory phasicity and response to augmentation. Popliteal Vein: No evidence of thrombus. Normal compressibility, respiratory phasicity and response to augmentation. Calf Veins: No evidence of thrombus. Normal compressibility and flow on color Doppler imaging. Superficial Great Saphenous Vein: No evidence of thrombus. Normal compressibility. Venous Reflux:  None. Other Findings: No evidence of superficial thrombophlebitis or abnormal fluid  collection. IMPRESSION: No evidence of right lower extremity deep venous thrombosis. Electronically Signed   By: Irish Lack M.D.   On: 02/14/2023 15:40

## 2023-02-15 NOTE — H&P (Signed)
History and Physical    Lisa [name] NWG:[id number] DOB: 04/08/1945 DOA: 02/14/2023  PCP: Kirstie Peri, MD   Patient coming from: Home   Chief Complaint:  Chief Complaint  Patient presents with   Leg Pain    HPI:  Lisa [name] is a 77 y.o. 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  Reports that she developed sudden onset of right foot pain esp involving middle third digits 1.5 weeks ago with associated cyanotic changes in her distal toes and numbness in the toes as well.  She reports some issues with ambulation but no frank weakness in the right leg.  She has been adherent with Eliquis and has not missed any doses.  Did have a nosebleed few days ago which is stopped.  No family history of clotting disorder.  History of 1 miscarriage in the past.    Review of Systems:  ROS complete and negative except as marked above   Allergies  Allergen Reactions   Brilinta [Ticagrelor] Shortness Of Breath   Penicillins Anaphylaxis    immediate rash, facial/tongue/throat swelling, SOB or lightheadedness with hypotension severe rash involving mucus membranes or skin necrosis   Ranexa [Ranolazine] Shortness Of Breath   Sulfa Antibiotics Anaphylaxis and Rash    Blistering rash on hands and feet   Crestor [Rosuvastatin] Other (See Comments)    Myalgias    Prior to Admission medications   Medication Sig Start Date End Date Taking? Authorizing Provider  acetaminophen (TYLENOL) 500 MG tablet Take 1,000 mg by mouth every 6 (six) hours as needed for moderate pain.   Yes [provider]  apixaban (ELIQUIS) 5 MG TABS tablet Take 1 tablet (5 mg total) by mouth 2 (two) times daily. 02/08/23  Yes Pricilla Riffle, MD  Cholecalciferol (VITAMIN D3) 125 MCG (5000 UT)  CAPS Take 5,000 Units by mouth daily.   Yes [provider]  Coenzyme Q10 (COQ10) 100 MG CAPS Take 100 mg by mouth daily.   Yes [provider]  Cyanocobalamin (VITAMIN B-12 PO) Take 1 tablet by mouth daily.   Yes [provider]  diazepam (VALIUM) 2 MG tablet Take 2 mg by mouth daily as needed for anxiety. 03/16/20  Yes [provider]  docusate sodium (COLACE) 100 MG capsule Take 100 mg by mouth daily.   Yes [provider]  EPINEPHrine 0.3 mg/0.3 mL IJ SOAJ injection Inject 0.3 mg into the muscle as needed for anaphylaxis. 08/19/22  Yes [provider]  esomeprazole (NEXIUM) 20 MG capsule Take 20 mg by mouth daily at 12 noon.   Yes [provider]  fluticasone-salmeterol (ADVAIR HFA) 115-21 MCG/ACT inhaler Inhale 2 puffs into the lungs at bedtime.   Yes [provider]  furosemide (LASIX) 20 MG tablet Take 20 mg by mouth daily as needed for fluid or edema. 12/14/22  Yes [provider]  Glucosamine-Chondroitin (MOVE FREE PO) Take 1 tablet by mouth daily.   Yes [provider]  lisinopril (ZESTRIL) 20 MG tablet Take 1 tablet (20 mg total) by mouth daily. 01/06/23  Yes Elgergawy, Leana Roe, MD  metoprolol succinate (TOPROL XL) 25 MG 24 hr tablet Take 0.5 tablets (12.5 mg total) by mouth 2 (two) times daily. Patient taking differently: Take 25 mg by mouth daily. 04/08/19  Yes Dietrich Pates  V, MD  nitroGLYCERIN (NITROSTAT) 0.4 MG SL tablet Place 1 tablet (0.4 mg total) under the tongue every 5 (five) minutes as needed for chest pain. 09/06/16  Yes Duke, Roe Rutherford, PA  potassium chloride (KLOR-CON M) 10 MEQ tablet Take 10 mEq by mouth as needed. 12/14/22  Yes [provider]  Probiotic Product (PROBIOTIC PO) Take 1 capsule by mouth daily.   Yes [provider]    Past Medical History:  Diagnosis Date   Asthma    COPD (chronic obstructive pulmonary disease) (HCC)    Coronary artery disease     Cough    " ALL MY LIFE "   Dyspnea    GERD (gastroesophageal reflux disease)    History of blood clots    patient had a blood clot in her kidney 01/03/2023.  pt is on Eliquis   Hypertension    NSTEMI (non-ST elevated myocardial infarction) (HCC) 09/01/2016   DES to Cx/OM bifurcation   Unstable angina (HCC) 08/2016    Past Surgical History:  Procedure Laterality Date   ABDOMINAL HYSTERECTOMY     COLONOSCOPY WITH PROPOFOL N/A 01/16/2023   Procedure: COLONOSCOPY WITH PROPOFOL;  Surgeon: Lanelle Bal, DO;  [facility];  Service: Endoscopy;  Laterality: N/A;  845am, asa 3   CORONARY STENT INTERVENTION  09/05/2016   Successful complex PCI of the circumflex/OM bifurcation using a Synergy DES   CORONARY STENT INTERVENTION N/A 09/05/2016   Procedure: Coronary Stent Intervention;  Surgeon: Tonny Bollman, MD;  Location: [facility] INVASIVE CV LAB;  Service: Cardiovascular;  Laterality: N/A;   LEFT HEART CATH AND CORONARY ANGIOGRAPHY N/A 09/02/2016   Procedure: Left Heart Cath and Coronary Angiography;  Surgeon: Corky Crafts, MD;  Location: [facility] INVASIVE CV LAB;  Service: Cardiovascular;  Laterality: N/A;   POLYPECTOMY  01/16/2023   Procedure: POLYPECTOMY;  Surgeon: Lanelle Bal, DO;  Location: [facility];  Service: Endoscopy;;     reports that she has been smoking cigarettes. She has a 50 pack-year smoking history. She has never used smokeless tobacco. She reports that she does not currently use alcohol. She reports that she does not use drugs.  Family History  Problem Relation Age of Onset   Asthma Mother    Heart attack Father 27   Heart attack Son 11   Prostate cancer Brother      Physical Exam: Vitals:   02/14/23 1600 02/14/23 1615 02/14/23 1630 02/14/23 2014  BP: (!) 145/75 120/87 (!) 124/55   Pulse: 75 72 70   Resp:  19    Temp:      TempSrc:      SpO2: 92% 93% 93% 95%  Weight:      Height:        Gen: Awake, alert, NAD   CV: Regular, normal S1, S2,  no murmurs, PT is 1+ on the right, DP nonpalpable on the right, left DP/PT 1+.  Resp: Normal WOB, CTAB  Abd: Flat, normoactive, nontender MSK: Symmetric, no edema  Skin:  Cyanotic changes distally in the toes on the right.  Neuro: Alert and interactive, motor is 5 out of 5 and symmetric throughout the right lower extremity.  Hypoesthesia of distal plantar aspect of the foot on right.   Psych: euthymic, appropriate    Data review:   Labs reviewed, notable for:   K2.8, Creatinine 1 (baseline 0.6) WBC 55 (up from 7), hemoglobin 11 (down from 13), platelet 57 (down from 338) INR 1.5  Micro:  Results for orders placed or performed during the hospital encounter of 02/14/23  Culture, blood (Routine X 2) w Reflex to ID Panel     Status: None (Preliminary result)   Collection Time: 02/14/23 11:54 PM   Specimen: BLOOD  Result Value Ref Range Status   Specimen Description BLOOD RIGHT ANTECUBITAL  Final   Special Requests   Final    BOTTLES DRAWN AEROBIC AND ANAEROBIC Blood Culture adequate volume Performed at [facility], [address], [place], [place] [address]    Culture PENDING  Incomplete   Report Status PENDING  Incomplete  Culture, blood (Routine X 2) w Reflex to ID Panel     Status: None (Preliminary result)   Collection Time: 02/14/23 11:54 PM   Specimen: BLOOD  Result Value Ref Range Status   Specimen Description BLOOD BLOOD RIGHT HAND  Final   Special Requests   Final    BOTTLES DRAWN AEROBIC AND ANAEROBIC Blood Culture adequate volume Performed at [facility], [address] [place], [place] [address]    Culture PENDING  Incomplete   Report Status PENDING  Incomplete    Imaging reviewed:  CT Angio Chest PE W and/or Wo Contrast Result Date: 02/14/2023 CLINICAL DATA:  Right leg pain for 1 week that has gotten progressively worse. Lasix not decreasing swelling. PE suspected. EXAM: CT ANGIOGRAPHY CHEST WITH CONTRAST CT ANGIOGRAPHY OF ABDOMINAL AORTA WITH ILIOFEMORAL  RUNOFF TECHNIQUE: Multidetector CT imaging of the abdomen, pelvis and lower extremities was performed using the standard protocol during bolus administration of intravenous contrast. Multiplanar CT image reconstructions and MIPs were obtained to evaluate the vascular anatomy. Multidetector CT imaging of the chest was performed using the standard protocol during bolus administration of intravenous contrast. Multiplanar CT image reconstructions and MIPs were obtained to evaluate the vascular anatomy. RADIATION DOSE REDUCTION: This exam was performed according to the departmental dose-optimization program which includes automated exposure control, adjustment of the mA and/or kV according to patient size and/or use of iterative reconstruction technique. CONTRAST:  OMNIPAQUE IOHEXOL 350 MG/ML SOLN COMPARISON:  Same day chest radiograph; CT abdomen and pelvis 01/13/2023 and CT chest 12/30/2022 FINDINGS: Cardiovascular: No pericardial effusion. No pulmonary embolism. Normal caliber aorta. Aortic atherosclerosis. Mediastinum/Nodes: Trachea and esophagus are unremarkable. New mediastinal and right hilar lymphadenopathy. For example 1.0 cm pretracheal node on series 1/image 33 and 1.0 cm right hilar node on series 1/image 41. Lungs/Pleura: Emphysema. Diffuse interlobular septal thickening. There are new bilateral patchy ground-glass opacities in the dependent lungs bilaterally. No pleural effusion or pneumothorax. Musculoskeletal: No acute fracture. Review of the MIP images confirms the above findings. VASCULAR Aorta: No aneurysm or dissection. Scattered mixed density atherosclerotic plaque without hemodynamically significant stenosis. Celiac: Severe narrowing at the origin of the celiac axis. SMA: Patent. Renals: Patent left renal artery. Moderate narrowing at the origin of the right renal artery. IMA: Patent. RIGHT Lower Extremity Inflow: Scattered atherosclerotic plaque without hemodynamically significant narrowing.  No aneurysm or dissection. Outflow: Occlusion of the distal superficial femoral artery with reconstitution of the popliteal artery. Runoff: Three-vessel runoff of the right lower extremity. The distal anterior tibial and peroneal arteries are atretic. Patent dorsalis pedis. LEFT Lower Extremity Inflow: Scattered atherosclerotic plaque without hemodynamically significant narrowing. No aneurysm or dissection. Outflow: Patent.  No aneurysm or dissection. Runoff: Patent three vessel runoff to the ankle. Patent dorsalis pedis. Veins: No obvious venous abnormality within the limitations of this arterial phase study. Review of the MIP images confirms the above findings. NON-VASCULAR Hepatobiliary: No  acute abnormality. Pancreas: Unremarkable. Spleen: Unremarkable. Adrenals/Urinary Tract: Normal adrenal glands. Geographic hypoattenuation in the anterior left kidney is unchanged from 01/13/2023 and compatible with infarct. No urinary calculi or hydronephrosis. Unremarkable bladder. Stomach/Bowel: Normal caliber large and small bowel. Colonic diverticulosis without diverticulitis. Appendix and stomach are within normal limits. Lymphatic: No lymphadenopathy. Reproductive: Hysterectomy. Other: No free intraperitoneal fluid or air. Musculoskeletal: No acute fracture. IMPRESSION: 1. Occlusion of the distal right superficial femoral artery with reconstitution of the popliteal artery. 2. No evidence of pulmonary embolism. 3. New bilateral patchy ground-glass opacities in the dependent lungs bilaterally, likely infectious/inflammatory. 4. New mediastinal and right hilar lymphadenopathy, favored reactive. 5. Unchanged left renal infarct. Aortic Atherosclerosis (ICD10-I70.0) and Emphysema (ICD10-J43.9). Critical Value/emergent results were called by telephone at the time of interpretation on 02/14/2023 at 6:50 pm to provider Dr. Wyn Forster, who verbally acknowledged these results. Electronically Signed   By: Minerva Fester M.D.   On:  02/14/2023 18:51   CT Angio Aortobifemoral W and/or Wo Contrast Result Date: 02/14/2023 CLINICAL DATA:  Right leg pain for 1 week that has gotten progressively worse. Lasix not decreasing swelling. PE suspected. EXAM: CT ANGIOGRAPHY CHEST WITH CONTRAST CT ANGIOGRAPHY OF ABDOMINAL AORTA WITH ILIOFEMORAL RUNOFF TECHNIQUE: Multidetector CT imaging of the abdomen, pelvis and lower extremities was performed using the standard protocol during bolus administration of intravenous contrast. Multiplanar CT image reconstructions and MIPs were obtained to evaluate the vascular anatomy. Multidetector CT imaging of the chest was performed using the standard protocol during bolus administration of intravenous contrast. Multiplanar CT image reconstructions and MIPs were obtained to evaluate the vascular anatomy. RADIATION DOSE REDUCTION: This exam was performed according to the departmental dose-optimization program which includes automated exposure control, adjustment of the mA and/or kV according to patient size and/or use of iterative reconstruction technique. CONTRAST:  OMNIPAQUE IOHEXOL 350 MG/ML SOLN COMPARISON:  Same day chest radiograph; CT abdomen and pelvis 01/13/2023 and CT chest 12/30/2022 FINDINGS: Cardiovascular: No pericardial effusion. No pulmonary embolism. Normal caliber aorta. Aortic atherosclerosis. Mediastinum/Nodes: Trachea and esophagus are unremarkable. New mediastinal and right hilar lymphadenopathy. For example 1.0 cm pretracheal node on series 1/image 33 and 1.0 cm right hilar node on series 1/image 41. Lungs/Pleura: Emphysema. Diffuse interlobular septal thickening. There are new bilateral patchy ground-glass opacities in the dependent lungs bilaterally. No pleural effusion or pneumothorax. Musculoskeletal: No acute fracture. Review of the MIP images confirms the above findings. VASCULAR Aorta: No aneurysm or dissection. Scattered mixed density atherosclerotic plaque without hemodynamically  significant stenosis. Celiac: Severe narrowing at the origin of the celiac axis. SMA: Patent. Renals: Patent left renal artery. Moderate narrowing at the origin of the right renal artery. IMA: Patent. RIGHT Lower Extremity Inflow: Scattered atherosclerotic plaque without hemodynamically significant narrowing. No aneurysm or dissection. Outflow: Occlusion of the distal superficial femoral artery with reconstitution of the popliteal artery. Runoff: Three-vessel runoff of the right lower extremity. The distal anterior tibial and peroneal arteries are atretic. Patent dorsalis pedis. LEFT Lower Extremity Inflow: Scattered atherosclerotic plaque without hemodynamically significant narrowing. No aneurysm or dissection. Outflow: Patent.  No aneurysm or dissection. Runoff: Patent three vessel runoff to the ankle. Patent dorsalis pedis. Veins: No obvious venous abnormality within the limitations of this arterial phase study. Review of the MIP images confirms the above findings. NON-VASCULAR Hepatobiliary: No acute abnormality. Pancreas: Unremarkable. Spleen: Unremarkable. Adrenals/Urinary Tract: Normal adrenal glands. Geographic hypoattenuation in the anterior left kidney is unchanged from 01/13/2023 and compatible with infarct. No urinary calculi or hydronephrosis. Unremarkable bladder. Stomach/Bowel:  Normal caliber large and small bowel. Colonic diverticulosis without diverticulitis. Appendix and stomach are within normal limits. Lymphatic: No lymphadenopathy. Reproductive: Hysterectomy. Other: No free intraperitoneal fluid or air. Musculoskeletal: No acute fracture. IMPRESSION: 1. Occlusion of the distal right superficial femoral artery with reconstitution of the popliteal artery. 2. No evidence of pulmonary embolism. 3. New bilateral patchy ground-glass opacities in the dependent lungs bilaterally, likely infectious/inflammatory. 4. New mediastinal and right hilar lymphadenopathy, favored reactive. 5. Unchanged left renal  infarct. Aortic Atherosclerosis (ICD10-I70.0) and Emphysema (ICD10-J43.9). Critical Value/emergent results were called by telephone at the time of interpretation on 02/14/2023 at 6:50 pm to provider Dr. Wyn Forster, who verbally acknowledged these results. Electronically Signed   By: Minerva Fester M.D.   On: 02/14/2023 18:51   DG Chest Portable 1 View Result Date: 02/14/2023 CLINICAL DATA:  Right leg pain and swelling. EXAM: PORTABLE CHEST 1 VIEW COMPARISON:  12/30/2022. FINDINGS: Bilateral lungs appear hyperlucent with coarse bronchovascular markings, in keeping with COPD. There are atelectatic changes at the left lung base. Bilateral lungs otherwise appear clear. No overt pulmonary edema. No dense consolidation or lung collapse. Bilateral costophrenic angles are clear. Stable cardio-mediastinal silhouette. No acute osseous abnormalities. The soft tissues are within normal limits. IMPRESSION: *No active disease.  COPD. Electronically Signed   By: Jules Schick M.D.   On: 02/14/2023 15:53   US Venous Img Lower Unilateral Right Result Date: 02/14/2023 CLINICAL DATA:  Right lower extremity pain and edema. EXAM: RIGHT LOWER EXTREMITY VENOUS DOPPLER ULTRASOUND TECHNIQUE: Gray-scale sonography with graded compression, as well as color Doppler and duplex ultrasound were performed to evaluate the lower extremity deep venous systems from the level of the common femoral vein and including the common femoral, femoral, profunda femoral, popliteal and calf veins including the posterior tibial, peroneal and gastrocnemius veins when visible. The superficial great saphenous vein was also interrogated. Spectral Doppler was utilized to evaluate flow at rest and with distal augmentation maneuvers in the common femoral, femoral and popliteal veins. COMPARISON:  None Available. FINDINGS: Contralateral Common Femoral Vein: Respiratory phasicity is normal and symmetric with the symptomatic side. No evidence of thrombus. Normal  compressibility. Common Femoral Vein: No evidence of thrombus. Normal compressibility, respiratory phasicity and response to augmentation. Saphenofemoral Junction: No evidence of thrombus. Normal compressibility and flow on color Doppler imaging. Profunda Femoral Vein: No evidence of thrombus. Normal compressibility and flow on color Doppler imaging. Femoral Vein: No evidence of thrombus. Normal compressibility, respiratory phasicity and response to augmentation. Popliteal Vein: No evidence of thrombus. Normal compressibility, respiratory phasicity and response to augmentation. Calf Veins: No evidence of thrombus. Normal compressibility and flow on color Doppler imaging. Superficial Great Saphenous Vein: No evidence of thrombus. Normal compressibility. Venous Reflux:  None. Other Findings: No evidence of superficial thrombophlebitis or abnormal fluid collection. IMPRESSION: No evidence of right lower extremity deep venous thrombosis. Electronically Signed   By: Irish Lack M.D.   On: 02/14/2023 15:40     ED Course:  Case was discussed with vascular surgery Dr. Randie Heinz who recommends for initiation of heparin drip and transfer to [facility] for angiography as early as tomorrow.  Started on heparin drip in the ED.  Treated with potassium, Tylenol.   Assessment/Plan:  77 y.o. female with hx recent hospitalization in 11/'24 treated for left renal infarct and left renal artery thromboembolism, started on anticoagulation with Eliquis, CAD with history PCI, PAD, HFpEF, COPD, current smoker half pack a day, who presented with painful right foot with associated cyanosis and  hypoesthesia x 1.5 weeks.  Found to have distal right SFA occlusion with reconstitution distally, and acute arterial ischemia associated with this.  Otherwise notable hyperleukocytosis with new onset of accompanying thrombocytopenia.   Right SFA occlusion, suspected thromboembolism Acute ischemic limb RLE Recurrent arterial  thromboembolism Hx 1.5 wk right foot pain with associated cyanosis and hypoesthesia.  CTA with a distal right SFA occlusion with distal reconstitution.  History of recent left renal artery thromboembolism and renal infarct.  Concern for underlying clotting disorder considering recurrent arterial thromboembolism.  -EDP consulted vascular surgery Dr. Randie Heinz recommending for transfer to [facility] for possible angiography tomorrow, heparin drip - N.p.o. after midnight - Pharmacy to dose heparin drip - Close monitoring of platelets given thrombocytopenia and history of prior bleeding events on antiplatelets/anticoagulation, hold if platelet less than 50 - Check lupus anticoagulants, additional evaluation for underlying clotting disorder per hematology  Hyperleukocytosis Thrombocytopenia Acute onset of above since 11/' 24.  WBC 55 with monocyte predominance, no blasts peripherally, left shift with greater than 5% promyelocyte, myelocyte, metamyelocyte.  Initial differential also seen 0.6 basophil, and on repeat 1.3 eosinophil.  Immature granulocytes elevated at 6.9.  Etiology of hyperleukocytosis unknown, no localizing infectious symptoms.  Does have history of recent left renal infarct with no complications seen on imaging.  Groundglass opacity in lungs with minimal URI symptoms.  Appears out of proportion to possible infectious or reactive causes.  With history recurrent thromboembolism concern for possible underlying leukemia - Discussed case with hematology Dr. Bertis Ruddy, she will consult at [facility] in the morning - Pathologist smear review pending - Check TLS labs, B12 - Check LFT, urinalysis, urine culture, RVP, blood cultures.  Hold on antibiotics for now.  Hypokalemia Hypophosphatemia - Repleted  Incidental findings on imaging New mediastinal and right hilar lymphadenopathy. For example 1.0 cm pretracheal node on series 1/image 33 and 1.0 cm right hilar node on series 1/image 41. Diffuse  interlobular septal thickening of lung  Severe narrowing at the origin of the celiac axis. Moderate narrowing at the origin of the right renal artery.   Chronic medical problems: left renal infarct and left renal artery thromboembolism: Holding home Eliquis while on heparin drip per above CAD with history PCI, PAD: Prior to admission on DOAC monotherapy.  Statin intolerant HFpEF: Hold home Lasix for now COPD, current smoker half pack a day: Albuterol as needed, declined NRT for smoking cessation Hypertension: Continue home metoprolol GERD: Sub home PPI Mood disorder: Continue home diazepam prn  Body mass index is 26.94 kg/m.    DVT prophylaxis:  IV heparin gtts Code Status:  Full Code Diet:  Diet Orders (From admission, onward)     Start     Ordered   02/14/23 2211  Diet clear liquid Room service appropriate? Yes; Fluid consistency: Thin  Diet effective now       Question Answer Comment  Room service appropriate? Yes   Fluid consistency: Thin      02/14/23 2212           Family Communication: Yes discussed with her daughter at the bedside Consults: Vascular surgery, hematology Admission status:   Inpatient, Step Down Unit  Severity of Illness: The appropriate patient status for this patient is INPATIENT. Inpatient status is judged to be reasonable and necessary in order to provide the required intensity of service to ensure the patient's safety. The patient's presenting symptoms, physical exam findings, and initial radiographic and laboratory data in the context of their chronic comorbidities is felt to  place them at high risk for further clinical deterioration. Furthermore, it is not anticipated that the patient will be medically stable for discharge from the hospital within 2 midnights of admission.   * I certify that at the point of admission it is my clinical judgment that the patient will require inpatient hospital care spanning beyond 2 midnights from the point of  admission due to high intensity of service, high risk for further deterioration and high frequency of surveillance required.*   Dolly Rias, MD [facility]  How to contact the [facility] Attending or Consulting provider 7A - 7P or covering provider during after hours 7P -7A, for this patient.  Check the care team in [facility] and look for a) attending/consulting [facility] provider listed and b) the [facility] team listed Log into [email or web address] and use Home's universal password to access. If you do not have the password, please contact the hospital operator. Locate the [facility] provider you are looking for under [facility] and page to a number that you can be directly reached. If you still have difficulty reaching the provider, please page the [place] [facility] Surgery (Director on Call) for the Hospitalists listed on amion for assistance.  02/15/2023, 1:42 AM

## 2023-02-15 NOTE — Progress Notes (Signed)
Pt arrived to unit from [facility] VSS, A/O x 4,  CCMD called ,CHG given, pt oriented to unit,Will continue to monitor.   Karna Christmas Claudean Leavelle, RN    02/15/23 1514  Vitals  Temp 99.1 F (37.3 C)  Temp Source Oral  BP (!) 126/58  MAP (mmHg) 78  BP Location Right Arm  BP Method Automatic  Patient Position (if appropriate) Lying  Pulse Rate 75  Pulse Rate Source Monitor  ECG Heart Rate 74  Resp 20  Level of Consciousness  Level of Consciousness Alert  Oxygen Therapy  SpO2 95 %  O2 Device Nasal Cannula  O2 Flow Rate (L/min) 2 L/min  Pain Assessment  Pain Scale 0-10  Pain Score 3  MEWS Score  MEWS Temp 0  MEWS Systolic 0  MEWS Pulse 0  MEWS RR 0  MEWS LOC 0  MEWS Score 0  MEWS Score Color [place] Si

## 2023-02-15 NOTE — Progress Notes (Signed)
02/15/2023 1:25 PM  Updated by ED secretary and ED provider Dr Eloise Harman; They sent [facility] her facesheet and pushed out the images.  [facility] may have a vascular bed for her.  Still waiting on a bed assignment.  She may have a bed assignment at [facility] but no truck has been dispensed to pick her up yet.  Pt may have to go to [facility] to see vascular and then when bed opens up transfer to [facility] for treatment of AML.  Updated receiving physician Dr. Corrie Mckusick at [facility] who verbalized understanding.    Maryln Manuel, MD How to contact the [facility] Attending or Consulting provider 7A - 7P or covering provider during after hours 7P -7A, for this patient?  Check the care team in [facility] and look for a) attending/consulting TRH provider listed and b) the [facility] team listed Log into [email or web address] and use [place]'s universal password to access. If you do not have the password, please contact the hospital operator. Locate the [facility] provider you are looking for under [facility] and page to a number that you can be directly reached. If you still have difficulty reaching the provider, please page the [facility] (Director on Call) for the Hospitalists listed on amion for assistance.

## 2023-02-15 NOTE — ED Provider Notes (Signed)
Contacted by Dr Laural Benes to assist with dispo for the patient. In brief 77 yo F with newly diagnosed AML yesterday who has acute limb ischemia of the RLE who was admitted to hospitalist and has now had a prolonged boarding time. She is awaiting formal vascular surgery consult and angiogram   Called [facility]. No beds available currently on the inpatient side. They will call back shortly.   [facility] called the secretary back and it appears that they may have a bed available for vascular surgery patient.  Dr. Laural Benes updated and he will contact them.   Rondel Baton, MD 02/15/23 [phone]

## 2023-02-15 NOTE — ED Notes (Addendum)
Pt picked up by [facility] for transfer to [facility]

## 2023-02-15 NOTE — Progress Notes (Signed)
Patient seen and examined.  She arrived from [facility].  On my exam she denied any complaints.  Her daughter who is also one of our RN was at the bedside.  I communicated then with vascular surgery and oncology.  Further management plan has been formulated.  Ischemic leg with improving symptoms, she was found to have Doppler pulses and improvement in the color.  Seen by vascular surgery.  They recommended to continue heparin especially given high risk of complications with thrombocytopenia.  Vascular surgery will continue to follow.  Profound leukocytosis, thrombosis: Suspected acute leukemia. Followed by oncology. Multiple investigations ordered, flow cytometry and peripheral blood smear. If suspicious for acute myeloid leukemia, she will need to be transferred to [facility] or other tertiary leukemia center.  Currently she will stay at [facility] tonight.   Patient and family updated.   Continuation of care.  No charge visit.

## 2023-02-15 NOTE — Progress Notes (Addendum)
PROGRESS NOTE   Lisa [name]  ZOX:[id number] DOB: 23-Feb-1946 DOA: 02/14/2023 PCP: Kirstie Peri, MD   Chief Complaint  Patient presents with   Leg Pain   Level of care: Progressive  Brief Admission History:  77 y.o. 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  Reports that she developed sudden onset of right foot pain esp involving middle third digits 1.5 weeks ago with associated cyanotic changes in her distal toes and numbness in the toes as well.  She reports some issues with ambulation but no frank weakness in the right leg.  She has been adherent with Eliquis and has not missed any doses.  Did have a nosebleed few days ago which is stopped.  No family history of clotting disorder.  History of 1 miscarriage in the past.      Assessment and Plan:  Hyperleukocytosis  Question of AML  - discussed with Dr. Bertis Ruddy and peripheral smear and labs could be suggestive of AML - pt has been  holding for bed at [facility] for more than 12 hours; Dr Bertis Ruddy recommended consultation with Dr. Ellin Saba due to prolonged wait at [facility]. I reached out to Dr. Ellin Saba.  He ordered flow cytometry study.   - Dr. Bertis Ruddy recommended patient go to [facility] due to inability to treat AML at [facility].  I reached out to Dr. Jarold Motto for ED to ED transfer to [facility].  He did but was told no beds available at [facility].  He said that he may have to do an ED to ED transfer to [facility] so that she can be seen by vascular surgery while waiting on bed at a tertiary care center.  I also reached out to [facility] and updated regarding the urgency of need for transfer to higher level of care.  -notified by [facility] that patient was next to transfer to [facility]; updated Dr. Bertis Ruddy; pt likely will need to be treated by vascular surgery at  [facility] and then transferred to tertiary care center for AML treatment   Right SFA occlusion  Acute ischemic limb RLE Recurrent arterial thromboembolism - Dr. Randie Heinz with vascular surgery consulted and recommended transfer to [facility] for angiogram  - Pt was started on IV heparin infusion, apixaban discontinued  DVT prophylaxis: IV heparin infusion  Code Status: Full  Family Communication: bedside update 12/18 Disposition: waiting on transfer to higher level of care   Consultants:  Vascular Surgery Dr. Randie Heinz Oncology Dr. Bertis Ruddy and Dr. Ellin Saba   Procedures:   Antimicrobials:    Subjective: Pt reports that pain is better in right foot since being on IV heparin infusion.   Objective: Vitals:   02/15/23 0937 02/15/23 0945 02/15/23 1000 02/15/23 1015  BP: (!) 101/43 (!) 100/47 (!) 91/44 (!) 98/48  Pulse: 70 71 68 61  Resp:  (!) 23 (!) 22 (!) 22  Temp:      TempSrc:      SpO2:  (!) 87% 91% 91%  Weight:      Height:        Intake/Output Summary (Last 24 hours) at 02/15/2023 1043 Last data filed at 02/15/2023 0803 Gross per 24 hour  Intake 1000 ml  Output --  Net 1000 ml   Filed Weights   02/14/23 1150  Weight: 78 kg   Examination:  General exam: Appears  calm and comfortable  Respiratory system: Clear to auscultation. Respiratory effort normal. Cardiovascular system: normal S1 & S2 heard. No JVD, murmurs, rubs, gallops or clicks. No pedal edema. Gastrointestinal system: Abdomen is nondistended, soft and nontender. No organomegaly or masses felt. Normal bowel sounds heard. Central nervous system: Alert and oriented. No focal neurological deficits. Extremities: cool and mottled appearing right lower extremity and foot. Not able to palpate pedal pulses on right foot.  Skin: No rashes, lesions or ulcers. Psychiatry: Judgement and insight appear normal. Mood & affect appropriate.   Data Reviewed: I have personally reviewed following labs and imaging studies  CBC: Recent Labs   Lab 02/14/23 1413 02/14/23 2041 02/15/23 0605  WBC 55.9* 63.0* 60.6*  NEUTROABS 6.1 10.8* 7.9*  HGB 11.7* 11.0* 9.9*  HCT 37.1 34.6* 31.2*  MCV 98.4 96.9 97.2  PLT 57* 71* 57*    Basic Metabolic Panel: Recent Labs  Lab 02/14/23 1413 02/14/23 2041 02/15/23 0605  NA 141  --  138  K 2.8*  --  3.9  CL 105  --  105  CO2 27  --  24  GLUCOSE 113*  --  116*  BUN 7*  --  6*  CREATININE 1.05*  --  0.95  CALCIUM 8.7*  --  7.9*  MG  --  1.7 1.6*  PHOS  --  2.0* 2.7    CBG: No results for input(s): "GLUCAP" in the last 168 hours.  Recent Results (from the past 240 hours)  Culture, blood (Routine X 2) w Reflex to ID Panel     Status: None (Preliminary result)   Collection Time: 02/14/23 11:54 PM   Specimen: BLOOD  Result Value Ref Range Status   Specimen Description BLOOD RIGHT ANTECUBITAL  Final   Special Requests   Final    BOTTLES DRAWN AEROBIC AND ANAEROBIC Blood Culture adequate volume   Culture   Final    NO GROWTH < 12 HOURS Performed at [facility], [address], [place], [place] [address]    Report Status PENDING  Incomplete  Culture, blood (Routine X 2) w Reflex to ID Panel     Status: None (Preliminary result)   Collection Time: 02/14/23 11:54 PM   Specimen: BLOOD  Result Value Ref Range Status   Specimen Description BLOOD BLOOD RIGHT HAND  Final   Special Requests   Final    BOTTLES DRAWN AEROBIC AND ANAEROBIC Blood Culture adequate volume   Culture   Final    NO GROWTH < 12 HOURS Performed at [facility], [address] [place], [place] [address]    Report Status PENDING  Incomplete     Radiology Studies: CT Angio Chest PE W and/or Wo Contrast Result Date: 02/14/2023 CLINICAL DATA:  Right leg pain for 1 week that has gotten progressively worse. Lasix not decreasing swelling. PE suspected. EXAM: CT ANGIOGRAPHY CHEST WITH CONTRAST CT ANGIOGRAPHY OF ABDOMINAL AORTA WITH ILIOFEMORAL RUNOFF TECHNIQUE: Multidetector CT imaging of the abdomen, pelvis and  lower extremities was performed using the standard protocol during bolus administration of intravenous contrast. Multiplanar CT image reconstructions and MIPs were obtained to evaluate the vascular anatomy. Multidetector CT imaging of the chest was performed using the standard protocol during bolus administration of intravenous contrast. Multiplanar CT image reconstructions and MIPs were obtained to evaluate the vascular anatomy. RADIATION DOSE REDUCTION: This exam was performed according to the departmental dose-optimization program which includes automated exposure control, adjustment of the mA and/or kV according to patient size and/or use  of iterative reconstruction technique. CONTRAST:  OMNIPAQUE IOHEXOL 350 MG/ML SOLN COMPARISON:  Same day chest radiograph; CT abdomen and pelvis 01/13/2023 and CT chest 12/30/2022 FINDINGS: Cardiovascular: No pericardial effusion. No pulmonary embolism. Normal caliber aorta. Aortic atherosclerosis. Mediastinum/Nodes: Trachea and esophagus are unremarkable. New mediastinal and right hilar lymphadenopathy. For example 1.0 cm pretracheal node on series 1/image 33 and 1.0 cm right hilar node on series 1/image 41. Lungs/Pleura: Emphysema. Diffuse interlobular septal thickening. There are new bilateral patchy ground-glass opacities in the dependent lungs bilaterally. No pleural effusion or pneumothorax. Musculoskeletal: No acute fracture. Review of the MIP images confirms the above findings. VASCULAR Aorta: No aneurysm or dissection. Scattered mixed density atherosclerotic plaque without hemodynamically significant stenosis. Celiac: Severe narrowing at the origin of the celiac axis. SMA: Patent. Renals: Patent left renal artery. Moderate narrowing at the origin of the right renal artery. IMA: Patent. RIGHT Lower Extremity Inflow: Scattered atherosclerotic plaque without hemodynamically significant narrowing. No aneurysm or dissection. Outflow: Occlusion of the distal  superficial femoral artery with reconstitution of the popliteal artery. Runoff: Three-vessel runoff of the right lower extremity. The distal anterior tibial and peroneal arteries are atretic. Patent dorsalis pedis. LEFT Lower Extremity Inflow: Scattered atherosclerotic plaque without hemodynamically significant narrowing. No aneurysm or dissection. Outflow: Patent.  No aneurysm or dissection. Runoff: Patent three vessel runoff to the ankle. Patent dorsalis pedis. Veins: No obvious venous abnormality within the limitations of this arterial phase study. Review of the MIP images confirms the above findings. NON-VASCULAR Hepatobiliary: No acute abnormality. Pancreas: Unremarkable. Spleen: Unremarkable. Adrenals/Urinary Tract: Normal adrenal glands. Geographic hypoattenuation in the anterior left kidney is unchanged from 01/13/2023 and compatible with infarct. No urinary calculi or hydronephrosis. Unremarkable bladder. Stomach/Bowel: Normal caliber large and small bowel. Colonic diverticulosis without diverticulitis. Appendix and stomach are within normal limits. Lymphatic: No lymphadenopathy. Reproductive: Hysterectomy. Other: No free intraperitoneal fluid or air. Musculoskeletal: No acute fracture. IMPRESSION: 1. Occlusion of the distal right superficial femoral artery with reconstitution of the popliteal artery. 2. No evidence of pulmonary embolism. 3. New bilateral patchy ground-glass opacities in the dependent lungs bilaterally, likely infectious/inflammatory. 4. New mediastinal and right hilar lymphadenopathy, favored reactive. 5. Unchanged left renal infarct. Aortic Atherosclerosis (ICD10-I70.0) and Emphysema (ICD10-J43.9). Critical Value/emergent results were called by telephone at the time of interpretation on 02/14/2023 at 6:50 pm to provider Dr. Wyn Forster, who verbally acknowledged these results. Electronically Signed   By: Minerva Fester M.D.   On: 02/14/2023 18:51   CT Angio Aortobifemoral W and/or Wo  Contrast Result Date: 02/14/2023 CLINICAL DATA:  Right leg pain for 1 week that has gotten progressively worse. Lasix not decreasing swelling. PE suspected. EXAM: CT ANGIOGRAPHY CHEST WITH CONTRAST CT ANGIOGRAPHY OF ABDOMINAL AORTA WITH ILIOFEMORAL RUNOFF TECHNIQUE: Multidetector CT imaging of the abdomen, pelvis and lower extremities was performed using the standard protocol during bolus administration of intravenous contrast. Multiplanar CT image reconstructions and MIPs were obtained to evaluate the vascular anatomy. Multidetector CT imaging of the chest was performed using the standard protocol during bolus administration of intravenous contrast. Multiplanar CT image reconstructions and MIPs were obtained to evaluate the vascular anatomy. RADIATION DOSE REDUCTION: This exam was performed according to the departmental dose-optimization program which includes automated exposure control, adjustment of the mA and/or kV according to patient size and/or use of iterative reconstruction technique. CONTRAST:  OMNIPAQUE IOHEXOL 350 MG/ML SOLN COMPARISON:  Same day chest radiograph; CT abdomen and pelvis 01/13/2023 and CT chest 12/30/2022 FINDINGS: Cardiovascular: No pericardial effusion. No pulmonary  embolism. Normal caliber aorta. Aortic atherosclerosis. Mediastinum/Nodes: Trachea and esophagus are unremarkable. New mediastinal and right hilar lymphadenopathy. For example 1.0 cm pretracheal node on series 1/image 33 and 1.0 cm right hilar node on series 1/image 41. Lungs/Pleura: Emphysema. Diffuse interlobular septal thickening. There are new bilateral patchy ground-glass opacities in the dependent lungs bilaterally. No pleural effusion or pneumothorax. Musculoskeletal: No acute fracture. Review of the MIP images confirms the above findings. VASCULAR Aorta: No aneurysm or dissection. Scattered mixed density atherosclerotic plaque without hemodynamically significant stenosis. Celiac: Severe narrowing at the origin  of the celiac axis. SMA: Patent. Renals: Patent left renal artery. Moderate narrowing at the origin of the right renal artery. IMA: Patent. RIGHT Lower Extremity Inflow: Scattered atherosclerotic plaque without hemodynamically significant narrowing. No aneurysm or dissection. Outflow: Occlusion of the distal superficial femoral artery with reconstitution of the popliteal artery. Runoff: Three-vessel runoff of the right lower extremity. The distal anterior tibial and peroneal arteries are atretic. Patent dorsalis pedis. LEFT Lower Extremity Inflow: Scattered atherosclerotic plaque without hemodynamically significant narrowing. No aneurysm or dissection. Outflow: Patent.  No aneurysm or dissection. Runoff: Patent three vessel runoff to the ankle. Patent dorsalis pedis. Veins: No obvious venous abnormality within the limitations of this arterial phase study. Review of the MIP images confirms the above findings. NON-VASCULAR Hepatobiliary: No acute abnormality. Pancreas: Unremarkable. Spleen: Unremarkable. Adrenals/Urinary Tract: Normal adrenal glands. Geographic hypoattenuation in the anterior left kidney is unchanged from 01/13/2023 and compatible with infarct. No urinary calculi or hydronephrosis. Unremarkable bladder. Stomach/Bowel: Normal caliber large and small bowel. Colonic diverticulosis without diverticulitis. Appendix and stomach are within normal limits. Lymphatic: No lymphadenopathy. Reproductive: Hysterectomy. Other: No free intraperitoneal fluid or air. Musculoskeletal: No acute fracture. IMPRESSION: 1. Occlusion of the distal right superficial femoral artery with reconstitution of the popliteal artery. 2. No evidence of pulmonary embolism. 3. New bilateral patchy ground-glass opacities in the dependent lungs bilaterally, likely infectious/inflammatory. 4. New mediastinal and right hilar lymphadenopathy, favored reactive. 5. Unchanged left renal infarct. Aortic Atherosclerosis (ICD10-I70.0) and Emphysema  (ICD10-J43.9). Critical Value/emergent results were called by telephone at the time of interpretation on 02/14/2023 at 6:50 pm to provider Dr. Wyn Forster, who verbally acknowledged these results. Electronically Signed   By: Minerva Fester M.D.   On: 02/14/2023 18:51   DG Chest Portable 1 View Result Date: 02/14/2023 CLINICAL DATA:  Right leg pain and swelling. EXAM: PORTABLE CHEST 1 VIEW COMPARISON:  12/30/2022. FINDINGS: Bilateral lungs appear hyperlucent with coarse bronchovascular markings, in keeping with COPD. There are atelectatic changes at the left lung base. Bilateral lungs otherwise appear clear. No overt pulmonary edema. No dense consolidation or lung collapse. Bilateral costophrenic angles are clear. Stable cardio-mediastinal silhouette. No acute osseous abnormalities. The soft tissues are within normal limits. IMPRESSION: *No active disease.  COPD. Electronically Signed   By: Jules Schick M.D.   On: 02/14/2023 15:53   US Venous Img Lower Unilateral Right Result Date: 02/14/2023 CLINICAL DATA:  Right lower extremity pain and edema. EXAM: RIGHT LOWER EXTREMITY VENOUS DOPPLER ULTRASOUND TECHNIQUE: Gray-scale sonography with graded compression, as well as color Doppler and duplex ultrasound were performed to evaluate the lower extremity deep venous systems from the level of the common femoral vein and including the common femoral, femoral, profunda femoral, popliteal and calf veins including the posterior tibial, peroneal and gastrocnemius veins when visible. The superficial great saphenous vein was also interrogated. Spectral Doppler was utilized to evaluate flow at rest and with distal augmentation maneuvers in the common femoral, femoral and  popliteal veins. COMPARISON:  None Available. FINDINGS: Contralateral Common Femoral Vein: Respiratory phasicity is normal and symmetric with the symptomatic side. No evidence of thrombus. Normal compressibility. Common Femoral Vein: No evidence of thrombus.  Normal compressibility, respiratory phasicity and response to augmentation. Saphenofemoral Junction: No evidence of thrombus. Normal compressibility and flow on color Doppler imaging. Profunda Femoral Vein: No evidence of thrombus. Normal compressibility and flow on color Doppler imaging. Femoral Vein: No evidence of thrombus. Normal compressibility, respiratory phasicity and response to augmentation. Popliteal Vein: No evidence of thrombus. Normal compressibility, respiratory phasicity and response to augmentation. Calf Veins: No evidence of thrombus. Normal compressibility and flow on color Doppler imaging. Superficial Great Saphenous Vein: No evidence of thrombus. Normal compressibility. Venous Reflux:  None. Other Findings: No evidence of superficial thrombophlebitis or abnormal fluid collection. IMPRESSION: No evidence of right lower extremity deep venous thrombosis. Electronically Signed   By: Irish Lack M.D.   On: 02/14/2023 15:40   Scheduled Meds:  metoprolol succinate  25 mg Oral Daily   pantoprazole  40 mg Oral Daily   sodium chloride flush  3 mL Intravenous Q12H   Continuous Infusions:  sodium chloride Stopped (02/15/23 0803)   heparin 1,400 Units/hr (02/15/23 0832)     LOS: 1 day   Critical Care Procedure Note Authorized and Performed by: Maryln Manuel MD  Total Critical Care time:  62 mins Due to a high probability of clinically significant, life threatening deterioration, the patient required my highest level of preparedness to intervene emergently and I personally spent this critical care time directly and personally managing the patient.  This critical care time included obtaining a history; examining the patient, pulse oximetry; ordering and review of studies; arranging urgent treatment with development of a management plan; evaluation of patient's response of treatment; frequent reassessment; and discussions with other providers.  This critical care time was performed to assess  and manage the high probability of imminent and life threatening deterioration that could result in multi-organ failure.  It was exclusive of separately billable procedures and treating other patients and teaching time.   Standley Dakins, MD How to contact the [facility] Attending or Consulting provider 7A - 7P or covering provider during after hours 7P -7A, for this patient?  Check the care team in [facility] and look for a) attending/consulting TRH provider listed and b) the [facility] team listed Log into [email or web address] to find provider on call.  Locate the [facility] provider you are looking for under [facility] and page to a number that you can be directly reached. If you still have difficulty reaching the provider, please page the [facility] (Director on Call) for the Hospitalists listed on amion for assistance.  02/15/2023, 10:43 AM

## 2023-02-15 NOTE — Hospital Course (Addendum)
Lisa [name] was admitted to the hospital with the working diagnosis of right distal SFA occlusion.   77 y.o. 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  Reports that she developed sudden onset of right foot pain esp involving middle third digits 1.5 weeks ago with associated cyanotic changes in her distal toes and numbness in the toes as well.     Vascular surgery has been consulted with plans for possible angiography on 12/23.  Noted to have acute leukemia and oncology has been consulted, now sp bone marrow biopsy, pending results.   12/22 increased work of breathing, added furosemide x1 dose.

## 2023-02-16 ENCOUNTER — Encounter: Payer: Self-pay | Admitting: Internal Medicine

## 2023-02-16 ENCOUNTER — Encounter (HOSPITAL_COMMUNITY): Payer: Self-pay | Admitting: Internal Medicine

## 2023-02-16 DIAGNOSIS — I743 Embolism and thrombosis of arteries of the lower extremities: Secondary | ICD-10-CM | POA: Diagnosis not present

## 2023-02-16 DIAGNOSIS — I70209 Unspecified atherosclerosis of native arteries of extremities, unspecified extremity: Secondary | ICD-10-CM | POA: Diagnosis not present

## 2023-02-16 LAB — COMPREHENSIVE METABOLIC PANEL
ALT: 12 U/L (ref 0–44)
AST: 19 U/L (ref 15–41)
Albumin: 2.6 g/dL — ABNORMAL LOW (ref 3.5–5.0)
Alkaline Phosphatase: 101 U/L (ref 38–126)
Anion gap: 6 (ref 5–15)
BUN: 5 mg/dL — ABNORMAL LOW (ref 8–23)
CO2: 25 mmol/L (ref 22–32)
Calcium: 7.7 mg/dL — ABNORMAL LOW (ref 8.9–10.3)
Chloride: 106 mmol/L (ref 98–111)
Creatinine, Ser: 1.07 mg/dL — ABNORMAL HIGH (ref 0.44–1.00)
GFR, Estimated: 53 mL/min — ABNORMAL LOW (ref >60)
Glucose, Bld: 127 mg/dL — ABNORMAL HIGH (ref 70–99)
Potassium: 3 mmol/L — ABNORMAL LOW (ref 3.5–5.1)
Sodium: 137 mmol/L (ref 135–145)
Total Bilirubin: 0.7 mg/dL (ref <1.2)
Total Protein: 5.7 g/dL — ABNORMAL LOW (ref 6.5–8.1)

## 2023-02-16 LAB — CBC WITH DIFFERENTIAL/PLATELET
Abs Immature Granulocytes: 0 10*3/uL (ref 0.00–0.07)
Basophils Absolute: 0.1 10*3/uL (ref 0.0–0.1)
Basophils Relative: 0 %
Eosinophils Absolute: 0.2 10*3/uL (ref 0.0–0.5)
Eosinophils Relative: 0 %
HCT: 30.7 % — ABNORMAL LOW (ref 36.0–46.0)
Hemoglobin: 9.5 g/dL — ABNORMAL LOW (ref 12.0–15.0)
Immature Granulocytes: 0 %
Lymphocytes Relative: 11 %
Lymphs Abs: 6.1 10*3/uL — ABNORMAL HIGH (ref 0.7–4.0)
MCH: 30 pg (ref 26.0–34.0)
MCHC: 30.9 g/dL (ref 30.0–36.0)
MCV: 96.8 fL (ref 80.0–100.0)
Monocytes Absolute: 20.7 10*3/uL — ABNORMAL HIGH (ref 0.1–1.0)
Monocytes Relative: 36 %
Neutro Abs: 29.8 10*3/uL — ABNORMAL HIGH (ref 1.7–7.7)
Neutrophils Relative %: 53 %
Platelets: 53 10*3/uL — ABNORMAL LOW (ref 150–400)
RBC: 3.17 MIL/uL — ABNORMAL LOW (ref 3.87–5.11)
RDW: 19.9 % — ABNORMAL HIGH (ref 11.5–15.5)
WBC: 56.9 10*3/uL (ref 4.0–10.5)
nRBC: 0.2 % (ref 0.0–0.2)

## 2023-02-16 LAB — MAGNESIUM: Magnesium: 2.2 mg/dL (ref 1.7–2.4)

## 2023-02-16 LAB — URIC ACID: Uric Acid, Serum: 5.2 mg/dL (ref 2.5–7.1)

## 2023-02-16 LAB — APTT: aPTT: 83 s — ABNORMAL HIGH (ref 24–36)

## 2023-02-16 LAB — HEPARIN LEVEL (UNFRACTIONATED): Heparin Unfractionated: 1.08 [IU]/mL — ABNORMAL HIGH (ref 0.30–0.70)

## 2023-02-16 LAB — SURGICAL PATHOLOGY

## 2023-02-16 MED ORDER — OXYCODONE HCL 5 MG PO TABS: 5.0000 mg | ORAL_TABLET | ORAL | Status: DC | PRN

## 2023-02-16 MED ORDER — DIAZEPAM 2 MG PO TABS
2.0000 mg | ORAL_TABLET | Freq: Once | ORAL | Status: AC
  Administered 2023-02-16: 2 mg via ORAL

## 2023-02-16 MED ORDER — MORPHINE SULFATE (PF) 2 MG/ML IV SOLN
2.0000 mg | INTRAVENOUS | Status: DC | PRN
  Administered 2023-02-16 – 2023-02-19 (×4): 2 mg via INTRAVENOUS
  Filled 2023-02-16 (×4): qty 1

## 2023-02-16 MED ORDER — ACETAMINOPHEN 325 MG PO TABS: 650.0000 mg | ORAL_TABLET | Freq: Four times a day (QID) | ORAL | Status: DC | PRN

## 2023-02-16 MED ORDER — OXYCODONE HCL 5 MG PO TABS
5.0000 mg | ORAL_TABLET | ORAL | Status: DC | PRN
  Administered 2023-02-16: 5 mg via ORAL
  Filled 2023-02-16: qty 1

## 2023-02-16 MED ORDER — ACETAMINOPHEN 500 MG PO TABS
1000.0000 mg | ORAL_TABLET | Freq: Three times a day (TID) | ORAL | Status: DC
  Administered 2023-02-16 – 2023-02-20 (×12): 1000 mg via ORAL
  Filled 2023-02-16 (×13): qty 2

## 2023-02-16 MED ORDER — POTASSIUM CHLORIDE CRYS ER 20 MEQ PO TBCR
40.0000 meq | EXTENDED_RELEASE_TABLET | Freq: Two times a day (BID) | ORAL | Status: AC
  Administered 2023-02-16 (×2): 40 meq via ORAL
  Filled 2023-02-16 (×2): qty 2

## 2023-02-16 MED ORDER — PROCHLORPERAZINE EDISYLATE 10 MG/2ML IJ SOLN: 5.0000 mg | Freq: Four times a day (QID) | INTRAMUSCULAR | Status: DC | PRN

## 2023-02-16 MED ORDER — ACETAMINOPHEN 500 MG PO TABS: 1000.0000 mg | ORAL_TABLET | Freq: Three times a day (TID) | ORAL | Status: DC

## 2023-02-16 MED ORDER — OXYCODONE HCL 5 MG PO TABS
10.0000 mg | ORAL_TABLET | ORAL | Status: DC | PRN
  Administered 2023-02-16 (×2): 10 mg via ORAL
  Filled 2023-02-16 (×2): qty 2

## 2023-02-16 NOTE — Consult Note (Signed)
Chief Complaint: Leukocytosis, mild anemia and acute thrombocytopenia   Referring Provider(s): Bertis Ruddy  Supervising Physician: Marliss Coots  Patient Status: [facility] - In-pt  History of Present Illness: Lisa [name] is a 77 y.o. female with medical issues including CAD with history PCI, PAD, HFpEF, and COPD (smokes 1/2 ppd since teens).  She was hospitalized in November for a left renal infarct and left renal artery thromboembolism.  She was started on anticoagulation with Eliquis.  Her CBC at that time was within acceptable limits.  She presented to [facility] ED c/o a painful right foot.  Labs showed profound Leukocytosis = 55.9 and Thrombocytopenia = 57 K  [facility] and Vascular Surgery were consulted.  Dr. Verita Lamb note reads = ... thrombocytopenia makes vascular intervention risky at this moment. This will need further workup before we proceed with intervention.   Dr. Bertis Ruddy recommended bone marrow biopsy and placement of a Port A Cath.  Patient is Full Code  Past Medical History:  Diagnosis Date   Asthma    COPD (chronic obstructive pulmonary disease) (HCC)    Coronary artery disease    Cough    " ALL MY LIFE "   Dyspnea    GERD (gastroesophageal reflux disease)    History of blood clots    patient had a blood clot in her kidney 01/03/2023.  pt is on Eliquis   Hypertension    NSTEMI (non-ST elevated myocardial infarction) (HCC) 09/01/2016   DES to Cx/OM bifurcation   Unstable angina (HCC) 08/2016    Past Surgical History:  Procedure Laterality Date   ABDOMINAL HYSTERECTOMY     COLONOSCOPY WITH PROPOFOL N/A 01/16/2023   Procedure: COLONOSCOPY WITH PROPOFOL;  Surgeon: Lanelle Bal, DO;  Location: [facility] SUITE;  Service: Endoscopy;  Laterality: N/A;  845am, asa 3   CORONARY STENT INTERVENTION  09/05/2016   Successful complex PCI of the circumflex/OM bifurcation using a Synergy DES   CORONARY STENT INTERVENTION N/A 09/05/2016   Procedure: Coronary Stent  Intervention;  Surgeon: Tonny Bollman, MD;  Location: [facility] INVASIVE CV LAB;  Service: Cardiovascular;  Laterality: N/A;   LEFT HEART CATH AND CORONARY ANGIOGRAPHY N/A 09/02/2016   Procedure: Left Heart Cath and Coronary Angiography;  Surgeon: Corky Crafts, MD;  Location: [facility] INVASIVE CV LAB;  Service: Cardiovascular;  Laterality: N/A;   POLYPECTOMY  01/16/2023   Procedure: POLYPECTOMY;  Surgeon: Lanelle Bal, DO;  Location: [facility] SUITE;  Service: Endoscopy;;    Allergies: Brilinta [ticagrelor], Penicillins, Ranexa [ranolazine], Sulfa antibiotics, and Crestor [rosuvastatin]  Medications: Prior to Admission medications   Medication Sig Start Date End Date Taking? Authorizing Provider  acetaminophen (TYLENOL) 500 MG tablet Take 1,000 mg by mouth every 6 (six) hours as needed for moderate pain.   Yes [provider]  apixaban (ELIQUIS) 5 MG TABS tablet Take 1 tablet (5 mg total) by mouth 2 (two) times daily. 02/08/23  Yes Pricilla Riffle, MD  Cholecalciferol (VITAMIN D3) 125 MCG (5000 UT) CAPS Take 5,000 Units by mouth daily.   Yes [provider]  Coenzyme Q10 (COQ10) 100 MG CAPS Take 100 mg by mouth daily.   Yes [provider]  Cyanocobalamin (VITAMIN B-12 PO) Take 1 tablet by mouth daily.   Yes [provider]  diazepam (VALIUM) 2 MG tablet Take 2 mg by mouth daily as needed for anxiety. 03/16/20  Yes [provider]  docusate sodium (COLACE) 100 MG capsule Take 100 mg by mouth daily.   Yes  [provider]  EPINEPHrine 0.3 mg/0.3 mL IJ SOAJ injection Inject 0.3 mg into the muscle as needed for anaphylaxis. 08/19/22  Yes [provider]  esomeprazole (NEXIUM) 20 MG capsule Take 20 mg by mouth daily at 12 noon.   Yes [provider]  fluticasone-salmeterol (ADVAIR HFA) 115-21 MCG/ACT inhaler Inhale 2 puffs into the lungs at bedtime.   Yes [provider]  furosemide (LASIX) 20 MG tablet Take 20 mg by mouth  daily as needed for fluid or edema. 12/14/22  Yes [provider]  Glucosamine-Chondroitin (MOVE FREE PO) Take 1 tablet by mouth daily.   Yes [provider]  lisinopril (ZESTRIL) 20 MG tablet Take 1 tablet (20 mg total) by mouth daily. 01/06/23  Yes Elgergawy, Leana Roe, MD  metoprolol succinate (TOPROL XL) 25 MG 24 hr tablet Take 0.5 tablets (12.5 mg total) by mouth 2 (two) times daily. Patient taking differently: Take 25 mg by mouth daily. 04/08/19  Yes Pricilla Riffle, MD  nitroGLYCERIN (NITROSTAT) 0.4 MG SL tablet Place 1 tablet (0.4 mg total) under the tongue every 5 (five) minutes as needed for chest pain. 09/06/16  Yes Duke, Roe Rutherford, PA  potassium chloride (KLOR-CON M) 10 MEQ tablet Take 10 mEq by mouth as needed. 12/14/22  Yes [provider]  Probiotic Product (PROBIOTIC PO) Take 1 capsule by mouth daily.   Yes [provider]     Family History  Problem Relation Age of Onset   Asthma Mother    Heart attack Father 43   Heart attack Son 51   Prostate cancer Brother     Social History   Socioeconomic History   Marital status: Divorced    Spouse name: Not on file   Number of children: Not on file   Years of education: Not on file   Highest education level: Not on file  Occupational History   Not on file  Tobacco Use   Smoking status: Every Day    Current packs/day: 1.00    Average packs/day: 1 pack/day for 50.0 years (50.0 ttl pk-yrs)    Types: Cigarettes   Smokeless tobacco: Never  Vaping Use   Vaping status: Never Used  Substance and Sexual Activity   Alcohol use: Not Currently    Comment: occasional   Drug use: No   Sexual activity: Yes  Other Topics Concern   Not on file  Social History Narrative   Not on file   Social Drivers of Health   Financial Resource Strain: Low Risk  (07/14/2022)   Received from [facility]   Overall Financial Resource Strain (CARDIA)    Difficulty of Paying Living Expenses:  Not hard at all  Food Insecurity: No Food Insecurity (02/15/2023)   Hunger Vital Sign    Worried About Running Out of Food in the Last Year: Never true    Ran Out of Food in the Last Year: Never true  Transportation Needs: No Transportation Needs (02/15/2023)   PRAPARE - Administrator, Civil Service (Medical): No    Lack of Transportation (Non-Medical): No  Physical Activity: Not on file  Stress: Not on file  Social Connections: Not on file     Review of Systems: A 12 point ROS discussed and pertinent positives are indicated in the HPI above.  All other systems are negative.   Vital Signs: BP (!) 115/54 (BP Location: Right Arm)   Pulse 76   Temp 97.6 F (36.4 C) (  Oral)   Resp 13   Ht 5\' 7"  (1.702 m)   Wt 184 lb 14.4 oz (83.9 kg)   LMP 09/01/2016 (LMP Unknown)   SpO2 95%   BMI 28.96 kg/m   Advance Care Plan: The advanced care place/surrogate decision maker was discussed at the time of visit and the patient did not wish to discuss or was not able to name a surrogate decision maker or provide an advance care plan.  Physical Exam Vitals reviewed.  Constitutional:      Appearance: Normal appearance.  HENT:     Head: Normocephalic and atraumatic.  Eyes:     Extraocular Movements: Extraocular movements intact.  Cardiovascular:     Rate and Rhythm: Normal rate and regular rhythm.  Pulmonary:     Effort: Pulmonary effort is normal. No respiratory distress.     Breath sounds: Normal breath sounds.  Abdominal:     Palpations: Abdomen is soft.  Musculoskeletal:        General: Normal range of motion.     Cervical back: Normal range of motion.  Skin:    General: Skin is warm and dry.  Neurological:     General: No focal deficit present.     Mental Status: She is alert and oriented to person, place, and time.  Psychiatric:        Mood and Affect: Mood normal.        Behavior: Behavior normal.        Thought Content: Thought content normal.        Judgment:  Judgment normal.     Imaging:   Labs:  CBC: Recent Labs    02/14/23 2041 02/15/23 0605 02/15/23 1534 02/16/23 0324  WBC 63.0* 60.6* 49.0* 56.9*  HGB 11.0* 9.9* 10.3* 9.5*  HCT 34.6* 31.2* 32.6* 30.7*  PLT 71* 57* 57* 53*    COAGS: Recent Labs    02/14/23 2100 02/15/23 0605 02/15/23 1534 02/15/23 2051 02/16/23 0324  INR 1.5*  --   --   --   --   APTT 32 63* 63* 83* 83*    BMP: Recent Labs    01/12/23 2147 02/14/23 1413 02/15/23 0605 02/16/23 0324  NA 140 141 138 137  K 4.5 2.8* 3.9 3.0*  CL 104 105 105 106  CO2 27 27 24 25   GLUCOSE 129* 113* 116* 127*  BUN 14 7* 6* 5*  CALCIUM 9.0 8.7* 7.9* 7.7*  CREATININE 0.99 1.05* 0.95 1.07*  GFRNONAA 58* 54* >60 53*    LIVER FUNCTION TESTS: Recent Labs    12/31/22 0441 01/12/23 2147 02/14/23 2041 02/16/23 0324  BILITOT 0.5 0.5 0.5 0.7  AST 28 14* 15 19  ALT 65* 14 11 12   ALKPHOS 92 111 107 101  PROT 6.5 7.4 6.8 5.7*  ALBUMIN 2.6* 3.5 3.2* 2.6*    TUMOR MARKERS: No results for input(s): "AFPTM", "CEA", "CA199", "CHROMGRNA" in the last 8760 hours.  Assessment and Plan:  Leukocytosis, mild anemia and acute thrombocytopenia.  Will plan for bone marrow biopsy tomorrow and placement of a Port A Cath tomorrow by Dr. Archer Asa.  Risks and benefits of bone marrow biopsy was discussed with the patient and/or patient's family including, but not limited to bleeding, infection, damage to adjacent structures or low yield requiring additional tests.  Risks and benefits of image guided port-a-catheter placement was discussed with the patient including, but not limited to bleeding, infection, pneumothorax, or fibrin sheath development and need for additional procedures.  All of the  questions were answered and there is agreement to proceed.  Consent signed and in chart.  Thank you for allowing our service to participate in Lisa [name] 's care.  Electronically Signed: Gwynneth Macleod, PA-C   02/16/2023,  7:57 AM      I spent a total of 40 Minutes in face to face in clinical consultation, greater than 50% of which was counseling/coordinating care for bone marrow biopsy and Port placement.

## 2023-02-16 NOTE — Progress Notes (Addendum)
  Progress Note    02/16/2023 7:55 AM * No surgery found *  Subjective:  rest pain in R foot overnight   Vitals:   02/16/23 0008 02/16/23 0500  BP: (!) 115/54   Pulse: 76   Resp: 20 13  Temp: 97.6 F (36.4 C)   SpO2: 95%    Physical Exam: Lungs:  non labored Extremities:  blue toes R foot; motor and sensation intact R foot Abdomen:  soft, NT, ND Neurologic: A&O  CBC    Component Value Date/Time   WBC 56.9 (HH) 02/16/2023 0324   RBC 3.17 (L) 02/16/2023 0324   HGB 9.5 (L) 02/16/2023 0324   HCT 30.7 (L) 02/16/2023 0324   PLT 53 (L) 02/16/2023 0324   MCV 96.8 02/16/2023 0324   MCH 30.0 02/16/2023 0324   MCHC 30.9 02/16/2023 0324   RDW 19.9 (H) 02/16/2023 0324   LYMPHSABS 6.1 (H) 02/16/2023 0324   MONOABS 20.7 (H) 02/16/2023 0324   EOSABS 0.2 02/16/2023 0324   BASOSABS 0.1 02/16/2023 0324    BMET    Component Value Date/Time   NA 137 02/16/2023 0324   K 3.0 (L) 02/16/2023 0324   CL 106 02/16/2023 0324   CO2 25 02/16/2023 0324   GLUCOSE 127 (H) 02/16/2023 0324   BUN 5 (L) 02/16/2023 0324   CREATININE 1.07 (H) 02/16/2023 0324   CALCIUM 7.7 (L) 02/16/2023 0324   GFRNONAA 53 (L) 02/16/2023 0324   GFRAA >60 02/24/2018 1937    INR    Component Value Date/Time   INR 1.5 (H) 02/14/2023 2100     Intake/Output Summary (Last 24 hours) at 02/16/2023 0755 Last data filed at 02/16/2023 0452 Gross per 24 hour  Intake 1527.01 ml  Output --  Net 1527.01 ml     Assessment/Plan:  77 y.o. female with renal infarct, R SFA thrombus, acute leukemia  Rest pain overnight in toes of R foot.  Motor and sensation remains intact Continue IV heparin for now Platelets down to 53, 000; Oncology following for acute leukemia Vascular will continue to follow closely; no indication for urgent intervention currently   Emilie Rutter, PA-C [facility] [phone] 02/16/2023 7:55 AM   VASCULAR STAFF ADDENDUM: I have independently interviewed and  examined the patient. I agree with the above.  Tentatively posted for Monday for R SFA intervention with Dr. Hetty Blend. Her platelet counts will need to improve for intervention to be safe.  Thankfully, her leg is not acutely threatened at this moment.   Rande Brunt. Lenell Antu, MD [facility] Phone Number: [phone] 02/16/2023 10:26 AM

## 2023-02-16 NOTE — Consult Note (Signed)
PHARMACY - ANTICOAGULATION CONSULT NOTE  Pharmacy Consult for IV Heparin Indication:  Occlusion superficial femoral artery  Patient Measurements: Height: 5\' 7"  (170.2 cm) Weight: 83.9 kg (184 lb 14.4 oz) IBW/kg (Calculated) : 61.6 Heparin Dosing Weight: 77.3 kg  Labs: Recent Labs    02/14/23 1413 02/14/23 1557 02/14/23 2041 02/14/23 2100 02/15/23 0605 02/15/23 1534 02/15/23 2051 02/16/23 0324  HGB 11.7*  --    < >  --  9.9* 10.3*  --  9.5*  HCT 37.1  --    < >  --  31.2* 32.6*  --  30.7*  PLT 57*  --    < >  --  57* 57*  --  53*  APTT  --   --    < > 32 63* 63* 83* 83*  LABPROT  --   --   --  18.5*  --   --   --   --   INR  --   --   --  1.5*  --   --   --   --   HEPARINUNFRC  --   --   --  >1.10* >1.10*  --   --  1.08*  CREATININE 1.05*  --   --   --  0.95  --   --  1.07*  TROPONINIHS 12 12  --   --   --   --   --   --    < > = values in this interval not displayed.    Estimated Creatinine Clearance: 47.4 mL/min (A) (by C-G formula based on SCr of 1.07 mg/dL (H)).  Medical History: Past Medical History:  Diagnosis Date   Asthma    COPD (chronic obstructive pulmonary disease) (HCC)    Coronary artery disease    Cough    " ALL MY LIFE "   Dyspnea    GERD (gastroesophageal reflux disease)    History of blood clots    patient had a blood clot in her kidney 01/03/2023.  pt is on Eliquis   Hypertension    NSTEMI (non-ST elevated myocardial infarction) (HCC) 09/01/2016   DES to Cx/OM bifurcation   Unstable angina (HCC) 08/2016    Medications:  Apixaban 5 mg BID prior to admission, last dose unknown  Assessment: 77 y/o F with medical history as above and including history of blood clots on apixaban presenting with right leg pain with swelling. 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 Pharmacy consulted to initiate and manage heparin infusion for occlusion superficial femoral artery.  HL 1.08,  supratherapeutic d/t Eliquis. aPTT 83, therapeutic, with heparin rate at 1,400 units/hour. CBC- hgb 9.5 and plts 53. Plts remain in the 50-70s. No infusion issues or bleeding noted.  Goal of Therapy:  Heparin level 0.3-0.7 units/ml aPTT 66 - 102 seconds Monitor platelets by anticoagulation protocol: Yes   Plan:  Continue heparin infusion at 1400 units/hr Daily aPTT/heparin level, CBC Continue to monitor H&H and platelets.  Roslyn Smiling, PharmD PGY1 Pharmacy Resident 02/16/2023 7:58 AM

## 2023-02-16 NOTE — Progress Notes (Signed)
Lisa [name]   DOB:16-Feb-1946   YN#:[id number]    ASSESSMENT & PLAN:  #1 acute leukemia I have a long discussion with the patient, her sister and her daughter who are by her bedside on 12/18.  Overall, her presentation is consistent with diagnosis of acute myelogenous leukemia.  Subtype is unknown.  We discussed risk and benefits of transferring her to tertiary center versus keeping her here.  The patient is made aware that in general, we are not equipped to treat patients aggressively for acute leukemia.  The patient declined transfer.  She agrees to proceed with bone marrow aspirate and biopsy. Potentially, she is interested in systemic chemotherapy such as hypomethylating agents if that could be offered locally.  We discussed risk and benefits of treating her in [place] versus at any [facility]   We discussed risk and benefits of starting her on hydroxyurea and she is in agreement.  She was started on hydroxyurea at 1500 mg daily on December 18 reduce her white blood cell count.  She is aware that she could be more pancytopenic moving forward.   We discussed risk and benefits of port placement and she agreed to proceed.   I recommend aggressive IV fluid hydration overnight to improve her blood flow.  This would be at slight risk of more leg swelling but could reduce her risk of lower limb ischemia.  I will also start her on allopurinol for tumor lysis prophylaxis  She does not need platelet transfusion or blood transfusion right now   #2 Critical limb ischemia I suspect it could be precipitated by presence of blasts causing circulation issues.  She will continue medical management with IV heparin.  As above, I think it is important that we try to side to reduce her with Hydrea and to increase IV fluids.  The patient is not capable of drinking more than 60 ounces of liquid per day If she needs urgent surgery, she can benefit from preoperative platelet transfusion followed by intraoperative platelet  transfusion as well as post operative platelet transfusion.  It is not realistic to expect her platelet count to go up to over 100,000 prior to surgery If she cannot have surgery here, consider transfer to tertiary center   #3 goals of care discussion The patient is aware of poor prognosis associated with acute leukemia.  She is interested for systemic chemotherapy that could be offered locally.  We discussed importance of advanced directives and living will.  The patient would like her CODE STATUS will be full code.  She appointed her daughter, [name] as her medical healthcare power of attorney in the event that she is not able to make medical decisions for herself  Will follow tomorrow  All questions were answered. The patient knows to call the clinic with any problems, questions or concerns.   The total time spent in the appointment was 40 minutes encounter with patients including review of chart and various tests results, discussions about plan of care and coordination of care plan  Artis Delay, MD 02/16/2023 4:40 PM  Subjective:  She is seen today.  She did not get IV fluid started until this morning.  She noticed some worsening ischemic right foot pain at rest but felt better when she started moving around.  The pain is tolerable.  She denies bleeding  Objective:  Vitals:   02/16/23 0809 02/16/23 1241  BP: 124/63 100/67  Pulse: 77 70  Resp: 16 20  Temp: 98.2 F (36.8 C) 98.2 F (36.8 C)  SpO2: 94% 93%     Intake/Output Summary (Last 24 hours) at 02/16/2023 1640 Last data filed at 02/16/2023 1633 Gross per 24 hour  Intake 1885.71 ml  Output --  Net 1885.71 ml    GENERAL:alert, no distress and comfortable Musculoskeletal: Noted peripheral cyanosis on the right lower extremity NEURO: alert & oriented x 3 with fluent speech, no focal motor/sensory deficits   Labs:  Recent Labs    12/30/22 1016 12/31/22 0441 01/12/23 2147 02/14/23 1413 02/14/23 2041 02/15/23 0605  02/16/23 0324  NA  --    < > 140 141  --  138 137  K  --    < > 4.5 2.8*  --  3.9 3.0*  CL  --    < > 104 105  --  105 106  CO2  --    < > 27 27  --  24 25  GLUCOSE  --    < > 129* 113*  --  116* 127*  BUN  --    < > 14 7*  --  6* 5*  CREATININE  --    < > 0.99 1.05*  --  0.95 1.07*  CALCIUM  --    < > 9.0 8.7*  --  7.9* 7.7*  GFRNONAA  --    < > 58* 54*  --  >60 53*  PROT 6.4*   < > 7.4  --  6.8  --  5.7*  ALBUMIN 2.8*   < > 3.5  --  3.2*  --  2.6*  AST 48*   < > 14*  --  15  --  19  ALT 80*   < > 14  --  11  --  12  ALKPHOS 87   < > 111  --  107  --  101  BILITOT 1.2   < > 0.5  --  0.5  --  0.7  BILIDIR 0.4*  --   --   --  0.1  --   --   IBILI 0.8  --   --   --  0.4  --   --    < > = values in this interval not displayed.    Studies:  CT Angio Chest PE W and/or Wo Contrast Result Date: 02/14/2023 CLINICAL DATA:  Right leg pain for 1 week that has gotten progressively worse. Lasix not decreasing swelling. PE suspected. EXAM: CT ANGIOGRAPHY CHEST WITH CONTRAST CT ANGIOGRAPHY OF ABDOMINAL AORTA WITH ILIOFEMORAL RUNOFF TECHNIQUE: Multidetector CT imaging of the abdomen, pelvis and lower extremities was performed using the standard protocol during bolus administration of intravenous contrast. Multiplanar CT image reconstructions and MIPs were obtained to evaluate the vascular anatomy. Multidetector CT imaging of the chest was performed using the standard protocol during bolus administration of intravenous contrast. Multiplanar CT image reconstructions and MIPs were obtained to evaluate the vascular anatomy. RADIATION DOSE REDUCTION: This exam was performed according to the departmental dose-optimization program which includes automated exposure control, adjustment of the mA and/or kV according to patient size and/or use of iterative reconstruction technique. CONTRAST:  OMNIPAQUE IOHEXOL 350 MG/ML SOLN COMPARISON:  Same day chest radiograph; CT abdomen and pelvis 01/13/2023 and CT chest  12/30/2022 FINDINGS: Cardiovascular: No pericardial effusion. No pulmonary embolism. Normal caliber aorta. Aortic atherosclerosis. Mediastinum/Nodes: Trachea and esophagus are unremarkable. New mediastinal and right hilar lymphadenopathy. For example 1.0 cm pretracheal node on series 1/image 33 and 1.0 cm right hilar node on series 1/image 41. Lungs/Pleura:  Emphysema. Diffuse interlobular septal thickening. There are new bilateral patchy ground-glass opacities in the dependent lungs bilaterally. No pleural effusion or pneumothorax. Musculoskeletal: No acute fracture. Review of the MIP images confirms the above findings. VASCULAR Aorta: No aneurysm or dissection. Scattered mixed density atherosclerotic plaque without hemodynamically significant stenosis. Celiac: Severe narrowing at the origin of the celiac axis. SMA: Patent. Renals: Patent left renal artery. Moderate narrowing at the origin of the right renal artery. IMA: Patent. RIGHT Lower Extremity Inflow: Scattered atherosclerotic plaque without hemodynamically significant narrowing. No aneurysm or dissection. Outflow: Occlusion of the distal superficial femoral artery with reconstitution of the popliteal artery. Runoff: Three-vessel runoff of the right lower extremity. The distal anterior tibial and peroneal arteries are atretic. Patent dorsalis pedis. LEFT Lower Extremity Inflow: Scattered atherosclerotic plaque without hemodynamically significant narrowing. No aneurysm or dissection. Outflow: Patent.  No aneurysm or dissection. Runoff: Patent three vessel runoff to the ankle. Patent dorsalis pedis. Veins: No obvious venous abnormality within the limitations of this arterial phase study. Review of the MIP images confirms the above findings. NON-VASCULAR Hepatobiliary: No acute abnormality. Pancreas: Unremarkable. Spleen: Unremarkable. Adrenals/Urinary Tract: Normal adrenal glands. Geographic hypoattenuation in the anterior left kidney is unchanged from  01/13/2023 and compatible with infarct. No urinary calculi or hydronephrosis. Unremarkable bladder. Stomach/Bowel: Normal caliber large and small bowel. Colonic diverticulosis without diverticulitis. Appendix and stomach are within normal limits. Lymphatic: No lymphadenopathy. Reproductive: Hysterectomy. Other: No free intraperitoneal fluid or air. Musculoskeletal: No acute fracture. IMPRESSION: 1. Occlusion of the distal right superficial femoral artery with reconstitution of the popliteal artery. 2. No evidence of pulmonary embolism. 3. New bilateral patchy ground-glass opacities in the dependent lungs bilaterally, likely infectious/inflammatory. 4. New mediastinal and right hilar lymphadenopathy, favored reactive. 5. Unchanged left renal infarct. Aortic Atherosclerosis (ICD10-I70.0) and Emphysema (ICD10-J43.9). Critical Value/emergent results were called by telephone at the time of interpretation on 02/14/2023 at 6:50 pm to provider Dr. Wyn Forster, who verbally acknowledged these results. Electronically Signed   By: Minerva Fester M.D.   On: 02/14/2023 18:51   CT Angio Aortobifemoral W and/or Wo Contrast Result Date: 02/14/2023 CLINICAL DATA:  Right leg pain for 1 week that has gotten progressively worse. Lasix not decreasing swelling. PE suspected. EXAM: CT ANGIOGRAPHY CHEST WITH CONTRAST CT ANGIOGRAPHY OF ABDOMINAL AORTA WITH ILIOFEMORAL RUNOFF TECHNIQUE: Multidetector CT imaging of the abdomen, pelvis and lower extremities was performed using the standard protocol during bolus administration of intravenous contrast. Multiplanar CT image reconstructions and MIPs were obtained to evaluate the vascular anatomy. Multidetector CT imaging of the chest was performed using the standard protocol during bolus administration of intravenous contrast. Multiplanar CT image reconstructions and MIPs were obtained to evaluate the vascular anatomy. RADIATION DOSE REDUCTION: This exam was performed according to the  departmental dose-optimization program which includes automated exposure control, adjustment of the mA and/or kV according to patient size and/or use of iterative reconstruction technique. CONTRAST:  OMNIPAQUE IOHEXOL 350 MG/ML SOLN COMPARISON:  Same day chest radiograph; CT abdomen and pelvis 01/13/2023 and CT chest 12/30/2022 FINDINGS: Cardiovascular: No pericardial effusion. No pulmonary embolism. Normal caliber aorta. Aortic atherosclerosis. Mediastinum/Nodes: Trachea and esophagus are unremarkable. New mediastinal and right hilar lymphadenopathy. For example 1.0 cm pretracheal node on series 1/image 33 and 1.0 cm right hilar node on series 1/image 41. Lungs/Pleura: Emphysema. Diffuse interlobular septal thickening. There are new bilateral patchy ground-glass opacities in the dependent lungs bilaterally. No pleural effusion or pneumothorax. Musculoskeletal: No acute fracture. Review of the MIP images confirms the above  findings. VASCULAR Aorta: No aneurysm or dissection. Scattered mixed density atherosclerotic plaque without hemodynamically significant stenosis. Celiac: Severe narrowing at the origin of the celiac axis. SMA: Patent. Renals: Patent left renal artery. Moderate narrowing at the origin of the right renal artery. IMA: Patent. RIGHT Lower Extremity Inflow: Scattered atherosclerotic plaque without hemodynamically significant narrowing. No aneurysm or dissection. Outflow: Occlusion of the distal superficial femoral artery with reconstitution of the popliteal artery. Runoff: Three-vessel runoff of the right lower extremity. The distal anterior tibial and peroneal arteries are atretic. Patent dorsalis pedis. LEFT Lower Extremity Inflow: Scattered atherosclerotic plaque without hemodynamically significant narrowing. No aneurysm or dissection. Outflow: Patent.  No aneurysm or dissection. Runoff: Patent three vessel runoff to the ankle. Patent dorsalis pedis. Veins: No obvious venous abnormality within  the limitations of this arterial phase study. Review of the MIP images confirms the above findings. NON-VASCULAR Hepatobiliary: No acute abnormality. Pancreas: Unremarkable. Spleen: Unremarkable. Adrenals/Urinary Tract: Normal adrenal glands. Geographic hypoattenuation in the anterior left kidney is unchanged from 01/13/2023 and compatible with infarct. No urinary calculi or hydronephrosis. Unremarkable bladder. Stomach/Bowel: Normal caliber large and small bowel. Colonic diverticulosis without diverticulitis. Appendix and stomach are within normal limits. Lymphatic: No lymphadenopathy. Reproductive: Hysterectomy. Other: No free intraperitoneal fluid or air. Musculoskeletal: No acute fracture. IMPRESSION: 1. Occlusion of the distal right superficial femoral artery with reconstitution of the popliteal artery. 2. No evidence of pulmonary embolism. 3. New bilateral patchy ground-glass opacities in the dependent lungs bilaterally, likely infectious/inflammatory. 4. New mediastinal and right hilar lymphadenopathy, favored reactive. 5. Unchanged left renal infarct. Aortic Atherosclerosis (ICD10-I70.0) and Emphysema (ICD10-J43.9). Critical Value/emergent results were called by telephone at the time of interpretation on 02/14/2023 at 6:50 pm to provider Dr. Wyn Forster, who verbally acknowledged these results. Electronically Signed   By: Minerva Fester M.D.   On: 02/14/2023 18:51   DG Chest Portable 1 View Result Date: 02/14/2023 CLINICAL DATA:  Right leg pain and swelling. EXAM: PORTABLE CHEST 1 VIEW COMPARISON:  12/30/2022. FINDINGS: Bilateral lungs appear hyperlucent with coarse bronchovascular markings, in keeping with COPD. There are atelectatic changes at the left lung base. Bilateral lungs otherwise appear clear. No overt pulmonary edema. No dense consolidation or lung collapse. Bilateral costophrenic angles are clear. Stable cardio-mediastinal silhouette. No acute osseous abnormalities. The soft tissues are within  normal limits. IMPRESSION: *No active disease.  COPD. Electronically Signed   By: Jules Schick M.D.   On: 02/14/2023 15:53   US Venous Img Lower Unilateral Right Result Date: 02/14/2023 CLINICAL DATA:  Right lower extremity pain and edema. EXAM: RIGHT LOWER EXTREMITY VENOUS DOPPLER ULTRASOUND TECHNIQUE: Gray-scale sonography with graded compression, as well as color Doppler and duplex ultrasound were performed to evaluate the lower extremity deep venous systems from the level of the common femoral vein and including the common femoral, femoral, profunda femoral, popliteal and calf veins including the posterior tibial, peroneal and gastrocnemius veins when visible. The superficial great saphenous vein was also interrogated. Spectral Doppler was utilized to evaluate flow at rest and with distal augmentation maneuvers in the common femoral, femoral and popliteal veins. COMPARISON:  None Available. FINDINGS: Contralateral Common Femoral Vein: Respiratory phasicity is normal and symmetric with the symptomatic side. No evidence of thrombus. Normal compressibility. Common Femoral Vein: No evidence of thrombus. Normal compressibility, respiratory phasicity and response to augmentation. Saphenofemoral Junction: No evidence of thrombus. Normal compressibility and flow on color Doppler imaging. Profunda Femoral Vein: No evidence of thrombus. Normal compressibility and flow on color Doppler imaging. Femoral Vein:  No evidence of thrombus. Normal compressibility, respiratory phasicity and response to augmentation. Popliteal Vein: No evidence of thrombus. Normal compressibility, respiratory phasicity and response to augmentation. Calf Veins: No evidence of thrombus. Normal compressibility and flow on color Doppler imaging. Superficial Great Saphenous Vein: No evidence of thrombus. Normal compressibility. Venous Reflux:  None. Other Findings: No evidence of superficial thrombophlebitis or abnormal fluid collection.  IMPRESSION: No evidence of right lower extremity deep venous thrombosis. Electronically Signed   By: Irish Lack M.D.   On: 02/14/2023 15:40

## 2023-02-16 NOTE — Progress Notes (Signed)
PROGRESS NOTE    Lisa [name]  ZOX:[id number] DOB: 1946/02/09 DOA: 02/14/2023 PCP: Kirstie Peri, MD  Chief Complaint  Patient presents with   Leg Pain    Brief Narrative:   77 y.o. female with hx of recent hospitalization in 11/'24 treated for left renal infarct and left renal artery thromboembolism, started on anticoagulation with Eliquis, CAD with history PCI, PAD, HFpEF, COPD, current smoker half pack Anhar Mcdermott day, who presented with painful right foot.     Found to have imaging concerning for occlusion of distal right SFA.  Admitted to hospital with vascular following.  Oncology consulting given acute leukemia.   Assessment & Plan:   Principal Problem:   Arterial occlusion, lower extremity (HCC) Active Problems:   Hyperleukocytosis   Unstable angina (HCC)   GERD (gastroesophageal reflux disease)   COPD (chronic obstructive pulmonary disease) (HCC)   Tobacco abuse   Coronary artery disease involving native coronary artery of native heart with unstable angina pectoris (HCC)   Hyperlipidemia LDL goal <70   Glaucoma   History of Renal infarction   AML (acute myeloblastic leukemia) (HCC)   Acquired thrombophilia (HCC)  Acute Myeloblastic Leukemia Leukocytosis Thrombocytopenia Appreciate oncology recommendations - they discussed transfer to [facility] center, but decision made to stay here.  Planning for bone marrow aspirate and biopsy.  She's been started on hydroxyurea. IR for port/bone marrow biopsy 12/20  Occlusion of distal right superficial femoral artery with reconstitution of popliteal artery Acute Limb [place]cehm[facility] Vascular planning for R SFA intervention Monday tentatively Heparin gtt  Hx L renal infarct and L renal artery thromboembolism On eliquis prior to admission  CAD with hx PCI Eliquis, not on antiplatelet or statin it appears (hx statin intolerance)  HFpEF Appears euvolemic Lasix on hold  COPD Chronic smoker, recently half  PPD  GERD PPI  Hypertension Metoprolol  Anxiety Valium prn     DVT prophylaxis: heparin gtt Code Status: full Family Communication: none Disposition:   Status is: Inpatient Remains inpatient appropriate because: need for continued inpatient care   Consultants:  Oncology Vascular IR palliative  Procedures:  none  Antimicrobials:  Anti-infectives (From admission, onward)    None       Subjective: C/o pain unrelieved by oxy  Objective: Vitals:   02/16/23 0008 02/16/23 0500 02/16/23 0809 02/16/23 1241  BP: (!) 115/54  124/63 100/67  Pulse: 76  77 70  Resp: 20 13 16 20   Temp: 97.6 F (36.4 C)  98.2 F (36.8 C) 98.2 F (36.8 C)  TempSrc: Oral  Oral Oral  SpO2: 95%  94% 93%  Weight:  83.9 kg    Height:        Intake/Output Summary (Last 24 hours) at 02/16/2023 1627 Last data filed at 02/16/2023 0452 Gross per 24 hour  Intake 435.65 ml  Output --  Net 435.65 ml   Filed Weights   02/14/23 1150 02/16/23 0500  Weight: 78 kg 83.9 kg    Examination:  General exam: Appears calm and comfortable  Respiratory system: unlabored, CTAB Cardiovascular system: RRR Gastrointestinal system: Abdomen is nondistended, soft and nontender. Central nervous system: Alert and oriented. No focal neurological deficits. Extremities: difficult to appreciate RLE pulses, discolored (blue toes)   Data Reviewed: I have personally reviewed following labs and imaging studies  CBC: Recent Labs  Lab 02/14/23 1413 02/14/23 2041 02/15/23 0605 02/15/23 1534 02/16/23 0324  WBC 55.9* 63.0* 60.6* 49.0* 56.9*  NEUTROABS 6.1 10.8* 7.9* 25.0* 29.8*  HGB 11.7* 11.0* 9.9* 10.3* 9.5*  HCT 37.1 34.6* 31.2* 32.6* 30.7*  MCV 98.4 96.9 97.2 96.7 96.8  PLT 57* 71* 57* 57* 53*    Basic Metabolic Panel: Recent Labs  Lab 02/14/23 1413 02/14/23 2041 02/15/23 0605 02/16/23 0324  NA 141  --  138 137  K 2.8*  --  3.9 3.0*  CL 105  --  105 106  CO2 27  --  24 25  GLUCOSE 113*   --  116* 127*  BUN 7*  --  6* 5*  CREATININE 1.05*  --  0.95 1.07*  CALCIUM 8.7*  --  7.9* 7.7*  MG  --  1.7 1.6* 2.2  PHOS  --  2.0* 2.7  --     GFR: Estimated Creatinine Clearance: 47.4 mL/min (Stasia Somero) (by C-G formula based on SCr of 1.07 mg/dL (H)).  Liver Function Tests: Recent Labs  Lab 02/14/23 2041 02/16/23 0324  AST 15 19  ALT 11 12  ALKPHOS 107 101  BILITOT 0.5 0.7  PROT 6.8 5.7*  ALBUMIN 3.2* 2.6*    CBG: No results for input(s): "GLUCAP" in the last 168 hours.   Recent Results (from the past 240 hours)  Culture, blood (Routine X 2) w Reflex to ID Panel     Status: None (Preliminary result)   Collection Time: 02/14/23 11:54 PM   Specimen: BLOOD  Result Value Ref Range Status   Specimen Description BLOOD RIGHT ANTECUBITAL  Final   Special Requests   Final    BOTTLES DRAWN AEROBIC AND ANAEROBIC Blood Culture adequate volume   Culture   Final    NO GROWTH 1 DAY Performed at [facility], [address], [place], [place] [address]    Report Status PENDING  Incomplete  Culture, blood (Routine X 2) w Reflex to ID Panel     Status: None (Preliminary result)   Collection Time: 02/14/23 11:54 PM   Specimen: BLOOD RIGHT HAND  Result Value Ref Range Status   Specimen Description   Final    BLOOD RIGHT HAND Performed at [facility], [address] [place], [place] [address]    Special Requests   Final    BOTTLES DRAWN AEROBIC AND ANAEROBIC Blood Culture adequate volume   Culture  Setup Time PENDING  Incomplete   Culture   Final    NO GROWTH 1 DAY Performed at [facility], [address] [place], [place] [address]    Report Status PENDING  Incomplete  Respiratory (~20 pathogens) panel by PCR     Status: None   Collection Time: 02/15/23 12:10 PM   Specimen: Nasopharyngeal Swab; Respiratory  Result Value Ref Range Status   Adenovirus NOT DETECTED NOT DETECTED Final   Coronavirus 229E NOT DETECTED NOT DETECTED Final    Comment: (NOTE) The Coronavirus on  the Respiratory Panel, DOES NOT test for the novel  Coronavirus (2019 nCoV)    Coronavirus HKU1 NOT DETECTED NOT DETECTED Final   Coronavirus NL63 NOT DETECTED NOT DETECTED Final   Coronavirus OC43 NOT DETECTED NOT DETECTED Final   Metapneumovirus NOT DETECTED NOT DETECTED Final   Rhinovirus / Enterovirus NOT DETECTED NOT DETECTED Final   Influenza Xhaiden Coombs NOT DETECTED NOT DETECTED Final   Influenza B NOT DETECTED NOT DETECTED Final   Parainfluenza Virus 1 NOT DETECTED NOT DETECTED Final   Parainfluenza Virus 2 NOT DETECTED NOT DETECTED Final   Parainfluenza Virus 3 NOT DETECTED NOT DETECTED Final   Parainfluenza Virus 4 NOT DETECTED NOT DETECTED Final   Respiratory Syncytial  Virus NOT DETECTED NOT DETECTED Final   Bordetella pertussis NOT DETECTED NOT DETECTED Final   Bordetella Parapertussis NOT DETECTED NOT DETECTED Final   Chlamydophila pneumoniae NOT DETECTED NOT DETECTED Final   Mycoplasma pneumoniae NOT DETECTED NOT DETECTED Final    Comment: Performed at [facility], [address], [place], [place] [address]         Radiology Studies: CT Angio Chest PE W and/or Wo Contrast Result Date: 02/14/2023 CLINICAL DATA:  Right leg pain for 1 week that has gotten progressively worse. Lasix not decreasing swelling. PE suspected. EXAM: CT ANGIOGRAPHY CHEST WITH CONTRAST CT ANGIOGRAPHY OF ABDOMINAL AORTA WITH ILIOFEMORAL RUNOFF TECHNIQUE: Multidetector CT imaging of the abdomen, pelvis and lower extremities was performed using the standard protocol during bolus administration of intravenous contrast. Multiplanar CT image reconstructions and MIPs were obtained to evaluate the vascular anatomy. Multidetector CT imaging of the chest was performed using the standard protocol during bolus administration of intravenous contrast. Multiplanar CT image reconstructions and MIPs were obtained to evaluate the vascular anatomy. RADIATION DOSE REDUCTION: This exam was performed according to the  departmental dose-optimization program which includes automated exposure control, adjustment of the mA and/or kV according to patient size and/or use of iterative reconstruction technique. CONTRAST:  OMNIPAQUE IOHEXOL 350 MG/ML SOLN COMPARISON:  Same day chest radiograph; CT abdomen and pelvis 01/13/2023 and CT chest 12/30/2022 FINDINGS: Cardiovascular: No pericardial effusion. No pulmonary embolism. Normal caliber aorta. Aortic atherosclerosis. Mediastinum/Nodes: Trachea and esophagus are unremarkable. New mediastinal and right hilar lymphadenopathy. For example 1.0 cm pretracheal node on series 1/image 33 and 1.0 cm right hilar node on series 1/image 41. Lungs/Pleura: Emphysema. Diffuse interlobular septal thickening. There are new bilateral patchy ground-glass opacities in the dependent lungs bilaterally. No pleural effusion or pneumothorax. Musculoskeletal: No acute fracture. Review of the MIP images confirms the above findings. VASCULAR Aorta: No aneurysm or dissection. Scattered mixed density atherosclerotic plaque without hemodynamically significant stenosis. Celiac: Severe narrowing at the origin of the celiac axis. SMA: Patent. Renals: Patent left renal artery. Moderate narrowing at the origin of the right renal artery. IMA: Patent. RIGHT Lower Extremity Inflow: Scattered atherosclerotic plaque without hemodynamically significant narrowing. No aneurysm or dissection. Outflow: Occlusion of the distal superficial femoral artery with reconstitution of the popliteal artery. Runoff: Three-vessel runoff of the right lower extremity. The distal anterior tibial and peroneal arteries are atretic. Patent dorsalis pedis. LEFT Lower Extremity Inflow: Scattered atherosclerotic plaque without hemodynamically significant narrowing. No aneurysm or dissection. Outflow: Patent.  No aneurysm or dissection. Runoff: Patent three vessel runoff to the ankle. Patent dorsalis pedis. Veins: No obvious venous abnormality within  the limitations of this arterial phase study. Review of the MIP images confirms the above findings. NON-VASCULAR Hepatobiliary: No acute abnormality. Pancreas: Unremarkable. Spleen: Unremarkable. Adrenals/Urinary Tract: Normal adrenal glands. Geographic hypoattenuation in the anterior left kidney is unchanged from 01/13/2023 and compatible with infarct. No urinary calculi or hydronephrosis. Unremarkable bladder. Stomach/Bowel: Normal caliber large and small bowel. Colonic diverticulosis without diverticulitis. Appendix and stomach are within normal limits. Lymphatic: No lymphadenopathy. Reproductive: Hysterectomy. Other: No free intraperitoneal fluid or air. Musculoskeletal: No acute fracture. IMPRESSION: 1. Occlusion of the distal right superficial femoral artery with reconstitution of the popliteal artery. 2. No evidence of pulmonary embolism. 3. New bilateral patchy ground-glass opacities in the dependent lungs bilaterally, likely infectious/inflammatory. 4. New mediastinal and right hilar lymphadenopathy, favored reactive. 5. Unchanged left renal infarct. Aortic Atherosclerosis (ICD10-I70.0) and Emphysema (ICD10-J43.9). Critical Value/emergent results were called by  telephone at the time of interpretation on 02/14/2023 at 6:50 pm to provider Dr. Wyn Forster, who verbally acknowledged these results. Electronically Signed   By: Minerva Fester M.D.   On: 02/14/2023 18:51   CT Angio Aortobifemoral W and/or Wo Contrast Result Date: 02/14/2023 CLINICAL DATA:  Right leg pain for 1 week that has gotten progressively worse. Lasix not decreasing swelling. PE suspected. EXAM: CT ANGIOGRAPHY CHEST WITH CONTRAST CT ANGIOGRAPHY OF ABDOMINAL AORTA WITH ILIOFEMORAL RUNOFF TECHNIQUE: Multidetector CT imaging of the abdomen, pelvis and lower extremities was performed using the standard protocol during bolus administration of intravenous contrast. Multiplanar CT image reconstructions and MIPs were obtained to evaluate the  vascular anatomy. Multidetector CT imaging of the chest was performed using the standard protocol during bolus administration of intravenous contrast. Multiplanar CT image reconstructions and MIPs were obtained to evaluate the vascular anatomy. RADIATION DOSE REDUCTION: This exam was performed according to the departmental dose-optimization program which includes automated exposure control, adjustment of the mA and/or kV according to patient size and/or use of iterative reconstruction technique. CONTRAST:  OMNIPAQUE IOHEXOL 350 MG/ML SOLN COMPARISON:  Same day chest radiograph; CT abdomen and pelvis 01/13/2023 and CT chest 12/30/2022 FINDINGS: Cardiovascular: No pericardial effusion. No pulmonary embolism. Normal caliber aorta. Aortic atherosclerosis. Mediastinum/Nodes: Trachea and esophagus are unremarkable. New mediastinal and right hilar lymphadenopathy. For example 1.0 cm pretracheal node on series 1/image 33 and 1.0 cm right hilar node on series 1/image 41. Lungs/Pleura: Emphysema. Diffuse interlobular septal thickening. There are new bilateral patchy ground-glass opacities in the dependent lungs bilaterally. No pleural effusion or pneumothorax. Musculoskeletal: No acute fracture. Review of the MIP images confirms the above findings. VASCULAR Aorta: No aneurysm or dissection. Scattered mixed density atherosclerotic plaque without hemodynamically significant stenosis. Celiac: Severe narrowing at the origin of the celiac axis. SMA: Patent. Renals: Patent left renal artery. Moderate narrowing at the origin of the right renal artery. IMA: Patent. RIGHT Lower Extremity Inflow: Scattered atherosclerotic plaque without hemodynamically significant narrowing. No aneurysm or dissection. Outflow: Occlusion of the distal superficial femoral artery with reconstitution of the popliteal artery. Runoff: Three-vessel runoff of the right lower extremity. The distal anterior tibial and peroneal arteries are atretic. Patent  dorsalis pedis. LEFT Lower Extremity Inflow: Scattered atherosclerotic plaque without hemodynamically significant narrowing. No aneurysm or dissection. Outflow: Patent.  No aneurysm or dissection. Runoff: Patent three vessel runoff to the ankle. Patent dorsalis pedis. Veins: No obvious venous abnormality within the limitations of this arterial phase study. Review of the MIP images confirms the above findings. NON-VASCULAR Hepatobiliary: No acute abnormality. Pancreas: Unremarkable. Spleen: Unremarkable. Adrenals/Urinary Tract: Normal adrenal glands. Geographic hypoattenuation in the anterior left kidney is unchanged from 01/13/2023 and compatible with infarct. No urinary calculi or hydronephrosis. Unremarkable bladder. Stomach/Bowel: Normal caliber large and small bowel. Colonic diverticulosis without diverticulitis. Appendix and stomach are within normal limits. Lymphatic: No lymphadenopathy. Reproductive: Hysterectomy. Other: No free intraperitoneal fluid or air. Musculoskeletal: No acute fracture. IMPRESSION: 1. Occlusion of the distal right superficial femoral artery with reconstitution of the popliteal artery. 2. No evidence of pulmonary embolism. 3. New bilateral patchy ground-glass opacities in the dependent lungs bilaterally, likely infectious/inflammatory. 4. New mediastinal and right hilar lymphadenopathy, favored reactive. 5. Unchanged left renal infarct. Aortic Atherosclerosis (ICD10-I70.0) and Emphysema (ICD10-J43.9). Critical Value/emergent results were called by telephone at the time of interpretation on 02/14/2023 at 6:50 pm to provider Dr. Wyn Forster, who verbally acknowledged these results. Electronically Signed   By: Minerva Fester M.D.   On: 02/14/2023 18:51  Scheduled Meds:  acetaminophen  1,000 mg Oral Q8H   allopurinol  300 mg Oral Daily   hydroxyurea  1,500 mg Oral Daily   metoprolol succinate  25 mg Oral Daily   pantoprazole  40 mg Oral Daily   potassium chloride  40 mEq  Oral BID   sodium chloride flush  3 mL Intravenous Q12H   Continuous Infusions:  sodium chloride 150 mL/hr at 02/16/23 1517   heparin 1,400 Units/hr (02/16/23 0803)     LOS: 2 days    Time spent: over 30 min    Lacretia Nicks, MD [facility]   To contact the attending provider between 7A-7P or the covering provider during after hours 7P-7A, please log into the web site [email or web address] and access using universal [place] password for that web site. If you do not have the password, please call the hospital operator.  02/16/2023, 4:27 PM

## 2023-02-17 ENCOUNTER — Inpatient Hospital Stay (HOSPITAL_COMMUNITY): Payer: Medicare Other

## 2023-02-17 DIAGNOSIS — Z515 Encounter for palliative care: Secondary | ICD-10-CM | POA: Diagnosis not present

## 2023-02-17 DIAGNOSIS — Z7189 Other specified counseling: Secondary | ICD-10-CM

## 2023-02-17 DIAGNOSIS — I70209 Unspecified atherosclerosis of native arteries of extremities, unspecified extremity: Secondary | ICD-10-CM | POA: Diagnosis not present

## 2023-02-17 HISTORY — PX: IR IMAGING GUIDED PORT INSERTION: IMG5740

## 2023-02-17 HISTORY — PX: IR BONE MARROW BIOPSY & ASPIRATION: IMG5727

## 2023-02-17 LAB — CBC WITH DIFFERENTIAL/PLATELET
Abs Immature Granulocytes: 0.08 10*3/uL — ABNORMAL HIGH (ref 0.00–0.07)
Basophils Absolute: 0.1 10*3/uL (ref 0.0–0.1)
Basophils Relative: 0 %
Eosinophils Absolute: 0.2 10*3/uL (ref 0.0–0.5)
Eosinophils Relative: 0 %
HCT: 32.3 % — ABNORMAL LOW (ref 36.0–46.0)
Hemoglobin: 10.1 g/dL — ABNORMAL LOW (ref 12.0–15.0)
Immature Granulocytes: 0 %
Lymphocytes Relative: 12 %
Lymphs Abs: 10.5 10*3/uL — ABNORMAL HIGH (ref 0.7–4.0)
MCH: 30.4 pg (ref 26.0–34.0)
MCHC: 31.3 g/dL (ref 30.0–36.0)
MCV: 97.3 fL (ref 80.0–100.0)
Monocytes Absolute: 22.1 10*3/uL — ABNORMAL HIGH (ref 0.1–1.0)
Monocytes Relative: 26 %
Neutro Abs: 51.1 10*3/uL — ABNORMAL HIGH (ref 1.7–7.7)
Neutrophils Relative %: 62 %
Platelets: 54 10*3/uL — ABNORMAL LOW (ref 150–400)
RBC: 3.32 MIL/uL — ABNORMAL LOW (ref 3.87–5.11)
RDW: 20.3 % — ABNORMAL HIGH (ref 11.5–15.5)
WBC: 84.1 10*3/uL (ref 4.0–10.5)
nRBC: 0.2 % (ref 0.0–0.2)

## 2023-02-17 LAB — APTT
aPTT: 68 s — ABNORMAL HIGH (ref 24–36)
aPTT: 70 s — ABNORMAL HIGH (ref 24–36)

## 2023-02-17 LAB — COMPREHENSIVE METABOLIC PANEL
ALT: 15 U/L (ref 0–44)
AST: 19 U/L (ref 15–41)
Albumin: 2.6 g/dL — ABNORMAL LOW (ref 3.5–5.0)
Alkaline Phosphatase: 114 U/L (ref 38–126)
Anion gap: 7 (ref 5–15)
BUN: 9 mg/dL (ref 8–23)
CO2: 23 mmol/L (ref 22–32)
Calcium: 8.1 mg/dL — ABNORMAL LOW (ref 8.9–10.3)
Chloride: 110 mmol/L (ref 98–111)
Creatinine, Ser: 1.25 mg/dL — ABNORMAL HIGH (ref 0.44–1.00)
GFR, Estimated: 44 mL/min — ABNORMAL LOW (ref >60)
Glucose, Bld: 119 mg/dL — ABNORMAL HIGH (ref 70–99)
Potassium: 4.3 mmol/L (ref 3.5–5.1)
Sodium: 140 mmol/L (ref 135–145)
Total Bilirubin: 0.8 mg/dL (ref <1.2)
Total Protein: 6.3 g/dL — ABNORMAL LOW (ref 6.5–8.1)

## 2023-02-17 LAB — LUPUS ANTICOAGULANT PANEL
DRVVT: 81.1 s — ABNORMAL HIGH (ref 0.0–47.0)
PTT Lupus Anticoagulant: 38.8 s (ref 0.0–43.5)

## 2023-02-17 LAB — DRVVT CONFIRM: dRVVT Confirm: 0.9 {ratio} (ref 0.8–1.2)

## 2023-02-17 LAB — HEPARIN LEVEL (UNFRACTIONATED)
Heparin Unfractionated: 0.51 [IU]/mL (ref 0.30–0.70)
Heparin Unfractionated: 0.37 [IU]/mL (ref 0.30–0.70)

## 2023-02-17 LAB — FLOW CYTOMETRY

## 2023-02-17 LAB — URIC ACID: Uric Acid, Serum: 3.9 mg/dL (ref 2.5–7.1)

## 2023-02-17 LAB — DRVVT MIX: dRVVT Mix: 47.7 s — ABNORMAL HIGH (ref 0.0–40.4)

## 2023-02-17 MED ORDER — FENTANYL CITRATE (PF) 100 MCG/2ML IJ SOLN
INTRAMUSCULAR | Status: AC
  Filled 2023-02-17: qty 4

## 2023-02-17 MED ORDER — MIDAZOLAM HCL 2 MG/2ML IJ SOLN
INTRAMUSCULAR | Status: AC
Start: 2023-02-17 — End: ?
  Filled 2023-02-17: qty 4

## 2023-02-17 MED ORDER — CHLORHEXIDINE GLUCONATE CLOTH 2 % EX PADS
6.0000 | MEDICATED_PAD | Freq: Every day | CUTANEOUS | Status: DC
  Administered 2023-02-17 – 2023-02-21 (×5): 6 via TOPICAL

## 2023-02-17 MED ORDER — LACTATED RINGERS IV BOLUS
1000.0000 mL | Freq: Once | INTRAVENOUS | Status: AC
  Administered 2023-02-17: 1000 mL via INTRAVENOUS

## 2023-02-17 MED ORDER — LIDOCAINE-PRILOCAINE 2.5-2.5 % EX CREA
TOPICAL_CREAM | Freq: Once | CUTANEOUS | Status: AC
Start: 2023-02-17 — End: 2023-02-17
  Filled 2023-02-17: qty 5

## 2023-02-17 MED ORDER — SODIUM CHLORIDE 0.9 % IV SOLN: INTRAVENOUS | Status: AC

## 2023-02-17 MED ORDER — HEPARIN SOD (PORK) LOCK FLUSH 100 UNIT/ML IV SOLN
INTRAVENOUS | Status: AC
  Filled 2023-02-17: qty 5

## 2023-02-17 MED ORDER — HEPARIN (PORCINE) 25000 UT/250ML-% IV SOLN
1550.0000 [IU]/h | INTRAVENOUS | Status: DC
  Administered 2023-02-17 – 2023-02-18 (×3): 1400 [IU]/h via INTRAVENOUS
  Administered 2023-02-19: 1450 [IU]/h via INTRAVENOUS
  Administered 2023-02-20 – 2023-02-21 (×3): 1550 [IU]/h via INTRAVENOUS
  Filled 2023-02-17 (×6): qty 250

## 2023-02-17 MED ORDER — HYDROXYUREA 500 MG PO CAPS
2000.0000 mg | ORAL_CAPSULE | Freq: Every day | ORAL | Status: DC
  Administered 2023-02-17 – 2023-02-20 (×4): 2000 mg via ORAL
  Filled 2023-02-17 (×5): qty 4

## 2023-02-17 MED ORDER — LIDOCAINE-EPINEPHRINE 1 %-1:100000 IJ SOLN
INTRAMUSCULAR | Status: AC
  Filled 2023-02-17: qty 1

## 2023-02-17 MED ORDER — ONDANSETRON HCL 4 MG/2ML IJ SOLN: 4.0000 mg | Freq: Four times a day (QID) | INTRAMUSCULAR | Status: DC | PRN

## 2023-02-17 MED ORDER — MIDAZOLAM HCL 2 MG/2ML IJ SOLN
INTRAMUSCULAR | Status: AC | PRN
  Administered 2023-02-17 (×2): .5 mg via INTRAVENOUS
  Administered 2023-02-17 (×2): 1 mg via INTRAVENOUS
  Administered 2023-02-17: .5 mg via INTRAVENOUS

## 2023-02-17 MED ORDER — FENTANYL CITRATE (PF) 100 MCG/2ML IJ SOLN
INTRAMUSCULAR | Status: AC | PRN
  Administered 2023-02-17: 25 ug via INTRAVENOUS
  Administered 2023-02-17: 50 ug via INTRAVENOUS
  Administered 2023-02-17 (×3): 25 ug via INTRAVENOUS

## 2023-02-17 MED ORDER — TRAMADOL HCL 50 MG PO TABS
50.0000 mg | ORAL_TABLET | Freq: Four times a day (QID) | ORAL | Status: DC | PRN
  Administered 2023-02-17 – 2023-02-21 (×9): 50 mg via ORAL
  Filled 2023-02-17 (×10): qty 1

## 2023-02-17 NOTE — Progress Notes (Signed)
Aeryal Michon   DOB:04-Sep-1945   ZO#:[id number]    ASSESSMENT & PLAN:  #1 acute leukemia I have a long discussion with the patient, her sister and her daughter who are by her bedside on 12/18.  Overall, her presentation is consistent with diagnosis of acute myelogenous leukemia.  Subtype is unknown.  We discussed risk and benefits of transferring her to tertiary center versus keeping her here.  The patient is made aware that in general, we are not equipped to treat patients aggressively for acute leukemia.  The patient declined transfer.  She agrees to proceed with bone marrow aspirate and biopsy, performed today Potentially, she is interested in systemic chemotherapy such as hypomethylating agents if that could be offered locally.  We discussed risk and benefits of treating her in [place] versus at [facility].  I have reached out to Dr. Ellin Saba today to see if he can treat her in the future at [facility]   We discussed risk and benefits of starting her on hydroxyurea and she is in agreement.  She was started on hydroxyurea at 1500 mg daily on December 18 reduce her white blood cell count.  She is aware that she could be more pancytopenic moving forward.  Due to lack of response, I plan to increase it to 2000 mg daily   We discussed risk and benefits of port placement and she agreed to proceed.  She had port placed today on December 20   I recommend aggressive IV fluid hydration overnight to improve her blood flow.  This would be at slight risk of more leg swelling but could reduce her risk of lower limb ischemia.  I will also start her on allopurinol for tumor lysis prophylaxis  She does not need platelet transfusion or blood transfusion right now   #2 Critical limb ischemia I suspect it could be precipitated by presence of blasts causing circulation issues.  She will continue medical management with IV heparin.  As above, I think it is important that we try to side to reduce her  with Hydrea and to increase IV fluids.  The patient is not capable of drinking more than 60 ounces of liquid per day If she needs urgent surgery, she can benefit from preoperative platelet transfusion 1 hour before surgery, followed by intraoperative platelet transfusion as well as post operative platelet transfusion.  It is not realistic to expect her platelet count to go up to over 100,000 prior to surgery If she cannot have surgery here, consider transfer to tertiary center In the meantime, continue pain medication   #3 goals of care discussion The patient is aware of poor prognosis associated with acute leukemia.  She is interested for systemic chemotherapy that could be offered locally.  We discussed importance of advanced directives and living will.  The patient would like her CODE STATUS will be full code.  She appointed her daughter, [name] as her medical healthcare power of attorney in the event that she is not able to make medical decisions for herself We discussed prognosis with or without treatment  Discharge planning She is not ready for discharge She is aware that I will be away for vacation starting tomorrow.  She will be seen by on-call physician over the weekend and next week.    All questions were answered. The patient knows to call the clinic with any problems, questions or concerns.   The total time spent in the appointment was 40 minutes encounter with patients including review of chart  and various tests results, discussions about plan of care and coordination of care plan  Artis Delay, MD 02/17/2023 4:02 PM  Subjective:  She is seen in the room.  Her daughter is by the bedside.  She is complaining of pain in her foot that feels better when she stands up.  No bleeding.  We discussed test results, prognosis and future plan of care  Objective:  Vitals:   02/17/23 1338 02/17/23 1512  BP:  112/70  Pulse:    Resp:  20  Temp: 98.9 F (37.2 C) 98.5 F (36.9 C)  SpO2:        Intake/Output Summary (Last 24 hours) at 02/17/2023 5409 Last data filed at 02/17/2023 1500 Gross per 24 hour  Intake 2361.3 ml  Output --  Net 2361.3 ml    GENERAL:alert, in mild distress due to ischemic pain on her right foot SKIN: Noted mottled skin appearance.  Her toes appear cyanotic, slightly worse compared to yesterday's exam NEURO: alert & oriented x 3 with fluent speech, no focal motor/sensory deficits   Labs:  Recent Labs    12/30/22 1016 12/31/22 0441 02/14/23 2041 02/15/23 0605 02/16/23 0324 02/17/23 0350  NA  --    < >  --  138 137 140  K  --    < >  --  3.9 3.0* 4.3  CL  --    < >  --  105 106 110  CO2  --    < >  --  24 25 23   GLUCOSE  --    < >  --  116* 127* 119*  BUN  --    < >  --  6* 5* 9  CREATININE  --    < >  --  0.95 1.07* 1.25*  CALCIUM  --    < >  --  7.9* 7.7* 8.1*  GFRNONAA  --    < >  --  >60 53* 44*  PROT 6.4*   < > 6.8  --  5.7* 6.3*  ALBUMIN 2.8*   < > 3.2*  --  2.6* 2.6*  AST 48*   < > 15  --  19 19  ALT 80*   < > 11  --  12 15  ALKPHOS 87   < > 107  --  101 114  BILITOT 1.2   < > 0.5  --  0.7 0.8  BILIDIR 0.4*  --  0.1  --   --   --   IBILI 0.8  --  0.4  --   --   --    < > = values in this interval not displayed.    Studies:  DG CHEST PORT 1 VIEW Result Date: 02/17/2023 CLINICAL DATA:  Shortness of breath EXAM: PORTABLE CHEST 1 VIEW COMPARISON:  02/14/2023 FINDINGS: Right-sided central venous port tip over the SVC. Stable cardiomediastinal silhouette. Emphysema. No acute airspace disease, pleural effusion or pneumothorax. IMPRESSION: No active disease. Emphysema. Electronically Signed   By: Jasmine Pang M.D.   On: 02/17/2023 15:29   IR IMAGING GUIDED PORT INSERTION Result Date: 02/17/2023 INDICATION: 77 year old female for a new diagnosis of leukocytosis concerning for leukemia. She presents for combined bone marrow biopsy and port catheter placement. EXAM: CT GUIDED BONE MARROW ASPIRATION AND CORE BIOPSY IR PORT PLACEMENT  Interventional Radiologist:  Sterling Big, MD MEDICATIONS: None. ANESTHESIA/SEDATION: Moderate (conscious) sedation was employed during this procedure. A total of 3.5 milligrams versed and 150 micrograms fentanyl were  administered intravenously by the Radiology nurse. The patient's level of consciousness and vital signs were monitored continuously by radiology nursing throughout the procedure under my direct supervision. Total monitored sedation time: 60 minutes FLUOROSCOPY: Radiation exposure index: 11.8 mGy reference air kerma COMPLICATIONS: None immediate. Estimated blood loss: <25 mL PROCEDURE: Informed written consent was obtained from the patient after a thorough discussion of the procedural risks, benefits and alternatives. All questions were addressed. Maximal Sterile Barrier Technique was utilized including caps, mask, sterile gowns, sterile gloves, sterile drape, hand hygiene and skin antiseptic. A timeout was performed prior to the initiation of the procedure. BONE MARROW BIOPSY The patient was positioned prone and fluoroscopy was performed of the pelvis to demonstrate the iliac marrow spaces. Maximal barrier sterile technique utilized including caps, mask, sterile gowns, sterile gloves, large sterile drape, hand hygiene, and betadine prep. Under sterile conditions and local anesthesia, an 11 gauge coaxial bone biopsy needle was advanced into the left iliac marrow space. Needle position was confirmed with CT imaging. Initially, bone marrow aspiration was performed. Next, the 11 gauge outer cannula was utilized to obtain a left iliac bone marrow core biopsy. Needle was removed. Hemostasis was obtained with compression. The patient tolerated the procedure well. Samples were prepared with the cytotechnologist. PORT PLACEMENT The patient was repositioned into the supine position. The right neck and chest were sterilely prepped in the standard fashion using chlorhexidine skin prep. Ultrasound  demonstrated patency of the right internal jugular vein, and this was documented with an image. Under real-time ultrasound guidance, this vein was accessed with a 21 gauge micropuncture needle and image documentation was performed. A small dermatotomy was made at the access site with an 11 scalpel. A 0.018" wire was advanced into the SVC and the access needle exchanged for a 86F micropuncture vascular sheath. The 0.018" wire was then removed and a 0.035" wire advanced into the IVC. An appropriate location for the subcutaneous reservoir was selected below the clavicle and an incision was made through the skin and underlying soft tissues. The subcutaneous tissues were then dissected using a combination of blunt and sharp surgical technique and a pocket was formed. A single lumen power injectable portacatheter (Bard Clear Vue) was then tunneled through the subcutaneous tissues from the pocket to the dermatotomy and the port reservoir placed within the subcutaneous pocket. The venous access site was then serially dilated and a peel away vascular sheath placed over the wire. The wire was removed and the port catheter advanced into position under fluoroscopic guidance. The catheter tip is positioned in the superior cavoatrial junction. This was documented with a spot image. The portacatheter was then tested and found to flush and aspirate well. The port was flushed with saline followed by 100 units/mL heparinized saline. The pocket was then closed in two layers using first subdermal inverted interrupted absorbable sutures followed by a running subcuticular suture. The epidermis was then sealed with Dermabond. The dermatotomy at the venous access site was also closed with Dermabond. IMPRESSION: 1. Successful left iliac bone marrow aspirate and core biopsy 2. Successful right IJ approach port catheter placement (Bard Clear Vue). The catheter tubing is at the superior cavoatrial junction and the catheter is ready for  immediate use. Electronically Signed   By: Malachy Moan M.D.   On: 02/17/2023 13:13   IR BONE MARROW BIOPSY & ASPIRATION Result Date: 02/17/2023 INDICATION: 77 year old female for a new diagnosis of leukocytosis concerning for leukemia. She presents for combined bone marrow biopsy and port catheter  placement. EXAM: CT GUIDED BONE MARROW ASPIRATION AND CORE BIOPSY IR PORT PLACEMENT Interventional Radiologist:  Sterling Big, MD MEDICATIONS: None. ANESTHESIA/SEDATION: Moderate (conscious) sedation was employed during this procedure. A total of 3.5 milligrams versed and 150 micrograms fentanyl were administered intravenously by the Radiology nurse. The patient's level of consciousness and vital signs were monitored continuously by radiology nursing throughout the procedure under my direct supervision. Total monitored sedation time: 60 minutes FLUOROSCOPY: Radiation exposure index: 11.8 mGy reference air kerma COMPLICATIONS: None immediate. Estimated blood loss: <25 mL PROCEDURE: Informed written consent was obtained from the patient after a thorough discussion of the procedural risks, benefits and alternatives. All questions were addressed. Maximal Sterile Barrier Technique was utilized including caps, mask, sterile gowns, sterile gloves, sterile drape, hand hygiene and skin antiseptic. A timeout was performed prior to the initiation of the procedure. BONE MARROW BIOPSY The patient was positioned prone and fluoroscopy was performed of the pelvis to demonstrate the iliac marrow spaces. Maximal barrier sterile technique utilized including caps, mask, sterile gowns, sterile gloves, large sterile drape, hand hygiene, and betadine prep. Under sterile conditions and local anesthesia, an 11 gauge coaxial bone biopsy needle was advanced into the left iliac marrow space. Needle position was confirmed with CT imaging. Initially, bone marrow aspiration was performed. Next, the 11 gauge outer cannula was utilized to  obtain a left iliac bone marrow core biopsy. Needle was removed. Hemostasis was obtained with compression. The patient tolerated the procedure well. Samples were prepared with the cytotechnologist. PORT PLACEMENT The patient was repositioned into the supine position. The right neck and chest were sterilely prepped in the standard fashion using chlorhexidine skin prep. Ultrasound demonstrated patency of the right internal jugular vein, and this was documented with an image. Under real-time ultrasound guidance, this vein was accessed with a 21 gauge micropuncture needle and image documentation was performed. A small dermatotomy was made at the access site with an 11 scalpel. A 0.018" wire was advanced into the SVC and the access needle exchanged for a 55F micropuncture vascular sheath. The 0.018" wire was then removed and a 0.035" wire advanced into the IVC. An appropriate location for the subcutaneous reservoir was selected below the clavicle and an incision was made through the skin and underlying soft tissues. The subcutaneous tissues were then dissected using a combination of blunt and sharp surgical technique and a pocket was formed. A single lumen power injectable portacatheter (Bard Clear Vue) was then tunneled through the subcutaneous tissues from the pocket to the dermatotomy and the port reservoir placed within the subcutaneous pocket. The venous access site was then serially dilated and a peel away vascular sheath placed over the wire. The wire was removed and the port catheter advanced into position under fluoroscopic guidance. The catheter tip is positioned in the superior cavoatrial junction. This was documented with a spot image. The portacatheter was then tested and found to flush and aspirate well. The port was flushed with saline followed by 100 units/mL heparinized saline. The pocket was then closed in two layers using first subdermal inverted interrupted absorbable sutures followed by a running  subcuticular suture. The epidermis was then sealed with Dermabond. The dermatotomy at the venous access site was also closed with Dermabond. IMPRESSION: 1. Successful left iliac bone marrow aspirate and core biopsy 2. Successful right IJ approach port catheter placement (Bard Clear Vue). The catheter tubing is at the superior cavoatrial junction and the catheter is ready for immediate use. Electronically Signed   By:  Malachy Moan M.D.   On: 02/17/2023 13:13   CT Angio Chest PE W and/or Wo Contrast Result Date: 02/14/2023 CLINICAL DATA:  Right leg pain for 1 week that has gotten progressively worse. Lasix not decreasing swelling. PE suspected. EXAM: CT ANGIOGRAPHY CHEST WITH CONTRAST CT ANGIOGRAPHY OF ABDOMINAL AORTA WITH ILIOFEMORAL RUNOFF TECHNIQUE: Multidetector CT imaging of the abdomen, pelvis and lower extremities was performed using the standard protocol during bolus administration of intravenous contrast. Multiplanar CT image reconstructions and MIPs were obtained to evaluate the vascular anatomy. Multidetector CT imaging of the chest was performed using the standard protocol during bolus administration of intravenous contrast. Multiplanar CT image reconstructions and MIPs were obtained to evaluate the vascular anatomy. RADIATION DOSE REDUCTION: This exam was performed according to the departmental dose-optimization program which includes automated exposure control, adjustment of the mA and/or kV according to patient size and/or use of iterative reconstruction technique. CONTRAST:  OMNIPAQUE IOHEXOL 350 MG/ML SOLN COMPARISON:  Same day chest radiograph; CT abdomen and pelvis 01/13/2023 and CT chest 12/30/2022 FINDINGS: Cardiovascular: No pericardial effusion. No pulmonary embolism. Normal caliber aorta. Aortic atherosclerosis. Mediastinum/Nodes: Trachea and esophagus are unremarkable. New mediastinal and right hilar lymphadenopathy. For example 1.0 cm pretracheal node on series 1/image 33 and  1.0 cm right hilar node on series 1/image 41. Lungs/Pleura: Emphysema. Diffuse interlobular septal thickening. There are new bilateral patchy ground-glass opacities in the dependent lungs bilaterally. No pleural effusion or pneumothorax. Musculoskeletal: No acute fracture. Review of the MIP images confirms the above findings. VASCULAR Aorta: No aneurysm or dissection. Scattered mixed density atherosclerotic plaque without hemodynamically significant stenosis. Celiac: Severe narrowing at the origin of the celiac axis. SMA: Patent. Renals: Patent left renal artery. Moderate narrowing at the origin of the right renal artery. IMA: Patent. RIGHT Lower Extremity Inflow: Scattered atherosclerotic plaque without hemodynamically significant narrowing. No aneurysm or dissection. Outflow: Occlusion of the distal superficial femoral artery with reconstitution of the popliteal artery. Runoff: Three-vessel runoff of the right lower extremity. The distal anterior tibial and peroneal arteries are atretic. Patent dorsalis pedis. LEFT Lower Extremity Inflow: Scattered atherosclerotic plaque without hemodynamically significant narrowing. No aneurysm or dissection. Outflow: Patent.  No aneurysm or dissection. Runoff: Patent three vessel runoff to the ankle. Patent dorsalis pedis. Veins: No obvious venous abnormality within the limitations of this arterial phase study. Review of the MIP images confirms the above findings. NON-VASCULAR Hepatobiliary: No acute abnormality. Pancreas: Unremarkable. Spleen: Unremarkable. Adrenals/Urinary Tract: Normal adrenal glands. Geographic hypoattenuation in the anterior left kidney is unchanged from 01/13/2023 and compatible with infarct. No urinary calculi or hydronephrosis. Unremarkable bladder. Stomach/Bowel: Normal caliber large and small bowel. Colonic diverticulosis without diverticulitis. Appendix and stomach are within normal limits. Lymphatic: No lymphadenopathy. Reproductive: Hysterectomy.  Other: No free intraperitoneal fluid or air. Musculoskeletal: No acute fracture. IMPRESSION: 1. Occlusion of the distal right superficial femoral artery with reconstitution of the popliteal artery. 2. No evidence of pulmonary embolism. 3. New bilateral patchy ground-glass opacities in the dependent lungs bilaterally, likely infectious/inflammatory. 4. New mediastinal and right hilar lymphadenopathy, favored reactive. 5. Unchanged left renal infarct. Aortic Atherosclerosis (ICD10-I70.0) and Emphysema (ICD10-J43.9). Critical Value/emergent results were called by telephone at the time of interpretation on 02/14/2023 at 6:50 pm to provider Dr. Wyn Forster, who verbally acknowledged these results. Electronically Signed   By: Minerva Fester M.D.   On: 02/14/2023 18:51   CT Angio Aortobifemoral W and/or Wo Contrast Result Date: 02/14/2023 CLINICAL DATA:  Right leg pain for 1 week that has gotten  progressively worse. Lasix not decreasing swelling. PE suspected. EXAM: CT ANGIOGRAPHY CHEST WITH CONTRAST CT ANGIOGRAPHY OF ABDOMINAL AORTA WITH ILIOFEMORAL RUNOFF TECHNIQUE: Multidetector CT imaging of the abdomen, pelvis and lower extremities was performed using the standard protocol during bolus administration of intravenous contrast. Multiplanar CT image reconstructions and MIPs were obtained to evaluate the vascular anatomy. Multidetector CT imaging of the chest was performed using the standard protocol during bolus administration of intravenous contrast. Multiplanar CT image reconstructions and MIPs were obtained to evaluate the vascular anatomy. RADIATION DOSE REDUCTION: This exam was performed according to the departmental dose-optimization program which includes automated exposure control, adjustment of the mA and/or kV according to patient size and/or use of iterative reconstruction technique. CONTRAST:  OMNIPAQUE IOHEXOL 350 MG/ML SOLN COMPARISON:  Same day chest radiograph; CT abdomen and pelvis 01/13/2023 and  CT chest 12/30/2022 FINDINGS: Cardiovascular: No pericardial effusion. No pulmonary embolism. Normal caliber aorta. Aortic atherosclerosis. Mediastinum/Nodes: Trachea and esophagus are unremarkable. New mediastinal and right hilar lymphadenopathy. For example 1.0 cm pretracheal node on series 1/image 33 and 1.0 cm right hilar node on series 1/image 41. Lungs/Pleura: Emphysema. Diffuse interlobular septal thickening. There are new bilateral patchy ground-glass opacities in the dependent lungs bilaterally. No pleural effusion or pneumothorax. Musculoskeletal: No acute fracture. Review of the MIP images confirms the above findings. VASCULAR Aorta: No aneurysm or dissection. Scattered mixed density atherosclerotic plaque without hemodynamically significant stenosis. Celiac: Severe narrowing at the origin of the celiac axis. SMA: Patent. Renals: Patent left renal artery. Moderate narrowing at the origin of the right renal artery. IMA: Patent. RIGHT Lower Extremity Inflow: Scattered atherosclerotic plaque without hemodynamically significant narrowing. No aneurysm or dissection. Outflow: Occlusion of the distal superficial femoral artery with reconstitution of the popliteal artery. Runoff: Three-vessel runoff of the right lower extremity. The distal anterior tibial and peroneal arteries are atretic. Patent dorsalis pedis. LEFT Lower Extremity Inflow: Scattered atherosclerotic plaque without hemodynamically significant narrowing. No aneurysm or dissection. Outflow: Patent.  No aneurysm or dissection. Runoff: Patent three vessel runoff to the ankle. Patent dorsalis pedis. Veins: No obvious venous abnormality within the limitations of this arterial phase study. Review of the MIP images confirms the above findings. NON-VASCULAR Hepatobiliary: No acute abnormality. Pancreas: Unremarkable. Spleen: Unremarkable. Adrenals/Urinary Tract: Normal adrenal glands. Geographic hypoattenuation in the anterior left kidney is unchanged from  01/13/2023 and compatible with infarct. No urinary calculi or hydronephrosis. Unremarkable bladder. Stomach/Bowel: Normal caliber large and small bowel. Colonic diverticulosis without diverticulitis. Appendix and stomach are within normal limits. Lymphatic: No lymphadenopathy. Reproductive: Hysterectomy. Other: No free intraperitoneal fluid or air. Musculoskeletal: No acute fracture. IMPRESSION: 1. Occlusion of the distal right superficial femoral artery with reconstitution of the popliteal artery. 2. No evidence of pulmonary embolism. 3. New bilateral patchy ground-glass opacities in the dependent lungs bilaterally, likely infectious/inflammatory. 4. New mediastinal and right hilar lymphadenopathy, favored reactive. 5. Unchanged left renal infarct. Aortic Atherosclerosis (ICD10-I70.0) and Emphysema (ICD10-J43.9). Critical Value/emergent results were called by telephone at the time of interpretation on 02/14/2023 at 6:50 pm to provider Dr. Wyn Forster, who verbally acknowledged these results. Electronically Signed   By: Minerva Fester M.D.   On: 02/14/2023 18:51   DG Chest Portable 1 View Result Date: 02/14/2023 CLINICAL DATA:  Right leg pain and swelling. EXAM: PORTABLE CHEST 1 VIEW COMPARISON:  12/30/2022. FINDINGS: Bilateral lungs appear hyperlucent with coarse bronchovascular markings, in keeping with COPD. There are atelectatic changes at the left lung base. Bilateral lungs otherwise appear clear. No overt pulmonary edema. No  dense consolidation or lung collapse. Bilateral costophrenic angles are clear. Stable cardio-mediastinal silhouette. No acute osseous abnormalities. The soft tissues are within normal limits. IMPRESSION: *No active disease.  COPD. Electronically Signed   By: Jules Schick M.D.   On: 02/14/2023 15:53   US Venous Img Lower Unilateral Right Result Date: 02/14/2023 CLINICAL DATA:  Right lower extremity pain and edema. EXAM: RIGHT LOWER EXTREMITY VENOUS DOPPLER ULTRASOUND TECHNIQUE:  Gray-scale sonography with graded compression, as well as color Doppler and duplex ultrasound were performed to evaluate the lower extremity deep venous systems from the level of the common femoral vein and including the common femoral, femoral, profunda femoral, popliteal and calf veins including the posterior tibial, peroneal and gastrocnemius veins when visible. The superficial great saphenous vein was also interrogated. Spectral Doppler was utilized to evaluate flow at rest and with distal augmentation maneuvers in the common femoral, femoral and popliteal veins. COMPARISON:  None Available. FINDINGS: Contralateral Common Femoral Vein: Respiratory phasicity is normal and symmetric with the symptomatic side. No evidence of thrombus. Normal compressibility. Common Femoral Vein: No evidence of thrombus. Normal compressibility, respiratory phasicity and response to augmentation. Saphenofemoral Junction: No evidence of thrombus. Normal compressibility and flow on color Doppler imaging. Profunda Femoral Vein: No evidence of thrombus. Normal compressibility and flow on color Doppler imaging. Femoral Vein: No evidence of thrombus. Normal compressibility, respiratory phasicity and response to augmentation. Popliteal Vein: No evidence of thrombus. Normal compressibility, respiratory phasicity and response to augmentation. Calf Veins: No evidence of thrombus. Normal compressibility and flow on color Doppler imaging. Superficial Great Saphenous Vein: No evidence of thrombus. Normal compressibility. Venous Reflux:  None. Other Findings: No evidence of superficial thrombophlebitis or abnormal fluid collection. IMPRESSION: No evidence of right lower extremity deep venous thrombosis. Electronically Signed   By: Irish Lack M.D.   On: 02/14/2023 15:40

## 2023-02-17 NOTE — Consult Note (Signed)
PHARMACY - ANTICOAGULATION CONSULT NOTE  Pharmacy Consult for IV Heparin Indication:  Occlusion superficial femoral artery  Patient Measurements: Height: 5\' 7"  (170.2 cm) Weight: 86.3 kg (190 lb 3.2 oz) IBW/kg (Calculated) : 61.6 Heparin Dosing Weight: 77.3 kg  Labs: Recent Labs    02/14/23 1413 02/14/23 1557 02/14/23 2041 02/14/23 2100 02/15/23 0605 02/15/23 1534 02/15/23 2051 02/16/23 0324 02/17/23 0350  HGB 11.7*  --    < >  --  9.9* 10.3*  --  9.5* 10.1*  HCT 37.1  --    < >  --  31.2* 32.6*  --  30.7* 32.3*  PLT 57*  --    < >  --  57* 57*  --  53* 54*  APTT  --   --    < > 32 63* 63* 83* 83* 68*  LABPROT  --   --   --  18.5*  --   --   --   --   --   INR  --   --   --  1.5*  --   --   --   --   --   HEPARINUNFRC  --   --    < > >1.10* >1.10*  --   --  1.08* 0.51  CREATININE 1.05*  --   --   --  0.95  --   --  1.07* 1.25*  TROPONINIHS 12 12  --   --   --   --   --   --   --    < > = values in this interval not displayed.    Estimated Creatinine Clearance: 41.2 mL/min (A) (by C-G formula based on SCr of 1.25 mg/dL (H)).  Medical History: Past Medical History:  Diagnosis Date   Asthma    COPD (chronic obstructive pulmonary disease) (HCC)    Coronary artery disease    Cough    " ALL MY LIFE "   Dyspnea    GERD (gastroesophageal reflux disease)    History of blood clots    patient had a blood clot in her kidney 01/03/2023.  pt is on Eliquis   Hypertension    NSTEMI (non-ST elevated myocardial infarction) (HCC) 09/01/2016   DES to Cx/OM bifurcation   Unstable angina (HCC) 08/2016    Medications:  Apixaban 5 mg BID prior to admission, last dose unknown  Assessment: 77 y/o F with medical history as above and including history of blood clots on apixaban presenting with right leg pain with swelling. 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 Pharmacy consulted to initiate and manage heparin  infusion for occlusion superficial femoral artery.  Morning heparin level 0.51 and aPTT 68, now correlating and both therapeutic, with heparin infusion at 1400 units/hour. CBC- hgb 10.1 and plts 54. Plts remain in the 50-70s. No infusion issues or bleeding noted per RN.  Shortly after AM heparin level resulted, patient was taken to bone marrow biopsy and port placement procedure where heparin infusion was held. Due to length of time held, will restart at previous rate and check 8 hour levels.  Goal of Therapy:  Heparin level 0.3-0.7 units/ml aPTT 66 - 102 seconds Monitor platelets by anticoagulation protocol: Yes   Plan:  Restart heparin infusion at 1400 units/hr Check aPTT and heparin level in 8 hours Daily aPTT/heparin level, CBC Monitor CBC and signs/symptoms of bleeding  Stephenie Acres, PharmD PGY1 Pharmacy Resident 02/17/2023 10:42 AM

## 2023-02-17 NOTE — Progress Notes (Signed)
Patient on atrial fibrillation with HR ranges from 100s to 130s, 12 leads EKG done, V/S checked,patient is alert and oriented,heparin drip is running at 14 cc/hr. Dr. Delma Post made aware, will continue present management.

## 2023-02-17 NOTE — Progress Notes (Signed)
PROGRESS NOTE    Jamaiya Tuomi  ZOX:[id number] DOB: 1946-02-03 DOA: 02/14/2023 PCP: Kirstie Peri, MD  Chief Complaint  Patient presents with   Leg Pain    Brief Narrative:   77 y.o. female with hx of recent hospitalization in 11/'24 treated for left renal infarct and left renal artery thromboembolism, started on anticoagulation with Eliquis, CAD with history PCI, PAD, HFpEF, COPD, current smoker half pack Mathieu Schloemer day, who presented with painful right foot.     Found to have imaging concerning for occlusion of distal right SFA.  Admitted to hospital with vascular following.  Oncology consulting given acute leukemia.   Assessment & Plan:   Principal Problem:   Arterial occlusion, lower extremity (HCC) Active Problems:   Hyperleukocytosis   Unstable angina (HCC)   GERD (gastroesophageal reflux disease)   COPD (chronic obstructive pulmonary disease) (HCC)   Tobacco abuse   Coronary artery disease involving native coronary artery of native heart with unstable angina pectoris (HCC)   Hyperlipidemia LDL goal <70   Glaucoma   History of Renal infarction   AML (acute myeloblastic leukemia) (HCC)   Acquired thrombophilia (HCC)  Goals of Care Appreciate palliative care assistance.  She's changed her code status to DNR.  Acute Myeloblastic Leukemia Leukocytosis Thrombocytopenia Appreciate oncology recommendations - they discussed transfer to [facility] center, but decision made to stay here.  Planning for bone marrow aspirate and biopsy.  She's been started on hydroxyurea -> increased to 2000 mg daily today due to worsening leukocytosis. S/p left iliac bone marrow aspirate and core biopsy S/p R internal jugular approach port catheter placement  Occlusion of distal right superficial femoral artery with reconstitution of popliteal artery Acute Limb Iscehmia Vascular planning for R SFA intervention Monday tentatively Per oncology, if she needs urgent surgery can get preoperative platelet  transfusion 1 hr before surgery followed by intraoperative platelet transfusion as well as post operative platelet transfusion Heparin gtt  Hypotension On arrival back from procedure Has improved with IVF - possibly related to meds she got for sedation Will monitor, holding parameters for metop   Atrial Fibrillation with RVR Seems like this is new, I don't see hx of this clearly Rate improved now, will continue on metop for now, may need dilt gtt if worsening TSH Echo 11/2022 with grade II diastolic dysfunction, normal right and left atria in size Continue metoprolol with holding parameters due to blood pressure  Hx L renal infarct and L renal artery thromboembolism On eliquis prior to admission, currently on heparin gtt  CAD with hx PCI Eliquis (now on heparin gtt), not on antiplatelet or statin it appears (hx statin intolerance)  HFpEF Appears euvolemic Lasix on hold  COPD Chronic smoker, recently half PPD  GERD PPI  Hypertension Metoprolol  Anxiety Valium prn     DVT prophylaxis: heparin gtt Code Status: full Family Communication: none Disposition:   Status is: Inpatient Remains inpatient appropriate because: need for continued inpatient care   Consultants:  Oncology Vascular IR palliative  Procedures:  12/20 IMPRESSION: 1. Successful left iliac bone marrow aspirate and core biopsy 2. Successful right IJ approach port catheter placement (Bard Clear Vue). The catheter tubing is at the superior cavoatrial junction and the catheter is ready for immediate use.  Antimicrobials:  Anti-infectives (From admission, onward)    None       Subjective: Groggy after procedure  Objective: Vitals:   02/17/23 1030 02/17/23 1050 02/17/23 1338 02/17/23 1512  BP: (!) 81/72 92/68  112/70  Pulse: Marland Kitchen)  101 (!) 104    Resp: 17 20  20   Temp:   98.9 F (37.2 C) 98.5 F (36.9 C)  TempSrc:   Oral Oral  SpO2: 95% 95%    Weight:      Height:         Intake/Output Summary (Last 24 hours) at 02/17/2023 1851 Last data filed at 02/17/2023 1500 Gross per 24 hour  Intake 911.24 ml  Output --  Net 911.24 ml   Filed Weights   02/14/23 1150 02/16/23 0500 02/17/23 0300  Weight: 78 kg 83.9 kg 86.3 kg    Examination:  General: No acute distress. Cardiovascular: irregularly irregular, tachy Lungs: unlabored Neurological: Sleepy, but awakens appropriately post procedure. Moves all extremities 4. Cranial nerves II through XII grossly intact. Extremities: discoloration of RLE toes, unable to appreciate pulses    Data Reviewed: I have personally reviewed following labs and imaging studies  CBC: Recent Labs  Lab 02/14/23 2041 02/15/23 0605 02/15/23 1534 02/16/23 0324 02/17/23 0350  WBC 63.0* 60.6* 49.0* 56.9* 84.1*  NEUTROABS 10.8* 7.9* 25.0* 29.8* 51.1*  HGB 11.0* 9.9* 10.3* 9.5* 10.1*  HCT 34.6* 31.2* 32.6* 30.7* 32.3*  MCV 96.9 97.2 96.7 96.8 97.3  PLT 71* 57* 57* 53* 54*    Basic Metabolic Panel: Recent Labs  Lab 02/14/23 1413 02/14/23 2041 02/15/23 0605 02/16/23 0324 02/17/23 0350  NA 141  --  138 137 140  K 2.8*  --  3.9 3.0* 4.3  CL 105  --  105 106 110  CO2 27  --  24 25 23   GLUCOSE 113*  --  116* 127* 119*  BUN 7*  --  6* 5* 9  CREATININE 1.05*  --  0.95 1.07* 1.25*  CALCIUM 8.7*  --  7.9* 7.7* 8.1*  MG  --  1.7 1.6* 2.2  --   PHOS  --  2.0* 2.7  --   --     GFR: Estimated Creatinine Clearance: 41.2 mL/min (Dyani Babel) (by C-G formula based on SCr of 1.25 mg/dL (H)).  Liver Function Tests: Recent Labs  Lab 02/14/23 2041 02/16/23 0324 02/17/23 0350  AST 15 19 19   ALT 11 12 15   ALKPHOS 107 101 114  BILITOT 0.5 0.7 0.8  PROT 6.8 5.7* 6.3*  ALBUMIN 3.2* 2.6* 2.6*    CBG: No results for input(s): "GLUCAP" in the last 168 hours.   Recent Results (from the past 240 hours)  Culture, blood (Routine X 2) w Reflex to ID Panel     Status: None (Preliminary result)   Collection Time: 02/14/23 11:54 PM    Specimen: BLOOD  Result Value Ref Range Status   Specimen Description BLOOD RIGHT ANTECUBITAL  Final   Special Requests   Final    BOTTLES DRAWN AEROBIC AND ANAEROBIC Blood Culture adequate volume   Culture   Final    NO GROWTH 2 DAYS Performed at [facility], [address], [place], [place] [address]    Report Status PENDING  Incomplete  Culture, blood (Routine X 2) w Reflex to ID Panel     Status: None (Preliminary result)   Collection Time: 02/14/23 11:54 PM   Specimen: BLOOD RIGHT HAND  Result Value Ref Range Status   Specimen Description   Final    BLOOD RIGHT HAND Performed at [facility], [address] [place], [place] [address]    Special Requests   Final    BOTTLES DRAWN AEROBIC AND ANAEROBIC Blood Culture adequate volume  Culture   Final    NO GROWTH 2 DAYS Performed at [facility], [address], [place], [place] [address]    Report Status PENDING  Incomplete  Respiratory (~20 pathogens) panel by PCR     Status: None   Collection Time: 02/15/23 12:10 PM   Specimen: Nasopharyngeal Swab; Respiratory  Result Value Ref Range Status   Adenovirus NOT DETECTED NOT DETECTED Final   Coronavirus 229E NOT DETECTED NOT DETECTED Final    Comment: (NOTE) The Coronavirus on the Respiratory Panel, DOES NOT test for the novel  Coronavirus (2019 nCoV)    Coronavirus HKU1 NOT DETECTED NOT DETECTED Final   Coronavirus NL63 NOT DETECTED NOT DETECTED Final   Coronavirus OC43 NOT DETECTED NOT DETECTED Final   Metapneumovirus NOT DETECTED NOT DETECTED Final   Rhinovirus / Enterovirus NOT DETECTED NOT DETECTED Final   Influenza Darnell Stimson NOT DETECTED NOT DETECTED Final   Influenza B NOT DETECTED NOT DETECTED Final   Parainfluenza Virus 1 NOT DETECTED NOT DETECTED Final   Parainfluenza Virus 2 NOT DETECTED NOT DETECTED Final   Parainfluenza Virus 3 NOT DETECTED NOT DETECTED Final   Parainfluenza Virus 4 NOT DETECTED NOT DETECTED Final   Respiratory Syncytial Virus NOT DETECTED  NOT DETECTED Final   Bordetella pertussis NOT DETECTED NOT DETECTED Final   Bordetella Parapertussis NOT DETECTED NOT DETECTED Final   Chlamydophila pneumoniae NOT DETECTED NOT DETECTED Final   Mycoplasma pneumoniae NOT DETECTED NOT DETECTED Final    Comment: Performed at [facility], [address] [place], [place] [address]         Radiology Studies: DG CHEST PORT 1 VIEW Result Date: 02/17/2023 CLINICAL DATA:  Shortness of breath EXAM: PORTABLE CHEST 1 VIEW COMPARISON:  02/14/2023 FINDINGS: Right-sided central venous port tip over the SVC. Stable cardiomediastinal silhouette. Emphysema. No acute airspace disease, pleural effusion or pneumothorax. IMPRESSION: No active disease. Emphysema. Electronically Signed   By: Jasmine Pang M.D.   On: 02/17/2023 15:29   IR IMAGING GUIDED PORT INSERTION Result Date: 02/17/2023 INDICATION: 77 year old female for Sherian Valenza new diagnosis of leukocytosis concerning for leukemia. She presents for combined bone marrow biopsy and port catheter placement. EXAM: CT GUIDED BONE MARROW ASPIRATION AND CORE BIOPSY IR PORT PLACEMENT Interventional Radiologist:  Sterling Big, MD MEDICATIONS: None. ANESTHESIA/SEDATION: Moderate (conscious) sedation was employed during this procedure. Chonda Baney total of 3.5 milligrams versed and 150 micrograms fentanyl were administered intravenously by the Radiology nurse. The patient's level of consciousness and vital signs were monitored continuously by radiology nursing throughout the procedure under my direct supervision. Total monitored sedation time: 60 minutes FLUOROSCOPY: Radiation exposure index: 11.8 mGy reference air kerma COMPLICATIONS: None immediate. Estimated blood loss: <25 mL PROCEDURE: Informed written consent was obtained from the patient after Lauralie Blacksher thorough discussion of the procedural risks, benefits and alternatives. All questions were addressed. Maximal Sterile Barrier Technique was utilized including caps, mask, sterile  gowns, sterile gloves, sterile drape, hand hygiene and skin antiseptic. Calin Ellery timeout was performed prior to the initiation of the procedure. BONE MARROW BIOPSY The patient was positioned prone and fluoroscopy was performed of the pelvis to demonstrate the iliac marrow spaces. Maximal barrier sterile technique utilized including caps, mask, sterile gowns, sterile gloves, large sterile drape, hand hygiene, and betadine prep. Under sterile conditions and local anesthesia, an 11 gauge coaxial bone biopsy needle was advanced into the left iliac marrow space. Needle position was confirmed with CT imaging. Initially, bone marrow aspiration was performed. Next, the 11 gauge outer  cannula was utilized to obtain Jaggar Benko left iliac bone marrow core biopsy. Needle was removed. Hemostasis was obtained with compression. The patient tolerated the procedure well. Samples were prepared with the cytotechnologist. PORT PLACEMENT The patient was repositioned into the supine position. The right neck and chest were sterilely prepped in the standard fashion using chlorhexidine skin prep. Ultrasound demonstrated patency of the right internal jugular vein, and this was documented with an image. Under real-time ultrasound guidance, this vein was accessed with Lillyanne Bradburn 21 gauge micropuncture needle and image documentation was performed. Joley Utecht small dermatotomy was made at the access site with an 11 scalpel. Alexande Sheerin 0.018" wire was advanced into the SVC and the access needle exchanged for Colbie Sliker 32F micropuncture vascular sheath. The 0.018" wire was then removed and Najia Hurlbutt 0.035" wire advanced into the IVC. An appropriate location for the subcutaneous reservoir was selected below the clavicle and an incision was made through the skin and underlying soft tissues. The subcutaneous tissues were then dissected using Vannah Nadal combination of blunt and sharp surgical technique and Maisa Bedingfield pocket was formed. Gurfateh Mcclain single lumen power injectable portacatheter (Bard Clear Vue) was then tunneled through the  subcutaneous tissues from the pocket to the dermatotomy and the port reservoir placed within the subcutaneous pocket. The venous access site was then serially dilated and Demaris Bousquet peel away vascular sheath placed over the wire. The wire was removed and the port catheter advanced into position under fluoroscopic guidance. The catheter tip is positioned in the superior cavoatrial junction. This was documented with Jil Penland spot image. The portacatheter was then tested and found to flush and aspirate well. The port was flushed with saline followed by 100 units/mL heparinized saline. The pocket was then closed in two layers using first subdermal inverted interrupted absorbable sutures followed by Sebastiana Wuest running subcuticular suture. The epidermis was then sealed with Dermabond. The dermatotomy at the venous access site was also closed with Dermabond. IMPRESSION: 1. Successful left iliac bone marrow aspirate and core biopsy 2. Successful right IJ approach port catheter placement (Bard Clear Vue). The catheter tubing is at the superior cavoatrial junction and the catheter is ready for immediate use. Electronically Signed   By: Malachy Moan M.D.   On: 02/17/2023 13:13   IR BONE MARROW BIOPSY & ASPIRATION Result Date: 02/17/2023 INDICATION: 77 year old female for Caelie Remsburg new diagnosis of leukocytosis concerning for leukemia. She presents for combined bone marrow biopsy and port catheter placement. EXAM: CT GUIDED BONE MARROW ASPIRATION AND CORE BIOPSY IR PORT PLACEMENT Interventional Radiologist:  Sterling Big, MD MEDICATIONS: None. ANESTHESIA/SEDATION: Moderate (conscious) sedation was employed during this procedure. Bird Tailor total of 3.5 milligrams versed and 150 micrograms fentanyl were administered intravenously by the Radiology nurse. The patient's level of consciousness and vital signs were monitored continuously by radiology nursing throughout the procedure under my direct supervision. Total monitored sedation time: 60 minutes  FLUOROSCOPY: Radiation exposure index: 11.8 mGy reference air kerma COMPLICATIONS: None immediate. Estimated blood loss: <25 mL PROCEDURE: Informed written consent was obtained from the patient after Graeden Bitner thorough discussion of the procedural risks, benefits and alternatives. All questions were addressed. Maximal Sterile Barrier Technique was utilized including caps, mask, sterile gowns, sterile gloves, sterile drape, hand hygiene and skin antiseptic. Ibrohim Simmers timeout was performed prior to the initiation of the procedure. BONE MARROW BIOPSY The patient was positioned prone and fluoroscopy was performed of the pelvis to demonstrate the iliac marrow spaces. Maximal barrier sterile technique utilized including caps, mask, sterile gowns, sterile gloves, large sterile drape, hand hygiene,  and betadine prep. Under sterile conditions and local anesthesia, an 11 gauge coaxial bone biopsy needle was advanced into the left iliac marrow space. Needle position was confirmed with CT imaging. Initially, bone marrow aspiration was performed. Next, the 11 gauge outer cannula was utilized to obtain Zetta Stoneman left iliac bone marrow core biopsy. Needle was removed. Hemostasis was obtained with compression. The patient tolerated the procedure well. Samples were prepared with the cytotechnologist. PORT PLACEMENT The patient was repositioned into the supine position. The right neck and chest were sterilely prepped in the standard fashion using chlorhexidine skin prep. Ultrasound demonstrated patency of the right internal jugular vein, and this was documented with an image. Under real-time ultrasound guidance, this vein was accessed with Tavyn Kurka 21 gauge micropuncture needle and image documentation was performed. Morissa Obeirne small dermatotomy was made at the access site with an 11 scalpel. Earleen Aoun 0.018" wire was advanced into the SVC and the access needle exchanged for Morning Halberg 74F micropuncture vascular sheath. The 0.018" wire was then removed and Cassanda Walmer 0.035" wire advanced into the  IVC. An appropriate location for the subcutaneous reservoir was selected below the clavicle and an incision was made through the skin and underlying soft tissues. The subcutaneous tissues were then dissected using Ineze Serrao combination of blunt and sharp surgical technique and Abriel Geesey pocket was formed. Ramal Eckhardt single lumen power injectable portacatheter (Bard Clear Vue) was then tunneled through the subcutaneous tissues from the pocket to the dermatotomy and the port reservoir placed within the subcutaneous pocket. The venous access site was then serially dilated and Careli Luzader peel away vascular sheath placed over the wire. The wire was removed and the port catheter advanced into position under fluoroscopic guidance. The catheter tip is positioned in the superior cavoatrial junction. This was documented with Taralyn Ferraiolo spot image. The portacatheter was then tested and found to flush and aspirate well. The port was flushed with saline followed by 100 units/mL heparinized saline. The pocket was then closed in two layers using first subdermal inverted interrupted absorbable sutures followed by Deeana Atwater running subcuticular suture. The epidermis was then sealed with Dermabond. The dermatotomy at the venous access site was also closed with Dermabond. IMPRESSION: 1. Successful left iliac bone marrow aspirate and core biopsy 2. Successful right IJ approach port catheter placement (Bard Clear Vue). The catheter tubing is at the superior cavoatrial junction and the catheter is ready for immediate use. Electronically Signed   By: Malachy Moan M.D.   On: 02/17/2023 13:13        Scheduled Meds:  acetaminophen  1,000 mg Oral Q8H   allopurinol  300 mg Oral Daily   Chlorhexidine Gluconate Cloth  6 each Topical Daily   hydroxyurea  2,000 mg Oral Daily   metoprolol succinate  25 mg Oral Daily   pantoprazole  40 mg Oral Daily   sodium chloride flush  3 mL Intravenous Q12H   Continuous Infusions:  sodium chloride 150 mL/hr at 02/17/23 1029   heparin  1,400 Units/hr (02/17/23 1224)     LOS: 3 days    Time spent: over 30 min    Lacretia Nicks, MD [facility]   To contact the attending provider between 7A-7P or the covering provider during after hours 7P-7A, please log into the web site [email or web address] and access using universal [place] password for that web site. If you do not have the password, please call the hospital operator.  02/17/2023, 6:51 PM

## 2023-02-17 NOTE — Consult Note (Addendum)
Palliative Care Consult Note                                  Date: 02/17/2023   [name]  DOB: [birth year]  MRN: [id number]  Age / Sex: 77 y.o., female  PCP: Kirstie Peri, MD Referring Physician: Zigmund Daniel., *  Reason for Consultation: Establishing goals of care  HPI/Patient Profile: 77 y.o. female  with past medical history of CAD with history of PCI, PAD, HFpEF, and COPD admitted on 02/14/2023 with a painful right foot with cyanotic changes and numbness in her toes.   Past Medical History:  Diagnosis Date   Asthma    COPD (chronic obstructive pulmonary disease) (HCC)    Coronary artery disease    Cough    " ALL MY LIFE "   Dyspnea    GERD (gastroesophageal reflux disease)    History of blood clots    patient had a blood clot in her kidney 01/03/2023.  pt is on Eliquis   Hypertension    NSTEMI (non-ST elevated myocardial infarction) (HCC) 09/01/2016   DES to Cx/OM bifurcation   Unstable angina (HCC) 08/2016    Subjective:   I have reviewed medical records including EPIC notes, labs and imaging, assessed the patient and then met with the patient and her daughters Lisa [name] and Lisa [name] to discuss diagnosis prognosis, GOC, EOL wishes, disposition and options.  I introduced Palliative Medicine as specialized medical care for people living with serious illness. It focuses on providing relief from symptoms and stress of a serious illness. The goal is to improve quality of life for both the patient and the family.  Patient faces treatment option decisions, advanced directive decisions and anticipatory care needs.   Today's Discussion: Patient and her daughters have a good understanding of both the patient's chronic and acute illnesses. The patient shares that although she has many chronic conditions she has been independent prior to admission. The patient shares she came to the hospital for her foot and  while here found out she has a blood cancer (AML). Vascular surgery has seen patient but there is no indication for urgent intervention at this time. She had a bone marrow biopsy and port placement this morning. She shares that oncology has shared that there may be intensive or less intensive therapies available. She is interested in hearing what these options can offer her both for her quality and quantity of life.   Prior to hospitalization the patient was living with and assisting her daughter who has RA. She was completing all ADLs and IADLs independently. The patient is retired. 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 The patient shares she has had a hard life but a good life. She was married at 69 and had six children. Four of her children are still alive, three daughters and one son. She becomes tearful when she shares she would never want to be a burden to her children.  We discussed advanced directives. She would like her daughters Lazarus Gowda and Leo Rod to be her proxy decision makers. She would like to complete HCPOA paperwork while admitted. We discussed code status. Recommended consideration of DNR status, understanding evidenced-based poor outcomes in similar hospitalized patients, as the cause of the arrest is likely associated with chronic/terminal disease rather than a reversible acute cardio-pulmonary event. She would like to change her code status to do not attempt  resuscitation. We discussed scope of care she would like limited scope of treatment as she would not want intubation-- changed to DNI.   MOST form was completed today. The patient and family outlined their wishes for the following treatment decisions:  Cardiopulmonary Resuscitation: Do Not Attempt Resuscitation (DNR/No CPR)  Medical Interventions: Limited Additional Interventions: Use medical treatment, IV fluids and cardiac monitoring as indicated, DO NOT USE intubation or mechanical ventilation. May consider  use of less invasive airway support such as BiPAP or CPAP. Also provide comfort measures. Transfer to the hospital if indicated. Avoid intensive care.   Antibiotics: Determine use of limitation of antibiotics when infection occurs  IV Fluids: IV fluids for a defined trial period  Feeding Tube: No feeding tube     Discussed the importance of continued conversation with family and the medical providers regarding overall plan of care and treatment options, ensuring decisions are within the context of the patient's values and GOCs. Offered outpatient palliative follow up-- she is not interested at this time.  Questions and concerns were addressed. Hard Choices booklet left for review. The family was encouraged to call with questions or concerns. PMT will continue to support holistically.  Review of Systems  Respiratory:  Positive for shortness of breath.   Musculoskeletal:        Foot pain and numbness    Objective:   Primary Diagnoses: Present on Admission:  Arterial occlusion, lower extremity (HCC)  AML (acute myeloblastic leukemia) (HCC)  Glaucoma  Unstable angina (HCC)  GERD (gastroesophageal reflux disease)  COPD (chronic obstructive pulmonary disease) (HCC)  Tobacco abuse  Coronary artery disease involving native coronary artery of native heart with unstable angina pectoris (HCC)  Hyperlipidemia LDL goal <70  History of Renal infarction  Acquired thrombophilia (HCC)  Hyperleukocytosis   Physical Exam Vitals reviewed.  Constitutional:      Appearance: She is ill-appearing.     Interventions: Nasal cannula in place.  Cardiovascular:     Rate and Rhythm: Tachycardia present.  Pulmonary:     Effort: Pulmonary effort is normal.  Skin:    General: Skin is warm and dry.     Comments: cyanosis of right toes  Neurological:     Mental Status: She is alert and oriented to person, place, and time.     Vital Signs:  BP 92/68   Pulse (!) 104   Temp 98.5 F (36.9 C) (Oral)    Resp 20   Ht 5\' 7"  (1.702 m)   Wt 86.3 kg   LMP 09/01/2016 (LMP Unknown)   SpO2 95%   BMI 29.79 kg/m    Advanced Care Planning:   Existing Vynca/ACP Documentation: None  Primary Decision Maker: PATIENT. If patient is unable to make decisions she would like her daughters Lazarus Gowda or Leo Rod to make decisions for her.  Code Status/Advance Care Planning: DNR  Decisions/Changes to ACP: MOST form completed  Assessment & Plan:   SUMMARY OF RECOMMENDATIONS   DNR/DNI Treat the treatable Explore cancer therapy options Complete HCPOA documentation- spiritual care consulted Continued PMT support   Discussed with: bedside RN and Dr. Lowell Guitar  Time Total: 75 minutes  Thank you for allowing [place] to participate in the care of Lacora Camera PMT will continue to support holistically.   Signed by: Sarina Ser, NP Palliative Medicine Team  Team Phone # [phone] (Nights/Weekends)  02/17/2023, 1:15 PM

## 2023-02-17 NOTE — Procedures (Signed)
Interventional Radiology Procedure Note  Procedure:  1.) Bone marrow biopsy 2.) Placement of a right chest Bard ClearVue power port.  Catheter tip at cavoiatrial jxn and device ready for immediate use.   Complications: None  Estimated Blood Loss: None  Recommendations: - Routine line care  Signed,  Sterling Big, MD

## 2023-02-17 NOTE — Progress Notes (Signed)
  Progress Note    02/17/2023 8:54 AM * No surgery found *  Subjective:  R foot rest pain better controlled yesterday.   Vitals:   02/17/23 0845 02/17/23 0850  BP: 123/89 107/71  Pulse: (!) 113 94  Resp: 16 18  Temp:    SpO2: 93% 93%   Physical Exam: Lungs:  non labored Extremities:  R foot warm with motor and sensation intact; brisk PT by doppler Neurologic: A&O  CBC    Component Value Date/Time   WBC 84.1 (HH) 02/17/2023 0350   RBC 3.32 (L) 02/17/2023 0350   HGB 10.1 (L) 02/17/2023 0350   HCT 32.3 (L) 02/17/2023 0350   PLT 54 (L) 02/17/2023 0350   MCV 97.3 02/17/2023 0350   MCH 30.4 02/17/2023 0350   MCHC 31.3 02/17/2023 0350   RDW 20.3 (H) 02/17/2023 0350   LYMPHSABS 10.5 (H) 02/17/2023 0350   MONOABS 22.1 (H) 02/17/2023 0350   EOSABS 0.2 02/17/2023 0350   BASOSABS 0.1 02/17/2023 0350    BMET    Component Value Date/Time   NA 140 02/17/2023 0350   K 4.3 02/17/2023 0350   CL 110 02/17/2023 0350   CO2 23 02/17/2023 0350   GLUCOSE 119 (H) 02/17/2023 0350   BUN 9 02/17/2023 0350   CREATININE 1.25 (H) 02/17/2023 0350   CALCIUM 8.1 (L) 02/17/2023 0350   GFRNONAA 44 (L) 02/17/2023 0350   GFRAA >60 02/24/2018 1937    INR    Component Value Date/Time   INR 1.5 (H) 02/14/2023 2100     Intake/Output Summary (Last 24 hours) at 02/17/2023 0854 Last data filed at 02/16/2023 2200 Gross per 24 hour  Intake 1650.06 ml  Output --  Net 1650.06 ml     Assessment/Plan:  77 y.o. female with renal infarct, R SFA thrombus, acute leukemia  Rest pain present but more tolerable overnight Vomited after taking oxycodone; switch to tramadol Oncology following for acute leukemia Continue IV heparin R foot with motor and sensation intact; no indication for urgent intervention at this time   Lisa Rutter, PA-C [facility] [phone] 02/17/2023 8:54 AM

## 2023-02-17 NOTE — Consult Note (Signed)
PHARMACY - ANTICOAGULATION CONSULT NOTE  Pharmacy Consult for IV Heparin Indication:  Occlusion superficial femoral artery  Patient Measurements: Height: 5\' 7"  (170.2 cm) Weight: 86.3 kg (190 lb 3.2 oz) IBW/kg (Calculated) : 61.6 Heparin Dosing Weight: 77.3 kg  Labs: Recent Labs    02/15/23 0605 02/15/23 1534 02/15/23 2051 02/16/23 0324 02/17/23 0350 02/17/23 2000  HGB 9.9* 10.3*  --  9.5* 10.1*  --   HCT 31.2* 32.6*  --  30.7* 32.3*  --   PLT 57* 57*  --  53* 54*  --   APTT 63* 63*   < > 83* 68* 70*  HEPARINUNFRC >1.10*  --   --  1.08* 0.51 0.37  CREATININE 0.95  --   --  1.07* 1.25*  --    < > = values in this interval not displayed.    Estimated Creatinine Clearance: 41.2 mL/min (A) (by C-G formula based on SCr of 1.25 mg/dL (H)).  Medical History: Past Medical History:  Diagnosis Date   Asthma    COPD (chronic obstructive pulmonary disease) (HCC)    Coronary artery disease    Cough    " ALL MY LIFE "   Dyspnea    GERD (gastroesophageal reflux disease)    History of blood clots    patient had a blood clot in her kidney 01/03/2023.  pt is on Eliquis   Hypertension    NSTEMI (non-ST elevated myocardial infarction) (HCC) 09/01/2016   DES to Cx/OM bifurcation   Unstable angina (HCC) 08/2016    Medications:  Apixaban 5 mg BID prior to admission, last dose unknown  Assessment: 77 y/o F with medical history as above and including history of blood clots on apixaban presenting with right leg pain with swelling. 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 Pharmacy consulted to initiate and manage heparin infusion for occlusion superficial femoral artery.  Shortly after AM heparin level resulted, patient was taken to bone marrow biopsy and port placement procedure where heparin infusion was held. Due to length of time held, will restart at previous rate and check 8 hour levels.  Heparin level 0.37,  aPTT 70 sec (both therapeutic) on infusion at 1400 units/hr (appears that apixaban effects have worn off so will utilize heparin level only for monitoring). No bleeding noted.  Goal of Therapy:  Heparin level 0.3-0.7 units/ml Monitor platelets by anticoagulation protocol: Yes   Plan:  Continue heparin infusion at 1400 units/hr Daily heparin level and CBC  Christoper Fabian, PharmD, BCPS Please see amion for complete clinical pharmacist phone list 02/17/2023 9:55 PM

## 2023-02-17 NOTE — Plan of Care (Signed)

## 2023-02-18 DIAGNOSIS — I70209 Unspecified atherosclerosis of native arteries of extremities, unspecified extremity: Secondary | ICD-10-CM | POA: Diagnosis not present

## 2023-02-18 DIAGNOSIS — I743 Embolism and thrombosis of arteries of the lower extremities: Secondary | ICD-10-CM | POA: Diagnosis not present

## 2023-02-18 DIAGNOSIS — Z7189 Other specified counseling: Secondary | ICD-10-CM | POA: Diagnosis not present

## 2023-02-18 DIAGNOSIS — Z515 Encounter for palliative care: Secondary | ICD-10-CM | POA: Diagnosis not present

## 2023-02-18 LAB — URINALYSIS, W/ REFLEX TO CULTURE (INFECTION SUSPECTED)
Bilirubin Urine: NEGATIVE
Glucose, UA: NEGATIVE mg/dL
Hgb urine dipstick: NEGATIVE
Ketones, ur: 5 mg/dL — AB
Leukocytes,Ua: NEGATIVE
Nitrite: NEGATIVE
Protein, ur: NEGATIVE mg/dL
Specific Gravity, Urine: 1.019 (ref 1.005–1.030)
pH: 5 (ref 5.0–8.0)

## 2023-02-18 LAB — COMPREHENSIVE METABOLIC PANEL
ALT: 16 U/L (ref 0–44)
AST: 21 U/L (ref 15–41)
Albumin: 2.4 g/dL — ABNORMAL LOW (ref 3.5–5.0)
Alkaline Phosphatase: 106 U/L (ref 38–126)
Anion gap: 7 (ref 5–15)
BUN: 8 mg/dL (ref 8–23)
CO2: 23 mmol/L (ref 22–32)
Calcium: 7.6 mg/dL — ABNORMAL LOW (ref 8.9–10.3)
Chloride: 110 mmol/L (ref 98–111)
Creatinine, Ser: 1.2 mg/dL — ABNORMAL HIGH (ref 0.44–1.00)
GFR, Estimated: 46 mL/min — ABNORMAL LOW (ref >60)
Glucose, Bld: 109 mg/dL — ABNORMAL HIGH (ref 70–99)
Potassium: 3.7 mmol/L (ref 3.5–5.1)
Sodium: 140 mmol/L (ref 135–145)
Total Bilirubin: 0.7 mg/dL (ref <1.2)
Total Protein: 5.5 g/dL — ABNORMAL LOW (ref 6.5–8.1)

## 2023-02-18 LAB — HIV ANTIBODY (ROUTINE TESTING W REFLEX): HIV Screen 4th Generation wRfx: NONREACTIVE

## 2023-02-18 LAB — CBC WITH DIFFERENTIAL/PLATELET
Abs Immature Granulocytes: 0.52 10*3/uL — ABNORMAL HIGH (ref 0.00–0.07)
Basophils Absolute: 0.1 10*3/uL (ref 0.0–0.1)
Basophils Relative: 0 %
Eosinophils Absolute: 0.2 10*3/uL (ref 0.0–0.5)
Eosinophils Relative: 0 %
HCT: 29.7 % — ABNORMAL LOW (ref 36.0–46.0)
Hemoglobin: 9.1 g/dL — ABNORMAL LOW (ref 12.0–15.0)
Immature Granulocytes: 1 %
Lymphocytes Relative: 11 %
Lymphs Abs: 7.6 10*3/uL — ABNORMAL HIGH (ref 0.7–4.0)
MCH: 30.5 pg (ref 26.0–34.0)
MCHC: 30.6 g/dL (ref 30.0–36.0)
MCV: 99.7 fL (ref 80.0–100.0)
Monocytes Absolute: 22.5 10*3/uL — ABNORMAL HIGH (ref 0.1–1.0)
Monocytes Relative: 32 %
Neutro Abs: 39 10*3/uL — ABNORMAL HIGH (ref 1.7–7.7)
Neutrophils Relative %: 56 %
Platelets: 42 10*3/uL — ABNORMAL LOW (ref 150–400)
RBC: 2.98 MIL/uL — ABNORMAL LOW (ref 3.87–5.11)
RDW: 20.6 % — ABNORMAL HIGH (ref 11.5–15.5)
WBC: 70 10*3/uL (ref 4.0–10.5)
nRBC: 0.2 % (ref 0.0–0.2)

## 2023-02-18 LAB — STREP PNEUMONIAE URINARY ANTIGEN: Strep Pneumo Urinary Antigen: NEGATIVE

## 2023-02-18 LAB — EXPECTORATED SPUTUM ASSESSMENT W GRAM STAIN, RFLX TO RESP C

## 2023-02-18 LAB — HEPARIN LEVEL (UNFRACTIONATED): Heparin Unfractionated: 0.38 [IU]/mL (ref 0.30–0.70)

## 2023-02-18 LAB — URIC ACID: Uric Acid, Serum: 3.5 mg/dL (ref 2.5–7.1)

## 2023-02-18 LAB — BRAIN NATRIURETIC PEPTIDE: B Natriuretic Peptide: 254.8 pg/mL — ABNORMAL HIGH (ref 0.0–100.0)

## 2023-02-18 MED ORDER — MOMETASONE FURO-FORMOTEROL FUM 200-5 MCG/ACT IN AERO
2.0000 | INHALATION_SPRAY | Freq: Two times a day (BID) | RESPIRATORY_TRACT | Status: DC
  Administered 2023-02-18 – 2023-02-19 (×3): 2 via RESPIRATORY_TRACT
  Filled 2023-02-18: qty 8.8

## 2023-02-18 MED ORDER — IPRATROPIUM-ALBUTEROL 0.5-2.5 (3) MG/3ML IN SOLN
3.0000 mL | Freq: Four times a day (QID) | RESPIRATORY_TRACT | Status: DC | PRN
  Administered 2023-02-18 – 2023-02-19 (×2): 3 mL via RESPIRATORY_TRACT
  Filled 2023-02-18 (×2): qty 3

## 2023-02-18 MED ORDER — METOPROLOL TARTRATE 25 MG PO TABS
25.0000 mg | ORAL_TABLET | Freq: Two times a day (BID) | ORAL | Status: DC
Start: 2023-02-18 — End: 2023-02-21
  Administered 2023-02-18 – 2023-02-21 (×6): 25 mg via ORAL
  Filled 2023-02-18 (×6): qty 1

## 2023-02-18 MED ORDER — IPRATROPIUM-ALBUTEROL 0.5-2.5 (3) MG/3ML IN SOLN
3.0000 mL | Freq: Four times a day (QID) | RESPIRATORY_TRACT | Status: DC
  Administered 2023-02-18: 3 mL via RESPIRATORY_TRACT
  Filled 2023-02-18: qty 3

## 2023-02-18 MED ORDER — LEVOFLOXACIN IN D5W 750 MG/150ML IV SOLN
750.0000 mg | INTRAVENOUS | Status: DC
  Administered 2023-02-18: 750 mg via INTRAVENOUS
  Filled 2023-02-18: qty 150

## 2023-02-18 NOTE — Progress Notes (Addendum)
  Progress Note    02/18/2023 8:35 AM  Subjective:  continues to have rest pain R foot   Vitals:   02/18/23 0258 02/18/23 0355  BP: 99/80   Pulse: 100 100  Resp: 19   Temp: 98.1 F (36.7 C)   SpO2: 95% 94%   Physical Exam: Lungs:  non labored Extremities:  discolored toes R foot; foot warm with motor and sensation intact Neurologic: A&O  CBC    Component Value Date/Time   WBC 70.0 (HH) 02/18/2023 0515   RBC 2.98 (L) 02/18/2023 0515   HGB 9.1 (L) 02/18/2023 0515   HCT 29.7 (L) 02/18/2023 0515   PLT 42 (L) 02/18/2023 0515   MCV 99.7 02/18/2023 0515   MCH 30.5 02/18/2023 0515   MCHC 30.6 02/18/2023 0515   RDW 20.6 (H) 02/18/2023 0515   LYMPHSABS 7.6 (H) 02/18/2023 0515   MONOABS 22.5 (H) 02/18/2023 0515   EOSABS 0.2 02/18/2023 0515   BASOSABS 0.1 02/18/2023 0515    BMET    Component Value Date/Time   NA 140 02/18/2023 0515   K 3.7 02/18/2023 0515   CL 110 02/18/2023 0515   CO2 23 02/18/2023 0515   GLUCOSE 109 (H) 02/18/2023 0515   BUN 8 02/18/2023 0515   CREATININE 1.20 (H) 02/18/2023 0515   CALCIUM 7.6 (L) 02/18/2023 0515   GFRNONAA 46 (L) 02/18/2023 0515   GFRAA >60 02/24/2018 1937    INR    Component Value Date/Time   INR 1.5 (H) 02/14/2023 2100     Intake/Output Summary (Last 24 hours) at 02/18/2023 0835 Last data filed at 02/18/2023 0700 Gross per 24 hour  Intake 2929.24 ml  Output --  Net 2929.24 ml     Assessment/Plan:  77 y.o. female with R SFA thrombus  Continues to have rest pain in R foot requiring her to hang her foot off of the edge of the bed to relieve the pain.  Tramadol is controlling pain during the day.  Motor and sensation in R foot remains intact.  Tentative plan for angiography on Monday however she will require platelet transfusion.  Oncology treating acute leukemia.   Emilie Rutter, PA-C [facility] [phone] 02/18/2023 8:35 AM  I had a lengthy conversation with the patient and her daughter  at the bedside.  She has started oral treatment for her leukemia.  This morning she states that her right toes feel better than they have.  Her pain medicine was changed yesterday.  On exam, she still has bluish discoloration on the tips of her toes on the right.  She does have Doppler signals in her PT and DP at the ankle.  She has full range of motion and sensation.  We talked about her options including palliative care, aggressive therapy, and other options.  I suspect that she will have significant limitations with her leg if we do not address her blood flow.  She is going to try to ambulate today to see what kind of symptoms ambulation brings on.  I suspect that she is going to need intervention to address her arterial insufficiency.  She has tentatively been placed on the schedule for angiography on Monday.  She may need platelet transfusions prior to this as her platelet count has dropped to 42 today.  We will follow-up with her tomorrow and make definitive plans.  Durene Cal

## 2023-02-18 NOTE — Progress Notes (Signed)
Hematology/Oncology Progress Note  Clinical Summary: Ms. Pierre Weinstock is a 77 year old female with medical history significant for presumed acute myelogenous leukemia who is currently admitted with critical limb ischemia.  Interval History: -- Patient currently on hydroxyurea 2000 mg daily. --She reports she is tolerating the hydroxyurea well with no nausea, vomiting, or diarrhea. -- Labs today show white blood cell count 70, hemoglobin 9.1, MCV 99.7, and platelets of 42.  White count down from prior with modest decreases in hemoglobin and platelets. -- Reiterated the patient once again that [facility] would be preferred in the setting of her presumed AML.  Patient notes that she would not want aggressive treatment. -- Patient reiterates that she is a DNR and would consider hospice if her clinical status does not improve as anticipated  O:  Vitals:   02/18/23 1958 02/18/23 2014  BP: 124/66   Pulse: 89   Resp: 20   Temp: 98.4 F (36.9 C)   SpO2: 93% 91%      Latest Ref Rng & Units 02/18/2023    5:15 AM 02/17/2023    3:50 AM 02/16/2023    3:24 AM  CMP  Glucose 70 - 99 mg/dL 401  027  253   BUN 8 - 23 mg/dL 8  9  5    Creatinine 0.44 - 1.00 mg/dL 6.64  4.03  4.74   Sodium 135 - 145 mmol/L 140  140  137   Potassium 3.5 - 5.1 mmol/L 3.7  4.3  3.0   Chloride 98 - 111 mmol/L 110  110  106   CO2 22 - 32 mmol/L 23  23  25    Calcium 8.9 - 10.3 mg/dL 7.6  8.1  7.7   Total Protein 6.5 - 8.1 g/dL 5.5  6.3  5.7   Total Bilirubin <1.2 mg/dL 0.7  0.8  0.7   Alkaline Phos 38 - 126 U/L 106  114  101   AST 15 - 41 U/L 21  19  19    ALT 0 - 44 U/L 16  15  12        Latest Ref Rng & Units 02/18/2023    5:15 AM 02/17/2023    3:50 AM 02/16/2023    3:24 AM  CBC  WBC 4.0 - 10.5 K/uL 70.0  84.1  56.9   Hemoglobin 12.0 - 15.0 g/dL 9.1  25.9  9.5   Hematocrit 36.0 - 46.0 % 29.7  32.3  30.7   Platelets 150 - 400 K/uL 42  54  53       GENERAL: well appearing elderly  Caucasian female in NAD  SKIN: skin color, texture, turgor are normal, no rashes or significant lesions EYES: conjunctiva are pink and non-injected, sclera clear LUNGS: clear to auscultation and percussion with normal breathing effort HEART: regular rate & rhythm and no murmurs and no lower extremity edema Musculoskeletal: no cyanosis of digits and no clubbing  PSYCH: alert & oriented x 3, fluent speech NEURO: no focal motor/sensory deficits  Assessment/Plan:  # Acute Myeloid Leukemia # Limb Ischemia -- Patient currently on 2000 mg hydroxyurea p.o. daily, white count is responding, dropped from 84.1 yesterday to 70 today. -- Hemoglobin currently at 9.1 with platelets of 42.  Okay to continue on current dose of hydroxyurea. -- Concern that the critical limb ischemia may be worsened by the patient's severely high white blood cell count and hydroxyurea. -- Patient encouraged to transfer to high-level tertiary care center with dedicated leukemia service.  Patient reports she does not want any aggressive measures and is currently DNR.  She is considering hospice and reports that these talks have been underway. -- Hematology will continue to follow.   Ulysees Barns, MD Department of Hematology/Oncology [facility] Phone: [phone] Pager: [phone] Email: [email or web address]

## 2023-02-18 NOTE — Progress Notes (Addendum)
PROGRESS NOTE    Lisa [name]  KGM:[id number] DOB: [birth year] DOA: 02/14/2023 PCP: Kirstie Peri, MD  Chief Complaint  Patient presents with   Leg Pain    Brief Narrative:   77 y.o. 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     Found to have imaging concerning for occlusion of distal right SFA.  Admitted to hospital with vascular following.  Oncology consulting given acute leukemia.   Assessment & Plan:   Principal Problem:   Arterial occlusion, lower extremity (HCC) Active Problems:   Hyperleukocytosis   Unstable angina (HCC)   GERD (gastroesophageal reflux disease)   COPD (chronic obstructive pulmonary disease) (HCC)   Tobacco abuse   Coronary artery disease involving native coronary artery of native heart with unstable angina pectoris (HCC)   Hyperlipidemia LDL goal <70   Glaucoma   History of Renal infarction   AML (acute myeloblastic leukemia) (HCC)   Acquired thrombophilia (HCC)  Goals of Care Appreciate palliative care assistance.  She's changed her code status to DNR.  Acute Myeloblastic Leukemia Leukocytosis Thrombocytopenia Appreciate oncology recommendations - they discussed transfer to [facility] center, but decision made to stay here.  Planning for bone marrow aspirate and biopsy.  She's been started on hydroxyurea -> increased to 2000 mg daily today due to worsening leukocytosis. S/p left iliac bone marrow aspirate and core biopsy - awaiting path S/p R internal jugular approach port catheter placement  Occlusion of distal right superficial femoral artery with reconstitution of popliteal artery Acute Limb Iscehmia Vascular planning for R SFA intervention Monday tentatively Per oncology, if she needs urgent surgery can get  preoperative platelet transfusion 1 hr before surgery followed by intraoperative platelet transfusion as well as post operative platelet transfusion Heparin gtt  Night Sweats  Chills  Elevated Temperature Temp to 100.1 documented on 12/19 Last night had chills, night sweats Currently on scheduled tylenol which may suppress fevers RVP negative, blood cultures ngtd Infectious workup to this point notable for CT from 12/17 did show patchy ground glass opacities in dependent lungs (infectious vs inflammatory).   She's been monitored off abx to this point, but I think given chills, night sweats, elevated temp and chest imaging with possible pneumonia, will treat empirically.   Acute Hypoxic Respiratory Failure  Community Acquired Pneumonia requiring 5 L at this time CXR 12/20 without active disease But on presentation with ground glass opacities in dependent lungs (infectious/inflammatory) -> given night sweats, chills, and chest imaging concerning for pneumonia, will treat for CAP (no c/o cough, but has intermittently c/o SOB) Repeat blood cultures with new chills, night sweats Sputum culture, urine strep, legionella  Hypotension Resolved, likely due to medications from procedure Holding parameters for metop  Atrial Fibrillation with RVR Seems like this is new, I don't see hx of this clearly Rate improved now, but still in 90's-100's -> will add metoprolol  TSH Echo 11/2022 with grade II diastolic dysfunction, normal right and left atria in size Continue metoprolol with holding parameters due to blood pressure  Hx L renal infarct and L renal artery thromboembolism On eliquis prior to admission, currently on heparin gtt  CAD with hx PCI Eliquis (now on heparin gtt), not on antiplatelet or statin it appears (hx statin intolerance)  HFpEF Appears euvolemic Lasix on hold  COPD Chronic smoker,  recently half PPD Scheduled and prn  nebs  GERD PPI  Hypertension Metoprolol  Anxiety Valium prn     DVT prophylaxis: heparin gtt Code Status: full Family Communication: daughter at bedside Disposition:   Status is: Inpatient Remains inpatient appropriate because: need for continued inpatient care   Consultants:  Oncology Vascular IR palliative  Procedures:  12/20 IMPRESSION: 1. Successful left iliac bone marrow aspirate and core biopsy 2. Successful right IJ approach port catheter placement (Bard Clear Vue). The catheter tubing is at the superior cavoatrial junction and the catheter is ready for immediate use.  Antimicrobials:  Anti-infectives (From admission, onward)    None       Subjective: C/o night sweats, chills last night  Objective: Vitals:   02/18/23 0258 02/18/23 0355 02/18/23 0947 02/18/23 0949  BP: 99/80  122/83 122/83  Pulse: 100 100 81 (!) 102  Resp: 19  19   Temp: 98.1 F (36.7 C)  97.9 F (36.6 C)   TempSrc: Oral  Oral   SpO2: 95% 94% 97%   Weight: 89 kg     Height:        Intake/Output Summary (Last 24 hours) at 02/18/2023 1034 Last data filed at 02/18/2023 0700 Gross per 24 hour  Intake 2929.24 ml  Output --  Net 2929.24 ml   Filed Weights   02/16/23 0500 02/17/23 0300 02/18/23 0258  Weight: 83.9 kg 86.3 kg 89 kg    Examination:  General: No acute distress. Cardiovascular: irregularly irregular - mild tachy Lungs: scattered wheezing Abdomen: Soft, nontender, nondistended Neurological: Alert and oriented 3. Moves all extremities 4 with equal strength. Cranial nerves II through XII grossly intact. Skin: Warm and dry. No rashes or lesions. Extremities: discoloration of distal toes on RLE     Data Reviewed: I have personally reviewed following labs and imaging studies  CBC: Recent Labs  Lab 02/15/23 0605 02/15/23 1534 02/16/23 0324 02/17/23 0350 02/18/23 0515  WBC 60.6* 49.0* 56.9* 84.1* 70.0*  NEUTROABS 7.9* 25.0* 29.8* 51.1* 39.0*  HGB  9.9* 10.3* 9.5* 10.1* 9.1*  HCT 31.2* 32.6* 30.7* 32.3* 29.7*  MCV 97.2 96.7 96.8 97.3 99.7  PLT 57* 57* 53* 54* 42*    Basic Metabolic Panel: Recent Labs  Lab 02/14/23 1413 02/14/23 2041 02/15/23 0605 02/16/23 0324 02/17/23 0350 02/18/23 0515  NA 141  --  138 137 140 140  K 2.8*  --  3.9 3.0* 4.3 3.7  CL 105  --  105 106 110 110  CO2 27  --  24 25 23 23   GLUCOSE 113*  --  116* 127* 119* 109*  BUN 7*  --  6* 5* 9 8  CREATININE 1.05*  --  0.95 1.07* 1.25* 1.20*  CALCIUM 8.7*  --  7.9* 7.7* 8.1* 7.6*  MG  --  1.7 1.6* 2.2  --   --   PHOS  --  2.0* 2.7  --   --   --     GFR: Estimated Creatinine Clearance: 43.6 mL/min (Marcelene Weidemann) (by C-G formula based on SCr of 1.2 mg/dL (H)).  Liver Function Tests: Recent Labs  Lab 02/14/23 2041 02/16/23 0324 02/17/23 0350 02/18/23 0515  AST 15 19 19 21   ALT 11 12 15 16   ALKPHOS 107 101 114 106  BILITOT 0.5 0.7 0.8 0.7  PROT 6.8 5.7* 6.3* 5.5*  ALBUMIN 3.2* 2.6* 2.6* 2.4*    CBG: No results for input(s): "GLUCAP" in the last 168 hours.   Recent Results (from the past  240 hours)  Culture, blood (Routine X 2) w Reflex to ID Panel     Status: None (Preliminary result)   Collection Time: 02/14/23 11:54 PM   Specimen: BLOOD  Result Value Ref Range Status   Specimen Description BLOOD RIGHT ANTECUBITAL  Final   Special Requests   Final    BOTTLES DRAWN AEROBIC AND ANAEROBIC Blood Culture adequate volume   Culture   Final    NO GROWTH 3 DAYS Performed at [facility], [address] [place], [place] [address]    Report Status PENDING  Incomplete  Culture, blood (Routine X 2) w Reflex to ID Panel     Status: None (Preliminary result)   Collection Time: 02/14/23 11:54 PM   Specimen: BLOOD RIGHT HAND  Result Value Ref Range Status   Specimen Description   Final    BLOOD RIGHT HAND Performed at [facility], [address], [place], [place] [address]    Special Requests   Final    BOTTLES DRAWN AEROBIC AND ANAEROBIC Blood  Culture adequate volume Performed at [facility], [address], [place], [place] [address]    Culture   Final    CULTURE REINCUBATED FOR BETTER GROWTH Performed at [facility], [address], [place], [place] [address]    Report Status PENDING  Incomplete  Respiratory (~20 pathogens) panel by PCR     Status: None   Collection Time: 02/15/23 12:10 PM   Specimen: Nasopharyngeal Swab; Respiratory  Result Value Ref Range Status   Adenovirus NOT DETECTED NOT DETECTED Final   Coronavirus 229E NOT DETECTED NOT DETECTED Final    Comment: (NOTE) The Coronavirus on the Respiratory Panel, DOES NOT test for the novel  Coronavirus (2019 nCoV)    Coronavirus HKU1 NOT DETECTED NOT DETECTED Final   Coronavirus NL63 NOT DETECTED NOT DETECTED Final   Coronavirus OC43 NOT DETECTED NOT DETECTED Final   Metapneumovirus NOT DETECTED NOT DETECTED Final   Rhinovirus / Enterovirus NOT DETECTED NOT DETECTED Final   Influenza Octavion Mollenkopf NOT DETECTED NOT DETECTED Final   Influenza B NOT DETECTED NOT DETECTED Final   Parainfluenza Virus 1 NOT DETECTED NOT DETECTED Final   Parainfluenza Virus 2 NOT DETECTED NOT DETECTED Final   Parainfluenza Virus 3 NOT DETECTED NOT DETECTED Final   Parainfluenza Virus 4 NOT DETECTED NOT DETECTED Final   Respiratory Syncytial Virus NOT DETECTED NOT DETECTED Final   Bordetella pertussis NOT DETECTED NOT DETECTED Final   Bordetella Parapertussis NOT DETECTED NOT DETECTED Final   Chlamydophila pneumoniae NOT DETECTED NOT DETECTED Final   Mycoplasma pneumoniae NOT DETECTED NOT DETECTED Final    Comment: Performed at [facility], [address], [place], [place] [address]         Radiology Studies: DG CHEST PORT 1 VIEW Result Date: 02/17/2023 CLINICAL DATA:  Shortness of breath EXAM: PORTABLE CHEST 1 VIEW COMPARISON:  02/14/2023 FINDINGS: Right-sided central venous port tip over the SVC. Stable cardiomediastinal silhouette. Emphysema. No acute airspace disease,  pleural effusion or pneumothorax. IMPRESSION: No active disease. Emphysema. Electronically Signed   By: Jasmine Pang M.D.   On: 02/17/2023 15:29   IR IMAGING GUIDED PORT INSERTION Result Date: 02/17/2023 INDICATION: 77 year old female for Saralyn Willison new diagnosis of leukocytosis concerning for leukemia. She presents for combined bone marrow biopsy and port catheter placement. EXAM: CT GUIDED BONE MARROW ASPIRATION AND CORE BIOPSY IR PORT PLACEMENT Interventional Radiologist:  Sterling Big, MD MEDICATIONS: None. ANESTHESIA/SEDATION: Moderate (conscious) sedation was employed  during this procedure. Clairissa Valvano total of 3.5 milligrams versed and 150 micrograms fentanyl were administered intravenously by the Radiology nurse. The patient's level of consciousness and vital signs were monitored continuously by radiology nursing throughout the procedure under my direct supervision. Total monitored sedation time: 60 minutes FLUOROSCOPY: Radiation exposure index: 11.8 mGy reference air kerma COMPLICATIONS: None immediate. Estimated blood loss: <25 mL PROCEDURE: Informed written consent was obtained from the patient after Traevion Poehler thorough discussion of the procedural risks, benefits and alternatives. All questions were addressed. Maximal Sterile Barrier Technique was utilized including caps, mask, sterile gowns, sterile gloves, sterile drape, hand hygiene and skin antiseptic. Lynkin Saini timeout was performed prior to the initiation of the procedure. BONE MARROW BIOPSY The patient was positioned prone and fluoroscopy was performed of the pelvis to demonstrate the iliac marrow spaces. Maximal barrier sterile technique utilized including caps, mask, sterile gowns, sterile gloves, large sterile drape, hand hygiene, and betadine prep. Under sterile conditions and local anesthesia, an 11 gauge coaxial bone biopsy needle was advanced into the left iliac marrow space. Needle position was confirmed with CT imaging. Initially, bone marrow aspiration was  performed. Next, the 11 gauge outer cannula was utilized to obtain Beckie Viscardi left iliac bone marrow core biopsy. Needle was removed. Hemostasis was obtained with compression. The patient tolerated the procedure well. Samples were prepared with the cytotechnologist. PORT PLACEMENT The patient was repositioned into the supine position. The right neck and chest were sterilely prepped in the standard fashion using chlorhexidine skin prep. Ultrasound demonstrated patency of the right internal jugular vein, and this was documented with an image. Under real-time ultrasound guidance, this vein was accessed with Porshia Blizzard 21 gauge micropuncture needle and image documentation was performed. Nori Poland small dermatotomy was made at the access site with an 11 scalpel. Jaterrius Ricketson 0.018" wire was advanced into the SVC and the access needle exchanged for Sherrel Shafer 10F micropuncture vascular sheath. The 0.018" wire was then removed and Alishah Schulte 0.035" wire advanced into the IVC. An appropriate location for the subcutaneous reservoir was selected below the clavicle and an incision was made through the skin and underlying soft tissues. The subcutaneous tissues were then dissected using Antone Summons combination of blunt and sharp surgical technique and Grayson Pfefferle pocket was formed. Marshia Tropea single lumen power injectable portacatheter (Bard Clear Vue) was then tunneled through the subcutaneous tissues from the pocket to the dermatotomy and the port reservoir placed within the subcutaneous pocket. The venous access site was then serially dilated and Leean Amezcua peel away vascular sheath placed over the wire. The wire was removed and the port catheter advanced into position under fluoroscopic guidance. The catheter tip is positioned in the superior cavoatrial junction. This was documented with Maleny Candy spot image. The portacatheter was then tested and found to flush and aspirate well. The port was flushed with saline followed by 100 units/mL heparinized saline. The pocket was then closed in two layers using first subdermal  inverted interrupted absorbable sutures followed by Kamdon Reisig running subcuticular suture. The epidermis was then sealed with Dermabond. The dermatotomy at the venous access site was also closed with Dermabond. IMPRESSION: 1. Successful left iliac bone marrow aspirate and core biopsy 2. Successful right IJ approach port catheter placement (Bard Clear Vue). The catheter tubing is at the superior cavoatrial junction and the catheter is ready for immediate use. Electronically Signed   By: Malachy Moan M.D.   On: 02/17/2023 13:13   IR BONE MARROW BIOPSY & ASPIRATION Result Date: 02/17/2023 INDICATION: 77 year old female for Rudy Luhmann new diagnosis of  leukocytosis concerning for leukemia. She presents for combined bone marrow biopsy and port catheter placement. EXAM: CT GUIDED BONE MARROW ASPIRATION AND CORE BIOPSY IR PORT PLACEMENT Interventional Radiologist:  Sterling Big, MD MEDICATIONS: None. ANESTHESIA/SEDATION: Moderate (conscious) sedation was employed during this procedure. Jaron Czarnecki total of 3.5 milligrams versed and 150 micrograms fentanyl were administered intravenously by the Radiology nurse. The patient's level of consciousness and vital signs were monitored continuously by radiology nursing throughout the procedure under my direct supervision. Total monitored sedation time: 60 minutes FLUOROSCOPY: Radiation exposure index: 11.8 mGy reference air kerma COMPLICATIONS: None immediate. Estimated blood loss: <25 mL PROCEDURE: Informed written consent was obtained from the patient after Mccrae Speciale thorough discussion of the procedural risks, benefits and alternatives. All questions were addressed. Maximal Sterile Barrier Technique was utilized including caps, mask, sterile gowns, sterile gloves, sterile drape, hand hygiene and skin antiseptic. Beryle Bagsby timeout was performed prior to the initiation of the procedure. BONE MARROW BIOPSY The patient was positioned prone and fluoroscopy was performed of the pelvis to demonstrate the iliac  marrow spaces. Maximal barrier sterile technique utilized including caps, mask, sterile gowns, sterile gloves, large sterile drape, hand hygiene, and betadine prep. Under sterile conditions and local anesthesia, an 11 gauge coaxial bone biopsy needle was advanced into the left iliac marrow space. Needle position was confirmed with CT imaging. Initially, bone marrow aspiration was performed. Next, the 11 gauge outer cannula was utilized to obtain Zetha Kuhar left iliac bone marrow core biopsy. Needle was removed. Hemostasis was obtained with compression. The patient tolerated the procedure well. Samples were prepared with the cytotechnologist. PORT PLACEMENT The patient was repositioned into the supine position. The right neck and chest were sterilely prepped in the standard fashion using chlorhexidine skin prep. Ultrasound demonstrated patency of the right internal jugular vein, and this was documented with an image. Under real-time ultrasound guidance, this vein was accessed with Domenik Trice 21 gauge micropuncture needle and image documentation was performed. Emmanuella Mirante small dermatotomy was made at the access site with an 11 scalpel. Cieanna Stormes 0.018" wire was advanced into the SVC and the access needle exchanged for Marda Breidenbach 16F micropuncture vascular sheath. The 0.018" wire was then removed and Yamilex Borgwardt 0.035" wire advanced into the IVC. An appropriate location for the subcutaneous reservoir was selected below the clavicle and an incision was made through the skin and underlying soft tissues. The subcutaneous tissues were then dissected using Galen Malkowski combination of blunt and sharp surgical technique and Takari Duncombe pocket was formed. Libero Puthoff single lumen power injectable portacatheter (Bard Clear Vue) was then tunneled through the subcutaneous tissues from the pocket to the dermatotomy and the port reservoir placed within the subcutaneous pocket. The venous access site was then serially dilated and Carrson Lightcap peel away vascular sheath placed over the wire. The wire was removed and the port  catheter advanced into position under fluoroscopic guidance. The catheter tip is positioned in the superior cavoatrial junction. This was documented with Briar Sword spot image. The portacatheter was then tested and found to flush and aspirate well. The port was flushed with saline followed by 100 units/mL heparinized saline. The pocket was then closed in two layers using first subdermal inverted interrupted absorbable sutures followed by Frederik Standley running subcuticular suture. The epidermis was then sealed with Dermabond. The dermatotomy at the venous access site was also closed with Dermabond. IMPRESSION: 1. Successful left iliac bone marrow aspirate and core biopsy 2. Successful right IJ approach port catheter placement (Bard Clear Vue). The catheter tubing is at the superior cavoatrial  junction and the catheter is ready for immediate use. Electronically Signed   By: Malachy Moan M.D.   On: 02/17/2023 13:13        Scheduled Meds:  acetaminophen  1,000 mg Oral Q8H   allopurinol  300 mg Oral Daily   Chlorhexidine Gluconate Cloth  6 each Topical Daily   hydroxyurea  2,000 mg Oral Daily   ipratropium-albuterol  3 mL Nebulization Q6H WA   metoprolol tartrate  25 mg Oral BID   pantoprazole  40 mg Oral Daily   sodium chloride flush  3 mL Intravenous Q12H   Continuous Infusions:  sodium chloride 150 mL/hr at 02/18/23 0700   heparin 1,400 Units/hr (02/18/23 0700)     LOS: 4 days    Time spent: over 30 min    Lacretia Nicks, MD [facility]   To contact the attending provider between 7A-7P or the covering provider during after hours 7P-7A, please log into the web site [email or web address] and access using universal [place] password for that web site. If you do not have the password, please call the hospital operator.  02/18/2023, 10:34 AM

## 2023-02-18 NOTE — Progress Notes (Signed)
Daily Progress Note   [name]       Date: 02/18/2023 DOB: 09-18-1945  Age: 77 y.o. MRN#: [id number] Attending Physician: Zigmund Daniel., * Primary Care Physician: Kirstie Peri, MD Admit Date: 02/14/2023  Reason for Consultation/Follow-up: Establishing goals of care  Length of Stay: 4  Current Medications: Scheduled Meds:   acetaminophen  1,000 mg Oral Q8H   allopurinol  300 mg Oral Daily   Chlorhexidine Gluconate Cloth  6 each Topical Daily   hydroxyurea  2,000 mg Oral Daily   ipratropium-albuterol  3 mL Nebulization Q6H WA   metoprolol tartrate  25 mg Oral BID   mometasone-formoterol  2 puff Inhalation BID   pantoprazole  40 mg Oral Daily   sodium chloride flush  3 mL Intravenous Q12H    Continuous Infusions:  sodium chloride 150 mL/hr at 02/18/23 1048   heparin 1,400 Units/hr (02/18/23 0700)   levofloxacin (LEVAQUIN) IV 750 mg (02/18/23 1144)    PRN Meds: acetaminophen **FOLLOWED BY** [START ON 02/21/2023] acetaminophen, albuterol, diazepam, morphine injection, ondansetron (ZOFRAN) IV, polyethylene glycol, prochlorperazine, traMADol  Physical Exam Vitals reviewed.  Constitutional:      Interventions: Nasal cannula in place.  Cardiovascular:     Rate and Rhythm: Normal rate.  Pulmonary:     Effort: Pulmonary effort is normal.  Skin:    General: Skin is warm and dry.  Neurological:     Mental Status: She is alert and oriented to person, place, and time.  Psychiatric:        Mood and Affect: Mood normal.        Behavior: Behavior normal.        Thought Content: Thought content normal.        Judgment: Judgment normal.             Vital Signs: BP (!) 95/51 (BP Location: Right Arm)   Pulse 85   Temp 97.8 F (36.6 C) (Oral)   Resp  18   Ht 5\' 7"  (1.702 m)   Wt 89 kg   LMP 09/01/2016 (LMP Unknown)   SpO2 91%   BMI 30.71 kg/m  SpO2: SpO2: 91 % O2 Device: O2 Device: Room Air O2 Flow Rate: O2 Flow Rate (L/min): 5 L/min    Patient Active Problem List   Diagnosis Date  Noted   AML (acute myeloblastic leukemia) (HCC) 02/15/2023   Acquired thrombophilia (HCC) 02/15/2023   Hyperleukocytosis 02/15/2023   Arterial occlusion, lower extremity (HCC) 02/14/2023   Rectal bleeding 01/13/2023   Hemorrhoids 01/13/2023   HSV (herpes simplex virus) dendritic keratitis 01/02/2023   History of Renal infarction 12/28/2022   Chest pain due to CAD [facility]) 02/09/2017   Glaucoma 11/24/2016   Diverticulitis 10/11/2016   Coronary artery disease involving native coronary artery of native heart without angina pectoris    Chest pain 09/30/2016   Non-ST elevation (NSTEMI) myocardial infarction [facility]) 09/06/2016   Coronary artery disease involving native coronary artery of native heart with unstable angina pectoris (HCC)    Hyperlipidemia LDL goal <70    Hypokalemia    Unstable angina (HCC) 08/31/2016   GERD (gastroesophageal reflux disease) 08/31/2016   COPD (chronic obstructive pulmonary disease) (HCC) 08/31/2016   Tobacco abuse 08/31/2016   Chronic cough 08/31/2016    Palliative Care Assessment & Plan   Patient Profile: 77 y.o. female  with past medical history of CAD with history of PCI, PAD, HFpEF, and COPD admitted on 02/14/2023 with a painful right foot with cyanotic changes and numbness in her toes.   Found to have imaging concerning for occlusion of distal right SFA. Admitted to hospital with vascular following. Oncology consulting given acute leukemia.   Patient faces treatment option decisions, advanced directive decisions, and anticipatory care needs.  Today's Discussion: Patient sitting in bed. She had just ambulated to bedside commode with her daughter's assistance. Patient shares her current pain medications are  managing her right foot pain. She reports feeling very weak. She shares that she was chilled and believes she had a fever yesterday- she is on scheduled tylenol which might mask her temperature. We discuss the many things that could contribute to her feeling weak at this time including fever, hemoglobin level, and leukemia. We discussed the importance of remaining active, sitting up, and walking.  Patient or daughter do not have questions regarding GOC conversation yesterday. I dropped off ACP document for HCPOA for them to review. Told them our chaplain Alvino Chapel would see them on Monday to complete.  Encouraged patient and daughter to cal PMT with any questions or concerns. PMT will continue to follow.  Recommendations/Plan: DNR/DNI Treat the treatable Explore cancer therapy options Complete HCPOA documentation- spiritual care consulted Continued PMT support   Code Status:    Code Status Orders  (From admission, onward)           Start     Ordered   02/17/23 1315  Do not attempt resuscitation (DNR)- Limited -Do Not Intubate (DNI)  (Code Status)  Continuous       Question Answer Comment  If pulseless and not breathing No CPR or chest compressions.   In Pre-Arrest Conditions (Patient Is Breathing and Has A Pulse) Do not intubate. Provide all appropriate non-invasive medical interventions. Avoid ICU transfer unless indicated or required.   Consent: Discussion documented in EHR or advanced directives reviewed      02/17/23 1314         Extensive chart review has been completed prior to seeing the patient including labs, vital signs, imaging, progress/consult notes, orders, medications, and available advance directive documents.  Care plan was discussed with bedside RN and Dr. Lowell Guitar  Time spent: 45 minutes  Thank you for allowing the Palliative Medicine Team to assist in the care of this patient.     Sherryll Burger, NP  Please contact  Palliative Medicine Team phone at  [phone] for questions and concerns.

## 2023-02-18 NOTE — Consult Note (Signed)
PHARMACY - ANTICOAGULATION CONSULT NOTE  Pharmacy Consult for IV Heparin Indication:  Occlusion superficial femoral artery  Patient Measurements: Height: 5\' 7"  (170.2 cm) Weight: 89 kg (196 lb 1.6 oz) IBW/kg (Calculated) : 61.6 Heparin Dosing Weight: 77.3 kg  Labs: Recent Labs    02/16/23 0324 02/17/23 0350 02/17/23 2000 02/18/23 0515  HGB 9.5* 10.1*  --  9.1*  HCT 30.7* 32.3*  --  29.7*  PLT 53* 54*  --  42*  APTT 83* 68* 70*  --   HEPARINUNFRC 1.08* 0.51 0.37 0.38  CREATININE 1.07* 1.25*  --  1.20*    Estimated Creatinine Clearance: 43.6 mL/min (A) (by C-G formula based on SCr of 1.2 mg/dL (H)).  Medical History: Past Medical History:  Diagnosis Date   Asthma    COPD (chronic obstructive pulmonary disease) (HCC)    Coronary artery disease    Cough    " ALL MY LIFE "   Dyspnea    GERD (gastroesophageal reflux disease)    History of blood clots    patient had a blood clot in her kidney 01/03/2023.  pt is on Eliquis   Hypertension    NSTEMI (non-ST elevated myocardial infarction) (HCC) 09/01/2016   DES to Cx/OM bifurcation   Unstable angina (HCC) 08/2016   Medications:  Apixaban 5 mg BID prior to admission, last dose 12/17 0700  Assessment: 77 y/o F with medical history as above and including history of blood clots on apixaban presenting with right leg pain with swelling. 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 Pharmacy consulted to initiate and manage heparin infusion for occlusion superficial femoral artery. Previously held for bone marrow biopsy and port placement procedure but has since been restarted at previous rate.  Heparin level therapeutic (0.38) on infusion at 1400 units/hr. No issues with infusions or bleeding noted per RN. Hgb 9.1, plt have decreased to 42 - oncology following.  Goal of Therapy:  Heparin level 0.3-0.7 units/ml Monitor platelets by anticoagulation protocol: Yes    Plan:  Continue heparin infusion at 1400 units/hr Daily heparin level and CBC  Nicole Kindred, PharmD PGY1 Pharmacy Resident 02/18/2023 8:39 AM

## 2023-02-18 NOTE — Plan of Care (Signed)
  Problem: Clinical Measurements: Goal: Ability to maintain clinical measurements within normal limits will improve Outcome: Progressing   Problem: Health Behavior/Discharge Planning: Goal: Ability to manage health-related needs will improve Outcome: Progressing   Problem: Education: Goal: Knowledge of General Education information will improve Description: Including pain rating scale, medication(s)/side effects and non-pharmacologic comfort measures Outcome: Progressing   Problem: Nutrition: Goal: Adequate nutrition will be maintained Outcome: Progressing   Problem: Coping: Goal: Level of anxiety will decrease Outcome: Progressing   Problem: Safety: Goal: Ability to remain free from injury will improve Outcome: Progressing   Problem: Skin Integrity: Goal: Risk for impaired skin integrity will decrease Outcome: Progressing   Problem: Skin Integrity: Goal: Risk for impaired skin integrity will decrease Outcome: Progressing

## 2023-02-18 NOTE — Progress Notes (Signed)
Patient is getting IV heparin infusion at 39ml/hour, no any signs of bleeding noted  Pt had PRN pain medication; she requested for Tramadol for her pain. Refused for IV morphine for severe pain.

## 2023-02-19 ENCOUNTER — Inpatient Hospital Stay (HOSPITAL_COMMUNITY): Payer: Medicare Other

## 2023-02-19 DIAGNOSIS — I480 Paroxysmal atrial fibrillation: Secondary | ICD-10-CM

## 2023-02-19 DIAGNOSIS — K219 Gastro-esophageal reflux disease without esophagitis: Secondary | ICD-10-CM

## 2023-02-19 DIAGNOSIS — C9200 Acute myeloblastic leukemia, not having achieved remission: Secondary | ICD-10-CM

## 2023-02-19 DIAGNOSIS — J439 Emphysema, unspecified: Secondary | ICD-10-CM

## 2023-02-19 DIAGNOSIS — I5033 Acute on chronic diastolic (congestive) heart failure: Secondary | ICD-10-CM

## 2023-02-19 DIAGNOSIS — I70209 Unspecified atherosclerosis of native arteries of extremities, unspecified extremity: Secondary | ICD-10-CM | POA: Diagnosis not present

## 2023-02-19 DIAGNOSIS — I25110 Atherosclerotic heart disease of native coronary artery with unstable angina pectoris: Secondary | ICD-10-CM

## 2023-02-19 DIAGNOSIS — I10 Essential (primary) hypertension: Secondary | ICD-10-CM

## 2023-02-19 DIAGNOSIS — J189 Pneumonia, unspecified organism: Secondary | ICD-10-CM | POA: Insufficient documentation

## 2023-02-19 DIAGNOSIS — I743 Embolism and thrombosis of arteries of the lower extremities: Secondary | ICD-10-CM | POA: Diagnosis not present

## 2023-02-19 LAB — CBC WITH DIFFERENTIAL/PLATELET
Abs Immature Granulocytes: 0.26 10*3/uL — ABNORMAL HIGH (ref 0.00–0.07)
Basophils Absolute: 0.1 10*3/uL (ref 0.0–0.1)
Basophils Relative: 0 %
Eosinophils Absolute: 0.2 10*3/uL (ref 0.0–0.5)
Eosinophils Relative: 0 %
HCT: 28.6 % — ABNORMAL LOW (ref 36.0–46.0)
Hemoglobin: 8.8 g/dL — ABNORMAL LOW (ref 12.0–15.0)
Immature Granulocytes: 0 %
Lymphocytes Relative: 10 %
Lymphs Abs: 6.4 10*3/uL — ABNORMAL HIGH (ref 0.7–4.0)
MCH: 30.6 pg (ref 26.0–34.0)
MCHC: 30.8 g/dL (ref 30.0–36.0)
MCV: 99.3 fL (ref 80.0–100.0)
Monocytes Absolute: 10.8 10*3/uL — ABNORMAL HIGH (ref 0.1–1.0)
Monocytes Relative: 17 %
Neutro Abs: 45 10*3/uL — ABNORMAL HIGH (ref 1.7–7.7)
Neutrophils Relative %: 73 %
Platelets: 38 10*3/uL — ABNORMAL LOW (ref 150–400)
RBC: 2.88 MIL/uL — ABNORMAL LOW (ref 3.87–5.11)
RDW: 20.7 % — ABNORMAL HIGH (ref 11.5–15.5)
WBC: 62.8 10*3/uL (ref 4.0–10.5)
nRBC: 0.1 % (ref 0.0–0.2)

## 2023-02-19 LAB — COMPREHENSIVE METABOLIC PANEL
ALT: 20 U/L (ref 0–44)
AST: 28 U/L (ref 15–41)
Albumin: 2.3 g/dL — ABNORMAL LOW (ref 3.5–5.0)
Alkaline Phosphatase: 116 U/L (ref 38–126)
Anion gap: 9 (ref 5–15)
BUN: 9 mg/dL (ref 8–23)
CO2: 21 mmol/L — ABNORMAL LOW (ref 22–32)
Calcium: 8 mg/dL — ABNORMAL LOW (ref 8.9–10.3)
Chloride: 110 mmol/L (ref 98–111)
Creatinine, Ser: 0.99 mg/dL (ref 0.44–1.00)
GFR, Estimated: 58 mL/min — ABNORMAL LOW (ref >60)
Glucose, Bld: 133 mg/dL — ABNORMAL HIGH (ref 70–99)
Potassium: 4.2 mmol/L (ref 3.5–5.1)
Sodium: 140 mmol/L (ref 135–145)
Total Bilirubin: 0.9 mg/dL (ref <1.2)
Total Protein: 5.5 g/dL — ABNORMAL LOW (ref 6.5–8.1)

## 2023-02-19 LAB — TYPE AND SCREEN
ABO/RH(D): O POS
Antibody Screen: NEGATIVE

## 2023-02-19 LAB — HEPARIN LEVEL (UNFRACTIONATED): Heparin Unfractionated: 0.3 [IU]/mL (ref 0.30–0.70)

## 2023-02-19 LAB — MRSA NEXT GEN BY PCR, NASAL: MRSA by PCR Next Gen: NOT DETECTED

## 2023-02-19 MED ORDER — FUROSEMIDE 10 MG/ML IJ SOLN
40.0000 mg | Freq: Once | INTRAMUSCULAR | Status: AC
  Administered 2023-02-19: 40 mg via INTRAVENOUS
  Filled 2023-02-19: qty 4

## 2023-02-19 MED ORDER — FUROSEMIDE 10 MG/ML IJ SOLN
60.0000 mg | Freq: Two times a day (BID) | INTRAMUSCULAR | Status: DC
Start: 2023-02-19 — End: 2023-02-20
  Administered 2023-02-19 – 2023-02-20 (×2): 60 mg via INTRAVENOUS
  Filled 2023-02-19 (×2): qty 6

## 2023-02-19 MED ORDER — BUDESONIDE 0.25 MG/2ML IN SUSP
0.2500 mg | Freq: Two times a day (BID) | RESPIRATORY_TRACT | Status: DC
Start: 2023-02-19 — End: 2023-02-21
  Administered 2023-02-19 – 2023-02-21 (×4): 0.25 mg via RESPIRATORY_TRACT
  Filled 2023-02-19 (×4): qty 2

## 2023-02-19 MED ORDER — IPRATROPIUM-ALBUTEROL 0.5-2.5 (3) MG/3ML IN SOLN
3.0000 mL | Freq: Two times a day (BID) | RESPIRATORY_TRACT | Status: DC
Start: 2023-02-19 — End: 2023-02-21
  Administered 2023-02-19 – 2023-02-21 (×4): 3 mL via RESPIRATORY_TRACT
  Filled 2023-02-19 (×4): qty 3

## 2023-02-19 MED ORDER — LEVOFLOXACIN 750 MG PO TABS
750.0000 mg | ORAL_TABLET | Freq: Every day | ORAL | Status: DC
  Administered 2023-02-19 – 2023-02-21 (×3): 750 mg via ORAL
  Filled 2023-02-19 (×3): qty 1

## 2023-02-19 MED ORDER — LIDOCAINE 5 % EX PTCH
1.0000 | MEDICATED_PATCH | CUTANEOUS | Status: DC
  Administered 2023-02-19 – 2023-02-21 (×3): 1 via TRANSDERMAL
  Filled 2023-02-19 (×3): qty 1

## 2023-02-19 MED ORDER — IPRATROPIUM-ALBUTEROL 0.5-2.5 (3) MG/3ML IN SOLN
3.0000 mL | RESPIRATORY_TRACT | Status: DC | PRN
  Administered 2023-02-19 – 2023-02-20 (×3): 3 mL via RESPIRATORY_TRACT
  Filled 2023-02-19 (×3): qty 3

## 2023-02-19 MED ORDER — IPRATROPIUM-ALBUTEROL 0.5-2.5 (3) MG/3ML IN SOLN
3.0000 mL | Freq: Four times a day (QID) | RESPIRATORY_TRACT | Status: DC
  Administered 2023-02-19: 3 mL via RESPIRATORY_TRACT
  Filled 2023-02-19: qty 3

## 2023-02-19 NOTE — Plan of Care (Signed)
  Problem: Education: Goal: Knowledge of General Education information will improve Description: Including pain rating scale, medication(s)/side effects and non-pharmacologic comfort measures Outcome: Progressing   Problem: Health Behavior/Discharge Planning: Goal: Ability to manage health-related needs will improve Outcome: Progressing   Problem: Clinical Measurements: Goal: Ability to maintain clinical measurements within normal limits will improve Outcome: Progressing Goal: Will remain free from infection Outcome: Progressing Goal: Diagnostic test results will improve Outcome: Progressing Goal: Respiratory complications will improve Outcome: Progressing Goal: Cardiovascular complication will be avoided Outcome: Progressing   Problem: Pain Management: Goal: General experience of comfort will improve Outcome: Progressing   Problem: Safety: Goal: Ability to remain free from injury will improve Outcome: Progressing   Problem: Skin Integrity: Goal: Risk for impaired skin integrity will decrease Outcome: Progressing

## 2023-02-19 NOTE — Assessment & Plan Note (Addendum)
SP bone marrow pending pathology.  SP right internal jugular vein port catheter placement.  Thrombocytopenia and anemia (leukemia related).   Wbc is 62, Hgb 8.8 and plt 38   Patient on hydroxyurea 2000 mg daily.  Continue to follow up cell counts.

## 2023-02-19 NOTE — Assessment & Plan Note (Signed)
Renal function stable with serum cr at 0,9 with K at 4.2 and serum bicarbonate at 21.  Na 140   Plan to continue close follow up on renal function and electrolytes.  Adding one dose of loop diuretic today for signs of hypervolemia.

## 2023-02-19 NOTE — Progress Notes (Signed)
Follow up chest radiograph personally reviewed noted acute cardiogenic pulmonary edema, will continue diuresis with furosemide 60 mg IV bid.  Continue oxymetry monitoring and supplemental 02 per [place] to keep 02 saturation 92% or greater.

## 2023-02-19 NOTE — Plan of Care (Signed)
  Problem: Clinical Measurements: Goal: Will remain free from infection Outcome: Progressing   Problem: Clinical Measurements: Goal: Ability to maintain clinical measurements within normal limits will improve Outcome: Progressing   Problem: Clinical Measurements: Goal: Respiratory complications will improve Outcome: Progressing   Problem: Clinical Measurements: Goal: Diagnostic test results will improve Outcome: Progressing   Problem: Nutrition: Goal: Adequate nutrition will be maintained Outcome: Progressing   Problem: Coping: Goal: Level of anxiety will decrease Outcome: Progressing   Problem: Elimination: Goal: Will not experience complications related to urinary retention Outcome: Progressing   Problem: Pain Management: Goal: General experience of comfort will improve Outcome: Progressing   Problem: Skin Integrity: Goal: Risk for impaired skin integrity will decrease Outcome: Progressing

## 2023-02-19 NOTE — Assessment & Plan Note (Signed)
Echocardiogram with preserved LV systolic function EF 60 to 65%, moderate LVH, RV systolic function preserved, normal left and right atrium, no significant valvular disease.   Patient with signs of volume overload this morning.  Will add furosemide 40 mg x1 dose.  Follow up on chest radiograph.   Acute hypoxemic respiratory failure. Patient with increased work of breathing today, 02 saturation 88% on room air.  Follow up response to diuresis.  Continue oxymetry monitoring and supplemental 02 to keep 02 saturation 92% or greater.

## 2023-02-19 NOTE — Progress Notes (Signed)
Patient was having labored breathing, her RR was >20, SPO2 AT 90s, continue on Oxygen via [place] at 6ltrs/min. Informed to the MD about the patient's condition,  medications given as per the order. Heparin is continue at 60ml/hour, no any signs of bleeding noted,   02/19/23 0929  Vitals  Temp 97.6 F (36.4 C)  Temp Source Oral  BP (!) 145/96  MAP (mmHg) 111  BP Location Right Arm  BP Method Automatic  Patient Position (if appropriate) Lying  Pulse Rate (!) 129  Pulse Rate Source Monitor  ECG Heart Rate 83  Resp (!) 29  Level of Consciousness  Level of Consciousness Alert  MEWS COLOR  MEWS Score Color Yellow  Oxygen Therapy  SpO2 (!) 88 %  O2 Device Nasal Cannula  O2 Flow Rate (L/min) 6 L/min  Pain Assessment  Pain Scale 0-10  Pain Score 0  MEWS Score  MEWS Temp 0  MEWS Systolic 0  MEWS Pulse 0  MEWS RR 2  MEWS LOC 0  MEWS Score 2  Provider Notification  Provider Name/Title Dr Ella Jubilee  Date Provider Notified 02/19/23  Time Provider Notified [phone]  Method of Notification  (secure chat)  Notification Reason Other (Comment) (SOB, increased work of breathing)  Provider response At bedside  Date of Provider Response 02/19/23  Time of Provider Response [phone]

## 2023-02-19 NOTE — Progress Notes (Addendum)
Progress Note   Patient: Lisa [name] IEP:[id number] DOB: [birth year] DOA: 02/14/2023     5 DOS: the patient was seen and examined on 02/19/2023   Brief hospital course: Lisa [name] was admitted to the hospital with the working diagnosis of right distal SFA occlusion.   78 y.o. 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  Reports that she developed sudden onset of right foot pain esp involving middle third digits 1.5 weeks ago with associated cyanotic changes in her distal toes and numbness in the toes as well.     Vascular surgery has been consulted with plans for possible angiography on 12/23.  Noted to have acute leukemia and oncology has been consulted, now sp bone marrow biopsy, pending results.   12/22 increased work of breathing, added furosemide x1 dose.    Assessment and Plan: * Arterial occlusion, lower extremity (HCC) Acute limb ischemia.   Possible angiography tomorrow. Right foot pain is controlled with analgesics, she has not been ambulatory due to generalized weakness.  Patient will need platelet transfusion prior to procedure, since platelet count less than 50, 000.  Continue anticoagulation with heparin drip.  No antiplatelet therapy due to thrombocytopenia.  Stain intolerant.   AML (acute myeloblastic leukemia) (HCC) SP bone marrow pending pathology.  SP right internal jugular vein port catheter placement.  Thrombocytopenia and anemia (leukemia related).   Wbc is 62, Hgb 8.8 and plt 38   Patient on hydroxyurea 2000 mg daily.  Continue to follow up cell counts.   Acute on chronic diastolic CHF (congestive heart failure) (HCC) Echocardiogram with preserved LV systolic function EF 60 to 65%, moderate LVH, RV systolic function  preserved, normal left and right atrium, no significant valvular disease.   Patient with signs of volume overload this morning.  Will add furosemide 40 mg x1 dose.  Follow up on chest radiograph.   Acute hypoxemic respiratory failure. Patient with increased work of breathing today, 02 saturation 88% on room air.  Follow up response to diuresis.  Continue oxymetry monitoring and supplemental 02 to keep 02 saturation 92% or greater.   Paroxysmal atrial fibrillation (HCC) Telemetry with sinus rhythm with rate 75 bpm, range.  Plan to continue rate control with metoprolol.  Anticoagulation with IV heparin Continue telemetry monitoring,   COPD (chronic obstructive pulmonary disease) (HCC) Possible bilateral pneumonia at the lower lobes.  Patient has been on antibiotic therapy with levofloxacin for community acquired pneumonia. (Sepsis ruled out)  COPD exacerbation,   Plan to continue bronchodilator therapy, change to scheduled q 6 hrs and as needed q 2 hrs Change dulera for budesonide for inhaled corticosteroids Add incentive spirometer and flutter valve.  Hold on systemic steroids for now.  Continue supplemental 02 per [place]   Essential hypertension Continue blood pressure control with metoprolol Hypotension has resolved.   Coronary artery disease involving native coronary artery of native heart with unstable angina pectoris (HCC) No acute coronary syndrome.   GERD (gastroesophageal reflux disease) Continue with pantoprazole.   Hypokalemia Renal function stable with serum cr at 0,9 with K at 4.2 and serum bicarbonate at 21.  Na 140   Plan to continue close follow up on renal function and electrolytes.  Adding one dose of loop diuretic today for signs of hypervolemia.       Subjective:  patient with increased work of breathing and dyspnea, positive wheezing, very weak and deconditioned, has not been ambulating   Physical Exam: Vitals:   02/19/23 0355 02/19/23 0839 02/19/23  0929 02/19/23 0933  BP: 122/65  (!) 145/96 (!) 145/96  Pulse: 73  (!) 129 86  Resp: 19  (!) 29   Temp: 97.9 F (36.6 C)  97.6 F (36.4 C)   TempSrc: Oral  Oral   SpO2: 96% 100% (!) 88%   Weight: 95 kg     Height:       Neurology awake and alert ENT with mild pallor Cardiovascular with S1 and S2 present and regular with no gallops, rubs or murmurs Respiratory with bilateral wheezing and rhonchi, rales at bases, increased work of breathing with accessory muscle use Abdomen with no distention  No lower extremity edema  Right foot 1 to 4 distal toes with cyanosis and decreased local temperature, decreased dorsal pulses on the right foot.     Data Reviewed:    Family Communication: I spoke with patient's daughter at the bedside, we talked in detail about patient's condition, plan of care and prognosis and all questions were addressed.   Disposition: Status is: Inpatient Remains inpatient appropriate because: IV  heparin, vascular procedure   Planned Discharge Destination: Home    Author: Coralie Keens, MD 02/19/2023 9:52 AM  For on call review [email or web address].

## 2023-02-19 NOTE — Assessment & Plan Note (Signed)
Telemetry with sinus rhythm with rate 75 bpm, range.  Plan to continue rate control with metoprolol.  Anticoagulation with IV heparin Continue telemetry monitoring,

## 2023-02-19 NOTE — Progress Notes (Addendum)
Daily Progress Note   [name]       Date: 02/19/2023 DOB: 08-21-1945  Age: 77 y.o. MRN#: [id number] Attending Physician: Coralie Keens Primary Care Physician: Kirstie Peri, MD Admit Date: 02/14/2023  Reason for Consultation/Follow-up: Establishing goals of care  Length of Stay: 5  Current Medications: Scheduled Meds:   acetaminophen  1,000 mg Oral Q8H   allopurinol  300 mg Oral Daily   Chlorhexidine Gluconate Cloth  6 each Topical Daily   hydroxyurea  2,000 mg Oral Daily   metoprolol tartrate  25 mg Oral BID   mometasone-formoterol  2 puff Inhalation BID   pantoprazole  40 mg Oral Daily   sodium chloride flush  3 mL Intravenous Q12H    Continuous Infusions:  sodium chloride 150 mL/hr at 02/19/23 0439   heparin 1,400 Units/hr (02/18/23 1820)   levofloxacin (LEVAQUIN) IV 750 mg (02/18/23 1144)    PRN Meds: acetaminophen **FOLLOWED BY** [START ON 02/21/2023] acetaminophen, diazepam, ipratropium-albuterol, morphine injection, ondansetron (ZOFRAN) IV, polyethylene glycol, prochlorperazine, traMADol  Physical Exam Vitals reviewed.  Constitutional:      General: She is sleeping.     Appearance: She is ill-appearing.     Interventions: Nasal cannula in place.     Comments: 5L  Cardiovascular:     Rate and Rhythm: Normal rate.  Pulmonary:     Comments: Shallow breathing Skin:    General: Skin is warm and dry.  Neurological:     Mental Status: She is oriented to person, place, and time and easily aroused.  Psychiatric:        Mood and Affect: Mood normal.        Behavior: Behavior normal.        Thought Content: Thought content normal.        Judgment: Judgment normal.             Vital Signs: BP 122/65 (BP Location: Right Arm)   Pulse  73   Temp 97.9 F (36.6 C) (Oral)   Resp 19   Ht 5\' 7"  (1.702 m)   Wt 95 kg   LMP 09/01/2016 (LMP Unknown)   SpO2 100%   BMI 32.80 kg/m  SpO2: SpO2: 100 % O2 Device: O2 Device: Nasal Cannula O2 Flow Rate: O2 Flow Rate (L/min): 5 L/min  Patient Active Problem List   Diagnosis Date Noted   AML (acute myeloblastic leukemia) (HCC) 02/15/2023   Acquired thrombophilia (HCC) 02/15/2023   Hyperleukocytosis 02/15/2023   Arterial occlusion, lower extremity (HCC) 02/14/2023   Rectal bleeding 01/13/2023   Hemorrhoids 01/13/2023   HSV (herpes simplex virus) dendritic keratitis 01/02/2023   History of Renal infarction 12/28/2022   Chest pain due to CAD [facility]) 02/09/2017   Glaucoma 11/24/2016   Diverticulitis 10/11/2016   Coronary artery disease involving native coronary artery of native heart without angina pectoris    Chest pain 09/30/2016   Non-ST elevation (NSTEMI) myocardial infarction (HCC) 09/06/2016   Coronary artery disease involving native coronary artery of native heart with unstable angina pectoris (HCC)    Hyperlipidemia LDL goal <70    Hypokalemia    Unstable angina (HCC) 08/31/2016   GERD (gastroesophageal reflux disease) 08/31/2016   COPD (chronic obstructive pulmonary disease) (HCC) 08/31/2016   Tobacco abuse 08/31/2016   Chronic cough 08/31/2016    Palliative Care Assessment & Plan   Patient Profile: 77 y.o. female  with past medical history of CAD with history of PCI, PAD, HFpEF, and COPD admitted on 02/14/2023 with a painful right foot with cyanotic changes and numbness in her toes.   Found to have imaging concerning for occlusion of distal right SFA. Admitted to hospital with vascular following. Oncology consulting given acute leukemia.   Patient faces treatment option decisions, advanced directive decisions, and anticipatory care needs.  Today's Discussion: Update received from nursing. Patient had a change in respiratory status last night she is now on IV  antibiotics. Patient resting in bed-- she looks less comfortable than yesterday. Daughter Lisa [name] at bedside. Patient reports she is very weak this morning. She had a difficult night. She suddenly felt very short of breath she had breathing treatments and high flow oxygen which improved her breathing. Her breathing is shallow and we discuss the importance of breathing deeper. She understands. She shares her pain is being managed well with her tylenol and prn tramadol.   Remind patient and her daughter that our chaplain Alvino Chapel would see them on Monday to complete.  Encouraged patient and daughter to cal PMT with any questions or concerns. PMT will continue to follow.  1400: Daughter requested PMT visit. We discussed the patient's AML at length. They are considering postponing the procedure to see if the patient's respiratory status responds the next day or two. They feel they will be able to make better decisions once they have the bmbx results and available options for therapy.  We discussed the importance of the patient considering her quality of life and what would be acceptable to her in the future when making decisions regarding goals of care. Encouraged the patient and her daughters to continue discussions regarding goals of car.e  Recommendations/Plan: DNR/DNI Treat the treatable while exploring cancer therapy options Complete HCPOA documentation- spiritual care consulted Continued PMT support   Code Status:    Code Status Orders  (From admission, onward)           Start     Ordered   02/17/23 1315  Do not attempt resuscitation (DNR)- Limited -Do Not Intubate (DNI)  (Code Status)  Continuous       Question Answer Comment  If pulseless and not breathing No CPR or chest compressions.   In Pre-Arrest Conditions (Patient Is Breathing and Has A Pulse) Do not intubate. Provide all appropriate non-invasive medical interventions. Avoid ICU transfer unless indicated or required.  Consent:  Discussion documented in EHR or advanced directives reviewed      02/17/23 1314         Extensive chart review has been completed prior to seeing the patient including labs, vital signs, imaging, progress/consult notes, orders, medications, and available advance directive documents.  Care plan was discussed with bedside RN   Time spent: 50 minutes  Thank you for allowing the Palliative Medicine Team to assist in the care of this patient.     Sherryll Burger, NP  Please contact Palliative Medicine Team phone at [phone] for questions and concerns.

## 2023-02-19 NOTE — Assessment & Plan Note (Signed)
Continue blood pressure control with metoprolol Hypotension has resolved.

## 2023-02-19 NOTE — Consult Note (Signed)
PHARMACY - ANTICOAGULATION CONSULT NOTE  Pharmacy Consult for IV Heparin Indication:  Occlusion superficial femoral artery  Patient Measurements: Height: 5\' 7"  (170.2 cm) Weight: 95 kg (209 lb 7 oz) IBW/kg (Calculated) : 61.6 Heparin Dosing Weight: 77.3 kg  Labs: Recent Labs    02/17/23 0350 02/17/23 2000 02/18/23 0515 02/19/23 0500  HGB 10.1*  --  9.1* 8.8*  HCT 32.3*  --  29.7* 28.6*  PLT 54*  --  42* 38*  APTT 68* 70*  --   --   HEPARINUNFRC 0.51 0.37 0.38 0.30  CREATININE 1.25*  --  1.20* 0.99    Estimated Creatinine Clearance: 54.6 mL/min (by C-G formula based on SCr of 0.99 mg/dL).  Medical History: Past Medical History:  Diagnosis Date   Asthma    COPD (chronic obstructive pulmonary disease) (HCC)    Coronary artery disease    Cough    " ALL MY LIFE "   Dyspnea    GERD (gastroesophageal reflux disease)    History of blood clots    patient had a blood clot in her kidney 01/03/2023.  pt is on Eliquis   Hypertension    NSTEMI (non-ST elevated myocardial infarction) (HCC) 09/01/2016   DES to Cx/OM bifurcation   Unstable angina (HCC) 08/2016   Medications:  Apixaban 5 mg BID prior to admission, last dose 12/17 0700  Assessment: 77 y/o F with medical history as above and including history of blood clots on apixaban presenting with right leg pain with swelling. 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 Pharmacy consulted to initiate and manage heparin infusion for occlusion superficial femoral artery. Previously held heparin for bone marrow biopsy and port placement procedure but has since been restarted at previous rate.  Heparin level at lower end of therapeutic range (0.30) on infusion at 1400 units/hr. Level has been trending down as renal function is improving. Will increase rate slightly. No issues with infusions or bleeding noted per RN. Hgb 8.8, plt 38 - both are trending down, oncology  following.  Anticipate angiography and possible intervention tomorrow. Will likely require platelet transfusion prior to procedure  Goal of Therapy:  Heparin level 0.3-0.7 units/ml Monitor platelets by anticoagulation protocol: Yes   Plan:  Increase heparin infusion to 1450 units/hr Daily heparin level and CBC Monitor s/sx of bleeding  Nicole Kindred, PharmD PGY1 Pharmacy Resident 02/19/2023 11:02 AM

## 2023-02-19 NOTE — Assessment & Plan Note (Signed)
Continue with pantoprazole/.  

## 2023-02-19 NOTE — Assessment & Plan Note (Addendum)
Acute limb ischemia.   Possible angiography tomorrow. Right foot pain is controlled with analgesics, she has not been ambulatory due to generalized weakness.  Patient will need platelet transfusion prior to procedure, since platelet count less than 50, 000.  Continue anticoagulation with heparin drip.  No antiplatelet therapy due to thrombocytopenia.  Stain intolerant.

## 2023-02-19 NOTE — Progress Notes (Addendum)
  Progress Note    02/19/2023 9:03 AM * No surgery found *  Subjective:  rest pain better controlled with Tramadol   Vitals:   02/19/23 0355 02/19/23 0839  BP: 122/65   Pulse: 73   Resp: 19   Temp: 97.9 F (36.6 C)   SpO2: 96% 100%   Physical Exam: Lungs:  non labored Extremities:  PT signal R ankle by doppler; motor and sensation intact Neurologic: A&O  CBC    Component Value Date/Time   WBC 62.8 (HH) 02/19/2023 0500   RBC 2.88 (L) 02/19/2023 0500   HGB 8.8 (L) 02/19/2023 0500   HCT 28.6 (L) 02/19/2023 0500   PLT 38 (L) 02/19/2023 0500   MCV 99.3 02/19/2023 0500   MCH 30.6 02/19/2023 0500   MCHC 30.8 02/19/2023 0500   RDW 20.7 (H) 02/19/2023 0500   LYMPHSABS 6.4 (H) 02/19/2023 0500   MONOABS 10.8 (H) 02/19/2023 0500   EOSABS 0.2 02/19/2023 0500   BASOSABS 0.1 02/19/2023 0500    BMET    Component Value Date/Time   NA 140 02/19/2023 0500   K 4.2 02/19/2023 0500   CL 110 02/19/2023 0500   CO2 21 (L) 02/19/2023 0500   GLUCOSE 133 (H) 02/19/2023 0500   BUN 9 02/19/2023 0500   CREATININE 0.99 02/19/2023 0500   CALCIUM 8.0 (L) 02/19/2023 0500   GFRNONAA 58 (L) 02/19/2023 0500   GFRAA >60 02/24/2018 1937    INR    Component Value Date/Time   INR 1.5 (H) 02/14/2023 2100     Intake/Output Summary (Last 24 hours) at 02/19/2023 0903 Last data filed at 02/19/2023 0439 Gross per 24 hour  Intake 4653.47 ml  Output --  Net 4653.47 ml     Assessment/Plan:  77 y.o. female with SFA thrombus  Rest pain in R foot controlled with Tramadol.  Toe tips are demarcating.  Motor and sensation remains intact.  We again discussed angiography and possible intervention tomorrow and patient is agreeable to proceed.  She will likely require platelet transfusion at least before the procedure.  Continue IV heparin.  Check type and screen.  Keep NPO after midnight.  Consent ordered   Emilie Rutter, PA-C [facility] [phone] 02/19/2023 9:03  AM  I agree with the above.  Have seen and evaluated the patient.  I think she would benefit from angiography, however she is having significant issues with her respiratory status.  I do not think she could sit on angiogram table right now, however we will continue to monitor her and if she improves we will plan for angiography tomorrow.  She will likely need platelet transfusion around the time of her procedure.  Durene Cal

## 2023-02-19 NOTE — Progress Notes (Signed)
Patient is hesitant to sign the consent for the aortogram scheduled in the morning, MD notified. We'll continue to monitor.

## 2023-02-19 NOTE — Assessment & Plan Note (Signed)
No acute coronary syndrome.  

## 2023-02-19 NOTE — Assessment & Plan Note (Addendum)
Possible bilateral pneumonia at the lower lobes.  Patient has been on antibiotic therapy with levofloxacin for community acquired pneumonia. (Sepsis ruled out)  COPD exacerbation,   Plan to continue bronchodilator therapy, change to scheduled q 6 hrs and as needed q 2 hrs Change dulera for budesonide for inhaled corticosteroids Add incentive spirometer and flutter valve.  Hold on systemic steroids for now.  Continue supplemental 02 per [place]

## 2023-02-20 ENCOUNTER — Inpatient Hospital Stay (HOSPITAL_COMMUNITY): Payer: Medicare Other

## 2023-02-20 ENCOUNTER — Encounter (HOSPITAL_COMMUNITY)
Admission: EM | Disposition: A | Discharge status: Other Acute Inpatient Hospital | Payer: Self-pay | Source: Home / Self Care | Attending: Family Medicine

## 2023-02-20 ENCOUNTER — Encounter (HOSPITAL_COMMUNITY): Payer: Self-pay | Admitting: Hematology and Oncology

## 2023-02-20 ENCOUNTER — Ambulatory Visit (HOSPITAL_COMMUNITY): Admission: RE | Admit: 2023-02-20 | Payer: Medicare Other | Source: Home / Self Care | Admitting: Vascular Surgery

## 2023-02-20 DIAGNOSIS — I70209 Unspecified atherosclerosis of native arteries of extremities, unspecified extremity: Secondary | ICD-10-CM | POA: Diagnosis not present

## 2023-02-20 DIAGNOSIS — I743 Embolism and thrombosis of arteries of the lower extremities: Secondary | ICD-10-CM | POA: Diagnosis not present

## 2023-02-20 LAB — COMPREHENSIVE METABOLIC PANEL
ALT: 20 U/L (ref 0–44)
AST: 23 U/L (ref 15–41)
Albumin: 2.2 g/dL — ABNORMAL LOW (ref 3.5–5.0)
Alkaline Phosphatase: 125 U/L (ref 38–126)
Anion gap: 11 (ref 5–15)
BUN: 13 mg/dL (ref 8–23)
CO2: 23 mmol/L (ref 22–32)
Calcium: 8 mg/dL — ABNORMAL LOW (ref 8.9–10.3)
Chloride: 105 mmol/L (ref 98–111)
Creatinine, Ser: 1.27 mg/dL — ABNORMAL HIGH (ref 0.44–1.00)
GFR, Estimated: 43 mL/min — ABNORMAL LOW (ref >60)
Glucose, Bld: 137 mg/dL — ABNORMAL HIGH (ref 70–99)
Potassium: 3.1 mmol/L — ABNORMAL LOW (ref 3.5–5.1)
Sodium: 139 mmol/L (ref 135–145)
Total Bilirubin: 1 mg/dL (ref <1.2)
Total Protein: 5.5 g/dL — ABNORMAL LOW (ref 6.5–8.1)

## 2023-02-20 LAB — HEPARIN LEVEL (UNFRACTIONATED): Heparin Unfractionated: 0.26 [IU]/mL — ABNORMAL LOW (ref 0.30–0.70)

## 2023-02-20 LAB — CBC WITH DIFFERENTIAL/PLATELET
Abs Immature Granulocytes: 0.51 10*3/uL — ABNORMAL HIGH (ref 0.00–0.07)
Basophils Absolute: 0.1 10*3/uL (ref 0.0–0.1)
Basophils Relative: 0 %
Eosinophils Absolute: 0.2 10*3/uL (ref 0.0–0.5)
Eosinophils Relative: 0 %
HCT: 27.4 % — ABNORMAL LOW (ref 36.0–46.0)
Hemoglobin: 8.6 g/dL — ABNORMAL LOW (ref 12.0–15.0)
Immature Granulocytes: 1 %
Lymphocytes Relative: 10 %
Lymphs Abs: 5.5 10*3/uL — ABNORMAL HIGH (ref 0.7–4.0)
MCH: 30.3 pg (ref 26.0–34.0)
MCHC: 31.4 g/dL (ref 30.0–36.0)
MCV: 96.5 fL (ref 80.0–100.0)
Monocytes Absolute: 9.6 10*3/uL — ABNORMAL HIGH (ref 0.1–1.0)
Monocytes Relative: 17 %
Neutro Abs: 42.1 10*3/uL — ABNORMAL HIGH (ref 1.7–7.7)
Neutrophils Relative %: 72 %
Platelets: 38 10*3/uL — ABNORMAL LOW (ref 150–400)
RBC: 2.84 MIL/uL — ABNORMAL LOW (ref 3.87–5.11)
RDW: 20.8 % — ABNORMAL HIGH (ref 11.5–15.5)
WBC: 58 10*3/uL (ref 4.0–10.5)
nRBC: 0.2 % (ref 0.0–0.2)

## 2023-02-20 LAB — CULTURE, BLOOD (ROUTINE X 2)
Culture: NO GROWTH
Culture: NO GROWTH
Special Requests: ADEQUATE
Special Requests: ADEQUATE

## 2023-02-20 SURGERY — ABDOMINAL AORTOGRAM W/LOWER EXTREMITY
Anesthesia: LOCAL

## 2023-02-20 MED ORDER — HYDROXYUREA 500 MG PO CAPS: 2000.0000 mg | ORAL_CAPSULE | Freq: Every day | ORAL | Status: DC

## 2023-02-20 MED ORDER — TRAMADOL HCL 50 MG PO TABS: 50.0000 mg | ORAL_TABLET | Freq: Four times a day (QID) | ORAL | Status: DC | PRN

## 2023-02-20 MED ORDER — ONDANSETRON HCL 4 MG/2ML IJ SOLN: 4.0000 mg | Freq: Four times a day (QID) | INTRAMUSCULAR | Status: DC | PRN

## 2023-02-20 MED ORDER — IPRATROPIUM-ALBUTEROL 0.5-2.5 (3) MG/3ML IN SOLN: 3.0000 mL | Freq: Two times a day (BID) | RESPIRATORY_TRACT | Status: DC

## 2023-02-20 MED ORDER — POTASSIUM CHLORIDE CRYS ER 20 MEQ PO TBCR: 40.0000 meq | EXTENDED_RELEASE_TABLET | Freq: Two times a day (BID) | ORAL | Status: DC

## 2023-02-20 MED ORDER — CHLORHEXIDINE GLUCONATE CLOTH 2 % EX PADS: 6.0000 | MEDICATED_PAD | Freq: Every day | CUTANEOUS | Status: DC

## 2023-02-20 MED ORDER — SODIUM CHLORIDE 0.9% FLUSH: 3.0000 mL | Freq: Two times a day (BID) | INTRAVENOUS | Status: DC

## 2023-02-20 MED ORDER — ALLOPURINOL 300 MG PO TABS: 300.0000 mg | ORAL_TABLET | Freq: Every day | ORAL | Status: DC

## 2023-02-20 MED ORDER — PROCHLORPERAZINE EDISYLATE 10 MG/2ML IJ SOLN: 5.0000 mg | Freq: Four times a day (QID) | INTRAMUSCULAR | Status: DC | PRN

## 2023-02-20 MED ORDER — IPRATROPIUM-ALBUTEROL 0.5-2.5 (3) MG/3ML IN SOLN: 3.0000 mL | RESPIRATORY_TRACT | Status: DC | PRN

## 2023-02-20 MED ORDER — PANTOPRAZOLE SODIUM 40 MG PO TBEC: 40.0000 mg | DELAYED_RELEASE_TABLET | Freq: Every day | ORAL | Status: DC

## 2023-02-20 MED ORDER — FUROSEMIDE 10 MG/ML IJ SOLN
40.0000 mg | Freq: Once | INTRAMUSCULAR | Status: AC
  Administered 2023-02-20: 40 mg via INTRAVENOUS
  Filled 2023-02-20: qty 4

## 2023-02-20 MED ORDER — METOPROLOL TARTRATE 25 MG PO TABS: 25.0000 mg | ORAL_TABLET | Freq: Two times a day (BID) | ORAL | Status: DC

## 2023-02-20 MED ORDER — HEPARIN (PORCINE) 25000 UT/250ML-% IV SOLN: 1550.0000 [IU]/h | INTRAVENOUS | Status: DC

## 2023-02-20 MED ORDER — SODIUM CHLORIDE 0.9 % IV BOLUS
250.0000 mL | Freq: Once | INTRAVENOUS | Status: AC
  Administered 2023-02-20: 250 mL via INTRAVENOUS

## 2023-02-20 MED ORDER — BISACODYL 10 MG RE SUPP
10.0000 mg | Freq: Every day | RECTAL | Status: DC | PRN
  Administered 2023-02-21: 10 mg via RECTAL
  Filled 2023-02-20: qty 1

## 2023-02-20 MED ORDER — LIDOCAINE 5 % EX PTCH: 1.0000 | MEDICATED_PATCH | CUTANEOUS | Status: DC

## 2023-02-20 MED ORDER — ACETAMINOPHEN 325 MG PO TABS
650.0000 mg | ORAL_TABLET | Freq: Four times a day (QID) | ORAL | Status: DC | PRN
  Administered 2023-02-20 – 2023-02-21 (×3): 650 mg via ORAL
  Filled 2023-02-20 (×3): qty 2

## 2023-02-20 MED ORDER — POLYETHYLENE GLYCOL 3350 17 G PO PACK: 17.0000 g | PACK | Freq: Every day | ORAL | Status: DC | PRN

## 2023-02-20 MED ORDER — LEVOFLOXACIN 750 MG PO TABS: 750.0000 mg | ORAL_TABLET | Freq: Every day | ORAL | Status: DC

## 2023-02-20 MED ORDER — BUDESONIDE 0.25 MG/2ML IN SUSP: 0.2500 mg | Freq: Two times a day (BID) | RESPIRATORY_TRACT | Status: DC

## 2023-02-20 MED ORDER — MORPHINE SULFATE (PF) 2 MG/ML IV SOLN: 2.0000 mg | INTRAVENOUS | Status: DC | PRN

## 2023-02-20 MED ORDER — POTASSIUM CHLORIDE CRYS ER 20 MEQ PO TBCR
40.0000 meq | EXTENDED_RELEASE_TABLET | Freq: Two times a day (BID) | ORAL | Status: DC
  Administered 2023-02-20 – 2023-02-21 (×3): 40 meq via ORAL
  Filled 2023-02-20 (×3): qty 2

## 2023-02-20 MED ORDER — FUROSEMIDE 10 MG/ML IJ SOLN: 60.0000 mg | Freq: Two times a day (BID) | INTRAMUSCULAR | Status: DC

## 2023-02-20 NOTE — Plan of Care (Signed)

## 2023-02-20 NOTE — Progress Notes (Signed)
Called [facility] for transportation.  Lawson Radar, RN

## 2023-02-20 NOTE — Progress Notes (Signed)
Called back to bedside for rest pain, hypotension.   Mentation normal, exam benign except unable to doppler DP on right.    Continue heparin.   Stop Lasix. Cautious fluids. Repeat CXR.

## 2023-02-20 NOTE — Discharge Summary (Addendum)
Physician Discharge Summary   Patient: Lisa [name] MRN: [id number] DOB: 04-23-1945  Admit date:     02/14/2023  Discharge date: 02/20/23  Discharge Physician: Alberteen Sam   PCP: Kirstie Peri, MD     Recommendations at discharge:  Transferred to [facility] for Heme-Onc evaluation for AML Please consult Vascular Surgery for right SFA stenosis and ischemia Please continue diuresis     Discharge Diagnoses: Principal Problem:   Arterial occlusion, lower extremity (HCC) Active Problems:   AML (acute myeloblastic leukemia) (HCC)   Acute on chronic diastolic CHF (congestive heart failure) (HCC)   Paroxysmal atrial fibrillation (HCC)   COPD (chronic obstructive pulmonary disease) (HCC)   Essential hypertension   Coronary artery disease involving native coronary artery of native heart with unstable angina pectoris (HCC)   GERD (gastroesophageal reflux disease)   Hypokalemia   Acute respiratory failure with hypoxia   Pneumonia due to GBS   Hospital Course: 77 y.o. 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  In the ER, noted to have new (since Nov) leukemoid reaction, anemia and thrombocytopenia.    Significant events: 12/17: Admitted to [facility], transferred to [facility] for Vascular surgery evaluation 12/18: Oncology consulted; Flow cytometry of peripheral blood shows blastic/monocytic population; Transfer to tertiary center recommended, patient hesitant; Hydrea started 12/20: Bone marrow biopsy completed; Palliative Care consulted 12/22: More short of breath and hypoxic, BID IV Lasix started 12/23: Patient requesting transfer to tertiary center   Significant studies: 12/17 CTA chest: No PE, bilateral patchy ground glass in lung bases, lymphadenopathy 12/17 CTA aorto-bifem: Occlusion of the distal right  superficial femoral artery with reconstitution of the popliteal artery 12/20 CXR: emphysema, no infiltrates 12/23 CXR: new infiltrates, c/w edema   Significant microbiology data: 12/17 blood culture x2: NG 12/18 respiratory virus panel: pan-negative 12/21 respiratory culture: Group B strep 12/21 blood cx x2: NG    Procedures: 12/20 Bone marrow biopsy    Consults: Oncology Vascular surgery Palliative Care Interventional radiology         * AML (acute myeloid leukemia) (HCC) Peripheral flow cytometry consistent with new AML.    Bone marrow biopsy pending on 12/20.  S/p right internal jugular vein port catheter placement.  - Continue hydroxyurea 2000 mg daily.   - Transfer to [facility]   Arterial occlusion, lower extremity (HCC) Acute limb ischemia Admitted and heparin gtt started.  Statin intolerant.  Aspirin on hold due to thrombocytopenia  Vascular surgery consulted.  CT angiography showed high grade stenosis vs occluded SFA.  Possibly thromboembolic.  Has had waxing and waning pain, mostly controlled with tramadol.  Plan was for angiography with possible embolectomy/stent today, but given her symptoms seemed somewhat better on heparin, and concern regarding her ability to lie flat for extended procedures given her hypoxia, decision was made to continue heparin.  Angiography/embolectomy deferred for now.    - Continue heparin drip - Consult Vascular surgery after transfer    Acute on chronic diastolic CHF (congestive heart failure) (HCC) Echo last Oct showed EF normal, grade II DD.    Here, she has had worsening dyspnea, last 2 days, started IV Lasix yesterday when CXR showed edema.   - Continue IV Lasix    Paroxysmal atrial fibrillation (HCC) Long-term on Apixaban, currently on hold while on heparin drip.  Rate controlled.  COPD (chronic  obstructive pulmonary disease) (HCC) Community acquired pneumonia due to group B strep Possible bilateral pneumonia at  the lower lobes. Patient on antibiotic therapy with levofloxacin for community acquired pneumonia. (Sepsis ruled out)   Essential hypertension On metoprolol  Coronary artery disease involving native coronary artery of native heart with unstable angina pectoris (HCC) No acute coronary syndrome.   GERD (gastroesophageal reflux disease) Continue with pantoprazole.   Hypokalemia Supplement K           [facility] Controlled Substances Registry was reviewed for this patient prior to discharge.    Disposition: Transfer to [facility]   DISCHARGE MEDICATION: Allergies as of 02/20/2023       Reactions   Brilinta [ticagrelor] Shortness Of Breath   Penicillins Anaphylaxis   immediate rash, facial/tongue/throat swelling, SOB or lightheadedness with hypotension severe rash involving mucus membranes or skin necrosis. PharmD confirmed allergy 02/18/23 - no cephalosporins tried in the past.   Ranexa [ranolazine] Shortness Of Breath   Sulfa Antibiotics Anaphylaxis, Rash   Blistering rash on hands and feet   Crestor [rosuvastatin] Other (See Comments)   Myalgias        Medication List     STOP taking these medications    apixaban 5 MG Tabs tablet Commonly known as: ELIQUIS   esomeprazole 20 MG capsule Commonly known as: NEXIUM Replaced by: pantoprazole 40 MG tablet   furosemide 20 MG tablet Commonly known as: LASIX Replaced by: furosemide 10 MG/ML injection   lisinopril 20 MG tablet Commonly known as: ZESTRIL   metoprolol succinate 25 MG 24 hr tablet Commonly known as: Toprol XL       TAKE these medications    acetaminophen 500 MG tablet Commonly known as: TYLENOL Take 1,000 mg by mouth every 6 (six) hours as needed for moderate pain.   allopurinol 300 MG tablet Commonly known as: ZYLOPRIM Take 1 tablet (300 mg total) by mouth daily. Start taking on: February 21, 2023   budesonide 0.25 MG/2ML nebulizer solution Commonly known as: PULMICORT Take  2 mLs (0.25 mg total) by nebulization 2 (two) times daily.   Chlorhexidine Gluconate Cloth 2 % Pads Apply 6 each topically daily. Start taking on: February 21, 2023   CoQ10 100 MG Caps Take 100 mg by mouth daily.   diazepam 2 MG tablet Commonly known as: VALIUM Take 2 mg by mouth daily as needed for anxiety.   docusate sodium 100 MG capsule Commonly known as: COLACE Take 100 mg by mouth daily.   EPINEPHrine 0.3 mg/0.3 mL Soaj injection Commonly known as: EPI-PEN Inject 0.3 mg into the muscle as needed for anaphylaxis.   fluticasone-salmeterol 115-21 MCG/ACT inhaler Commonly known as: ADVAIR HFA Inhale 2 puffs into the lungs at bedtime.   furosemide 10 MG/ML injection Commonly known as: LASIX Inject 6 mLs (60 mg total) into the vein every 12 (twelve) hours. Replaces: furosemide 20 MG tablet   heparin 47829 UT/250ML infusion Inject 1,550 Units/hr into the vein continuous.   hydroxyurea 500 MG capsule Commonly known as: HYDREA Take 4 capsules (2,000 mg total) by mouth daily. May take with food to minimize GI side effects.   ipratropium-albuterol 0.5-2.5 (3) MG/3ML Soln Commonly known as: DUONEB Take 3 mLs by nebulization every 2 (two) hours as needed.   ipratropium-albuterol 0.5-2.5 (3) MG/3ML Soln Commonly known as: DUONEB Take 3 mLs by nebulization 2 (two) times daily.   levofloxacin 750 MG tablet Commonly known as: LEVAQUIN Take 1 tablet (750 mg total) by mouth daily. Start  taking on: February 21, 2023   lidocaine 5 % Commonly known as: LIDODERM Place 1 patch onto the skin daily. Remove & Discard patch within 12 hours or as directed by MD   metoprolol tartrate 25 MG tablet Commonly known as: LOPRESSOR Take 1 tablet (25 mg total) by mouth 2 (two) times daily.   morphine (PF) 2 MG/ML injection Inject 1 mL (2 mg total) into the vein every 3 (three) hours as needed (pain not improved by PO pain meds).   MOVE FREE PO Take 1 tablet by mouth daily.    nitroGLYCERIN 0.4 MG SL tablet Commonly known as: NITROSTAT Place 1 tablet (0.4 mg total) under the tongue every 5 (five) minutes as needed for chest pain.   ondansetron 4 MG/2ML Soln injection Commonly known as: ZOFRAN Inject 2 mLs (4 mg total) into the vein every 6 (six) hours as needed for refractory nausea / vomiting.   pantoprazole 40 MG tablet Commonly known as: PROTONIX Take 1 tablet (40 mg total) by mouth daily. Start taking on: February 21, 2023 Replaces: esomeprazole 20 MG capsule   polyethylene glycol 17 g packet Commonly known as: MIRALAX / GLYCOLAX Take 17 g by mouth daily as needed for mild constipation.   potassium chloride SA 20 MEQ tablet Commonly known as: KLOR-CON M Take 2 tablets (40 mEq total) by mouth 2 (two) times daily. What changed:  medication strength how much to take when to take this reasons to take this   PROBIOTIC PO Take 1 capsule by mouth daily.   prochlorperazine 10 MG/2ML injection Commonly known as: COMPAZINE Inject 1 mL (5 mg total) into the vein every 6 (six) hours as needed.   sodium chloride flush 0.9 % Soln Commonly known as: NS Inject 3 mLs into the vein every 12 (twelve) hours.   traMADol 50 MG tablet Commonly known as: ULTRAM Take 1 tablet (50 mg total) by mouth every 6 (six) hours as needed for moderate pain (pain score 4-6).   VITAMIN B-12 PO Take 1 tablet by mouth daily.   Vitamin D3 125 MCG (5000 UT) Caps Take 5,000 Units by mouth daily.           Discharge Exam: Filed Weights   02/18/23 0258 02/19/23 0355 02/20/23 0400  Weight: 89 kg 95 kg 95.3 kg    General: Pt is alert, awake, not in acute distress, lying flat, on O2 but in no respiratory distress Cardiovascular: RRR, nl S1-S2, no murmurs appreciated.   No LE edema.   Respiratory: Normal respiratory rate and rhythm.  CTAB without rales or wheezes. Abdominal: Abdomen soft and non-tender.  No distension or HSM.   Neuro/Psych: Strength symmetric in  upper and lower extremities.  Judgment and insight appear slightly impaired.   Condition at discharge: poor  The results of significant diagnostics from this hospitalization (including imaging, microbiology, ancillary and laboratory) are listed below for reference.   Imaging Studies: DG Chest 1 View Result Date: 02/19/2023 CLINICAL DATA:  [id number] Wheezing [id number] EXAM: CHEST  1 VIEW COMPARISON:  02/17/2023 chest radiograph. FINDINGS: Left rotated chest radiograph. Right internal jugular Port-A-Cath terminates at the cavoatrial junction. Stable cardiomediastinal silhouette with mild cardiomegaly. No pneumothorax. Small bilateral pleural effusions appear new, right greater than left. Diffuse prominence of the parahilar interstitial markings, worsened. Increased patchy bibasilar opacities. IMPRESSION: 1. Stable mild cardiomegaly. Worsened diffuse prominence of the parahilar interstitial markings, favor moderate cardiogenic pulmonary edema. 2. New small bilateral pleural effusions, right greater than left. 3. Increased patchy bibasilar  opacities, favor atelectasis. Electronically Signed   By: Delbert Phenix M.D.   On: 02/19/2023 14:15   DG CHEST PORT 1 VIEW Result Date: 02/17/2023 CLINICAL DATA:  Shortness of breath EXAM: PORTABLE CHEST 1 VIEW COMPARISON:  02/14/2023 FINDINGS: Right-sided central venous port tip over the SVC. Stable cardiomediastinal silhouette. Emphysema. No acute airspace disease, pleural effusion or pneumothorax. IMPRESSION: No active disease. Emphysema. Electronically Signed   By: Jasmine Pang M.D.   On: 02/17/2023 15:29   IR IMAGING GUIDED PORT INSERTION Result Date: 02/17/2023 INDICATION: 77 year old female for a new diagnosis of leukocytosis concerning for leukemia. She presents for combined bone marrow biopsy and port catheter placement. EXAM: CT GUIDED BONE MARROW ASPIRATION AND CORE BIOPSY IR PORT PLACEMENT Interventional Radiologist:  Sterling Big, MD MEDICATIONS: None.  ANESTHESIA/SEDATION: Moderate (conscious) sedation was employed during this procedure. A total of 3.5 milligrams versed and 150 micrograms fentanyl were administered intravenously by the Radiology nurse. The patient's level of consciousness and vital signs were monitored continuously by radiology nursing throughout the procedure under my direct supervision. Total monitored sedation time: 60 minutes FLUOROSCOPY: Radiation exposure index: 11.8 mGy reference air kerma COMPLICATIONS: None immediate. Estimated blood loss: <25 mL PROCEDURE: Informed written consent was obtained from the patient after a thorough discussion of the procedural risks, benefits and alternatives. All questions were addressed. Maximal Sterile Barrier Technique was utilized including caps, mask, sterile gowns, sterile gloves, sterile drape, hand hygiene and skin antiseptic. A timeout was performed prior to the initiation of the procedure. BONE MARROW BIOPSY The patient was positioned prone and fluoroscopy was performed of the pelvis to demonstrate the iliac marrow spaces. Maximal barrier sterile technique utilized including caps, mask, sterile gowns, sterile gloves, large sterile drape, hand hygiene, and betadine prep. Under sterile conditions and local anesthesia, an 11 gauge coaxial bone biopsy needle was advanced into the left iliac marrow space. Needle position was confirmed with CT imaging. Initially, bone marrow aspiration was performed. Next, the 11 gauge outer cannula was utilized to obtain a left iliac bone marrow core biopsy. Needle was removed. Hemostasis was obtained with compression. The patient tolerated the procedure well. Samples were prepared with the cytotechnologist. PORT PLACEMENT The patient was repositioned into the supine position. The right neck and chest were sterilely prepped in the standard fashion using chlorhexidine skin prep. Ultrasound demonstrated patency of the right internal jugular vein, and this was documented  with an image. Under real-time ultrasound guidance, this vein was accessed with a 21 gauge micropuncture needle and image documentation was performed. A small dermatotomy was made at the access site with an 11 scalpel. A 0.018" wire was advanced into the SVC and the access needle exchanged for a 23F micropuncture vascular sheath. The 0.018" wire was then removed and a 0.035" wire advanced into the IVC. An appropriate location for the subcutaneous reservoir was selected below the clavicle and an incision was made through the skin and underlying soft tissues. The subcutaneous tissues were then dissected using a combination of blunt and sharp surgical technique and a pocket was formed. A single lumen power injectable portacatheter (Bard Clear Vue) was then tunneled through the subcutaneous tissues from the pocket to the dermatotomy and the port reservoir placed within the subcutaneous pocket. The venous access site was then serially dilated and a peel away vascular sheath placed over the wire. The wire was removed and the port catheter advanced into position under fluoroscopic guidance. The catheter tip is positioned in the superior cavoatrial junction. This was  documented with a spot image. The portacatheter was then tested and found to flush and aspirate well. The port was flushed with saline followed by 100 units/mL heparinized saline. The pocket was then closed in two layers using first subdermal inverted interrupted absorbable sutures followed by a running subcuticular suture. The epidermis was then sealed with Dermabond. The dermatotomy at the venous access site was also closed with Dermabond. IMPRESSION: 1. Successful left iliac bone marrow aspirate and core biopsy 2. Successful right IJ approach port catheter placement (Bard Clear Vue). The catheter tubing is at the superior cavoatrial junction and the catheter is ready for immediate use. Electronically Signed   By: Malachy Moan M.D.   On: 02/17/2023 13:13    IR BONE MARROW BIOPSY & ASPIRATION Result Date: 02/17/2023 INDICATION: 77 year old female for a new diagnosis of leukocytosis concerning for leukemia. She presents for combined bone marrow biopsy and port catheter placement. EXAM: CT GUIDED BONE MARROW ASPIRATION AND CORE BIOPSY IR PORT PLACEMENT Interventional Radiologist:  Sterling Big, MD MEDICATIONS: None. ANESTHESIA/SEDATION: Moderate (conscious) sedation was employed during this procedure. A total of 3.5 milligrams versed and 150 micrograms fentanyl were administered intravenously by the Radiology nurse. The patient's level of consciousness and vital signs were monitored continuously by radiology nursing throughout the procedure under my direct supervision. Total monitored sedation time: 60 minutes FLUOROSCOPY: Radiation exposure index: 11.8 mGy reference air kerma COMPLICATIONS: None immediate. Estimated blood loss: <25 mL PROCEDURE: Informed written consent was obtained from the patient after a thorough discussion of the procedural risks, benefits and alternatives. All questions were addressed. Maximal Sterile Barrier Technique was utilized including caps, mask, sterile gowns, sterile gloves, sterile drape, hand hygiene and skin antiseptic. A timeout was performed prior to the initiation of the procedure. BONE MARROW BIOPSY The patient was positioned prone and fluoroscopy was performed of the pelvis to demonstrate the iliac marrow spaces. Maximal barrier sterile technique utilized including caps, mask, sterile gowns, sterile gloves, large sterile drape, hand hygiene, and betadine prep. Under sterile conditions and local anesthesia, an 11 gauge coaxial bone biopsy needle was advanced into the left iliac marrow space. Needle position was confirmed with CT imaging. Initially, bone marrow aspiration was performed. Next, the 11 gauge outer cannula was utilized to obtain a left iliac bone marrow core biopsy. Needle was removed. Hemostasis was obtained  with compression. The patient tolerated the procedure well. Samples were prepared with the cytotechnologist. PORT PLACEMENT The patient was repositioned into the supine position. The right neck and chest were sterilely prepped in the standard fashion using chlorhexidine skin prep. Ultrasound demonstrated patency of the right internal jugular vein, and this was documented with an image. Under real-time ultrasound guidance, this vein was accessed with a 21 gauge micropuncture needle and image documentation was performed. A small dermatotomy was made at the access site with an 11 scalpel. A 0.018" wire was advanced into the SVC and the access needle exchanged for a 41F micropuncture vascular sheath. The 0.018" wire was then removed and a 0.035" wire advanced into the IVC. An appropriate location for the subcutaneous reservoir was selected below the clavicle and an incision was made through the skin and underlying soft tissues. The subcutaneous tissues were then dissected using a combination of blunt and sharp surgical technique and a pocket was formed. A single lumen power injectable portacatheter (Bard Clear Vue) was then tunneled through the subcutaneous tissues from the pocket to the dermatotomy and the port reservoir placed within the subcutaneous pocket. The venous  access site was then serially dilated and a peel away vascular sheath placed over the wire. The wire was removed and the port catheter advanced into position under fluoroscopic guidance. The catheter tip is positioned in the superior cavoatrial junction. This was documented with a spot image. The portacatheter was then tested and found to flush and aspirate well. The port was flushed with saline followed by 100 units/mL heparinized saline. The pocket was then closed in two layers using first subdermal inverted interrupted absorbable sutures followed by a running subcuticular suture. The epidermis was then sealed with Dermabond. The dermatotomy at the  venous access site was also closed with Dermabond. IMPRESSION: 1. Successful left iliac bone marrow aspirate and core biopsy 2. Successful right IJ approach port catheter placement (Bard Clear Vue). The catheter tubing is at the superior cavoatrial junction and the catheter is ready for immediate use. Electronically Signed   By: Malachy Moan M.D.   On: 02/17/2023 13:13   CT Angio Chest PE W and/or Wo Contrast Result Date: 02/14/2023 CLINICAL DATA:  Right leg pain for 1 week that has gotten progressively worse. Lasix not decreasing swelling. PE suspected. EXAM: CT ANGIOGRAPHY CHEST WITH CONTRAST CT ANGIOGRAPHY OF ABDOMINAL AORTA WITH ILIOFEMORAL RUNOFF TECHNIQUE: Multidetector CT imaging of the abdomen, pelvis and lower extremities was performed using the standard protocol during bolus administration of intravenous contrast. Multiplanar CT image reconstructions and MIPs were obtained to evaluate the vascular anatomy. Multidetector CT imaging of the chest was performed using the standard protocol during bolus administration of intravenous contrast. Multiplanar CT image reconstructions and MIPs were obtained to evaluate the vascular anatomy. RADIATION DOSE REDUCTION: This exam was performed according to the departmental dose-optimization program which includes automated exposure control, adjustment of the mA and/or kV according to patient size and/or use of iterative reconstruction technique. CONTRAST:  OMNIPAQUE IOHEXOL 350 MG/ML SOLN COMPARISON:  Same day chest radiograph; CT abdomen and pelvis 01/13/2023 and CT chest 12/30/2022 FINDINGS: Cardiovascular: No pericardial effusion. No pulmonary embolism. Normal caliber aorta. Aortic atherosclerosis. Mediastinum/Nodes: Trachea and esophagus are unremarkable. New mediastinal and right hilar lymphadenopathy. For example 1.0 cm pretracheal node on series 1/image 33 and 1.0 cm right hilar node on series 1/image 41. Lungs/Pleura: Emphysema. Diffuse interlobular  septal thickening. There are new bilateral patchy ground-glass opacities in the dependent lungs bilaterally. No pleural effusion or pneumothorax. Musculoskeletal: No acute fracture. Review of the MIP images confirms the above findings. VASCULAR Aorta: No aneurysm or dissection. Scattered mixed density atherosclerotic plaque without hemodynamically significant stenosis. Celiac: Severe narrowing at the origin of the celiac axis. SMA: Patent. Renals: Patent left renal artery. Moderate narrowing at the origin of the right renal artery. IMA: Patent. RIGHT Lower Extremity Inflow: Scattered atherosclerotic plaque without hemodynamically significant narrowing. No aneurysm or dissection. Outflow: Occlusion of the distal superficial femoral artery with reconstitution of the popliteal artery. Runoff: Three-vessel runoff of the right lower extremity. The distal anterior tibial and peroneal arteries are atretic. Patent dorsalis pedis. LEFT Lower Extremity Inflow: Scattered atherosclerotic plaque without hemodynamically significant narrowing. No aneurysm or dissection. Outflow: Patent.  No aneurysm or dissection. Runoff: Patent three vessel runoff to the ankle. Patent dorsalis pedis. Veins: No obvious venous abnormality within the limitations of this arterial phase study. Review of the MIP images confirms the above findings. NON-VASCULAR Hepatobiliary: No acute abnormality. Pancreas: Unremarkable. Spleen: Unremarkable. Adrenals/Urinary Tract: Normal adrenal glands. Geographic hypoattenuation in the anterior left kidney is unchanged from 01/13/2023 and compatible with infarct. No urinary calculi or hydronephrosis.  Unremarkable bladder. Stomach/Bowel: Normal caliber large and small bowel. Colonic diverticulosis without diverticulitis. Appendix and stomach are within normal limits. Lymphatic: No lymphadenopathy. Reproductive: Hysterectomy. Other: No free intraperitoneal fluid or air. Musculoskeletal: No acute fracture. IMPRESSION:  1. Occlusion of the distal right superficial femoral artery with reconstitution of the popliteal artery. 2. No evidence of pulmonary embolism. 3. New bilateral patchy ground-glass opacities in the dependent lungs bilaterally, likely infectious/inflammatory. 4. New mediastinal and right hilar lymphadenopathy, favored reactive. 5. Unchanged left renal infarct. Aortic Atherosclerosis (ICD10-I70.0) and Emphysema (ICD10-J43.9). Critical Value/emergent results were called by telephone at the time of interpretation on 02/14/2023 at 6:50 pm to provider Dr. Wyn Forster, who verbally acknowledged these results. Electronically Signed   By: Minerva Fester M.D.   On: 02/14/2023 18:51   CT Angio Aortobifemoral W and/or Wo Contrast Result Date: 02/14/2023 CLINICAL DATA:  Right leg pain for 1 week that has gotten progressively worse. Lasix not decreasing swelling. PE suspected. EXAM: CT ANGIOGRAPHY CHEST WITH CONTRAST CT ANGIOGRAPHY OF ABDOMINAL AORTA WITH ILIOFEMORAL RUNOFF TECHNIQUE: Multidetector CT imaging of the abdomen, pelvis and lower extremities was performed using the standard protocol during bolus administration of intravenous contrast. Multiplanar CT image reconstructions and MIPs were obtained to evaluate the vascular anatomy. Multidetector CT imaging of the chest was performed using the standard protocol during bolus administration of intravenous contrast. Multiplanar CT image reconstructions and MIPs were obtained to evaluate the vascular anatomy. RADIATION DOSE REDUCTION: This exam was performed according to the departmental dose-optimization program which includes automated exposure control, adjustment of the mA and/or kV according to patient size and/or use of iterative reconstruction technique. CONTRAST:  OMNIPAQUE IOHEXOL 350 MG/ML SOLN COMPARISON:  Same day chest radiograph; CT abdomen and pelvis 01/13/2023 and CT chest 12/30/2022 FINDINGS: Cardiovascular: No pericardial effusion. No pulmonary embolism.  Normal caliber aorta. Aortic atherosclerosis. Mediastinum/Nodes: Trachea and esophagus are unremarkable. New mediastinal and right hilar lymphadenopathy. For example 1.0 cm pretracheal node on series 1/image 33 and 1.0 cm right hilar node on series 1/image 41. Lungs/Pleura: Emphysema. Diffuse interlobular septal thickening. There are new bilateral patchy ground-glass opacities in the dependent lungs bilaterally. No pleural effusion or pneumothorax. Musculoskeletal: No acute fracture. Review of the MIP images confirms the above findings. VASCULAR Aorta: No aneurysm or dissection. Scattered mixed density atherosclerotic plaque without hemodynamically significant stenosis. Celiac: Severe narrowing at the origin of the celiac axis. SMA: Patent. Renals: Patent left renal artery. Moderate narrowing at the origin of the right renal artery. IMA: Patent. RIGHT Lower Extremity Inflow: Scattered atherosclerotic plaque without hemodynamically significant narrowing. No aneurysm or dissection. Outflow: Occlusion of the distal superficial femoral artery with reconstitution of the popliteal artery. Runoff: Three-vessel runoff of the right lower extremity. The distal anterior tibial and peroneal arteries are atretic. Patent dorsalis pedis. LEFT Lower Extremity Inflow: Scattered atherosclerotic plaque without hemodynamically significant narrowing. No aneurysm or dissection. Outflow: Patent.  No aneurysm or dissection. Runoff: Patent three vessel runoff to the ankle. Patent dorsalis pedis. Veins: No obvious venous abnormality within the limitations of this arterial phase study. Review of the MIP images confirms the above findings. NON-VASCULAR Hepatobiliary: No acute abnormality. Pancreas: Unremarkable. Spleen: Unremarkable. Adrenals/Urinary Tract: Normal adrenal glands. Geographic hypoattenuation in the anterior left kidney is unchanged from 01/13/2023 and compatible with infarct. No urinary calculi or hydronephrosis. Unremarkable  bladder. Stomach/Bowel: Normal caliber large and small bowel. Colonic diverticulosis without diverticulitis. Appendix and stomach are within normal limits. Lymphatic: No lymphadenopathy. Reproductive: Hysterectomy. Other: No free intraperitoneal fluid or air. Musculoskeletal:  No acute fracture. IMPRESSION: 1. Occlusion of the distal right superficial femoral artery with reconstitution of the popliteal artery. 2. No evidence of pulmonary embolism. 3. New bilateral patchy ground-glass opacities in the dependent lungs bilaterally, likely infectious/inflammatory. 4. New mediastinal and right hilar lymphadenopathy, favored reactive. 5. Unchanged left renal infarct. Aortic Atherosclerosis (ICD10-I70.0) and Emphysema (ICD10-J43.9). Critical Value/emergent results were called by telephone at the time of interpretation on 02/14/2023 at 6:50 pm to provider Dr. Wyn Forster, who verbally acknowledged these results. Electronically Signed   By: Minerva Fester M.D.   On: 02/14/2023 18:51   DG Chest Portable 1 View Result Date: 02/14/2023 CLINICAL DATA:  Right leg pain and swelling. EXAM: PORTABLE CHEST 1 VIEW COMPARISON:  12/30/2022. FINDINGS: Bilateral lungs appear hyperlucent with coarse bronchovascular markings, in keeping with COPD. There are atelectatic changes at the left lung base. Bilateral lungs otherwise appear clear. No overt pulmonary edema. No dense consolidation or lung collapse. Bilateral costophrenic angles are clear. Stable cardio-mediastinal silhouette. No acute osseous abnormalities. The soft tissues are within normal limits. IMPRESSION: *No active disease.  COPD. Electronically Signed   By: Jules Schick M.D.   On: 02/14/2023 15:53   US Venous Img Lower Unilateral Right Result Date: 02/14/2023 CLINICAL DATA:  Right lower extremity pain and edema. EXAM: RIGHT LOWER EXTREMITY VENOUS DOPPLER ULTRASOUND TECHNIQUE: Gray-scale sonography with graded compression, as well as color Doppler and duplex ultrasound  were performed to evaluate the lower extremity deep venous systems from the level of the common femoral vein and including the common femoral, femoral, profunda femoral, popliteal and calf veins including the posterior tibial, peroneal and gastrocnemius veins when visible. The superficial great saphenous vein was also interrogated. Spectral Doppler was utilized to evaluate flow at rest and with distal augmentation maneuvers in the common femoral, femoral and popliteal veins. COMPARISON:  None Available. FINDINGS: Contralateral Common Femoral Vein: Respiratory phasicity is normal and symmetric with the symptomatic side. No evidence of thrombus. Normal compressibility. Common Femoral Vein: No evidence of thrombus. Normal compressibility, respiratory phasicity and response to augmentation. Saphenofemoral Junction: No evidence of thrombus. Normal compressibility and flow on color Doppler imaging. Profunda Femoral Vein: No evidence of thrombus. Normal compressibility and flow on color Doppler imaging. Femoral Vein: No evidence of thrombus. Normal compressibility, respiratory phasicity and response to augmentation. Popliteal Vein: No evidence of thrombus. Normal compressibility, respiratory phasicity and response to augmentation. Calf Veins: No evidence of thrombus. Normal compressibility and flow on color Doppler imaging. Superficial Great Saphenous Vein: No evidence of thrombus. Normal compressibility. Venous Reflux:  None. Other Findings: No evidence of superficial thrombophlebitis or abnormal fluid collection. IMPRESSION: No evidence of right lower extremity deep venous thrombosis. Electronically Signed   By: Irish Lack M.D.   On: 02/14/2023 15:40    Microbiology: Results for orders placed or performed during the hospital encounter of 02/14/23  Culture, blood (Routine X 2) w Reflex to ID Panel     Status: None   Collection Time: 02/14/23 11:54 PM   Specimen: BLOOD  Result Value Ref Range Status    Specimen Description BLOOD RIGHT ANTECUBITAL  Final   Special Requests   Final    BOTTLES DRAWN AEROBIC AND ANAEROBIC Blood Culture adequate volume   Culture   Final    NO GROWTH 5 DAYS Performed at [facility], [address] [place], [place] [address]    Report Status 02/20/2023 FINAL  Final  Culture, blood (Routine X 2) w Reflex to ID Panel     Status:  None   Collection Time: 02/14/23 11:54 PM   Specimen: BLOOD RIGHT HAND  Result Value Ref Range Status   Specimen Description   Final    BLOOD RIGHT HAND Performed at [facility], [address] [place], [place] [address]    Special Requests   Final    BOTTLES DRAWN AEROBIC AND ANAEROBIC Blood Culture adequate volume Performed at [facility], [address] [place], [place] [address]    Culture   Final    NO GROWTH 5 DAYS Performed at [facility], [address], [place], [place] [address]    Report Status 02/20/2023 FINAL  Final  Respiratory (~20 pathogens) panel by PCR     Status: None   Collection Time: 02/15/23 12:10 PM   Specimen: Nasopharyngeal Swab; Respiratory  Result Value Ref Range Status   Adenovirus NOT DETECTED NOT DETECTED Final   Coronavirus 229E NOT DETECTED NOT DETECTED Final    Comment: (NOTE) The Coronavirus on the Respiratory Panel, DOES NOT test for the novel  Coronavirus (2019 nCoV)    Coronavirus HKU1 NOT DETECTED NOT DETECTED Final   Coronavirus NL63 NOT DETECTED NOT DETECTED Final   Coronavirus OC43 NOT DETECTED NOT DETECTED Final   Metapneumovirus NOT DETECTED NOT DETECTED Final   Rhinovirus / Enterovirus NOT DETECTED NOT DETECTED Final   Influenza A NOT DETECTED NOT DETECTED Final   Influenza B NOT DETECTED NOT DETECTED Final   Parainfluenza Virus 1 NOT DETECTED NOT DETECTED Final   Parainfluenza Virus 2 NOT DETECTED NOT DETECTED Final   Parainfluenza Virus 3 NOT DETECTED NOT DETECTED Final   Parainfluenza Virus 4 NOT DETECTED NOT DETECTED Final   Respiratory Syncytial Virus  NOT DETECTED NOT DETECTED Final   Bordetella pertussis NOT DETECTED NOT DETECTED Final   Bordetella Parapertussis NOT DETECTED NOT DETECTED Final   Chlamydophila pneumoniae NOT DETECTED NOT DETECTED Final   Mycoplasma pneumoniae NOT DETECTED NOT DETECTED Final    Comment: Performed at [facility], [address], [place], [place] [address]  Expectorated Sputum Assessment w Gram Stain, Rflx to Resp Cult     Status: None   Collection Time: 02/18/23 10:45 AM   Specimen: Sputum  Result Value Ref Range Status   Specimen Description SPUTUM  Final   Special Requests NONE  Final   Sputum evaluation   Final    THIS SPECIMEN IS ACCEPTABLE FOR SPUTUM CULTURE Performed at [facility], [address] [place], [place] [address]    Report Status 02/18/2023 FINAL  Final  Culture, Respiratory w Gram Stain     Status: None (Preliminary result)   Collection Time: 02/18/23 10:45 AM   Specimen: SPU  Result Value Ref Range Status   Specimen Description SPUTUM  Final   Special Requests NONE Reflexed from [id number]  Final   Gram Stain   Final    FEW GRAM POSITIVE COCCI FEW YEAST FEW GRAM POSITIVE RODS RARE GRAM NEGATIVE RODS    Culture   Final    RARE GROUP B STREP(S.AGALACTIAE)ISOLATED TESTING AGAINST S. AGALACTIAE NOT ROUTINELY PERFORMED DUE TO PREDICTABILITY OF AMP/PEN/VAN SUSCEPTIBILITY. Performed at [facility], [address] [place], [place] [address]    Report Status PENDING  Incomplete  Culture, blood (Routine X 2) w Reflex to ID Panel     Status: None (Preliminary result)   Collection Time: 02/18/23 11:12 AM   Specimen: BLOOD LEFT ARM  Result Value Ref Range Status   Specimen Description  BLOOD LEFT ARM  Final   Special Requests   Final    BOTTLES DRAWN AEROBIC AND ANAEROBIC Blood Culture results may not be optimal due to an inadequate volume of blood received in culture bottles   Culture   Final    NO GROWTH 2 DAYS Performed at [facility], [address] [place], [place] [address]    Report Status PENDING  Incomplete  Culture, blood (Routine X 2) w Reflex to ID Panel     Status: None (Preliminary result)   Collection Time: 02/18/23 11:18 AM   Specimen: BLOOD RIGHT HAND  Result Value Ref Range Status   Specimen Description BLOOD RIGHT HAND  Final   Special Requests   Final    BOTTLES DRAWN AEROBIC AND ANAEROBIC Blood Culture results may not be optimal due to an inadequate volume of blood received in culture bottles   Culture   Final    NO GROWTH 2 DAYS Performed at [facility], [address], [place], [place] [address]    Report Status PENDING  Incomplete  MRSA Next Gen by PCR, Nasal     Status: None   Collection Time: 02/19/23  5:15 AM   Specimen: Nasal Mucosa; Nasal Swab  Result Value Ref Range Status   MRSA by PCR Next Gen NOT DETECTED NOT DETECTED Final    Comment: (NOTE) The GeneXpert MRSA Assay (FDA approved for NASAL specimens only), is one component of a comprehensive MRSA colonization surveillance program. It is not intended to diagnose MRSA infection nor to guide or monitor treatment for MRSA infections. Test performance is not FDA approved in patients less than 37 years old. Performed at [facility], [address], [place], [place] [address]     Labs: CBC: Recent Labs  Lab 02/16/23 0324 02/17/23 0350 02/18/23 0515 02/19/23 0500 02/20/23 0500  WBC 56.9* 84.1* 70.0* 62.8* 58.0*  NEUTROABS 29.8* 51.1* 39.0* 45.0* 42.1*  HGB 9.5* 10.1* 9.1* 8.8* 8.6*  HCT 30.7* 32.3* 29.7* 28.6* 27.4*  MCV 96.8 97.3 99.7 99.3 96.5  PLT 53* 54* 42* 38* 38*   Basic Metabolic Panel: Recent Labs  Lab 02/14/23 2041 02/15/23 0605 02/16/23 0324 02/17/23 0350 02/18/23 0515 02/19/23 0500 02/20/23 0500  NA  --  138 137 140 140 140 139  K  --  3.9 3.0* 4.3 3.7 4.2 3.1*  CL  --  105 106 110 110 110 105  CO2  --  24 25 23 23  21* 23  GLUCOSE  --  116* 127* 119* 109* 133* 137*  BUN  --  6* 5* 9 8 9 13   CREATININE   --  0.95 1.07* 1.25* 1.20* 0.99 1.27*  CALCIUM  --  7.9* 7.7* 8.1* 7.6* 8.0* 8.0*  MG 1.7 1.6* 2.2  --   --   --   --   PHOS 2.0* 2.7  --   --   --   --   --    Liver Function Tests: Recent Labs  Lab 02/16/23 0324 02/17/23 0350 02/18/23 0515 02/19/23 0500 02/20/23 0500  AST 19 19 21 28 23   ALT 12 15 16 20 20   ALKPHOS 101 114 106 116 125  BILITOT 0.7 0.8 0.7 0.9 1.0  PROT 5.7* 6.3* 5.5* 5.5* 5.5*  ALBUMIN 2.6* 2.6* 2.4* 2.3* 2.2*   CBG: No results for input(s): "GLUCAP" in the last 168 hours.  Discharge time spent: approximately 35 minutes spent on discharge counseling, evaluation of patient on  day of discharge, and coordination of discharge planning with nursing, social work, pharmacy and case management  Signed: Alberteen Sam, MD [facility] 02/20/2023

## 2023-02-20 NOTE — Progress Notes (Signed)
This chaplain responded to PMT NP-Dawn consult for creating/updating the Pt. Advance Directive: HCPOA and Living Will. The Pt. is awake beginning to eat lunch at the time of the chaplain's visit. The Pt. daughters [name] and [name] are visiting.  The chaplain reviewed AD education with the Pt. The Pt. answered clarifying questions before calling the notary.  The chaplain is present with the Pt., notary, witnesses, [name] and [name] for notarizing of the Pt. Advance Directive: HCPOA and Living Will. The Pt. chose Lisa [name] as her healthcare agent. If this person is unable or unwilling to serve in this role the Pt. next choice is Lisa [name].  The chaplain gave the Pt. the original AD along with two copies. The chaplain scanned the Pt. AD into the Pt. EMR.    This chaplain is available for F/U spiritual care as needed.   Chaplain Stephanie Acre [phone]

## 2023-02-20 NOTE — Progress Notes (Signed)
Called report to Byrd Hesselbach, RN at [facility]. Pt going to room C 613.  Lawson Radar, RN

## 2023-02-20 NOTE — Progress Notes (Signed)
Patient suddenly started complaining of extreme shortness of breath.  Could hear loud wheezing on both sides of her chest. Also started congested coughing. Nurse gave a Duoneb treatment but patient said it didn't help. O2 at 93 % on 4 L nasal cannula. HR 82. BP is 127/62. Paged the on call hospitalist, Dr. Julian Reil, who ordered a STAT chest x-ray and a one time IV Lasix 40 mg.  Dr. Julian Reil came up to look over the patient himself.  The chest x-ray has been done and the IV lasix was given.  Will continue to monitor the patient.  Harriet Masson, RN

## 2023-02-20 NOTE — Evaluation (Signed)
Physical Therapy Evaluation Patient Details Name: Lisa [name] MRN: [id number] DOB: 1945-03-29 Today's Date: 02/20/2023  History of Present Illness  77 y/o F admitted 02/14/23 with RLE pain and discoloration, SOB, Hyperleukocytosis with Acute Myeloblastic Leukemia. 12/20 bone marrow biopsy. 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 PT with SPO2 88-95% on 4L with improved SPO2 once seated compared to supine. Pt normally is independent, caregiver for daughter and active. Pt states family works and cannot care for her at home. Pt with decreased transfers, gait and mobility who will benefit from acute therapy to maximize mobility, safety and independence. Encouraged continued attempts at OOB to chair and BSc. Pt will benefit from acute therapy to maximize mobility, safety and independence to decrease burden of care. Patient will benefit from continued inpatient follow up therapy, <3 hours/day         If plan is discharge home, recommend the following: A little help with walking and/or transfers;A lot of help with bathing/dressing/bathroom;Assistance with cooking/housework;Assist for transportation;Help with stairs or ramp for entrance   Can travel by private vehicle   Yes    Equipment Recommendations Wheelchair (measurements PT)  Recommendations for Other Services  OT consult    Functional Status Assessment Patient has had a recent decline in their functional status and demonstrates the ability to make significant improvements in function in a reasonable and predictable amount of time.     Precautions / Restrictions Precautions Precautions: Other (comment) Precaution Comments: watch sats, painful Rt foot      Mobility  Bed Mobility Overal bed mobility: Needs Assistance Bed Mobility: Supine to Sit     Supine to sit: Contact guard, HOB  elevated     General bed mobility comments: HOB 20 degrees, CGA to rise from surface with very limited HHA to elevate trunk from surface, increased time. seated rest EOB due to fatigue    Transfers Overall transfer level: Needs assistance   Transfers: Sit to/from Stand Sit to Stand: Contact guard assist           General transfer comment: CGA with single UE support on IV pole for stand and pivot to chair. Pt fatigued after transfers and declined further activity. SPO2 remained 94-95% on 4L    Ambulation/Gait               General Gait Details: not yet able  Stairs            Wheelchair Mobility     Tilt Bed    Modified Rankin (Stroke Patients Only)       Balance Overall balance assessment: Mild deficits observed, not formally tested                                           Pertinent Vitals/Pain Pain Assessment Pain Assessment: No/denies pain    Home Living Family/patient expects to be discharged to:: Private residence Living Arrangements: Children Available Help at Discharge: Available PRN/intermittently Type of Home: House Home Access: Stairs to enter   Secretary/administrator of Steps: 2   Home Layout: One level 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: Agricultural consultant (2 wheels);BSC/3in1;Shower seat Additional Comments: Lives with disabled daughter (assists daughter with IADLs)    Prior Function Prior Level of Function : Independent/Modified Independent;Driving  Mobility Comments: independent without AD, denies falls       Extremity/Trunk Assessment   Upper Extremity Assessment Upper Extremity Assessment: Overall WFL for tasks assessed    Lower Extremity Assessment Lower Extremity Assessment: Overall WFL for tasks assessed    Cervical / Trunk Assessment Cervical / Trunk Assessment: Normal  Communication   Communication Communication: No apparent difficulties  Cognition Arousal: Alert Behavior During Therapy: WFL for  tasks assessed/performed Overall Cognitive Status: Within Functional Limits for tasks assessed                                          General Comments      Exercises     Assessment/Plan    PT Assessment Patient needs continued PT services  PT Problem List Decreased strength;Decreased activity tolerance;Decreased balance;Decreased mobility;Cardiopulmonary status limiting activity       PT Treatment Interventions DME instruction;Gait training;Functional mobility training;Therapeutic activities;Therapeutic exercise;Patient/family education    PT Goals (Current goals can be found in the Care Plan section)  Acute Rehab PT Goals Patient Stated Goal: be strong enough to go home PT Goal Formulation: With patient/family Time For Goal Achievement: 03/06/23 Potential to Achieve Goals: Fair    Frequency Min 1X/week     Co-evaluation               AM-PAC PT "6 Clicks" Mobility  Outcome Measure Help needed turning from your back to your side while in a flat bed without using bedrails?: A Little Help needed moving from lying on your back to sitting on the side of a flat bed without using bedrails?: A Little Help needed moving to and from a bed to a chair (including a wheelchair)?: A Little Help needed standing up from a chair using your arms (e.g., wheelchair or bedside chair)?: A Little Help needed to walk in hospital room?: A Lot Help needed climbing 3-5 steps with a railing? : Total 6 Click Score: 15    End of Session Equipment Utilized During Treatment: Oxygen Activity Tolerance: Patient limited by fatigue Patient left: in chair;with call bell/phone within reach;with family/visitor present Nurse Communication: Mobility status PT Visit Diagnosis: Other abnormalities of gait and mobility (R26.89);Difficulty in walking, not elsewhere classified (R26.2)    Time: 1308-6578 PT Time Calculation (min) (ACUTE ONLY): 27 min   Charges:   PT Evaluation $PT  Eval Moderate Complexity: 1 Mod PT Treatments $Therapeutic Activity: 8-22 mins PT General Charges $$ ACUTE PT VISIT: 1 Visit         [name], PT Acute Rehabilitation Services Office: [phone]   [name] [name] 02/20/2023, 12:26 PM

## 2023-02-20 NOTE — Progress Notes (Signed)
PT Cancellation Note  Patient Details Name: Tillie Vansant MRN: [id number] DOB: 22-Dec-1945   Cancelled Treatment:    Reason Eval/Treat Not Completed: Other (comment) (pt reports too SOB and fatigued at present after up to toilet. Getting breating tx and will plan to reattempt)   Hillary Schwegler B Tazia Illescas 02/20/2023, 7:51 AM [name], PT Acute Rehabilitation Services Office: [phone]

## 2023-02-20 NOTE — Progress Notes (Addendum)
  Progress Note    02/20/2023 8:04 AM * No surgery found *  Subjective: SOB with minimal exertion   Vitals:   02/20/23 0413 02/20/23 0750  BP:    Pulse: (!) 56   Resp: (!) 22   Temp:    SpO2: 94% 94%   Physical Exam: Lungs:  non labored with 5L O2 by [place] Extremities:  R foot warm with motor and sensation intact Neurologic: A&O  CBC    Component Value Date/Time   WBC 58.0 (HH) 02/20/2023 0500   RBC 2.84 (L) 02/20/2023 0500   HGB 8.6 (L) 02/20/2023 0500   HCT 27.4 (L) 02/20/2023 0500   PLT 38 (L) 02/20/2023 0500   MCV 96.5 02/20/2023 0500   MCH 30.3 02/20/2023 0500   MCHC 31.4 02/20/2023 0500   RDW 20.8 (H) 02/20/2023 0500   LYMPHSABS 5.5 (H) 02/20/2023 0500   MONOABS 9.6 (H) 02/20/2023 0500   EOSABS 0.2 02/20/2023 0500   BASOSABS 0.1 02/20/2023 0500    BMET    Component Value Date/Time   NA 139 02/20/2023 0500   K 3.1 (L) 02/20/2023 0500   CL 105 02/20/2023 0500   CO2 23 02/20/2023 0500   GLUCOSE 137 (H) 02/20/2023 0500   BUN 13 02/20/2023 0500   CREATININE 1.27 (H) 02/20/2023 0500   CALCIUM 8.0 (L) 02/20/2023 0500   GFRNONAA 43 (L) 02/20/2023 0500   GFRAA >60 02/24/2018 1937    INR    Component Value Date/Time   INR 1.5 (H) 02/14/2023 2100     Intake/Output Summary (Last 24 hours) at 02/20/2023 0804 Last data filed at 02/20/2023 0353 Gross per 24 hour  Intake 450 ml  Output 3300 ml  Net -2850 ml     Assessment/Plan:  77 y.o. female with R SFA thrombus; blue toes  Patient continues to be SOB with minimal exertion. Diuresis ongoing.  Blue toes of R foot stable.  Motor and sensation remains intact.  Rest pain controlled on current regimen.  Given her respiratory status and improvement in R foot pain, angiogram will be cancelled today.  Patient and family are now interested in transfer to [facility] for ongoing leukemia treatment.  Vascular will continue to follow while in house.   Emilie Rutter, PA-C [facility] [phone] 02/20/2023 8:04 AM  VASCULAR STAFF ADDENDUM: I have independently interviewed and examined the patient. I agree with the above.   Rande Brunt. Lenell Antu, MD [facility] Phone Number: [phone] 02/20/2023 11:24 AM

## 2023-02-20 NOTE — Consult Note (Signed)
PHARMACY - ANTICOAGULATION CONSULT NOTE  Pharmacy Consult for IV Heparin Indication:  Occlusion superficial femoral artery  Patient Measurements: Height: 5\' 7"  (170.2 cm) Weight: 95.3 kg (210 lb 1.6 oz) IBW/kg (Calculated) : 61.6 Heparin Dosing Weight: 77.3 kg  Labs: Recent Labs    02/17/23 2000 02/17/23 2000 02/18/23 0515 02/19/23 0500 02/20/23 0500  HGB  --    < > 9.1* 8.8* 8.6*  HCT  --   --  29.7* 28.6* 27.4*  PLT  --   --  42* 38* 38*  APTT 70*  --   --   --   --   HEPARINUNFRC 0.37  --  0.38 0.30 0.26*  CREATININE  --   --  1.20* 0.99 1.27*   < > = values in this interval not displayed.    Estimated Creatinine Clearance: 42.6 mL/min (A) (by C-G formula based on SCr of 1.27 mg/dL (H)).  Medical History: Past Medical History:  Diagnosis Date   Asthma    COPD (chronic obstructive pulmonary disease) (HCC)    Coronary artery disease    Cough    " ALL MY LIFE "   Dyspnea    GERD (gastroesophageal reflux disease)    History of blood clots    patient had a blood clot in her kidney 01/03/2023.  pt is on Eliquis   Hypertension    NSTEMI (non-ST elevated myocardial infarction) (HCC) 09/01/2016   DES to Cx/OM bifurcation   Unstable angina (HCC) 08/2016   Medications:  Apixaban 5 mg BID prior to admission, last dose 12/17 0700  Assessment: 77 y/o F with medical history as above and including history of blood clots on apixaban presenting with right leg pain with swelling. 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 Pharmacy consulted to initiate and manage heparin infusion for occlusion superficial femoral artery. Previously held heparin for bone marrow biopsy and port placement procedure but has  been restarted  -heparin level below goal on 1450 units/hr -hg= 8.6, plt= 38 -Anticipate angiography and possible intervention 12/23  Goal of Therapy:  Heparin level 0.3-0.5 units/ml Monitor platelets by  anticoagulation protocol: Yes   Plan:  Increase heparin infusion to 1550 units/hr Will follow plans post procedure  Harland German, PharmD Clinical Pharmacist **Pharmacist phone directory can now be found on [email or web address] ([email or web address]).  Listed under [facility].

## 2023-02-20 NOTE — Progress Notes (Signed)
Care[facility] was canceled per MD order  Lawson Radar, RN

## 2023-02-20 NOTE — Evaluation (Signed)
Occupational Therapy Evaluation Patient Details Name: Lisa [name] MRN: [id number] DOB: 05-21-1945 Today's Date: 02/20/2023   History of Present Illness 77 y/o F admitted 02/14/23 with RLE pain and discoloration, SOB, Hyperleukocytosis with Acute Myeloblastic Leukemia. 12/20 bone marrow biopsy. PMhx: renal infarct 12/28/22, COPD, tobacco abuse, HTN, CAD   Clinical Impression   Pt presents with decline in function and safety with ADLs and ADL mobility with impaired strength and endurance.  Pt  limited by fatigue, coughing and SOB with all activity. Pt returning to bed upon OT arrival. Pt got OOB with PT before lunch. PTA, pt was Ind with ADLs/selfcare and IADLs, pt assists her daughter with IADLs and states family works and cannot care for her at home. Pt with decreased ADLs and ADL mobility/transfers and would benefit from acute OT services to maximize mobility, safety and independence.      If plan is discharge home, recommend the following: A lot of help with bathing/dressing/bathroom;A little help with walking and/or transfers;Assistance with cooking/housework;Assist for transportation;Help with stairs or ramp for entrance    Functional Status Assessment  Patient has had a recent decline in their functional status and demonstrates the ability to make significant improvements in function in a reasonable and predictable amount of time.  Equipment Recommendations  Other (comment) (TBD at next venue of care)    Recommendations for Other Services       Precautions / Restrictions Precautions Precautions: Other (comment) Precaution Comments: watch sats, painful Rt foot Restrictions Weight Bearing Restrictions Per Provider Order: No      Mobility Bed Mobility Overal bed mobility: Needs Assistance Bed Mobility: Sit to Supine       Sit to supine: Contact guard assist   General bed mobility comments: pt in recliner getting ready to return to bed    Transfers Overall transfer  level: Needs assistance   Transfers: Sit to/from Stand Sit to Stand: Contact guard assist                  Balance Overall balance assessment: Mild deficits observed, not formally tested                                         ADL either performed or assessed with clinical judgement   ADL                                         General ADL Comments: deferred, pt returning to bed due to fatigue. Up with PT ~2-3 hours prior     Vision Ability to See in Adequate Light: 0 Adequate Patient Visual Report: No change from baseline       Perception         Praxis         Pertinent Vitals/Pain Pain Assessment Pain Assessment: 0-10 Pain Score: 8  Pain Location: R foot Pain Descriptors / Indicators: Throbbing Pain Intervention(s): Limited activity within patient's tolerance, Monitored during session     Extremity/Trunk Assessment Upper Extremity Assessment Upper Extremity Assessment: Overall WFL for tasks assessed   Lower Extremity Assessment Lower Extremity Assessment: Defer to PT evaluation   Cervical / Trunk Assessment Cervical / Trunk Assessment: Normal   Communication Communication Communication: No apparent difficulties   Cognition Arousal: Alert Behavior During Therapy: WFL for tasks assessed/performed Overall Cognitive  Status: Within Functional Limits for tasks assessed                                       General Comments       Exercises     Shoulder Instructions      Home Living Family/patient expects to be discharged to:: Private residence Living Arrangements: Children Available Help at Discharge: Available PRN/intermittently Type of Home: House Home Access: Stairs to enter Secretary/administrator of Steps: 2 Entrance Stairs-Rails: Right Home Layout: One level     Bathroom Shower/Tub: Producer, television/film/video: Standard     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: Agricultural consultant (2  wheels);BSC/3in1;Shower seat   Additional Comments: Lives with disabled daughter (assists daughter with IADLs)      Prior Functioning/Environment Prior Level of Function : Independent/Modified Independent;Driving             Mobility Comments: independent without AD, denies falls ADLs Comments: Independent, drives, cooks        OT Problem List: Decreased strength;Pain;Decreased activity tolerance;Decreased knowledge of use of DME or AE      OT Treatment/Interventions: Self-care/ADL training;DME and/or AE instruction;Therapeutic activities;Balance training;Patient/family education;Energy conservation    OT Goals(Current goals can be found in the care plan section) Acute Rehab OT Goals Patient Stated Goal: "get well" OT Goal Formulation: With patient/family Time For Goal Achievement: 03/06/23 ADL Goals Pt Will Perform Grooming: with contact guard assist;with supervision;with set-up;standing Pt Will Perform Upper Body Bathing: with set-up;sitting Pt Will Perform Lower Body Bathing: with min assist;with caregiver independent in assisting Pt Will Perform Upper Body Dressing: with set-up;sitting Pt Will Transfer to Toilet: with contact guard assist;with supervision Pt Will Perform Toileting - Clothing Manipulation and hygiene: with min assist;with contact guard assist;sit to/from stand;with caregiver independent in assisting  OT Frequency: Min 1X/week    Co-evaluation              AM-PAC OT "6 Clicks" Daily Activity     Outcome Measure                 End of Session    Activity Tolerance: Patient limited by fatigue Patient left: in bed;with family/visitor present  OT Visit Diagnosis: Muscle weakness (generalized) (M62.81);Pain Pain - Right/Left: Right Pain - part of body: Ankle and joints of foot                Time: 4098-1191 OT Time Calculation (min): 21 min Charges:  OT General Charges $OT Visit: 1 Visit OT Evaluation $OT Eval Moderate Complexity: 1  Mod    Galen Manila 02/20/2023, 3:17 PM

## 2023-02-20 NOTE — Progress Notes (Addendum)
Administered tylenol for pain of 8 in the scale of 0-10 per patient and family requested.   Lawson Radar, RN

## 2023-02-20 NOTE — Progress Notes (Signed)
Daily Progress Note   [name]       Date: 02/20/2023 DOB: 1945-12-23  Age: 77 y.o. MRN#: [id number] Attending Physician: Alberteen Sam, * Primary Care Physician: Kirstie Peri, MD Admit Date: 02/14/2023  Reason for Consultation/Follow-up: Establishing goals of care  Patient Profile/HPI:  77 y.o. female  with past medical history of CAD with history of PCI, PAD, HFpEF, and COPD admitted on 02/14/2023 with a painful right foot with cyanotic changes and numbness in her toes.    Found to have imaging concerning for occlusion of distal right SFA. Admitted to hospital with vascular following. Oncology consulting given acute leukemia.    Patient faces treatment option decisions, advanced directive decisions, and anticipatory care needs.  Subjective: Chart reviewed including labs, progress notes, imaging from this and previous encounters.  Patient sitting up in chair- states she is ready to get back in bed.  No questions or thoughts regarding Palliative meeting yesterday.  They are awaiting transfer to [facility].   Review of Systems  Constitutional:  Positive for malaise/fatigue.     Physical Exam Vitals and nursing note reviewed.  Constitutional:      General: She is not in acute distress.    Appearance: She is ill-appearing.  Cardiovascular:     Rate and Rhythm: Normal rate.  Pulmonary:     Effort: Pulmonary effort is normal.  Neurological:     Mental Status: She is alert and oriented to person, place, and time.             Vital Signs: BP (!) 95/58 (BP Location: Left Arm)   Pulse 77   Temp 98.3 F (36.8 C) (Oral)   Resp 20   Ht 5\' 7"  (1.702 m)   Wt 95.3 kg   LMP 09/01/2016 (LMP Unknown)   SpO2 92%   BMI 32.91 kg/m  SpO2: SpO2: 92  % O2 Device: O2 Device: Nasal Cannula O2 Flow Rate: O2 Flow Rate (L/min): 4 L/min  Intake/output summary:  Intake/Output Summary (Last 24 hours) at 02/20/2023 1338 Last data filed at 02/20/2023 1100 Gross per 24 hour  Intake 440 ml  Output 4550 ml  Net -4110 ml   LBM: Last BM Date : 02/18/23 Baseline Weight: Weight: 78 kg Most recent weight: Weight: 95.3 kg  Palliative Assessment/Data: PPS: 40%      Patient Active Problem List   Diagnosis Date Noted   Paroxysmal atrial fibrillation (HCC) 02/19/2023   CAP (community acquired pneumonia) 02/19/2023   Acute on chronic diastolic CHF (congestive heart failure) (HCC) 02/19/2023   Essential hypertension 02/19/2023   AML (acute myeloblastic leukemia) (HCC) 02/15/2023   Acquired thrombophilia (HCC) 02/15/2023   Hyperleukocytosis 02/15/2023   Arterial occlusion, lower extremity (HCC) 02/14/2023   Rectal bleeding 01/13/2023   Hemorrhoids 01/13/2023   HSV (herpes simplex virus) dendritic keratitis 01/02/2023   History of Renal infarction 12/28/2022   Chest pain due to CAD (HCC) 02/09/2017   Glaucoma 11/24/2016   Diverticulitis 10/11/2016   Coronary artery disease involving native coronary artery of native heart without angina pectoris    Chest pain 09/30/2016   Non-ST elevation (NSTEMI) myocardial infarction (HCC) 09/06/2016   Coronary artery disease involving native coronary artery of native heart with unstable angina pectoris (HCC)    Hyperlipidemia LDL goal <70    Hypokalemia    Unstable angina (HCC) 08/31/2016   GERD (gastroesophageal reflux disease) 08/31/2016   COPD (chronic obstructive pulmonary disease) (HCC) 08/31/2016   Tobacco abuse 08/31/2016   Chronic cough 08/31/2016    Palliative Care Assessment & Plan    Assessment/Recommendations/Plan  Awaiting transfer to tertiary care center Recommend continuing Palliative discussions at next venue   Code Status: DNR  Prognosis:  Unable to  determine  Discharge Planning: Transfer to tertiary care center  Care plan was discussed with patient and family  Thank you for allowing the Palliative Medicine Team to assist in the care of this patient.  Total time:  30 minutes Prolonged billing:  Time includes:   Preparing to see the patient (e.g., review of tests) Obtaining and/or reviewing separately obtained history Performing a medically necessary appropriate examination and/or evaluation Counseling and educating the patient/family/caregiver Ordering medications, tests, or procedures Referring and communicating with other health care professionals (when not reported separately) Documenting clinical information in the electronic or other health record Independently interpreting results (not reported separately) and communicating results to the patient/family/caregiver Care coordination (not reported separately) Clinical documentation  Ocie Bob, AGNP-C Palliative Medicine   Please contact Palliative Medicine Team phone at [phone] for questions and concerns.

## 2023-02-21 ENCOUNTER — Inpatient Hospital Stay (HOSPITAL_COMMUNITY): Payer: Medicare Other

## 2023-02-21 DIAGNOSIS — A419 Sepsis, unspecified organism: Secondary | ICD-10-CM | POA: Diagnosis not present

## 2023-02-21 DIAGNOSIS — R918 Other nonspecific abnormal finding of lung field: Secondary | ICD-10-CM | POA: Diagnosis not present

## 2023-02-21 DIAGNOSIS — R29810 Facial weakness: Secondary | ICD-10-CM | POA: Diagnosis not present

## 2023-02-21 DIAGNOSIS — N179 Acute kidney failure, unspecified: Secondary | ICD-10-CM | POA: Diagnosis not present

## 2023-02-21 DIAGNOSIS — I110 Hypertensive heart disease with heart failure: Secondary | ICD-10-CM | POA: Diagnosis not present

## 2023-02-21 DIAGNOSIS — F17210 Nicotine dependence, cigarettes, uncomplicated: Secondary | ICD-10-CM | POA: Diagnosis not present

## 2023-02-21 DIAGNOSIS — Z79899 Other long term (current) drug therapy: Secondary | ICD-10-CM | POA: Diagnosis not present

## 2023-02-21 DIAGNOSIS — I70221 Atherosclerosis of native arteries of extremities with rest pain, right leg: Secondary | ICD-10-CM | POA: Diagnosis not present

## 2023-02-21 DIAGNOSIS — E119 Type 2 diabetes mellitus without complications: Secondary | ICD-10-CM | POA: Diagnosis not present

## 2023-02-21 DIAGNOSIS — R571 Hypovolemic shock: Secondary | ICD-10-CM | POA: Diagnosis not present

## 2023-02-21 DIAGNOSIS — I498 Other specified cardiac arrhythmias: Secondary | ICD-10-CM | POA: Diagnosis not present

## 2023-02-21 DIAGNOSIS — Z9861 Coronary angioplasty status: Secondary | ICD-10-CM | POA: Diagnosis not present

## 2023-02-21 DIAGNOSIS — I672 Cerebral atherosclerosis: Secondary | ICD-10-CM | POA: Diagnosis not present

## 2023-02-21 DIAGNOSIS — I480 Paroxysmal atrial fibrillation: Secondary | ICD-10-CM | POA: Diagnosis not present

## 2023-02-21 DIAGNOSIS — I4891 Unspecified atrial fibrillation: Secondary | ICD-10-CM | POA: Diagnosis not present

## 2023-02-21 DIAGNOSIS — J9601 Acute respiratory failure with hypoxia: Secondary | ICD-10-CM | POA: Diagnosis not present

## 2023-02-21 DIAGNOSIS — Z515 Encounter for palliative care: Secondary | ICD-10-CM | POA: Diagnosis not present

## 2023-02-21 DIAGNOSIS — K219 Gastro-esophageal reflux disease without esophagitis: Secondary | ICD-10-CM | POA: Diagnosis not present

## 2023-02-21 DIAGNOSIS — I70209 Unspecified atherosclerosis of native arteries of extremities, unspecified extremity: Secondary | ICD-10-CM | POA: Diagnosis not present

## 2023-02-21 DIAGNOSIS — J440 Chronic obstructive pulmonary disease with acute lower respiratory infection: Secondary | ICD-10-CM | POA: Diagnosis not present

## 2023-02-21 DIAGNOSIS — R579 Shock, unspecified: Secondary | ICD-10-CM | POA: Diagnosis not present

## 2023-02-21 DIAGNOSIS — I440 Atrioventricular block, first degree: Secondary | ICD-10-CM | POA: Diagnosis not present

## 2023-02-21 DIAGNOSIS — R230 Cyanosis: Secondary | ICD-10-CM | POA: Diagnosis not present

## 2023-02-21 DIAGNOSIS — J810 Acute pulmonary edema: Secondary | ICD-10-CM | POA: Diagnosis not present

## 2023-02-21 DIAGNOSIS — C9200 Acute myeloblastic leukemia, not having achieved remission: Secondary | ICD-10-CM | POA: Diagnosis not present

## 2023-02-21 DIAGNOSIS — N280 Ischemia and infarction of kidney: Secondary | ICD-10-CM | POA: Diagnosis not present

## 2023-02-21 DIAGNOSIS — J90 Pleural effusion, not elsewhere classified: Secondary | ICD-10-CM | POA: Diagnosis not present

## 2023-02-21 DIAGNOSIS — I2510 Atherosclerotic heart disease of native coronary artery without angina pectoris: Secondary | ICD-10-CM | POA: Diagnosis not present

## 2023-02-21 DIAGNOSIS — I517 Cardiomegaly: Secondary | ICD-10-CM | POA: Diagnosis not present

## 2023-02-21 DIAGNOSIS — I5033 Acute on chronic diastolic (congestive) heart failure: Secondary | ICD-10-CM | POA: Diagnosis not present

## 2023-02-21 DIAGNOSIS — Z66 Do not resuscitate: Secondary | ICD-10-CM | POA: Diagnosis not present

## 2023-02-21 DIAGNOSIS — I743 Embolism and thrombosis of arteries of the lower extremities: Secondary | ICD-10-CM | POA: Diagnosis not present

## 2023-02-21 DIAGNOSIS — I998 Other disorder of circulatory system: Secondary | ICD-10-CM | POA: Diagnosis not present

## 2023-02-21 DIAGNOSIS — I75021 Atheroembolism of right lower extremity: Secondary | ICD-10-CM | POA: Diagnosis not present

## 2023-02-21 DIAGNOSIS — D6959 Other secondary thrombocytopenia: Secondary | ICD-10-CM | POA: Diagnosis not present

## 2023-02-21 DIAGNOSIS — R200 Anesthesia of skin: Secondary | ICD-10-CM | POA: Diagnosis not present

## 2023-02-21 LAB — CBC WITH DIFFERENTIAL/PLATELET
Abs Immature Granulocytes: 0.58 10*3/uL — ABNORMAL HIGH (ref 0.00–0.07)
Basophils Absolute: 0.1 10*3/uL (ref 0.0–0.1)
Basophils Relative: 0 %
Eosinophils Absolute: 0.1 10*3/uL (ref 0.0–0.5)
Eosinophils Relative: 0 %
HCT: 29.1 % — ABNORMAL LOW (ref 36.0–46.0)
Hemoglobin: 9.2 g/dL — ABNORMAL LOW (ref 12.0–15.0)
Immature Granulocytes: 1 %
Lymphocytes Relative: 8 %
Lymphs Abs: 5.7 10*3/uL — ABNORMAL HIGH (ref 0.7–4.0)
MCH: 30.2 pg (ref 26.0–34.0)
MCHC: 31.6 g/dL (ref 30.0–36.0)
MCV: 95.4 fL (ref 80.0–100.0)
Monocytes Absolute: 19.3 10*3/uL — ABNORMAL HIGH (ref 0.1–1.0)
Monocytes Relative: 28 %
Neutro Abs: 42.2 10*3/uL — ABNORMAL HIGH (ref 1.7–7.7)
Neutrophils Relative %: 63 %
Platelets: 40 10*3/uL — ABNORMAL LOW (ref 150–400)
RBC: 3.05 MIL/uL — ABNORMAL LOW (ref 3.87–5.11)
RDW: 20.4 % — ABNORMAL HIGH (ref 11.5–15.5)
Smear Review: DECREASED
WBC: 67.9 10*3/uL (ref 4.0–10.5)
nRBC: 0.2 % (ref 0.0–0.2)

## 2023-02-21 LAB — HEPARIN LEVEL (UNFRACTIONATED): Heparin Unfractionated: 0.36 [IU]/mL (ref 0.30–0.70)

## 2023-02-21 LAB — BASIC METABOLIC PANEL
Anion gap: 12 (ref 5–15)
BUN: 16 mg/dL (ref 8–23)
CO2: 28 mmol/L (ref 22–32)
Calcium: 8.3 mg/dL — ABNORMAL LOW (ref 8.9–10.3)
Chloride: 100 mmol/L (ref 98–111)
Creatinine, Ser: 1.19 mg/dL — ABNORMAL HIGH (ref 0.44–1.00)
GFR, Estimated: 47 mL/min — ABNORMAL LOW (ref >60)
Glucose, Bld: 131 mg/dL — ABNORMAL HIGH (ref 70–99)
Potassium: 3.5 mmol/L (ref 3.5–5.1)
Sodium: 140 mmol/L (ref 135–145)

## 2023-02-21 LAB — CULTURE, RESPIRATORY W GRAM STAIN

## 2023-02-21 LAB — SURGICAL PATHOLOGY

## 2023-02-21 LAB — LEGIONELLA PNEUMOPHILA SEROGP 1 UR AG: L. pneumophila Serogp 1 Ur Ag: NEGATIVE

## 2023-02-21 LAB — PATHOLOGIST SMEAR REVIEW

## 2023-02-21 MED ORDER — FUROSEMIDE 10 MG/ML IJ SOLN
40.0000 mg | Freq: Two times a day (BID) | INTRAMUSCULAR | Status: DC
  Administered 2023-02-21: 40 mg via INTRAVENOUS
  Filled 2023-02-21: qty 4

## 2023-02-21 MED ORDER — OXYCODONE HCL 5 MG PO TABS: 5.0000 mg | ORAL_TABLET | ORAL | Status: DC | PRN

## 2023-02-21 MED ORDER — LEVOFLOXACIN 750 MG PO TABS: 750.0000 mg | ORAL_TABLET | ORAL | Status: DC

## 2023-02-21 NOTE — Progress Notes (Signed)
Brief Progress Note  Spoke to Mr. Finkley daughter today via the telephone regarding her mother's care.  The patient is interested in a transfer to [facility] at this time.  We discussed that the treatments we are offering here including hydroxyurea are more temporizing measures and that if she wanted definitive treatment she would need to be evaluated at [facility].  The patient and her daughter voiced understanding of our plans.  We also noted that if she preferred a more comfort care/hospice based route we could keep her here and continue treatment.  They voiced their desire to proceed with a transfer to [facility].  We are available for any other questions or concerns regarding the care of this patient.  Ulysees Barns, MD Department of Hematology/Oncology [facility] at [facility] Phone: [phone] Pager: [phone] Email: [email or web address]

## 2023-02-21 NOTE — Progress Notes (Signed)
Almost an hour after the IV lasix was given, the patient states she feels better and isn't short of breath now.  Her lungs don't sound as wet now.  Will continue to monitor.  Harriet Masson, RN

## 2023-02-21 NOTE — Progress Notes (Signed)
Patient transported by [facility]

## 2023-02-21 NOTE — Care Management Important Message (Signed)
Important Message  Patient Details  Name: Lisa [name] MRN: [id number] Date of Birth: 1945-12-26   Important Message Given:  Yes - Medicare IM     [name] 02/21/2023, 10:24 AM

## 2023-02-21 NOTE — Progress Notes (Signed)
Patient is going to bed no C613,Cancer center ,pt report given to [facility]All questions were answered

## 2023-02-21 NOTE — Progress Notes (Signed)
[facility] has called the care link for transportation at around 1150

## 2023-02-21 NOTE — Progress Notes (Signed)
Called [facility] to ask for the update in the care link, the floor RN state the care link had to receive emergency case earlier so they could not come to receive the patient and she added they will clean the critical care transport and will be available to receive the patient but she is not sure about the time of heir arrival ,RN updated the patient and her daughter

## 2023-02-21 NOTE — Consult Note (Signed)
PHARMACY - ANTICOAGULATION CONSULT NOTE  Pharmacy Consult for IV Heparin Indication:  Occlusion superficial femoral artery  Patient Measurements: Height: 5\' 7"  (170.2 cm) Weight: 85 kg (187 lb 6.3 oz) IBW/kg (Calculated) : 61.6 Heparin Dosing Weight: 77.3 kg  Labs: Recent Labs    02/19/23 0500 02/20/23 0500 02/21/23 0353  HGB 8.8* 8.6* 9.2*  HCT 28.6* 27.4* 29.1*  PLT 38* 38* 40*  HEPARINUNFRC 0.30 0.26* 0.36  CREATININE 0.99 1.27* 1.19*    Estimated Creatinine Clearance: 43 mL/min (A) (by C-G formula based on SCr of 1.19 mg/dL (H)).  Medical History: Past Medical History:  Diagnosis Date   Asthma    COPD (chronic obstructive pulmonary disease) (HCC)    Coronary artery disease    Cough    " ALL MY LIFE "   Dyspnea    GERD (gastroesophageal reflux disease)    History of blood clots    patient had a blood clot in her kidney 01/03/2023.  pt is on Eliquis   Hypertension    NSTEMI (non-ST elevated myocardial infarction) (HCC) 09/01/2016   DES to Cx/OM bifurcation   Unstable angina (HCC) 08/2016   Medications:  Apixaban 5 mg BID prior to admission, last dose 12/17 0700  Assessment: 77 y/o F with medical history as above and including history of blood clots on apixaban presenting with right leg pain with swelling. 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 Pharmacy consulted to initiate and manage heparin infusion for occlusion superficial femoral artery. Previously held heparin for bone marrow biopsy and port placement procedure but has  been restarted  Heparin level therapeutic (0.36) on 1550 units/hr. No issues with heparin infusion or signs of bleeding per RN. -hgb= 9.2, plt= 40 -Angiography and possible intervention rescheduled for 04/2023  Goal of Therapy:  Heparin level 0.3-0.5 units/ml Monitor platelets by anticoagulation protocol: Yes   Plan:  Continue heparin infusion to 1550  units/hr Monitor daily heparin level and CBC Continue to monitor H&H and s/sx of bleeding  Verdene Rio, PharmD PGY1 Pharmacy Resident

## 2023-02-21 NOTE — Progress Notes (Signed)
PHARMACY NOTE:  ANTIMICROBIAL RENAL DOSAGE ADJUSTMENT  Current antimicrobial regimen includes a mismatch between antimicrobial dosage and estimated renal function.  As per policy approved by the Pharmacy & Therapeutics and Medical Executive Committees, the antimicrobial dosage will be adjusted accordingly.  Current antimicrobial dosage:  levofloxacin 750 mg PO daily  Indication: CAP  Renal Function:  Estimated Creatinine Clearance: 43 mL/min (A) (by C-G formula based on SCr of 1.19 mg/dL (H)). []      On intermittent HD, scheduled: []      On CRRT    Antimicrobial dosage has been changed to:  levofloxacin 750 mg PO every 48 hours  Additional comments: Per MD, course to be completed on 02/24/23   Thank you for allowing pharmacy to be a part of this patient's care.  Burnett Kanaris, [facility] 02/21/2023 2:09 PM

## 2023-02-21 NOTE — Progress Notes (Addendum)
  Progress Note    02/21/2023 8:29 AM * No surgery found *  Subjective:  transfer to [facility] cancelled yesterday evening due to SOB requiring IV lasix.  Patient believes she is breathing better this morning.  Denies any significant R foot pain.   Vitals:   02/21/23 0513 02/21/23 0724  BP: (!) 106/54 (!) 110/52  Pulse: 77 77  Resp: (!) 23 20  Temp: 99.5 F (37.5 C) 98.5 F (36.9 C)  SpO2: 93% 96%   Physical Exam: Lungs:  non labored on nasal cannula Extremities:  brisk R ATA by doppler; motor and sensation intact R foot Neurologic: a&O  CBC    Component Value Date/Time   WBC 67.9 (HH) 02/21/2023 0353   RBC 3.05 (L) 02/21/2023 0353   HGB 9.2 (L) 02/21/2023 0353   HCT 29.1 (L) 02/21/2023 0353   PLT 40 (L) 02/21/2023 0353   MCV 95.4 02/21/2023 0353   MCH 30.2 02/21/2023 0353   MCHC 31.6 02/21/2023 0353   RDW 20.4 (H) 02/21/2023 0353   LYMPHSABS 5.7 (H) 02/21/2023 0353   MONOABS 19.3 (H) 02/21/2023 0353   EOSABS 0.1 02/21/2023 0353   BASOSABS 0.1 02/21/2023 0353    BMET    Component Value Date/Time   NA 140 02/21/2023 0353   K 3.5 02/21/2023 0353   CL 100 02/21/2023 0353   CO2 28 02/21/2023 0353   GLUCOSE 131 (H) 02/21/2023 0353   BUN 16 02/21/2023 0353   CREATININE 1.19 (H) 02/21/2023 0353   CALCIUM 8.3 (L) 02/21/2023 0353   GFRNONAA 47 (L) 02/21/2023 0353   GFRAA >60 02/24/2018 1937    INR    Component Value Date/Time   INR 1.5 (H) 02/14/2023 2100     Intake/Output Summary (Last 24 hours) at 02/21/2023 0829 Last data filed at 02/21/2023 0500 Gross per 24 hour  Intake 1670.66 ml  Output 2850 ml  Net -1179.34 ml     Assessment/Plan:  77 y.o. female with R SFA thrombus, blue toes  Subjectively breathing better this morning Continues to be without significant pain in R foot.  No indication for urgent intervention.  Discoloration in the toes seems to be improving by my exam.  She has motor and sensation intact in the foot. Plans noted to transfer  to [facility] for treatment of acute leukemia   Emilie Rutter, PA-C [facility] [phone] 02/21/2023 8:29 AM   VASCULAR STAFF ADDENDUM: I have independently interviewed and examined the patient. I agree with the above.  Will follow while she is in house.  Rande Brunt. Lenell Antu, MD [facility] Phone Number: [phone] 02/21/2023 1:39 PM

## 2023-02-21 NOTE — Discharge Summary (Addendum)
Physician Discharge Summary   Patient: Lisa [name] MRN: [id number] DOB: 06/01/1945  Admit date:     02/14/2023  Discharge date: 02/21/23  Discharge Physician: Alberteen Sam   PCP: Kirstie Peri, MD     Recommendations at discharge:  Transferred to [facility] for Heme-Onc evaluation for AML Please consult Vascular Surgery for right SFA stenosis and ischemia Please continue diuresis Levaquin for pneumonia was started 02/18/23, was dosed on 02/21/23, and with renal-dosing, a final dose of Levaquin 750 mg PO on Thursday 02/23/23 would complete 7 days     Discharge Diagnoses: Principal Problem:   Arterial occlusion, lower extremity (HCC) Active Problems:   AML (acute myeloblastic leukemia) (HCC)   Acute respiratory failure with hypoxia due to CHF and pneumonia   Acute on chronic diastolic CHF (congestive heart failure) (HCC)   Pneumonia due to GBS   Paroxysmal atrial fibrillation (HCC)   COPD (chronic obstructive pulmonary disease) (HCC)   Essential hypertension   Coronary artery disease involving native coronary artery of native heart with unstable angina pectoris (HCC)   GERD (gastroesophageal reflux disease)   Hypokalemia   Delirium      Hospital Course: 77 y.o. 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  In the ER, noted to have new (since Nov) leukemoid reaction, anemia and thrombocytopenia.  Admitted on heparin, Oncology and Vascular surgery consulted.     Significant events: 12/17: Admitted to [facility], transferred to [facility] for Vascular surgery evaluation 12/18: Oncology consulted; Flow cytometry of peripheral blood shows blastic/monocytic population; Transfer to tertiary center recommended, patient deferred; Hydrea started 12/20: Bone marrow biopsy completed; Palliative Care  consulted 12/22: More short of breath and hypoxic, BID IV Lasix started 12/23: Patient requesting transfer to tertiary center; transfer pending but held due to hypotension 12/24: Hypotension resolved overnight; CT head ruled out ICH   Significant studies: 12/17 CTA chest: No PE, bilateral patchy ground glass in lung bases, lymphadenopathy 12/17 CTA aorto-bifem: Occlusion of the distal right superficial femoral artery with reconstitution of the popliteal artery 12/20 CXR: emphysema, no infiltrates 12/23 CXR: new infiltrates, c/w edema 12/24 CT head: NAICP   Significant microbiology data: 12/17 blood culture x2: NG 12/18 respiratory virus panel: pan-negative 12/21 respiratory culture: Group B strep 12/21 blood cx x2: NG    Procedures: 12/20 Bone marrow biopsy    Consults: Oncology Vascular surgery Palliative Care Interventional radiology         * AML (acute myeloid leukemia) (HCC) Peripheral flow cytometry consistent with new AML.    Bone marrow biopsy pending on 12/20.  S/p right internal jugular vein port catheter placement.  - Continue hydroxyurea 2000 mg daily.   - Transfer to [facility]   Arterial occlusion, lower extremity (HCC) Acute limb ischemia Admitted and heparin gtt started.  Statin intolerant.  Aspirin on hold due to thrombocytopenia  Vascular surgery consulted.  CT angiography showed high grade stenosis vs occluded SFA.  Possibly thromboembolic.  Has had waxing and waning pain, mostly controlled with tramadol.  Plan was for angiography with possible embolectomy/stent on 12/23, but given concern regarding her ability to lie flat for extended procedures given her hypoxia, decision was made to continue heparin and defer angiography/embolectomy for now.    - Continue heparin drip - Consult Vascular surgery after transfer    Acute on chronic  diastolic CHF (congestive heart failure) (HCC) Echo last Oct showed EF normal, grade II DD.    Developed  worsening dyspnea 12/22.  CXR 12/22 showed edema, IV Lasix started.  So far net negative 3L in the last 48 hours.  Still on 4L O2.   - Continue IV Lasix  BID  COPD (chronic obstructive pulmonary disease) (HCC) Community acquired pneumonia due to group B strep Possible bilateral pneumonia at the lower lobes. Patient on antibiotic therapy with levofloxacin for community acquired pneumonia. (Sepsis ruled out)     Paroxysmal atrial fibrillation (HCC) Long-term on Apixaban, currently on hold while on heparin drip.  Rate controlled.  Essential hypertension On metoprolol  Coronary artery disease involving native coronary artery of native heart with unstable angina pectoris (HCC) No acute coronary syndrome.   GERD (gastroesophageal reflux disease) Continue with pantoprazole.   Hypokalemia Supplement K           The [facility] Controlled Substances Registry was reviewed for this patient prior to discharge.    Disposition: Transfer to [facility]   DISCHARGE MEDICATION: Allergies as of 02/21/2023       Reactions   Brilinta [ticagrelor] Shortness Of Breath   Penicillins Anaphylaxis   immediate rash, facial/tongue/throat swelling, SOB or lightheadedness with hypotension severe rash involving mucus membranes or skin necrosis. PharmD confirmed allergy 02/18/23 - no cephalosporins tried in the past.   Ranexa [ranolazine] Shortness Of Breath   Sulfa Antibiotics Anaphylaxis, Rash   Blistering rash on hands and feet   Crestor [rosuvastatin] Other (See Comments)   Myalgias        Medication List     STOP taking these medications    apixaban 5 MG Tabs tablet Commonly known as: ELIQUIS   esomeprazole 20 MG capsule Commonly known as: NEXIUM Replaced by: pantoprazole 40 MG tablet   furosemide 20 MG tablet Commonly known as: LASIX Replaced by: furosemide 10 MG/ML injection   lisinopril 20 MG tablet Commonly known as: ZESTRIL   metoprolol succinate 25 MG 24 hr  tablet Commonly known as: Toprol XL       TAKE these medications    acetaminophen 500 MG tablet Commonly known as: TYLENOL Take 1,000 mg by mouth every 6 (six) hours as needed for moderate pain.   allopurinol 300 MG tablet Commonly known as: ZYLOPRIM Take 1 tablet (300 mg total) by mouth daily.   budesonide 0.25 MG/2ML nebulizer solution Commonly known as: PULMICORT Take 2 mLs (0.25 mg total) by nebulization 2 (two) times daily.   Chlorhexidine Gluconate Cloth 2 % Pads Apply 6 each topically daily.   CoQ10 100 MG Caps Take 100 mg by mouth daily.   diazepam 2 MG tablet Commonly known as: VALIUM Take 2 mg by mouth daily as needed for anxiety.   docusate sodium 100 MG capsule Commonly known as: COLACE Take 100 mg by mouth daily.   EPINEPHrine 0.3 mg/0.3 mL Soaj injection Commonly known as: EPI-PEN Inject 0.3 mg into the muscle as needed for anaphylaxis.   fluticasone-salmeterol 115-21 MCG/ACT inhaler Commonly known as: ADVAIR HFA Inhale 2 puffs into the lungs at bedtime.   furosemide 10 MG/ML injection Commonly known as: LASIX Inject 6 mLs (60 mg total) into the vein every 12 (twelve) hours. Replaces: furosemide 20 MG tablet   heparin 22025 UT/250ML infusion Inject 1,550 Units/hr into the vein continuous.   hydroxyurea 500 MG capsule Commonly known as: HYDREA Take 4 capsules (2,000 mg total) by mouth daily. May take with food to minimize  GI side effects.   ipratropium-albuterol 0.5-2.5 (3) MG/3ML Soln Commonly known as: DUONEB Take 3 mLs by nebulization every 2 (two) hours as needed.   ipratropium-albuterol 0.5-2.5 (3) MG/3ML Soln Commonly known as: DUONEB Take 3 mLs by nebulization 2 (two) times daily.   levofloxacin 750 MG tablet Commonly known as: LEVAQUIN Take 1 tablet (750 mg total) by mouth daily.   lidocaine 5 % Commonly known as: LIDODERM Place 1 patch onto the skin daily. Remove & Discard patch within 12 hours or as directed by MD    metoprolol tartrate 25 MG tablet Commonly known as: LOPRESSOR Take 1 tablet (25 mg total) by mouth 2 (two) times daily.   morphine (PF) 2 MG/ML injection Inject 1 mL (2 mg total) into the vein every 3 (three) hours as needed (pain not improved by PO pain meds).   MOVE FREE PO Take 1 tablet by mouth daily.   nitroGLYCERIN 0.4 MG SL tablet Commonly known as: NITROSTAT Place 1 tablet (0.4 mg total) under the tongue every 5 (five) minutes as needed for chest pain.   ondansetron 4 MG/2ML Soln injection Commonly known as: ZOFRAN Inject 2 mLs (4 mg total) into the vein every 6 (six) hours as needed for refractory nausea / vomiting.   pantoprazole 40 MG tablet Commonly known as: PROTONIX Take 1 tablet (40 mg total) by mouth daily. Replaces: esomeprazole 20 MG capsule   polyethylene glycol 17 g packet Commonly known as: MIRALAX / GLYCOLAX Take 17 g by mouth daily as needed for mild constipation.   potassium chloride SA 20 MEQ tablet Commonly known as: KLOR-CON M Take 2 tablets (40 mEq total) by mouth 2 (two) times daily. What changed:  medication strength how much to take when to take this reasons to take this   PROBIOTIC PO Take 1 capsule by mouth daily.   prochlorperazine 10 MG/2ML injection Commonly known as: COMPAZINE Inject 1 mL (5 mg total) into the vein every 6 (six) hours as needed.   sodium chloride flush 0.9 % Soln Commonly known as: NS Inject 3 mLs into the vein every 12 (twelve) hours.   traMADol 50 MG tablet Commonly known as: ULTRAM Take 1 tablet (50 mg total) by mouth every 6 (six) hours as needed for moderate pain (pain score 4-6).   VITAMIN B-12 PO Take 1 tablet by mouth daily.   Vitamin D3 125 MCG (5000 UT) Caps Take 5,000 Units by mouth daily.            Discharge Exam: Filed Weights   02/19/23 0355 02/20/23 0400 02/21/23 0513  Weight: 95 kg 95.3 kg 85 kg    General: Pt is alert, awake, not in acute distress, lying flat, on O2 but in  no respiratory distress Cardiovascular: RRR, nl S1-S2, no murmurs appreciated.   No LE edema.   Respiratory: Normal respiratory rate and rhythm.  CTAB without rales or wheezes. Abdominal: Abdomen soft and non-tender.  No distension or HSM.   Neuro/Psych: Strength symmetric in upper and lower extremities.  Judgment and insight appear slightly impaired.   Condition at discharge: poor  The results of significant diagnostics from this hospitalization (including imaging, microbiology, ancillary and laboratory) are listed below for reference.   Imaging Studies: CT HEAD WO CONTRAST ( ) Result Date: 02/21/2023 CLINICAL DATA:  Mental status change of unknown cause EXAM: CT HEAD WITHOUT CONTRAST TECHNIQUE: Contiguous axial images were obtained from the base of the skull through the vertex without intravenous contrast. RADIATION DOSE REDUCTION: This exam  was performed according to the departmental dose-optimization program which includes automated exposure control, adjustment of the mA and/or kV according to patient size and/or use of iterative reconstruction technique. COMPARISON:  04/18/2017 FINDINGS: Brain: Age related cerebral and cerebellar volume loss. No evidence of old or acute focal infarction, mass lesion, hemorrhage, hydrocephalus or extra-axial collection. Vascular: There is atherosclerotic calcification of the major vessels at the base of the brain. Skull: Negative Sinuses/Orbits: Clear/normal Other: None IMPRESSION: No acute CT finding. Age related cerebral and cerebellar volume loss. Atherosclerotic calcification of the major vessels at the base of the brain. Electronically Signed   By: Paulina Fusi M.D.   On: 02/21/2023 09:41   DG CHEST PORT 1 VIEW Result Date: 02/21/2023 CLINICAL DATA:  Shortness of breath EXAM: PORTABLE CHEST 1 VIEW COMPARISON:  02/20/2023 FINDINGS: Right Port-A-Cath remains in place, unchanged. Cardiomegaly with vascular congestion. Worsening interstitial and airspace  opacities bilaterally, likely edema. Layering bilateral pleural effusions. Left basilar atelectasis or infiltrate. IMPRESSION: 1. Cardiomegaly with vascular congestion. 2. Worsening edema. 3. Layering bilateral pleural effusions with left basilar atelectasis or infiltrate. Electronically Signed   By: Charlett Nose M.D.   On: 02/21/2023 00:36   DG Chest 2 View Result Date: 02/20/2023 CLINICAL DATA:  Hypoxia EXAM: CHEST - 2 VIEW COMPARISON:  02/19/2023 FINDINGS: Right Port-A-Cath remains in place, unchanged. Cardiomegaly, vascular congestion. Layering bilateral effusions. Bibasilar airspace opacities likely reflects atelectasis. IMPRESSION: 1. Cardiomegaly, vascular congestion. 2. Layering bilateral effusions with bibasilar atelectasis. Electronically Signed   By: Charlett Nose M.D.   On: 02/20/2023 21:35   DG Chest 1 View Result Date: 02/19/2023 CLINICAL DATA:  10028 Wheezing [id number] EXAM: CHEST  1 VIEW COMPARISON:  02/17/2023 chest radiograph. FINDINGS: Left rotated chest radiograph. Right internal jugular Port-A-Cath terminates at the cavoatrial junction. Stable cardiomediastinal silhouette with mild cardiomegaly. No pneumothorax. Small bilateral pleural effusions appear new, right greater than left. Diffuse prominence of the parahilar interstitial markings, worsened. Increased patchy bibasilar opacities. IMPRESSION: 1. Stable mild cardiomegaly. Worsened diffuse prominence of the parahilar interstitial markings, favor moderate cardiogenic pulmonary edema. 2. New small bilateral pleural effusions, right greater than left. 3. Increased patchy bibasilar opacities, favor atelectasis. Electronically Signed   By: Delbert Phenix M.D.   On: 02/19/2023 14:15   DG CHEST PORT 1 VIEW Result Date: 02/17/2023 CLINICAL DATA:  Shortness of breath EXAM: PORTABLE CHEST 1 VIEW COMPARISON:  02/14/2023 FINDINGS: Right-sided central venous port tip over the SVC. Stable cardiomediastinal silhouette. Emphysema. No acute airspace  disease, pleural effusion or pneumothorax. IMPRESSION: No active disease. Emphysema. Electronically Signed   By: Jasmine Pang M.D.   On: 02/17/2023 15:29   IR IMAGING GUIDED PORT INSERTION Result Date: 02/17/2023 INDICATION: 77 year old female for a new diagnosis of leukocytosis concerning for leukemia. She presents for combined bone marrow biopsy and port catheter placement. EXAM: CT GUIDED BONE MARROW ASPIRATION AND CORE BIOPSY IR PORT PLACEMENT Interventional Radiologist:  Sterling Big, MD MEDICATIONS: None. ANESTHESIA/SEDATION: Moderate (conscious) sedation was employed during this procedure. A total of 3.5 milligrams versed and 150 micrograms fentanyl were administered intravenously by the Radiology nurse. The patient's level of consciousness and vital signs were monitored continuously by radiology nursing throughout the procedure under my direct supervision. Total monitored sedation time: 60 minutes FLUOROSCOPY: Radiation exposure index: 11.8 mGy reference air kerma COMPLICATIONS: None immediate. Estimated blood loss: <25 mL PROCEDURE: Informed written consent was obtained from the patient after a thorough discussion of the procedural risks, benefits and alternatives. All questions were addressed. Maximal  Sterile Barrier Technique was utilized including caps, mask, sterile gowns, sterile gloves, sterile drape, hand hygiene and skin antiseptic. A timeout was performed prior to the initiation of the procedure. BONE MARROW BIOPSY The patient was positioned prone and fluoroscopy was performed of the pelvis to demonstrate the iliac marrow spaces. Maximal barrier sterile technique utilized including caps, mask, sterile gowns, sterile gloves, large sterile drape, hand hygiene, and betadine prep. Under sterile conditions and local anesthesia, an 11 gauge coaxial bone biopsy needle was advanced into the left iliac marrow space. Needle position was confirmed with CT imaging. Initially, bone marrow aspiration  was performed. Next, the 11 gauge outer cannula was utilized to obtain a left iliac bone marrow core biopsy. Needle was removed. Hemostasis was obtained with compression. The patient tolerated the procedure well. Samples were prepared with the cytotechnologist. PORT PLACEMENT The patient was repositioned into the supine position. The right neck and chest were sterilely prepped in the standard fashion using chlorhexidine skin prep. Ultrasound demonstrated patency of the right internal jugular vein, and this was documented with an image. Under real-time ultrasound guidance, this vein was accessed with a 21 gauge micropuncture needle and image documentation was performed. A small dermatotomy was made at the access site with an 11 scalpel. A 0.018" wire was advanced into the SVC and the access needle exchanged for a 7F micropuncture vascular sheath. The 0.018" wire was then removed and a 0.035" wire advanced into the IVC. An appropriate location for the subcutaneous reservoir was selected below the clavicle and an incision was made through the skin and underlying soft tissues. The subcutaneous tissues were then dissected using a combination of blunt and sharp surgical technique and a pocket was formed. A single lumen power injectable portacatheter (Bard Clear Vue) was then tunneled through the subcutaneous tissues from the pocket to the dermatotomy and the port reservoir placed within the subcutaneous pocket. The venous access site was then serially dilated and a peel away vascular sheath placed over the wire. The wire was removed and the port catheter advanced into position under fluoroscopic guidance. The catheter tip is positioned in the superior cavoatrial junction. This was documented with a spot image. The portacatheter was then tested and found to flush and aspirate well. The port was flushed with saline followed by 100 units/mL heparinized saline. The pocket was then closed in two layers using first subdermal  inverted interrupted absorbable sutures followed by a running subcuticular suture. The epidermis was then sealed with Dermabond. The dermatotomy at the venous access site was also closed with Dermabond. IMPRESSION: 1. Successful left iliac bone marrow aspirate and core biopsy 2. Successful right IJ approach port catheter placement (Bard Clear Vue). The catheter tubing is at the superior cavoatrial junction and the catheter is ready for immediate use. Electronically Signed   By: Malachy Moan M.D.   On: 02/17/2023 13:13   IR BONE MARROW BIOPSY & ASPIRATION Result Date: 02/17/2023 INDICATION: 77 year old female for a new diagnosis of leukocytosis concerning for leukemia. She presents for combined bone marrow biopsy and port catheter placement. EXAM: CT GUIDED BONE MARROW ASPIRATION AND CORE BIOPSY IR PORT PLACEMENT Interventional Radiologist:  Sterling Big, MD MEDICATIONS: None. ANESTHESIA/SEDATION: Moderate (conscious) sedation was employed during this procedure. A total of 3.5 milligrams versed and 150 micrograms fentanyl were administered intravenously by the Radiology nurse. The patient's level of consciousness and vital signs were monitored continuously by radiology nursing throughout the procedure under my direct supervision. Total monitored sedation time: 60 minutes  FLUOROSCOPY: Radiation exposure index: 11.8 mGy reference air kerma COMPLICATIONS: None immediate. Estimated blood loss: <25 mL PROCEDURE: Informed written consent was obtained from the patient after a thorough discussion of the procedural risks, benefits and alternatives. All questions were addressed. Maximal Sterile Barrier Technique was utilized including caps, mask, sterile gowns, sterile gloves, sterile drape, hand hygiene and skin antiseptic. A timeout was performed prior to the initiation of the procedure. BONE MARROW BIOPSY The patient was positioned prone and fluoroscopy was performed of the pelvis to demonstrate the iliac  marrow spaces. Maximal barrier sterile technique utilized including caps, mask, sterile gowns, sterile gloves, large sterile drape, hand hygiene, and betadine prep. Under sterile conditions and local anesthesia, an 11 gauge coaxial bone biopsy needle was advanced into the left iliac marrow space. Needle position was confirmed with CT imaging. Initially, bone marrow aspiration was performed. Next, the 11 gauge outer cannula was utilized to obtain a left iliac bone marrow core biopsy. Needle was removed. Hemostasis was obtained with compression. The patient tolerated the procedure well. Samples were prepared with the cytotechnologist. PORT PLACEMENT The patient was repositioned into the supine position. The right neck and chest were sterilely prepped in the standard fashion using chlorhexidine skin prep. Ultrasound demonstrated patency of the right internal jugular vein, and this was documented with an image. Under real-time ultrasound guidance, this vein was accessed with a 21 gauge micropuncture needle and image documentation was performed. A small dermatotomy was made at the access site with an 11 scalpel. A 0.018" wire was advanced into the SVC and the access needle exchanged for a 44F micropuncture vascular sheath. The 0.018" wire was then removed and a 0.035" wire advanced into the IVC. An appropriate location for the subcutaneous reservoir was selected below the clavicle and an incision was made through the skin and underlying soft tissues. The subcutaneous tissues were then dissected using a combination of blunt and sharp surgical technique and a pocket was formed. A single lumen power injectable portacatheter (Bard Clear Vue) was then tunneled through the subcutaneous tissues from the pocket to the dermatotomy and the port reservoir placed within the subcutaneous pocket. The venous access site was then serially dilated and a peel away vascular sheath placed over the wire. The wire was removed and the port  catheter advanced into position under fluoroscopic guidance. The catheter tip is positioned in the superior cavoatrial junction. This was documented with a spot image. The portacatheter was then tested and found to flush and aspirate well. The port was flushed with saline followed by 100 units/mL heparinized saline. The pocket was then closed in two layers using first subdermal inverted interrupted absorbable sutures followed by a running subcuticular suture. The epidermis was then sealed with Dermabond. The dermatotomy at the venous access site was also closed with Dermabond. IMPRESSION: 1. Successful left iliac bone marrow aspirate and core biopsy 2. Successful right IJ approach port catheter placement (Bard Clear Vue). The catheter tubing is at the superior cavoatrial junction and the catheter is ready for immediate use. Electronically Signed   By: Malachy Moan M.D.   On: 02/17/2023 13:13   CT Angio Chest PE W and/or Wo Contrast Result Date: 02/14/2023 CLINICAL DATA:  Right leg pain for 1 week that has gotten progressively worse. Lasix not decreasing swelling. PE suspected. EXAM: CT ANGIOGRAPHY CHEST WITH CONTRAST CT ANGIOGRAPHY OF ABDOMINAL AORTA WITH ILIOFEMORAL RUNOFF TECHNIQUE: Multidetector CT imaging of the abdomen, pelvis and lower extremities was performed using the standard protocol during bolus  administration of intravenous contrast. Multiplanar CT image reconstructions and MIPs were obtained to evaluate the vascular anatomy. Multidetector CT imaging of the chest was performed using the standard protocol during bolus administration of intravenous contrast. Multiplanar CT image reconstructions and MIPs were obtained to evaluate the vascular anatomy. RADIATION DOSE REDUCTION: This exam was performed according to the departmental dose-optimization program which includes automated exposure control, adjustment of the mA and/or kV according to patient size and/or use of iterative reconstruction  technique. CONTRAST:  OMNIPAQUE IOHEXOL 350 MG/ML SOLN COMPARISON:  Same day chest radiograph; CT abdomen and pelvis 01/13/2023 and CT chest 12/30/2022 FINDINGS: Cardiovascular: No pericardial effusion. No pulmonary embolism. Normal caliber aorta. Aortic atherosclerosis. Mediastinum/Nodes: Trachea and esophagus are unremarkable. New mediastinal and right hilar lymphadenopathy. For example 1.0 cm pretracheal node on series 1/image 33 and 1.0 cm right hilar node on series 1/image 41. Lungs/Pleura: Emphysema. Diffuse interlobular septal thickening. There are new bilateral patchy ground-glass opacities in the dependent lungs bilaterally. No pleural effusion or pneumothorax. Musculoskeletal: No acute fracture. Review of the MIP images confirms the above findings. VASCULAR Aorta: No aneurysm or dissection. Scattered mixed density atherosclerotic plaque without hemodynamically significant stenosis. Celiac: Severe narrowing at the origin of the celiac axis. SMA: Patent. Renals: Patent left renal artery. Moderate narrowing at the origin of the right renal artery. IMA: Patent. RIGHT Lower Extremity Inflow: Scattered atherosclerotic plaque without hemodynamically significant narrowing. No aneurysm or dissection. Outflow: Occlusion of the distal superficial femoral artery with reconstitution of the popliteal artery. Runoff: Three-vessel runoff of the right lower extremity. The distal anterior tibial and peroneal arteries are atretic. Patent dorsalis pedis. LEFT Lower Extremity Inflow: Scattered atherosclerotic plaque without hemodynamically significant narrowing. No aneurysm or dissection. Outflow: Patent.  No aneurysm or dissection. Runoff: Patent three vessel runoff to the ankle. Patent dorsalis pedis. Veins: No obvious venous abnormality within the limitations of this arterial phase study. Review of the MIP images confirms the above findings. NON-VASCULAR Hepatobiliary: No acute abnormality. Pancreas: Unremarkable.  Spleen: Unremarkable. Adrenals/Urinary Tract: Normal adrenal glands. Geographic hypoattenuation in the anterior left kidney is unchanged from 01/13/2023 and compatible with infarct. No urinary calculi or hydronephrosis. Unremarkable bladder. Stomach/Bowel: Normal caliber large and small bowel. Colonic diverticulosis without diverticulitis. Appendix and stomach are within normal limits. Lymphatic: No lymphadenopathy. Reproductive: Hysterectomy. Other: No free intraperitoneal fluid or air. Musculoskeletal: No acute fracture. IMPRESSION: 1. Occlusion of the distal right superficial femoral artery with reconstitution of the popliteal artery. 2. No evidence of pulmonary embolism. 3. New bilateral patchy ground-glass opacities in the dependent lungs bilaterally, likely infectious/inflammatory. 4. New mediastinal and right hilar lymphadenopathy, favored reactive. 5. Unchanged left renal infarct. Aortic Atherosclerosis (ICD10-I70.0) and Emphysema (ICD10-J43.9). Critical Value/emergent results were called by telephone at the time of interpretation on 02/14/2023 at 6:50 pm to provider Dr. Wyn Forster, who verbally acknowledged these results. Electronically Signed   By: Minerva Fester M.D.   On: 02/14/2023 18:51   CT Angio Aortobifemoral W and/or Wo Contrast Result Date: 02/14/2023 CLINICAL DATA:  Right leg pain for 1 week that has gotten progressively worse. Lasix not decreasing swelling. PE suspected. EXAM: CT ANGIOGRAPHY CHEST WITH CONTRAST CT ANGIOGRAPHY OF ABDOMINAL AORTA WITH ILIOFEMORAL RUNOFF TECHNIQUE: Multidetector CT imaging of the abdomen, pelvis and lower extremities was performed using the standard protocol during bolus administration of intravenous contrast. Multiplanar CT image reconstructions and MIPs were obtained to evaluate the vascular anatomy. Multidetector CT imaging of the chest was performed using the standard protocol during bolus administration of intravenous  contrast. Multiplanar CT image  reconstructions and MIPs were obtained to evaluate the vascular anatomy. RADIATION DOSE REDUCTION: This exam was performed according to the departmental dose-optimization program which includes automated exposure control, adjustment of the mA and/or kV according to patient size and/or use of iterative reconstruction technique. CONTRAST:  OMNIPAQUE IOHEXOL 350 MG/ML SOLN COMPARISON:  Same day chest radiograph; CT abdomen and pelvis 01/13/2023 and CT chest 12/30/2022 FINDINGS: Cardiovascular: No pericardial effusion. No pulmonary embolism. Normal caliber aorta. Aortic atherosclerosis. Mediastinum/Nodes: Trachea and esophagus are unremarkable. New mediastinal and right hilar lymphadenopathy. For example 1.0 cm pretracheal node on series 1/image 33 and 1.0 cm right hilar node on series 1/image 41. Lungs/Pleura: Emphysema. Diffuse interlobular septal thickening. There are new bilateral patchy ground-glass opacities in the dependent lungs bilaterally. No pleural effusion or pneumothorax. Musculoskeletal: No acute fracture. Review of the MIP images confirms the above findings. VASCULAR Aorta: No aneurysm or dissection. Scattered mixed density atherosclerotic plaque without hemodynamically significant stenosis. Celiac: Severe narrowing at the origin of the celiac axis. SMA: Patent. Renals: Patent left renal artery. Moderate narrowing at the origin of the right renal artery. IMA: Patent. RIGHT Lower Extremity Inflow: Scattered atherosclerotic plaque without hemodynamically significant narrowing. No aneurysm or dissection. Outflow: Occlusion of the distal superficial femoral artery with reconstitution of the popliteal artery. Runoff: Three-vessel runoff of the right lower extremity. The distal anterior tibial and peroneal arteries are atretic. Patent dorsalis pedis. LEFT Lower Extremity Inflow: Scattered atherosclerotic plaque without hemodynamically significant narrowing. No aneurysm or dissection. Outflow: Patent.  No  aneurysm or dissection. Runoff: Patent three vessel runoff to the ankle. Patent dorsalis pedis. Veins: No obvious venous abnormality within the limitations of this arterial phase study. Review of the MIP images confirms the above findings. NON-VASCULAR Hepatobiliary: No acute abnormality. Pancreas: Unremarkable. Spleen: Unremarkable. Adrenals/Urinary Tract: Normal adrenal glands. Geographic hypoattenuation in the anterior left kidney is unchanged from 01/13/2023 and compatible with infarct. No urinary calculi or hydronephrosis. Unremarkable bladder. Stomach/Bowel: Normal caliber large and small bowel. Colonic diverticulosis without diverticulitis. Appendix and stomach are within normal limits. Lymphatic: No lymphadenopathy. Reproductive: Hysterectomy. Other: No free intraperitoneal fluid or air. Musculoskeletal: No acute fracture. IMPRESSION: 1. Occlusion of the distal right superficial femoral artery with reconstitution of the popliteal artery. 2. No evidence of pulmonary embolism. 3. New bilateral patchy ground-glass opacities in the dependent lungs bilaterally, likely infectious/inflammatory. 4. New mediastinal and right hilar lymphadenopathy, favored reactive. 5. Unchanged left renal infarct. Aortic Atherosclerosis (ICD10-I70.0) and Emphysema (ICD10-J43.9). Critical Value/emergent results were called by telephone at the time of interpretation on 02/14/2023 at 6:50 pm to provider Dr. Wyn Forster, who verbally acknowledged these results. Electronically Signed   By: Minerva Fester M.D.   On: 02/14/2023 18:51   DG Chest Portable 1 View Result Date: 02/14/2023 CLINICAL DATA:  Right leg pain and swelling. EXAM: PORTABLE CHEST 1 VIEW COMPARISON:  12/30/2022. FINDINGS: Bilateral lungs appear hyperlucent with coarse bronchovascular markings, in keeping with COPD. There are atelectatic changes at the left lung base. Bilateral lungs otherwise appear clear. No overt pulmonary edema. No dense consolidation or lung collapse.  Bilateral costophrenic angles are clear. Stable cardio-mediastinal silhouette. No acute osseous abnormalities. The soft tissues are within normal limits. IMPRESSION: *No active disease.  COPD. Electronically Signed   By: Jules Schick M.D.   On: 02/14/2023 15:53   US Venous Img Lower Unilateral Right Result Date: 02/14/2023 CLINICAL DATA:  Right lower extremity pain and edema. EXAM: RIGHT LOWER EXTREMITY VENOUS DOPPLER ULTRASOUND TECHNIQUE: Gray-scale sonography  with graded compression, as well as color Doppler and duplex ultrasound were performed to evaluate the lower extremity deep venous systems from the level of the common femoral vein and including the common femoral, femoral, profunda femoral, popliteal and calf veins including the posterior tibial, peroneal and gastrocnemius veins when visible. The superficial great saphenous vein was also interrogated. Spectral Doppler was utilized to evaluate flow at rest and with distal augmentation maneuvers in the common femoral, femoral and popliteal veins. COMPARISON:  None Available. FINDINGS: Contralateral Common Femoral Vein: Respiratory phasicity is normal and symmetric with the symptomatic side. No evidence of thrombus. Normal compressibility. Common Femoral Vein: No evidence of thrombus. Normal compressibility, respiratory phasicity and response to augmentation. Saphenofemoral Junction: No evidence of thrombus. Normal compressibility and flow on color Doppler imaging. Profunda Femoral Vein: No evidence of thrombus. Normal compressibility and flow on color Doppler imaging. Femoral Vein: No evidence of thrombus. Normal compressibility, respiratory phasicity and response to augmentation. Popliteal Vein: No evidence of thrombus. Normal compressibility, respiratory phasicity and response to augmentation. Calf Veins: No evidence of thrombus. Normal compressibility and flow on color Doppler imaging. Superficial Great Saphenous Vein: No evidence of thrombus. Normal  compressibility. Venous Reflux:  None. Other Findings: No evidence of superficial thrombophlebitis or abnormal fluid collection. IMPRESSION: No evidence of right lower extremity deep venous thrombosis. Electronically Signed   By: Irish Lack M.D.   On: 02/14/2023 15:40    Microbiology: Results for orders placed or performed during the hospital encounter of 02/14/23  Culture, blood (Routine X 2) w Reflex to ID Panel     Status: None   Collection Time: 02/14/23 11:54 PM   Specimen: BLOOD  Result Value Ref Range Status   Specimen Description BLOOD RIGHT ANTECUBITAL  Final   Special Requests   Final    BOTTLES DRAWN AEROBIC AND ANAEROBIC Blood Culture adequate volume   Culture   Final    NO GROWTH 5 DAYS Performed at [facility], [address] [place], [place] [address]    Report Status 02/20/2023 FINAL  Final  Culture, blood (Routine X 2) w Reflex to ID Panel     Status: None   Collection Time: 02/14/23 11:54 PM   Specimen: BLOOD RIGHT HAND  Result Value Ref Range Status   Specimen Description   Final    BLOOD RIGHT HAND Performed at [facility], [address], [place], [place] [address]    Special Requests   Final    BOTTLES DRAWN AEROBIC AND ANAEROBIC Blood Culture adequate volume Performed at [facility], [address] [place], [place] [address]    Culture   Final    NO GROWTH 5 DAYS Performed at [facility], [address] [place], [place] [address]    Report Status 02/20/2023 FINAL  Final  Respiratory (~20 pathogens) panel by PCR     Status: None   Collection Time: 02/15/23 12:10 PM   Specimen: Nasopharyngeal Swab; Respiratory  Result Value Ref Range Status   Adenovirus NOT DETECTED NOT DETECTED Final   Coronavirus 229E NOT DETECTED NOT DETECTED Final    Comment: (NOTE) The Coronavirus on the Respiratory Panel, DOES NOT test for the novel  Coronavirus (2019 nCoV)    Coronavirus HKU1 NOT DETECTED NOT DETECTED Final   Coronavirus NL63 NOT DETECTED  NOT DETECTED Final   Coronavirus OC43 NOT DETECTED NOT DETECTED Final   Metapneumovirus NOT DETECTED NOT DETECTED Final   Rhinovirus / Enterovirus NOT DETECTED NOT DETECTED Final  Influenza A NOT DETECTED NOT DETECTED Final   Influenza B NOT DETECTED NOT DETECTED Final   Parainfluenza Virus 1 NOT DETECTED NOT DETECTED Final   Parainfluenza Virus 2 NOT DETECTED NOT DETECTED Final   Parainfluenza Virus 3 NOT DETECTED NOT DETECTED Final   Parainfluenza Virus 4 NOT DETECTED NOT DETECTED Final   Respiratory Syncytial Virus NOT DETECTED NOT DETECTED Final   Bordetella pertussis NOT DETECTED NOT DETECTED Final   Bordetella Parapertussis NOT DETECTED NOT DETECTED Final   Chlamydophila pneumoniae NOT DETECTED NOT DETECTED Final   Mycoplasma pneumoniae NOT DETECTED NOT DETECTED Final    Comment: Performed at [facility], [address], [place], [place] [address]  Expectorated Sputum Assessment w Gram Stain, Rflx to Resp Cult     Status: None   Collection Time: 02/18/23 10:45 AM   Specimen: Sputum  Result Value Ref Range Status   Specimen Description SPUTUM  Final   Special Requests NONE  Final   Sputum evaluation   Final    THIS SPECIMEN IS ACCEPTABLE FOR SPUTUM CULTURE Performed at [facility], [address] [place], [place] [address]    Report Status 02/18/2023 FINAL  Final  Culture, Respiratory w Gram Stain     Status: None (Preliminary result)   Collection Time: 02/18/23 10:45 AM   Specimen: SPU  Result Value Ref Range Status   Specimen Description SPUTUM  Final   Special Requests NONE Reflexed from [id number]  Final   Gram Stain   Final    FEW GRAM POSITIVE COCCI FEW YEAST FEW GRAM POSITIVE RODS RARE GRAM NEGATIVE RODS    Culture   Final    RARE GROUP B STREP(S.AGALACTIAE)ISOLATED TESTING AGAINST S. AGALACTIAE NOT ROUTINELY PERFORMED DUE TO PREDICTABILITY OF AMP/PEN/VAN SUSCEPTIBILITY. Performed at [facility], [address] [place], [place] [address]     Report Status PENDING  Incomplete  Culture, blood (Routine X 2) w Reflex to ID Panel     Status: None (Preliminary result)   Collection Time: 02/18/23 11:12 AM   Specimen: BLOOD LEFT ARM  Result Value Ref Range Status   Specimen Description BLOOD LEFT ARM  Final   Special Requests   Final    BOTTLES DRAWN AEROBIC AND ANAEROBIC Blood Culture results may not be optimal due to an inadequate volume of blood received in culture bottles   Culture   Final    NO GROWTH 3 DAYS Performed at [facility], [address], [place], [place] [address]    Report Status PENDING  Incomplete  Culture, blood (Routine X 2) w Reflex to ID Panel     Status: None (Preliminary result)   Collection Time: 02/18/23 11:18 AM   Specimen: BLOOD RIGHT HAND  Result Value Ref Range Status   Specimen Description BLOOD RIGHT HAND  Final   Special Requests   Final    BOTTLES DRAWN AEROBIC AND ANAEROBIC Blood Culture results may not be optimal due to an inadequate volume of blood received in culture bottles   Culture   Final    NO GROWTH 3 DAYS Performed at [facility], [address] [place], [place] [address]    Report Status PENDING  Incomplete  MRSA Next Gen by PCR, Nasal     Status: None   Collection Time: 02/19/23  5:15 AM   Specimen: Nasal Mucosa; Nasal Swab  Result Value Ref Range Status   MRSA by PCR Next Gen NOT DETECTED NOT DETECTED  Final    Comment: (NOTE) The GeneXpert MRSA Assay (FDA approved for NASAL specimens only), is one component of a comprehensive MRSA colonization surveillance program. It is not intended to diagnose MRSA infection nor to guide or monitor treatment for MRSA infections. Test performance is not FDA approved in patients less than 6 years old. Performed at [facility], [address], [place], [place] [address]     Labs: CBC: Recent Labs  Lab 02/17/23 0350 02/18/23 0515 02/19/23 0500 02/20/23 0500 02/21/23 0353  WBC 84.1* 70.0* 62.8* 58.0* 67.9*   NEUTROABS 51.1* 39.0* 45.0* 42.1* 42.2*  HGB 10.1* 9.1* 8.8* 8.6* 9.2*  HCT 32.3* 29.7* 28.6* 27.4* 29.1*  MCV 97.3 99.7 99.3 96.5 95.4  PLT 54* 42* 38* 38* 40*   Basic Metabolic Panel: Recent Labs  Lab 02/14/23 2041 02/15/23 0605 02/16/23 0324 02/17/23 0350 02/18/23 0515 02/19/23 0500 02/20/23 0500 02/21/23 0353  NA  --  138 137 140 140 140 139 140  K  --  3.9 3.0* 4.3 3.7 4.2 3.1* 3.5  CL  --  105 106 110 110 110 105 100  CO2  --  24 25 23 23  21* 23 28  GLUCOSE  --  116* 127* 119* 109* 133* 137* 131*  BUN  --  6* 5* 9 8 9 13 16   CREATININE  --  0.95 1.07* 1.25* 1.20* 0.99 1.27* 1.19*  CALCIUM  --  7.9* 7.7* 8.1* 7.6* 8.0* 8.0* 8.3*  MG 1.7 1.6* 2.2  --   --   --   --   --   PHOS 2.0* 2.7  --   --   --   --   --   --    Liver Function Tests: Recent Labs  Lab 02/16/23 0324 02/17/23 0350 02/18/23 0515 02/19/23 0500 02/20/23 0500  AST 19 19 21 28 23   ALT 12 15 16 20 20   ALKPHOS 101 114 106 116 125  BILITOT 0.7 0.8 0.7 0.9 1.0  PROT 5.7* 6.3* 5.5* 5.5* 5.5*  ALBUMIN 2.6* 2.6* 2.4* 2.3* 2.2*   CBG: No results for input(s): "GLUCAP" in the last 168 hours.  Discharge time spent: approximately 35 minutes spent on discharge counseling, evaluation of patient on day of discharge, and coordination of discharge planning with nursing, social work, pharmacy and case management  Signed: Alberteen Sam, MD [facility] 02/21/2023

## 2023-02-22 DIAGNOSIS — C9200 Acute myeloblastic leukemia, not having achieved remission: Secondary | ICD-10-CM | POA: Diagnosis not present

## 2023-02-23 ENCOUNTER — Encounter (HOSPITAL_COMMUNITY): Payer: Self-pay | Admitting: Hematology and Oncology

## 2023-02-23 DIAGNOSIS — C9200 Acute myeloblastic leukemia, not having achieved remission: Secondary | ICD-10-CM | POA: Diagnosis not present

## 2023-02-23 DIAGNOSIS — I998 Other disorder of circulatory system: Secondary | ICD-10-CM | POA: Diagnosis not present

## 2023-02-23 DIAGNOSIS — J9601 Acute respiratory failure with hypoxia: Secondary | ICD-10-CM | POA: Diagnosis not present

## 2023-02-23 DIAGNOSIS — R579 Shock, unspecified: Secondary | ICD-10-CM | POA: Diagnosis not present

## 2023-02-23 DIAGNOSIS — I70221 Atherosclerosis of native arteries of extremities with rest pain, right leg: Secondary | ICD-10-CM | POA: Diagnosis not present

## 2023-02-23 LAB — CULTURE, BLOOD (ROUTINE X 2)
Culture: NO GROWTH
Culture: NO GROWTH

## 2023-02-24 ENCOUNTER — Encounter (HOSPITAL_COMMUNITY): Payer: Self-pay | Admitting: Hematology and Oncology

## 2023-02-24 DIAGNOSIS — R579 Shock, unspecified: Secondary | ICD-10-CM | POA: Diagnosis not present

## 2023-02-24 DIAGNOSIS — C9200 Acute myeloblastic leukemia, not having achieved remission: Secondary | ICD-10-CM | POA: Diagnosis not present

## 2023-02-25 DIAGNOSIS — C9200 Acute myeloblastic leukemia, not having achieved remission: Secondary | ICD-10-CM | POA: Diagnosis not present

## 2023-02-25 DIAGNOSIS — R579 Shock, unspecified: Secondary | ICD-10-CM | POA: Diagnosis not present

## 2023-02-26 DIAGNOSIS — C9200 Acute myeloblastic leukemia, not having achieved remission: Secondary | ICD-10-CM | POA: Diagnosis not present

## 2023-02-26 DIAGNOSIS — R579 Shock, unspecified: Secondary | ICD-10-CM | POA: Diagnosis not present

## 2023-02-27 ENCOUNTER — Encounter (HOSPITAL_COMMUNITY): Payer: Self-pay | Admitting: Hematology and Oncology

## 2023-03-03 ENCOUNTER — Ambulatory Visit: Payer: Medicare Other | Admitting: Student

## 2023-03-07 ENCOUNTER — Encounter: Payer: Medicare Other | Admitting: Vascular Surgery

## 2023-04-01 DEATH — deceased

## 2023-04-03 NOTE — Progress Notes (Deleted)
 [name] MRN: [id number] DOB: August 07, 1945 Sex: female  REASON FOR VISIT: Hospital follow-up  HPI: Lisa [name] is a 78 y.o. female with multiple comorbidities including recent diagnosis of AML that presents for hospital follow-up.  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  At the time her embolic workup was unremarkable and she was discharged on DOAC.  She then represented on 02/15/2023 with embolic event to the right SFA.  At the time she was diagnosed with AML and transferred to [facility] for care.  She did undergo right lower extremity revascularization with thrombectomy and SFA angioplasty on 02/23/2023.  Past Medical History:  Diagnosis Date   Asthma    COPD (chronic obstructive pulmonary disease) (HCC)    Coronary artery disease    Cough     ALL MY LIFE    Dyspnea    GERD (gastroesophageal reflux disease)    History of blood clots    patient had a blood clot in her kidney 01/03/2023.  pt is on Eliquis    Hypertension    NSTEMI (non-ST elevated myocardial infarction) (HCC) 09/01/2016   DES to Cx/OM bifurcation   Unstable angina (HCC) 08/2016    Past Surgical History:  Procedure Laterality Date   ABDOMINAL HYSTERECTOMY     COLONOSCOPY WITH PROPOFOL  N/A 01/16/2023   Procedure: COLONOSCOPY WITH PROPOFOL ;  Surgeon: Cindie Carlin POUR, DO;  Location: [facility];  Service: Endoscopy;  Laterality: N/A;  845am, asa 3   CORONARY STENT INTERVENTION  09/05/2016   Successful complex PCI of the circumflex/OM bifurcation using a Synergy DES   CORONARY STENT INTERVENTION N/A 09/05/2016   Procedure: Coronary Stent Intervention;  Surgeon: Wonda Sharper, MD;  Location: [facility] INVASIVE CV LAB;  Service: Cardiovascular;  Laterality: N/A;   IR BONE MARROW BIOPSY & ASPIRATION  02/17/2023   IR IMAGING GUIDED PORT INSERTION  02/17/2023   LEFT HEART CATH AND CORONARY ANGIOGRAPHY N/A 09/02/2016   Procedure: Left Heart Cath and Coronary Angiography;  Surgeon: Dann Candyce RAMAN, MD;  Location: [facility] INVASIVE CV LAB;  Service: Cardiovascular;  Laterality: N/A;   POLYPECTOMY  01/16/2023   Procedure: POLYPECTOMY;  Surgeon: Cindie Carlin POUR, DO;  Location: [facility];  Service: Endoscopy;;    Family History  Problem Relation Age of Onset   Asthma Mother    Heart attack Father 79   Heart attack Son 100   Prostate cancer Brother     SOCIAL HISTORY: Social History   Tobacco Use   Smoking status: Every Day    Current packs/day: 1.00    Average packs/day: 1 pack/day for 50.0 years (50.0 ttl pk-yrs)    Types: Cigarettes   Smokeless tobacco: Never  Substance Use Topics   Alcohol  use: Not Currently    Comment: occasional    Allergies  Allergen Reactions   Brilinta  [Ticagrelor ] Shortness Of Breath   Penicillins Anaphylaxis    immediate rash, facial/tongue/throat swelling, SOB or lightheadedness with hypotension severe rash involving mucus membranes or skin necrosis. PharmD confirmed allergy 02/18/23 - no cephalosporins tried in the past.   Ranexa  [Ranolazine ] Shortness Of Breath   Sulfa Antibiotics Anaphylaxis and Rash    Blistering rash on hands and feet   Crestor  [Rosuvastatin ] Other (See Comments)    Myalgias    Current Outpatient Medications  Medication Sig Dispense Refill   acetaminophen  (TYLENOL ) 500 MG tablet Take 1,000 mg by mouth every 6 (six) hours as needed for moderate pain.     allopurinol  (ZYLOPRIM ) 300 MG  tablet Take 1 tablet (300 mg total) by mouth daily.     budesonide  (PULMICORT ) 0.25 MG/2ML nebulizer solution Take 2 mLs (0.25 mg total) by nebulization 2 (two) times daily.     Chlorhexidine  Gluconate Cloth 2 % PADS Apply 6 each topically daily.     Cholecalciferol (VITAMIN D3) 125 MCG (5000 UT) CAPS Take 5,000 Units by mouth daily.     Coenzyme Q10 (COQ10) 100 MG CAPS Take 100 mg by mouth daily.     Cyanocobalamin (VITAMIN B-12 PO) Take 1 tablet by mouth daily.     diazepam  (VALIUM ) 2 MG tablet Take 2 mg by mouth daily as  needed for anxiety.     docusate sodium  (COLACE) 100 MG capsule Take 100 mg by mouth daily.     EPINEPHrine  0.3 mg/0.3 mL IJ SOAJ injection Inject 0.3 mg into the muscle as needed for anaphylaxis.     fluticasone -salmeterol (ADVAIR HFA) 115-21 MCG/ACT inhaler Inhale 2 puffs into the lungs at bedtime.     furosemide  (LASIX ) 10 MG/ML injection Inject 6 mLs (60 mg total) into the vein every 12 (twelve) hours.     Glucosamine-Chondroitin (MOVE FREE PO) Take 1 tablet by mouth daily.     heparin  25000 UT/250ML infusion Inject 1,550 Units/hr into the vein continuous.     hydroxyurea  (HYDREA ) 500 MG capsule Take 4 capsules (2,000 mg total) by mouth daily. May take with food to minimize GI side effects.     ipratropium-albuterol  (DUONEB) 0.5-2.5 (3) MG/3ML SOLN Take 3 mLs by nebulization every 2 (two) hours as needed.     ipratropium-albuterol  (DUONEB) 0.5-2.5 (3) MG/3ML SOLN Take 3 mLs by nebulization 2 (two) times daily.     levofloxacin  (LEVAQUIN ) 750 MG tablet Take 1 tablet (750 mg total) by mouth daily.     lidocaine  (LIDODERM ) 5 % Place 1 patch onto the skin daily. Remove & Discard patch within 12 hours or as directed by MD     metoprolol  tartrate (LOPRESSOR ) 25 MG tablet Take 1 tablet (25 mg total) by mouth 2 (two) times daily.     Morphine  Sulfate (MORPHINE , PF,) 2 MG/ML injection Inject 1 mL (2 mg total) into the vein every 3 (three) hours as needed (pain not improved by PO pain meds).     nitroGLYCERIN  (NITROSTAT ) 0.4 MG SL tablet Place 1 tablet (0.4 mg total) under the tongue every 5 (five) minutes as needed for chest pain. 25 tablet 1   ondansetron  (ZOFRAN ) 4 MG/2ML SOLN injection Inject 2 mLs (4 mg total) into the vein every 6 (six) hours as needed for refractory nausea / vomiting.     pantoprazole  (PROTONIX ) 40 MG tablet Take 1 tablet (40 mg total) by mouth daily.     polyethylene glycol (MIRALAX  / GLYCOLAX ) 17 g packet Take 17 g by mouth daily as needed for mild constipation.     potassium  chloride SA (KLOR-CON  M) 20 MEQ tablet Take 2 tablets (40 mEq total) by mouth 2 (two) times daily.     Probiotic Product (PROBIOTIC PO) Take 1 capsule by mouth daily.     prochlorperazine  (COMPAZINE ) 10 MG/2ML injection Inject 1 mL (5 mg total) into the vein every 6 (six) hours as needed.     sodium chloride  flush (NS) 0.9 % SOLN Inject 3 mLs into the vein every 12 (twelve) hours.     traMADol  (ULTRAM ) 50 MG tablet Take 1 tablet (50 mg total) by mouth every 6 (six) hours as needed for moderate pain (pain score  4-6).     No current facility-administered medications for this visit.    REVIEW OF SYSTEMS:  [X]  denotes positive finding, [ ]  denotes negative finding Cardiac  Comments:  Chest pain or chest pressure: ***   Shortness of breath upon exertion:    Short of breath when lying flat:    Irregular heart rhythm:        Vascular    Pain in calf, thigh, or hip brought on by ambulation:    Pain in feet at night that wakes you up from your sleep:     Blood clot in your veins:    Leg swelling:         Pulmonary    Oxygen  at home:    Productive cough:     Wheezing:         Neurologic    Sudden weakness in arms or legs:     Sudden numbness in arms or legs:     Sudden onset of difficulty speaking or slurred speech:    Temporary loss of vision in one eye:     Problems with dizziness:         Gastrointestinal    Blood in stool:     Vomited blood:         Genitourinary    Burning when urinating:     Blood in urine:        Psychiatric    Major depression:         Hematologic    Bleeding problems:    Problems with blood clotting too easily:        Skin    Rashes or ulcers:        Constitutional    Fever or chills:      PHYSICAL EXAM: There were no vitals filed for this visit.  GENERAL: The patient is a well-nourished female, in no acute distress. The vital signs are documented above. CARDIAC: There is a regular rate and rhythm.  VASCULAR: *** PULMONARY: There is good  air exchange bilaterally without wheezing or rales. ABDOMEN: Soft and non-tender with normal pitched bowel sounds.  MUSCULOSKELETAL: There are no major deformities or cyanosis. NEUROLOGIC: No focal weakness or paresthesias are detected. SKIN: There are no ulcers or rashes noted. PSYCHIATRIC: The patient has a normal affect.  DATA:   ***  Assessment/Plan:   78 y.o. female with multiple comorbidities including recent diagnosis of AML that presents for hospital follow-up.  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  At the time her embolic workup was unremarkable and she was discharged on DOAC.  She then represented on 02/15/2023 with embolic event to the right SFA.  At the time she was diagnosed with AML and transferred to [facility] for care.  She did undergo right lower extremity revascularization with thrombectomy and SFA angioplasty on 02/23/2023.   Lonni DOROTHA Gaskins, MD [facility] Office: [phone]

## 2023-04-04 ENCOUNTER — Encounter: Payer: Medicare Other | Admitting: Vascular Surgery

## 2023-04-13 ENCOUNTER — Encounter: Payer: Self-pay | Admitting: Internal Medicine

## 2023-04-25 ENCOUNTER — Ambulatory Visit: Payer: Medicare Other | Admitting: Internal Medicine
